# Patient Record
Sex: Male | Born: 1935
Health system: Southern US, Community
[De-identification: ages and names within clinical notes are randomized; demographics above are authoritative.]

## PROBLEM LIST (undated history)

## (undated) DIAGNOSIS — G459 Transient cerebral ischemic attack, unspecified: Secondary | ICD-10-CM

## (undated) DIAGNOSIS — E119 Type 2 diabetes mellitus without complications: Secondary | ICD-10-CM

## (undated) DIAGNOSIS — I639 Cerebral infarction, unspecified: Secondary | ICD-10-CM

## (undated) DIAGNOSIS — N289 Disorder of kidney and ureter, unspecified: Secondary | ICD-10-CM

## (undated) HISTORY — DX: Transient cerebral ischemic attack, unspecified: G45.9

## (undated) HISTORY — DX: Disorder of kidney and ureter, unspecified: N28.9

## (undated) HISTORY — DX: Cerebral infarction, unspecified: I63.9

---

## 2019-05-08 ENCOUNTER — Emergency Department (HOSPITAL_COMMUNITY): Payer: Medicare HMO

## 2019-05-08 ENCOUNTER — Other Ambulatory Visit: Payer: Self-pay

## 2019-05-08 ENCOUNTER — Inpatient Hospital Stay (HOSPITAL_COMMUNITY)
Admission: EM | Admit: 2019-05-08 | Discharge: 2019-05-10 | DRG: 683 | Disposition: A | Discharge status: Home / Self Care | Payer: Medicare HMO | Attending: Internal Medicine | Admitting: Internal Medicine

## 2019-05-08 ENCOUNTER — Observation Stay (HOSPITAL_COMMUNITY): Payer: Medicare HMO

## 2019-05-08 ENCOUNTER — Encounter (HOSPITAL_COMMUNITY): Payer: Self-pay

## 2019-05-08 DIAGNOSIS — N401 Enlarged prostate with lower urinary tract symptoms: Secondary | ICD-10-CM | POA: Diagnosis present

## 2019-05-08 DIAGNOSIS — N281 Cyst of kidney, acquired: Secondary | ICD-10-CM | POA: Diagnosis present

## 2019-05-08 DIAGNOSIS — E875 Hyperkalemia: Secondary | ICD-10-CM

## 2019-05-08 DIAGNOSIS — R531 Weakness: Secondary | ICD-10-CM | POA: Diagnosis not present

## 2019-05-08 DIAGNOSIS — Z7984 Long term (current) use of oral hypoglycemic drugs: Secondary | ICD-10-CM

## 2019-05-08 DIAGNOSIS — E1122 Type 2 diabetes mellitus with diabetic chronic kidney disease: Secondary | ICD-10-CM | POA: Diagnosis present

## 2019-05-08 DIAGNOSIS — Z9181 History of falling: Secondary | ICD-10-CM

## 2019-05-08 DIAGNOSIS — N179 Acute kidney failure, unspecified: Secondary | ICD-10-CM | POA: Diagnosis present

## 2019-05-08 DIAGNOSIS — Z79899 Other long term (current) drug therapy: Secondary | ICD-10-CM

## 2019-05-08 DIAGNOSIS — E871 Hypo-osmolality and hyponatremia: Secondary | ICD-10-CM | POA: Diagnosis present

## 2019-05-08 DIAGNOSIS — E872 Acidosis: Secondary | ICD-10-CM | POA: Diagnosis present

## 2019-05-08 DIAGNOSIS — Z841 Family history of disorders of kidney and ureter: Secondary | ICD-10-CM

## 2019-05-08 DIAGNOSIS — D631 Anemia in chronic kidney disease: Secondary | ICD-10-CM | POA: Diagnosis present

## 2019-05-08 DIAGNOSIS — Z20828 Contact with and (suspected) exposure to other viral communicable diseases: Secondary | ICD-10-CM | POA: Diagnosis present

## 2019-05-08 DIAGNOSIS — N184 Chronic kidney disease, stage 4 (severe): Secondary | ICD-10-CM | POA: Diagnosis present

## 2019-05-08 DIAGNOSIS — R3915 Urgency of urination: Secondary | ICD-10-CM | POA: Diagnosis present

## 2019-05-08 DIAGNOSIS — I129 Hypertensive chronic kidney disease with stage 1 through stage 4 chronic kidney disease, or unspecified chronic kidney disease: Secondary | ICD-10-CM | POA: Diagnosis present

## 2019-05-08 HISTORY — DX: Type 2 diabetes mellitus without complications: E11.9

## 2019-05-08 LAB — BASIC METABOLIC PANEL
Anion gap: 10 (ref 5–15)
Anion gap: 11 (ref 5–15)
BUN: 41 mg/dL — ABNORMAL HIGH (ref 8–23)
BUN: 39 mg/dL — ABNORMAL HIGH (ref 8–23)
CO2: 19 mmol/L — ABNORMAL LOW (ref 22–32)
CO2: 19 mmol/L — ABNORMAL LOW (ref 22–32)
Calcium: 9.4 mg/dL (ref 8.9–10.3)
Calcium: 9 mg/dL (ref 8.9–10.3)
Chloride: 101 mmol/L (ref 98–111)
Chloride: 100 mmol/L (ref 98–111)
Creatinine, Ser: 2.78 mg/dL — ABNORMAL HIGH (ref 0.61–1.24)
Creatinine, Ser: 2.75 mg/dL — ABNORMAL HIGH (ref 0.61–1.24)
GFR calc Af Amer: 23 mL/min — ABNORMAL LOW (ref >60)
GFR calc Af Amer: 24 mL/min — ABNORMAL LOW (ref >60)
GFR calc non Af Amer: 20 mL/min — ABNORMAL LOW (ref >60)
GFR calc non Af Amer: 20 mL/min — ABNORMAL LOW (ref >60)
Glucose, Bld: 92 mg/dL (ref 70–99)
Glucose, Bld: 127 mg/dL — ABNORMAL HIGH (ref 70–99)
Potassium: 6.9 mmol/L (ref 3.5–5.1)
Potassium: 6.1 mmol/L — ABNORMAL HIGH (ref 3.5–5.1)
Sodium: 130 mmol/L — ABNORMAL LOW (ref 135–145)
Sodium: 130 mmol/L — ABNORMAL LOW (ref 135–145)

## 2019-05-08 LAB — CBC WITH DIFFERENTIAL/PLATELET
Abs Immature Granulocytes: 0.04 10*3/uL (ref 0.00–0.07)
Basophils Absolute: 0 10*3/uL (ref 0.0–0.1)
Basophils Relative: 0 %
Eosinophils Absolute: 0 10*3/uL (ref 0.0–0.5)
Eosinophils Relative: 0 %
HCT: 33.6 % — ABNORMAL LOW (ref 39.0–52.0)
Hemoglobin: 10.7 g/dL — ABNORMAL LOW (ref 13.0–17.0)
Immature Granulocytes: 0 %
Lymphocytes Relative: 20 %
Lymphs Abs: 1.9 10*3/uL (ref 0.7–4.0)
MCH: 30.1 pg (ref 26.0–34.0)
MCHC: 31.8 g/dL (ref 30.0–36.0)
MCV: 94.4 fL (ref 80.0–100.0)
Monocytes Absolute: 0.8 10*3/uL (ref 0.1–1.0)
Monocytes Relative: 9 %
Neutro Abs: 6.5 10*3/uL (ref 1.7–7.7)
Neutrophils Relative %: 71 %
Platelets: 310 10*3/uL (ref 150–400)
RBC: 3.56 MIL/uL — ABNORMAL LOW (ref 4.22–5.81)
RDW: 13.4 % (ref 11.5–15.5)
WBC: 9.4 10*3/uL (ref 4.0–10.5)
nRBC: 0 % (ref 0.0–0.2)

## 2019-05-08 LAB — URINALYSIS, ROUTINE W REFLEX MICROSCOPIC
Bacteria, UA: NONE SEEN
Bilirubin Urine: NEGATIVE
Bilirubin Urine: NEGATIVE
Glucose, UA: NEGATIVE mg/dL
Glucose, UA: 150 mg/dL — AB
Hgb urine dipstick: NEGATIVE
Hgb urine dipstick: NEGATIVE
Ketones, ur: NEGATIVE mg/dL
Ketones, ur: NEGATIVE mg/dL
Leukocytes,Ua: NEGATIVE
Nitrite: NEGATIVE
Nitrite: NEGATIVE
Protein, ur: NEGATIVE mg/dL
Protein, ur: NEGATIVE mg/dL
Specific Gravity, Urine: 1.013 (ref 1.005–1.030)
Specific Gravity, Urine: 1.004 — ABNORMAL LOW (ref 1.005–1.030)
pH: 5 (ref 5.0–8.0)
pH: 7 (ref 5.0–8.0)

## 2019-05-08 LAB — COMPREHENSIVE METABOLIC PANEL
ALT: 17 U/L (ref 0–44)
AST: 18 U/L (ref 15–41)
Albumin: 4.4 g/dL (ref 3.5–5.0)
Alkaline Phosphatase: 50 U/L (ref 38–126)
Anion gap: 11 (ref 5–15)
BUN: 45 mg/dL — ABNORMAL HIGH (ref 8–23)
CO2: 20 mmol/L — ABNORMAL LOW (ref 22–32)
Calcium: 9.5 mg/dL (ref 8.9–10.3)
Chloride: 96 mmol/L — ABNORMAL LOW (ref 98–111)
Creatinine, Ser: 3.02 mg/dL — ABNORMAL HIGH (ref 0.61–1.24)
GFR calc Af Amer: 21 mL/min — ABNORMAL LOW (ref >60)
GFR calc non Af Amer: 18 mL/min — ABNORMAL LOW (ref >60)
Glucose, Bld: 109 mg/dL — ABNORMAL HIGH (ref 70–99)
Potassium: 7.3 mmol/L (ref 3.5–5.1)
Sodium: 127 mmol/L — ABNORMAL LOW (ref 135–145)
Total Bilirubin: 0.5 mg/dL (ref 0.3–1.2)
Total Protein: 7.3 g/dL (ref 6.5–8.1)

## 2019-05-08 LAB — HEMOGLOBIN A1C
Hgb A1c MFr Bld: 7.3 % — ABNORMAL HIGH (ref 4.8–5.6)
Mean Plasma Glucose: 162.81 mg/dL

## 2019-05-08 LAB — GLUCOSE, CAPILLARY
Glucose-Capillary: 66 mg/dL — ABNORMAL LOW (ref 70–99)
Glucose-Capillary: 85 mg/dL (ref 70–99)

## 2019-05-08 LAB — SODIUM, URINE, RANDOM: Sodium, Ur: 23 mmol/L

## 2019-05-08 LAB — CREATININE, URINE, RANDOM: Creatinine, Urine: 178.15 mg/dL

## 2019-05-08 LAB — OSMOLALITY, URINE: Osmolality, Ur: 190 mOsm/kg — ABNORMAL LOW (ref 300–900)

## 2019-05-08 LAB — SARS CORONAVIRUS 2 BY RT PCR (HOSPITAL ORDER, PERFORMED IN ~~LOC~~ HOSPITAL LAB): SARS Coronavirus 2: NEGATIVE

## 2019-05-08 LAB — CBG MONITORING, ED: Glucose-Capillary: 112 mg/dL — ABNORMAL HIGH (ref 70–99)

## 2019-05-08 IMAGING — CT CT HEAD WITHOUT CONTRAST
3 series · 15 of 47 positions shown, 18 images · non-contrast
Comparison: None.

CLINICAL DATA: Altered level of consciousness, dizzy, noticed by
daughter. No injury.

EXAM:
CT HEAD WITHOUT CONTRAST
TECHNIQUE: Contiguous axial images were obtained from the base of the skull
through the vertex without intravenous contrast.

[Series 2: head wo · axial · 0.47mm/px · z∈[+1534,+1664]mm · 9 of 32 slices shown, 12 images]
[im 3/32  brain]
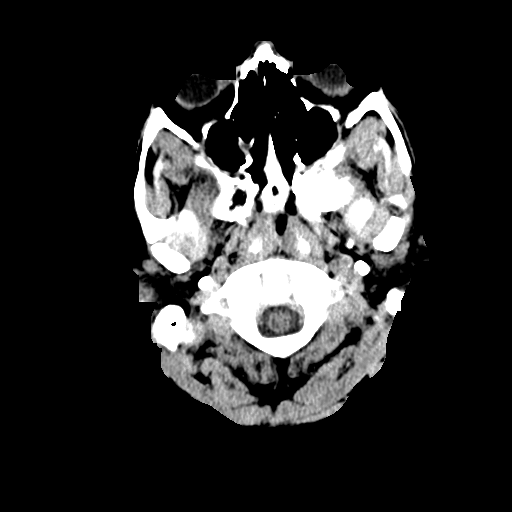
[im 3/32  bone]
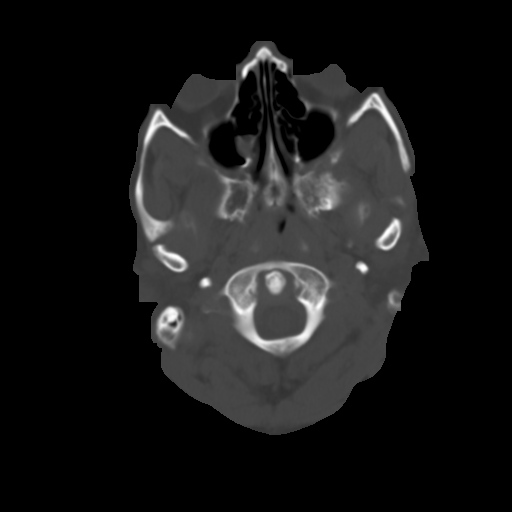
[im 6/32  brain]
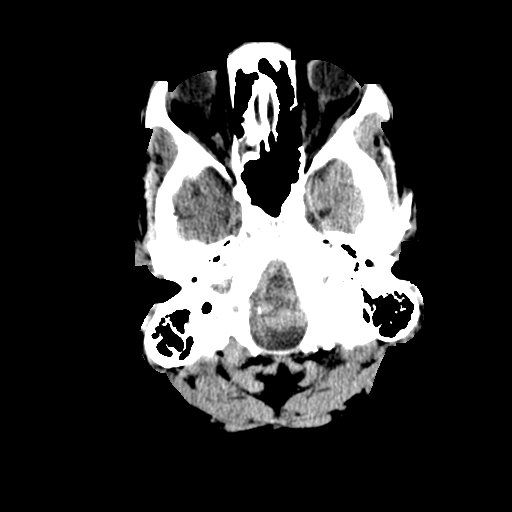
[im 9/32  brain]
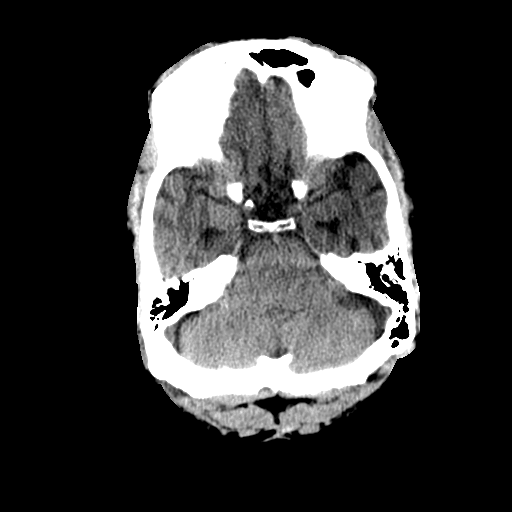
[im 12/32  brain]
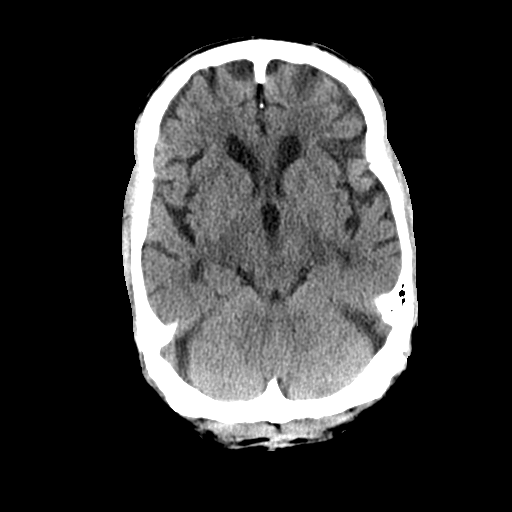
[im 17/32  brain]
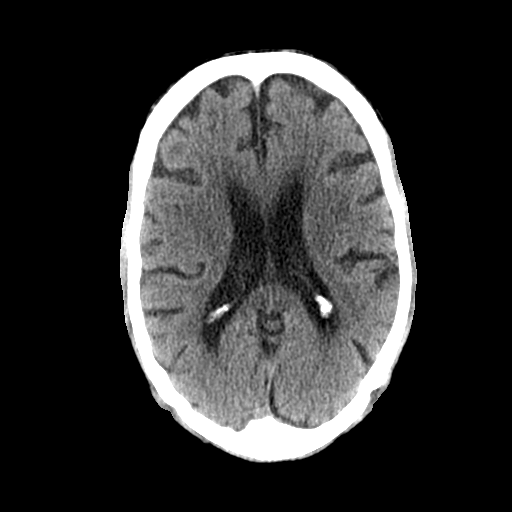
[im 17/32  bone]
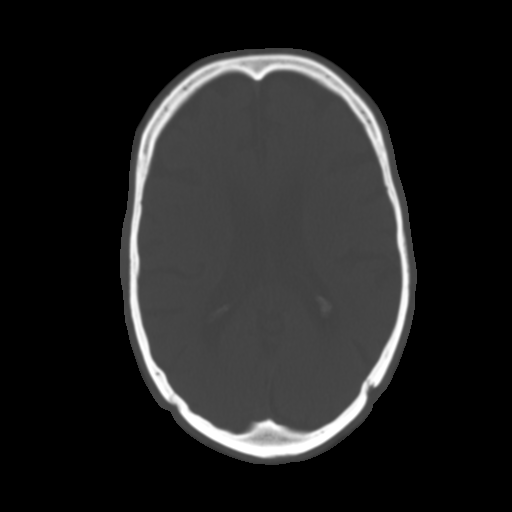
[im 20/32  brain]
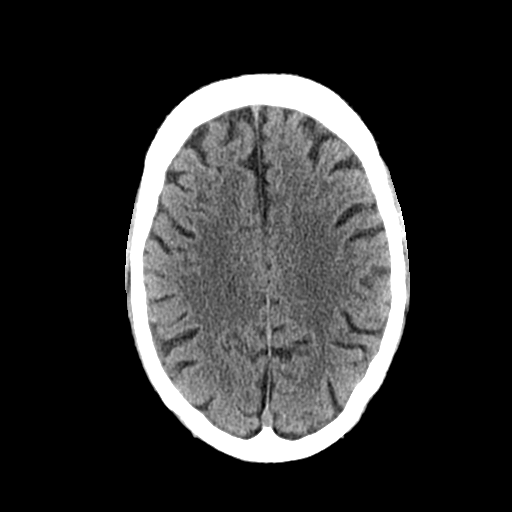
[im 23/32  brain]
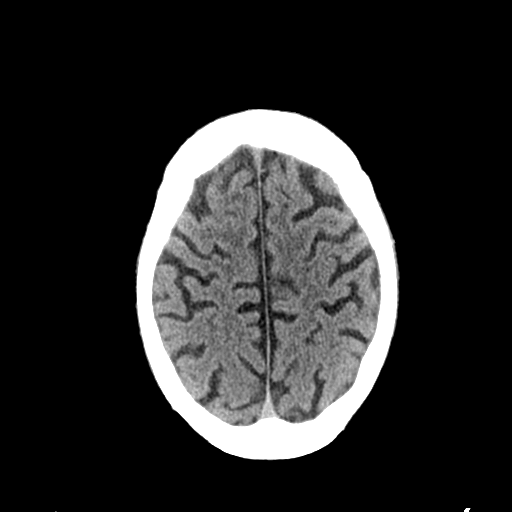
[im 26/32  brain]
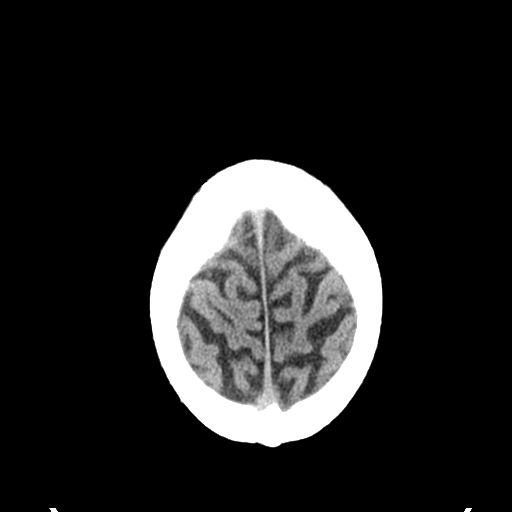
[im 29/32  brain]
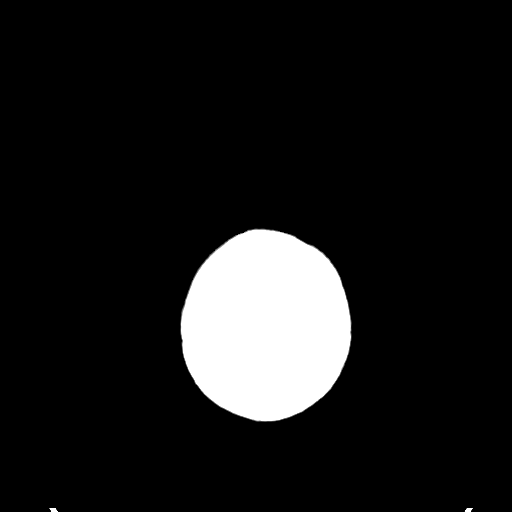
[im 29/32  bone]
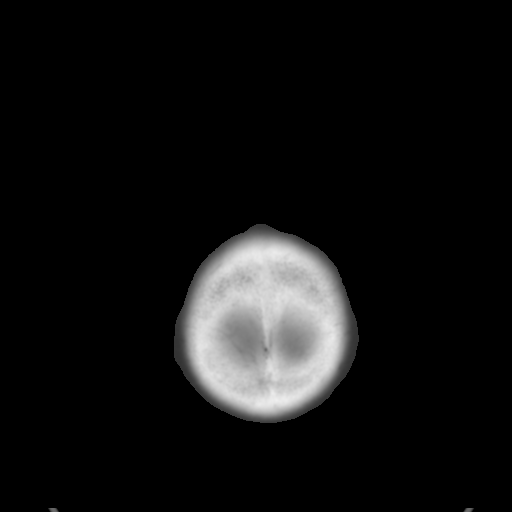

[Series 4: coronal soft tissue · coronal · 0.33mm/px · 3 of 81 slices shown]
[im 27/81  brain]
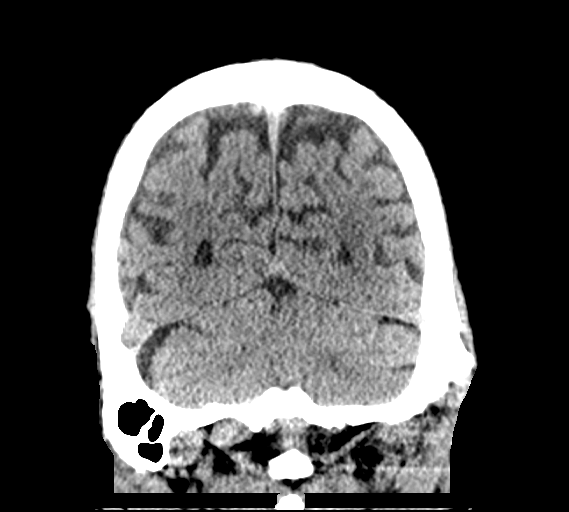
[im 36/81  brain]
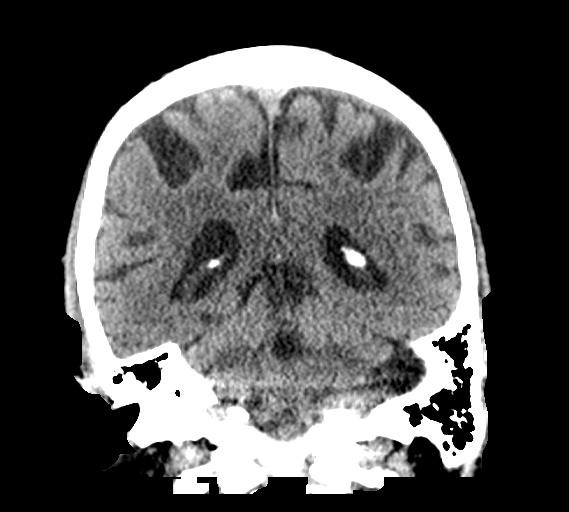
[im 45/81  brain]
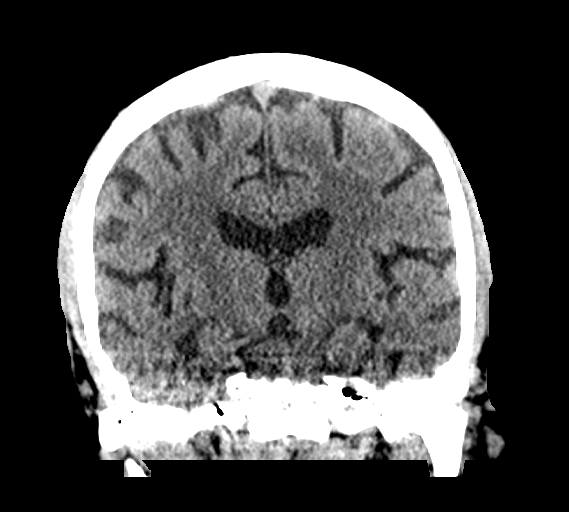

[Series 5: sagittal soft tissue · sagittal · 0.34mm/px · 3 of 58 slices shown]
[im 20/58  brain]
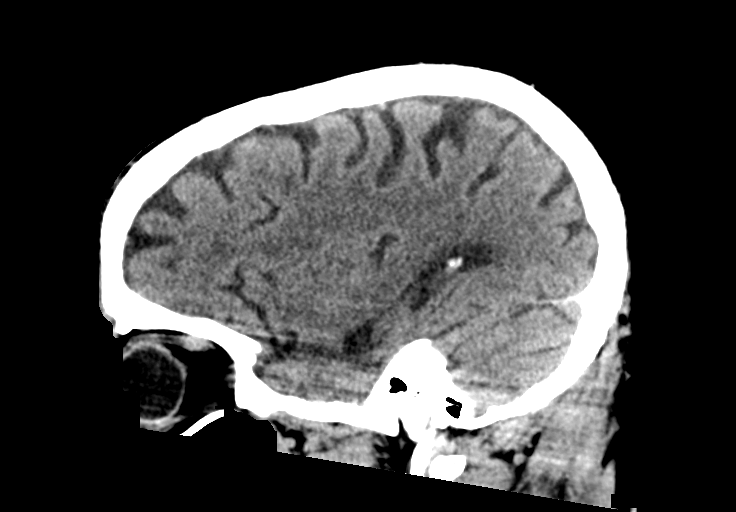
[im 29/58  brain]
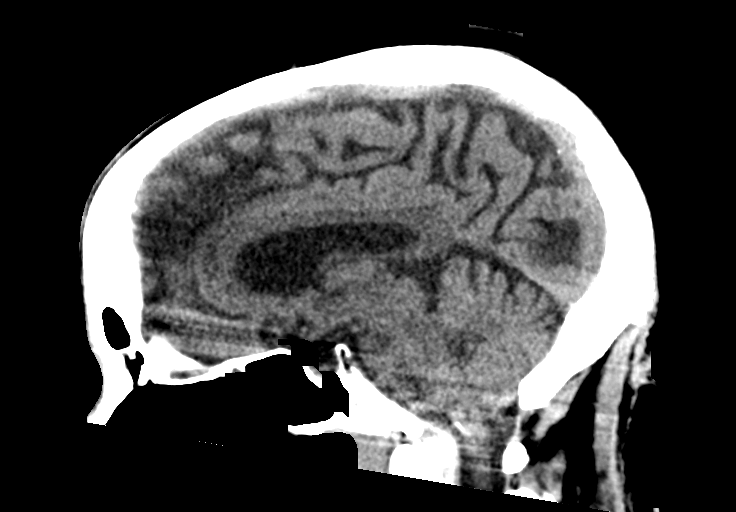
[im 39/58  brain]
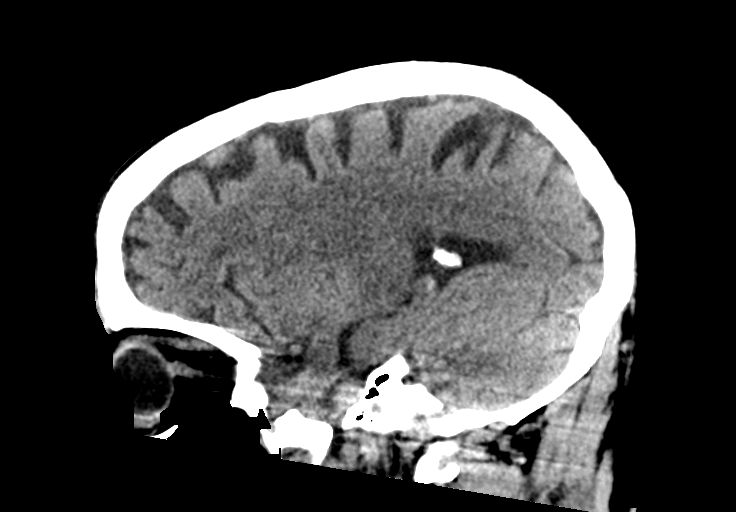

[15 of 47 positions shown; findings below may reference images not displayed]

FINDINGS: Brain: No evidence of acute infarction, hemorrhage, hydrocephalus,
extra-axial collection or mass lesion/mass effect. Patchy areas of
white matter hypoattenuation are most compatible with chronic
microvascular angiopathy. Symmetric prominence of the ventricles,
cisterns and sulci compatible with parenchymal volume loss.

Vascular: Atherosclerotic calcification of the carotid siphons and
intradural vertebral arteries.

Skull: No calvarial fracture or suspicious osseous lesion. No scalp
swelling or hematoma. Round soft tissue density in the posterior
midline scalp may reflect small dermal inclusion cyst.

Sinuses/Orbits: Minimal thickening of the right posterior ethmoids.
No air-fluid levels. Paranasal sinuses and mastoid air cells are
otherwise predominantly clear. Orbital structures are unremarkable.

Other: None
IMPRESSION: No acute intracranial abnormality.

Chronic white matter changes and volume loss.

Mild right ethmoidal sinus disease.

## 2019-05-08 IMAGING — US US RENAL
1 series · 14 of 25 positions shown · non-contrast
Comparison: None.

CLINICAL DATA: Acute renal failure

EXAM:
RENAL / URINARY TRACT ULTRASOUND COMPLETE

[Series 1: us renal · 14 of 53 slices shown]
[im 1/53]
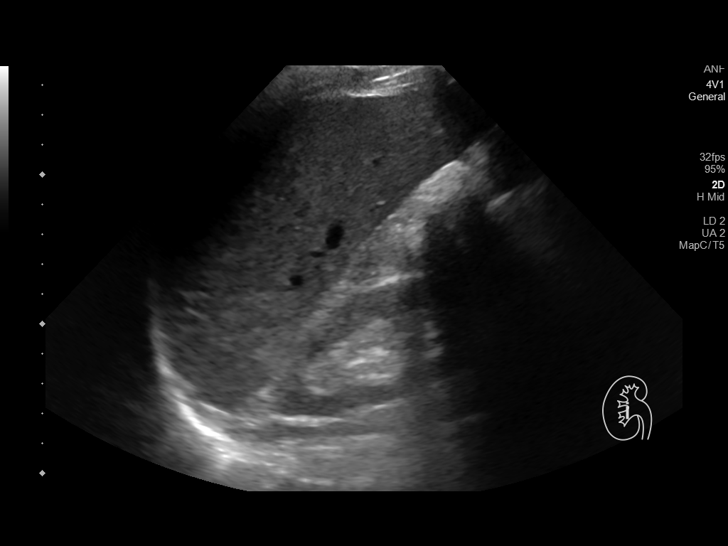
[im 5/53]
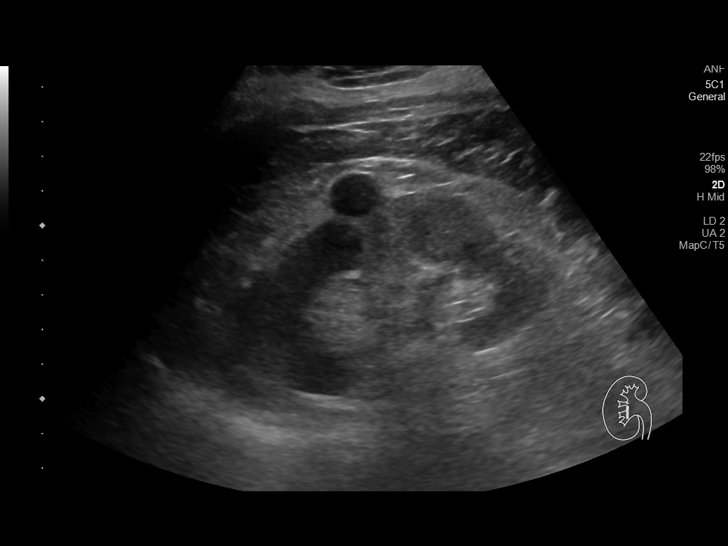
[im 9/53]
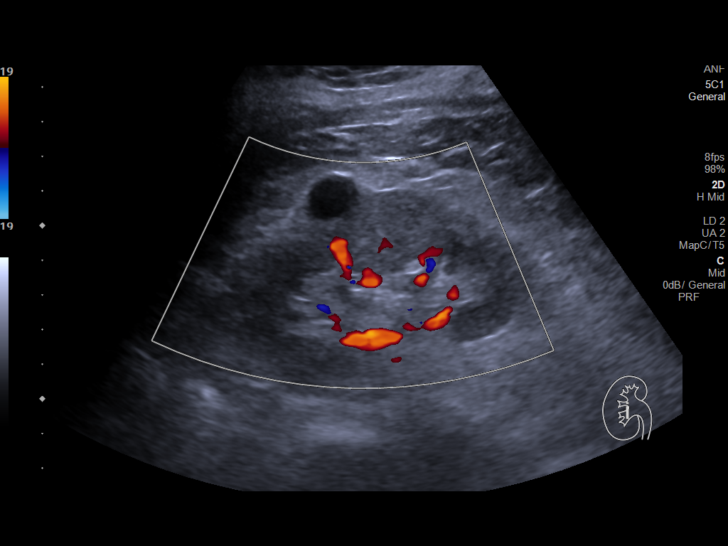
[im 14/53]
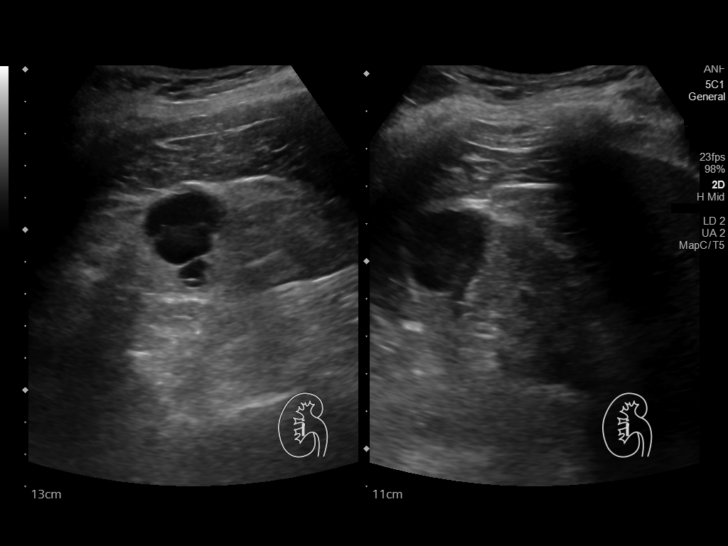
[im 18/53]
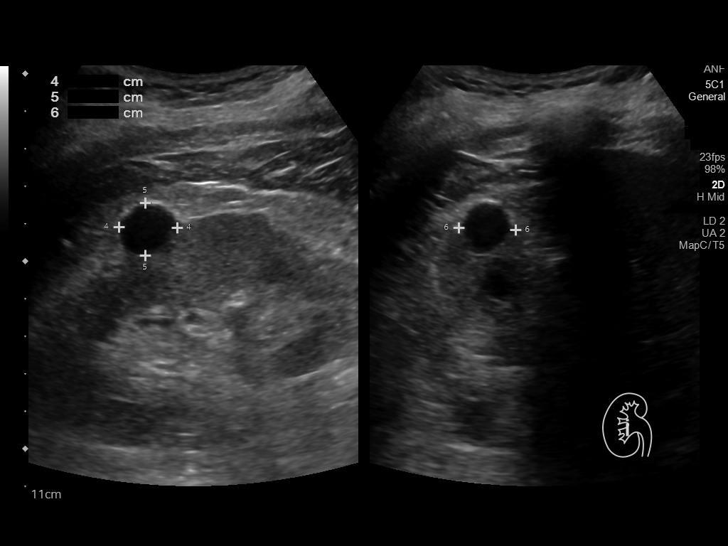
[im 20/53]
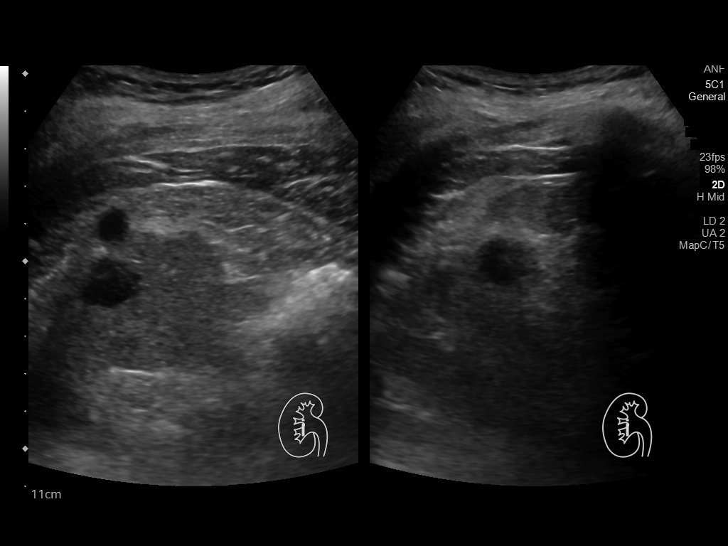
[im 24/53]
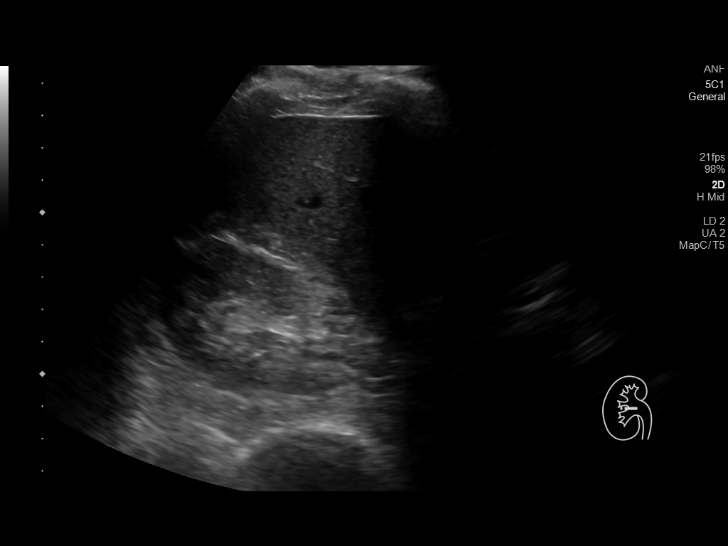
[im 29/53]
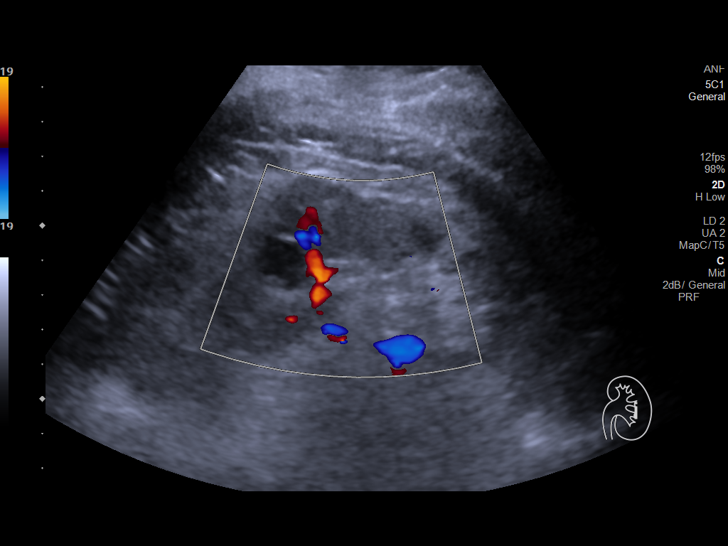
[im 33/53]
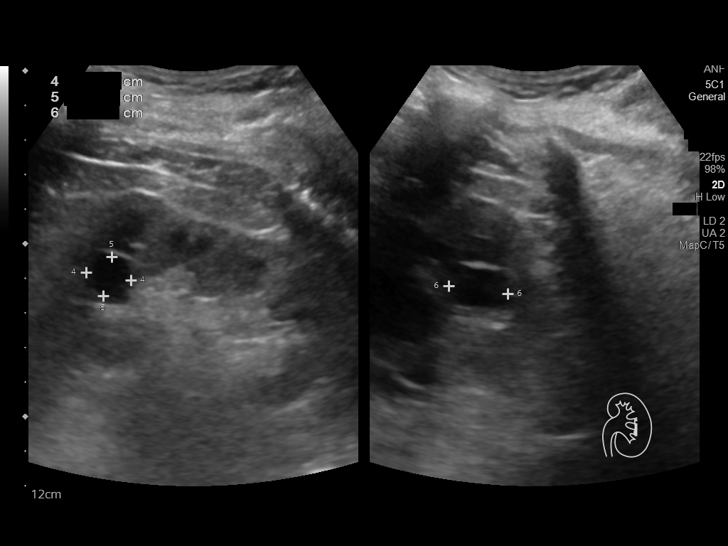
[im 35/53]
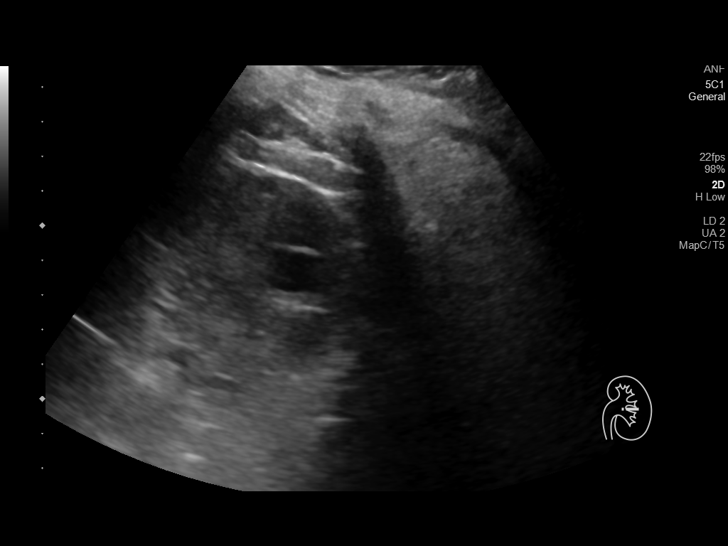
[im 40/53]
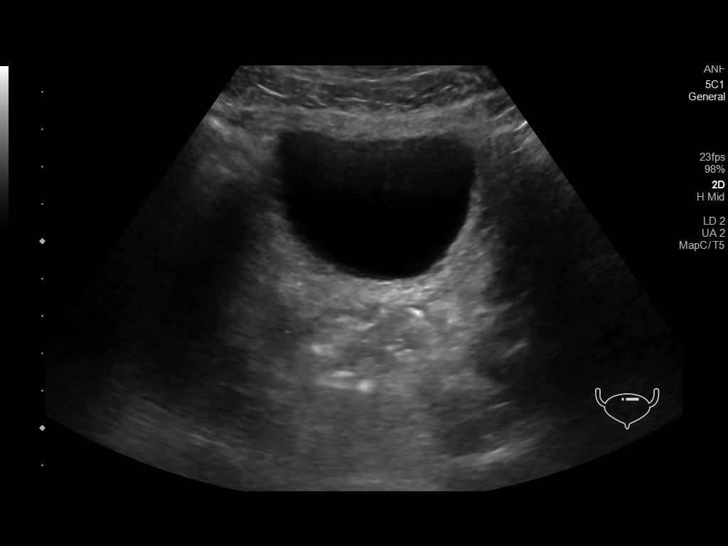
[im 44/53]
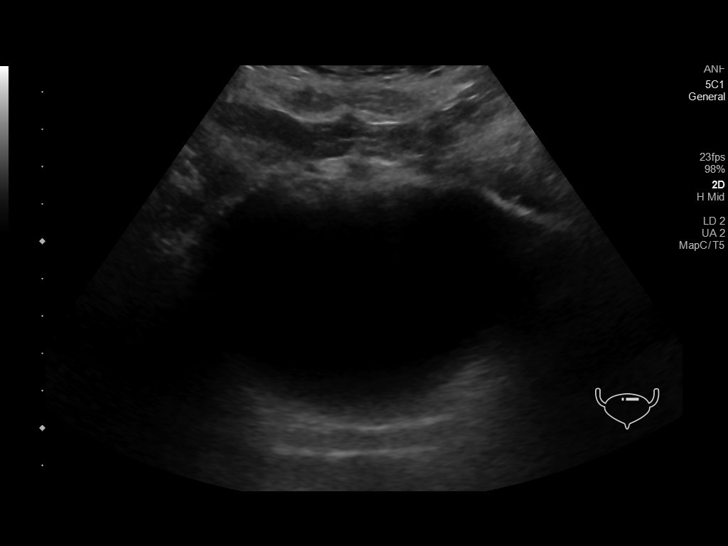
[im 48/53]
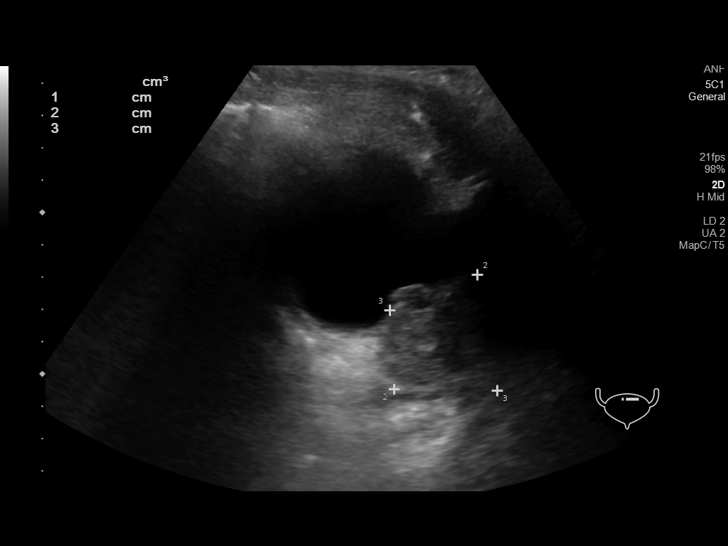
[im 53/53]
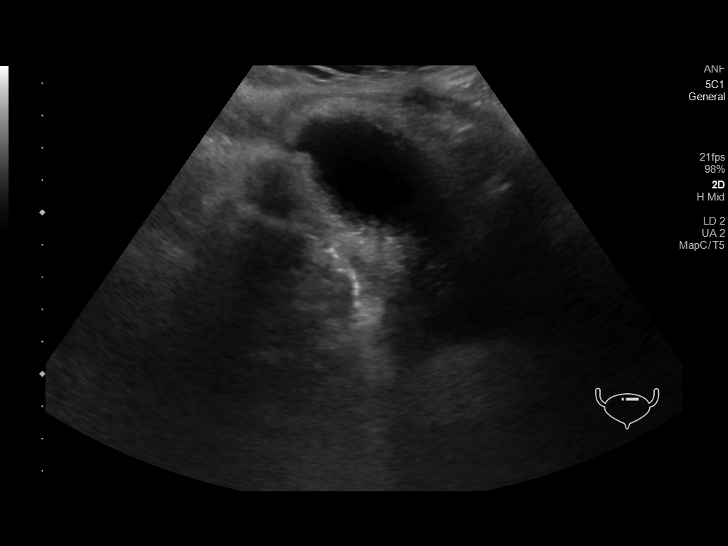

[14 of 25 positions shown; findings below may reference images not displayed]

FINDINGS: Right Kidney:

Renal measurements: 9.7 x 1.9 x 5.0 cm = volume: 125 mL. Increased
echotexture. At least 2 midpole cysts, the largest 2.6 cm. This cyst
appears septated. No hydronephrosis.

Left Kidney:

Renal measurements: 10.2 x 5.0 x 3.9 cm = volume: 103 mL. 1.7 cm
upper pole cyst. Increased echotexture. No hydronephrosis.

Bladder:

Appears normal for degree of bladder distention.
IMPRESSION: Increased echotexture compatible with chronic medical renal disease.

No hydronephrosis.

Bilateral renal cysts. The largest cyst on the right measures 2.6 cm
with internal septations. This likely reflects a benign complex
cyst. This could be followed with repeat ultrasound in 6 months to 1
year to ensure stability.

## 2019-05-08 MED ORDER — SODIUM BICARBONATE 8.4 % IV SOLN
50.0000 meq | Freq: Once | INTRAVENOUS | Status: AC
  Administered 2019-05-08: 50 meq via INTRAVENOUS
  Filled 2019-05-08: qty 50

## 2019-05-08 MED ORDER — INSULIN ASPART 100 UNIT/ML ~~LOC~~ SOLN
0.0000 [IU] | Freq: Three times a day (TID) | SUBCUTANEOUS | Status: DC
  Administered 2019-05-09 – 2019-05-10 (×2): 1 [IU] via SUBCUTANEOUS

## 2019-05-08 MED ORDER — AMLODIPINE BESYLATE 5 MG PO TABS
5.0000 mg | ORAL_TABLET | Freq: Every day | ORAL | Status: DC
  Administered 2019-05-09 – 2019-05-10 (×2): 5 mg via ORAL
  Filled 2019-05-08 (×2): qty 1

## 2019-05-08 MED ORDER — SODIUM CHLORIDE 0.9 % IV SOLN: 1.0000 g | Freq: Once | INTRAVENOUS | Status: DC

## 2019-05-08 MED ORDER — DEXTROSE 50 % IV SOLN
1.0000 | Freq: Once | INTRAVENOUS | Status: AC
  Administered 2019-05-08: 50 mL via INTRAVENOUS
  Filled 2019-05-08: qty 50

## 2019-05-08 MED ORDER — SODIUM BICARBONATE 650 MG PO TABS
650.0000 mg | ORAL_TABLET | Freq: Three times a day (TID) | ORAL | Status: DC
  Administered 2019-05-09 – 2019-05-10 (×5): 650 mg via ORAL
  Filled 2019-05-08 (×5): qty 1

## 2019-05-08 MED ORDER — CALCIUM GLUCONATE-NACL 1-0.675 GM/50ML-% IV SOLN
1.0000 g | Freq: Once | INTRAVENOUS | Status: AC
  Administered 2019-05-08: 1000 mg via INTRAVENOUS
  Filled 2019-05-08: qty 50

## 2019-05-08 MED ORDER — TAMSULOSIN HCL 0.4 MG PO CAPS
0.4000 mg | ORAL_CAPSULE | Freq: Every day | ORAL | Status: DC
  Administered 2019-05-08 – 2019-05-09 (×2): 0.4 mg via ORAL
  Filled 2019-05-08 (×2): qty 1

## 2019-05-08 MED ORDER — INSULIN ASPART 100 UNIT/ML ~~LOC~~ SOLN: 0.0000 [IU] | Freq: Every day | SUBCUTANEOUS | Status: DC

## 2019-05-08 MED ORDER — INSULIN ASPART 100 UNIT/ML IV SOLN
5.0000 [IU] | Freq: Once | INTRAVENOUS | Status: AC
  Administered 2019-05-08: 5 [IU] via INTRAVENOUS

## 2019-05-08 MED ORDER — ENOXAPARIN SODIUM 30 MG/0.3ML ~~LOC~~ SOLN: 30.0000 mg | SUBCUTANEOUS | Status: DC

## 2019-05-08 MED ORDER — SODIUM CHLORIDE 0.9 % IV SOLN
INTRAVENOUS | Status: DC
  Administered 2019-05-08 – 2019-05-10 (×6): via INTRAVENOUS

## 2019-05-08 MED ORDER — SODIUM POLYSTYRENE SULFONATE 15 GM/60ML PO SUSP
30.0000 g | Freq: Once | ORAL | Status: AC
  Administered 2019-05-08: 30 g via ORAL
  Filled 2019-05-08: qty 120

## 2019-05-08 MED ORDER — SODIUM ZIRCONIUM CYCLOSILICATE 10 G PO PACK
10.0000 g | PACK | Freq: Once | ORAL | Status: AC
  Administered 2019-05-08: 10 g via ORAL
  Filled 2019-05-08: qty 1

## 2019-05-08 NOTE — Consult Note (Signed)
[name] Renal Consultation Note  Requesting MD: Karleen Hampshire  Indication for Consultation:  Hyperkalemia and AKI vs. CKD   Chief complaint: fatigue  HPI: Erik Moss is a 85 y.o. male with a history of diabetes and BPH who presented to the ER with a week of fatigue.  He was noted to have hyperkalemia 7.3 and Cr 3.02.  Patient states he has felt tired and not himself.  Had been eating 1-2 oranges and 1 large glass of OJ daily up until a week ago when his new MD at [facility] directed him to stop.  Limited records available as he previously was living in [place] and came to [place] 2 months ago to live with his daughter.  He was seen about a week ago by Dr. Dayna Ramus with [facility] and was told that he had kidney failure - reported as stage IV with GFR under 20 and BUN 55 per consulting team's conversation with his daughter.  States that after first labs here a month ago he didn't hear anything about kidneys then last week's labs noted kidney failure.  I was not able to reach her on the phone x2.  Patient reports her number is [phone].  Previously the patient followed with his PCP regularly every three months and is not aware of any CKD at that time.  He last saw his PCP six months ago as he retired.  Dr. Era Skeen.  He states that he has a number for his nurse and he is going get this for [place]. He had switched to a Dr. Keitha Butte at [facility] but states had only seen in telemed and hadn't had labs in 6 months.  He reports that he is urinating frequently and has no issues with hesitation.  Nocturia which has worsened recently.  Has been on flomax and has enlarged prostate.  Food tastes normal, no n/v.  Has a sister who has started on HD within the past 6 months; thinks she has DM, too.  He denies recent NSAID use; reports remote infrequent use.  Outside labs from [place] are not available.   He received lokelma and bicarb, d50/insulin.  He was also initiated on IV  fluids - NS at 125 ml/hr.  Follow-up BMP with Cr improved from 3.02 to 2.75 and K now down to 6.1.  Bladder scan ordered and had 120 mL per nursing report.  Renal ultrasound demonstrates 9.7 cm right and 10.2 cm left kidneys.  No hydro with increased echotexture consistent with medical renal disease.  Cysts noted bilaterally, one with septations as below.   SARS coronavirus 2 negative.   Creatinine, Ser  Date/Time Value Ref Range Status  05/08/2019 08:26 PM 2.78 (H) 0.61 - 1.24 mg/dL Final  05/08/2019 03:28 PM 3.02 (H) 0.61 - 1.24 mg/dL Final   PMHx:   Past Medical History:  Diagnosis Date  . Diabetes mellitus without complication ([place])     Family Hx: ESRD - sister; who he thinks also has DM   Social History:  reports that he has never smoked. He has never used smokeless tobacco. He reports that he does not drink alcohol or use drugs.  Allergies: No Known Allergies  Medications: Prior to Admission medications   Medication Sig Start Date End Date Taking? Authorizing Provider  amLODipine (NORVASC) 5 MG tablet Take 5 mg by mouth daily.   Yes [provider]  ferrous gluconate (FERGON) 324 MG tablet Take 648 mg by mouth daily.    Yes [provider]  glimepiride (AMARYL) 1 MG tablet Take 1 mg by mouth daily with breakfast.   Yes [provider]  lisinopril (ZESTRIL) 5 MG tablet Take 5 mg by mouth daily.   Yes [provider]  Multiple Vitamin (MULTIVITAMIN WITH MINERALS) TABS tablet Take 1 tablet by mouth daily.   Yes [provider]  simvastatin (ZOCOR) 20 MG tablet Take 20 mg by mouth every evening.    Yes [provider]  sitaGLIPtin (JANUVIA) 25 MG tablet Take 25 mg by mouth daily.   Yes [provider]  tamsulosin (FLOMAX) 0.4 MG CAPS capsule Take 0.4 mg by mouth at bedtime.    Yes [provider]  triamcinolone ointment (KENALOG) 0.1 % Apply 1 application topically daily as needed (itching on lower legs.).     Yes [provider]    I have reviewed the patient's current medications.  Labs:  BMP Latest Ref Rng & Units 05/08/2019 05/08/2019  Glucose 70 - 99 mg/dL 92 109(H)  BUN 8 - 23 mg/dL 41(H) 45(H)  Creatinine 0.61 - 1.24 mg/dL 2.78(H) 3.02(H)  Sodium 135 - 145 mmol/L 130(L) 127(L)  Potassium 3.5 - 5.1 mmol/L 6.9(HH) 7.3(HH)  Chloride 98 - 111 mmol/L 101 96(L)  CO2 22 - 32 mmol/L 19(L) 20(L)  Calcium 8.9 - 10.3 mg/dL 9.4 9.5    Urinalysis    Component Value Date/Time   COLORURINE YELLOW 05/08/2019 1528   APPEARANCEUR CLEAR 05/08/2019 1528   LABSPEC 1.013 05/08/2019 1528   PHURINE 5.0 05/08/2019 1528   GLUCOSEU NEGATIVE 05/08/2019 1528   HGBUR NEGATIVE 05/08/2019 1528   [place] 05/08/2019 [place] 05/08/2019 1528   PROTEINUR NEGATIVE 05/08/2019 1528   NITRITE NEGATIVE 05/08/2019 1528   LEUKOCYTESUR TRACE (A) 05/08/2019 1528     ROS:  Pertinent items noted in HPI and remainder of comprehensive ROS otherwise negative.  Physical Exam: Vitals:   05/08/19 1756 05/08/19 2019  BP:  (!) 144/76  Pulse: (!) 104 84  Resp: 19 18  Temp:  98 F (36.7 C)  SpO2: 98% 100%     General: elderly male in bed NAD  HEENT: NCAT Eyes: EOMI sclera anicteric Neck: supple no JVD Heart: RRR ; no m/r/g Lungs:clear to auscultation; normal work of breathing at rest  Abdomen: soft/NT/ND normal bowel sounds Extremities: no edema appreciated; no cyanosis or clubbing Skin: no rash on extremities exposed  Neuro: alert and oriented x 3 provides history and follows commands GU - condom cath  Assessment/Plan:  # Hyperkalemia - Appreciate medical measures as above.  Has had a high potassium diet and has AKI and/or CKD and has been on lisinopril - contributing  - Repeat bicarbonate and ordered Kayexalate - Repeat BMP at 2230 - Discontinue lisinopril and would not resume on discharge  - modified to renal diet   # AKI  - Unknown chronicity as unknown baseline  Cr.  May have some degree of CKD.  GFR reported as under 20 one week ago. UA with negative protein.  No RBC assessment is listed.  Cannot rule out component of obstruction/retention given BPH but pt states voiding freely; no hydro on [place] and mildly elevated residual - Improving somewhat with hydration - will continue normal saline for now  - Strict ins/outs. - Repeat bladder scan and low threshold for foley   - Check up/cr ratio and repeat UA  - With concurrent anemia send SPEP, UPEP and free light chains  - requested labs from [place]   #  Metabolic acidosis - s/p sodium bicarbonate x 2  - Initiate standing bicarbonate supplement - will need to adjust   # Hyponatremia  - Improving with hydration   # Anemia  - May be secondary in part to AKI  - With concurrent AKI send SPEP, UPEP and free light chains   # BPH  - on flomax   # Bilateral renal cysts  - Complex renal cyst - the largest cyst with internal septations  - Likely benign per read and recommended for repeat renal ultrasound in 6-12 months  # DM  - Per primary team    Claudia Desanctis 05/08/2019, 10:21 PM

## 2019-05-08 NOTE — Progress Notes (Signed)
CRITICAL VALUE ALERT  Critical Value:  K: 6.9  Date & Time Notied:  05/08/2019 2111  Provider Notified: Dr. Royce Macadamia & Dr. Karleen Hampshire notified  Orders Received/Actions taken: new orders placed and followed.

## 2019-05-08 NOTE — ED Triage Notes (Signed)
Pt states that he has been more drowsy than normal. Pt also c/o some numbness to his arms.   Family stated that pt was having memory issues. Pt is completely A&Ox4. Pt is able to tell me FSBG and BP from this morning. Pt states that daughter is overly concerned.  Pt states he goes on the exercise machine daily, and takes all prescribed meds after.

## 2019-05-08 NOTE — ED Provider Notes (Signed)
[facility] DEPT Provider Note   CSN: [id number] Arrival date & time: 05/08/19  1454     History   Chief Complaint Chief Complaint  Patient presents with  . Fatigue    HPI Erik Moss is a 85 y.o. male.     This is a 85 year old male presents with fatigue times several days.  He denies any fever or chills.  No cough or congestion.  No vomiting or diarrhea.  No recent medication changes.  States that his weakness is throughout his entire body without focality.  Denies any headache or visual changes.  Has had some numbness to his arms but denies any neck pain.  Was able to exercise today without any problems.  States he did lose his balance yesterday when getting out of shower.  Does not use a cane or walker normally.  Has had some urinary frequency but denies any dysuria or hematuria.     Past Medical History:  Diagnosis Date  . Diabetes mellitus without complication ([place])     There are no active problems to display for this patient.   History reviewed. No pertinent surgical history.      Home Medications    Prior to Admission medications   Not on File    Family History No family history on file.  Social History Social History   Tobacco Use  . Smoking status: Never Smoker  . Smokeless tobacco: Never Used  Substance Use Topics  . Alcohol use: Never    Frequency: Never  . Drug use: Never     Allergies   Patient has no known allergies.   Review of Systems Review of Systems  All other systems reviewed and are negative.    Physical Exam Updated Vital Signs BP 119/78 (BP Location: Left Arm)   Pulse 70   Temp 98.7 F (37.1 C) (Oral)   Resp 17   Ht 1.676 m (5\' 6" )   Wt 68 kg   SpO2 100%   BMI 24.21 kg/m   Physical Exam Vitals signs and nursing note reviewed.  Constitutional:      General: He is not in acute distress.    Appearance: Normal appearance. He is well-developed. He is not toxic-appearing.   HENT:     Head: Normocephalic and atraumatic.  Eyes:     General: Lids are normal.     Conjunctiva/sclera: Conjunctivae normal.     Pupils: Pupils are equal, round, and reactive to light.  Neck:     Musculoskeletal: Normal range of motion and neck supple.     Thyroid: No thyroid mass.     Trachea: No tracheal deviation.  Cardiovascular:     Rate and Rhythm: Normal rate and regular rhythm.     Heart sounds: Normal heart sounds. No murmur. No gallop.   Pulmonary:     Effort: Pulmonary effort is normal. No respiratory distress.     Breath sounds: Normal breath sounds. No stridor. No decreased breath sounds, wheezing, rhonchi or rales.  Abdominal:     General: Bowel sounds are normal. There is no distension.     Palpations: Abdomen is soft.     Tenderness: There is no abdominal tenderness. There is no rebound.  Musculoskeletal: Normal range of motion.        General: No tenderness.  Skin:    General: Skin is warm and dry.     Findings: No abrasion or rash.  Neurological:     General: No focal deficit present.  Mental Status: He is alert and oriented to person, place, and time.     GCS: GCS eye subscore is 4. GCS verbal subscore is 5. GCS motor subscore is 6.     Cranial Nerves: No cranial nerve deficit.     Sensory: No sensory deficit.     Motor: No weakness or tremor.     Comments: Strength 5 out of 5 in all extremities  Psychiatric:        Speech: Speech normal.        Behavior: Behavior normal.      ED Treatments / Results  Labs (all labs ordered are listed, but only abnormal results are displayed) Labs Reviewed  CBG MONITORING, ED - Abnormal; Notable for the following components:      Result Value   Glucose-Capillary 112 (*)    All other components within normal limits  URINE CULTURE  URINALYSIS, ROUTINE W REFLEX MICROSCOPIC  CBC WITH DIFFERENTIAL/PLATELET  COMPREHENSIVE METABOLIC PANEL    EKG EKG Interpretation  Date/Time:  Wednesday May 08 2019 17:09:17  EDT Ventricular Rate:  92 PR Interval:    QRS Duration: 90 QT Interval:  334 QTC Calculation: 414 R Axis:   -133 Text Interpretation:  Sinus rhythm Prolonged PR interval Right axis deviation Low voltage, precordial leads Probable anteroseptal infarct, old Baseline wander in lead(s) II III aVF V1 No old tracing to compare Confirmed by Lacretia Leigh ([id number]) on 05/08/2019 5:25:28 PM   Radiology No results found.  Procedures Procedures (including critical care time)  Medications Ordered in ED Medications  0.9 %  sodium chloride infusion ( Intravenous New Bag/Given 05/08/19 1551)     Initial Impression / Assessment and Plan / ED Course  I have reviewed the triage vital signs and the nursing notes.  Pertinent labs & imaging results that were available during my care of the patient were reviewed by me and considered in my medical decision making (see chart for details).        Patient's home medicines reviewed and he is on lisinopril which could be part of the reason why he is in renal failure.  Patient's potassium as well as renal function noted.  He was given calcium as well as Lokelma.  Also given IV hydration here.  Will admit to the hospitalist service  CRITICAL CARE Performed by: Leota Jacobsen Total critical care time: 45 minutes Critical care time was exclusive of separately billable procedures and treating other patients. Critical care was necessary to treat or prevent imminent or life-threatening deterioration. Critical care was time spent personally by me on the following activities: development of treatment plan with patient and/or surrogate as well as nursing, discussions with consultants, evaluation of patient's response to treatment, examination of patient, obtaining history from patient or surrogate, ordering and performing treatments and interventions, ordering and review of laboratory studies, ordering and review of radiographic studies, pulse oximetry and  re-evaluation of patient's condition.   Final Clinical Impressions(s) / ED Diagnoses   Final diagnoses:  None    ED Discharge Orders    None       Lacretia Leigh, MD 05/08/19 1726

## 2019-05-08 NOTE — ED Notes (Signed)
ED TO INPATIENT HANDOFF REPORT  ED Nurse Name and Phone #: Donah Driver, RN [phone]  S Name/Age/Gender Erik Moss 85 y.o. male Room/Bed: WA10/WA10  Code Status   Code Status: Full Code  Home/SNF/Other Home Patient oriented to: self, place, time and situation Is this baseline? Yes   Triage Complete: Triage complete  Chief Complaint arm numbness; dizziness; recent fall; altered mental status  Triage Note Pt states that he has been more drowsy than normal. Pt also c/o some numbness to his arms.   Family stated that pt was having memory issues. Pt is completely A&Ox4. Pt is able to tell me FSBG and BP from this morning. Pt states that daughter is overly concerned.  Pt states he goes on the exercise machine daily, and takes all prescribed meds after.   Allergies No Known Allergies  Level of Care/Admitting Diagnosis ED Disposition    ED Disposition Condition Comment   Admit  Hospital Area: [facility] [[id number]]  Level of Care: Telemetry [5]  Admit to tele based on following criteria: Other see comments  Comments: acute hyperkalemia  Covid Evaluation: Asymptomatic Screening Protocol (No Symptoms)  Diagnosis: ARF (acute renal failure) ([place]) [633354]  Admitting Physician: Hosie Poisson [[id number]]  Attending Physician: Hosie Poisson [[id number]]  PT Class (Do Not Modify): Observation [104]  PT Acc Code (Do Not Modify): Observation [10022]       B Medical/Surgery History Past Medical History:  Diagnosis Date  . Diabetes mellitus without complication ([place])    History reviewed. No pertinent surgical history.   A IV Location/Drains/Wounds Patient Lines/Drains/Airways Status   Active Line/Drains/Airways    Name:   Placement date:   Placement time:   Site:   Days:   Peripheral IV 05/08/19 Right Forearm   05/08/19    1550    Forearm   less than 1          Intake/Output Last 24 hours No intake or output data in the 24 hours ending 05/08/19  1818  Labs/Imaging Results for orders placed or performed during the hospital encounter of 05/08/19 (from the past 48 hour(s))  CBG monitoring, ED     Status: Abnormal   Collection Time: 05/08/19  3:06 PM  Result Value Ref Range   Glucose-Capillary 112 (H) 70 - 99 mg/dL  Urinalysis, Routine w reflex microscopic     Status: Abnormal   Collection Time: 05/08/19  3:28 PM  Result Value Ref Range   Color, Urine YELLOW YELLOW   APPearance CLEAR CLEAR   Specific Gravity, Urine 1.013 1.005 - 1.030   pH 5.0 5.0 - 8.0   Glucose, UA NEGATIVE NEGATIVE mg/dL   Hgb urine dipstick NEGATIVE NEGATIVE   Bilirubin Urine NEGATIVE NEGATIVE   Ketones, ur NEGATIVE NEGATIVE mg/dL   Protein, ur NEGATIVE NEGATIVE mg/dL   Nitrite NEGATIVE NEGATIVE   Leukocytes,Ua TRACE (A) NEGATIVE   WBC, UA 6-10 0 - 5 WBC/hpf   Bacteria, UA NONE SEEN NONE SEEN   Hyaline Casts, UA PRESENT     Comment: Performed at [facility], [place] [address], [place], [place] [address]  CBC with Differential/Platelet     Status: Abnormal   Collection Time: 05/08/19  3:28 PM  Result Value Ref Range   WBC 9.4 4.0 - 10.5 K/uL   RBC 3.56 (L) 4.22 - 5.81 MIL/uL   Hemoglobin 10.7 (L) 13.0 - 17.0 g/dL   HCT 33.6 (L) 39.0 - 52.0 %   MCV 94.4 80.0 - 100.0 fL  MCH 30.1 26.0 - 34.0 pg   MCHC 31.8 30.0 - 36.0 g/dL   RDW 13.4 11.5 - 15.5 %   Platelets 310 150 - 400 K/uL   nRBC 0.0 0.0 - 0.2 %   Neutrophils Relative % 71 %   Neutro Abs 6.5 1.7 - 7.7 K/uL   Lymphocytes Relative 20 %   Lymphs Abs 1.9 0.7 - 4.0 K/uL   Monocytes Relative 9 %   Monocytes Absolute 0.8 0.1 - 1.0 K/uL   Eosinophils Relative 0 %   Eosinophils Absolute 0.0 0.0 - 0.5 K/uL   Basophils Relative 0 %   Basophils Absolute 0.0 0.0 - 0.1 K/uL   Immature Granulocytes 0 %   Abs Immature Granulocytes 0.04 0.00 - 0.07 K/uL    Comment: Performed at [facility], [place] [address], [place], [place] [address]  Comprehensive metabolic panel      Status: Abnormal   Collection Time: 05/08/19  3:28 PM  Result Value Ref Range   Sodium 127 (L) 135 - 145 mmol/L   Potassium 7.3 (HH) 3.5 - 5.1 mmol/L    Comment: NO VISIBLE HEMOLYSIS CRITICAL RESULT CALLED TO, READ BACK BY AND VERIFIED WITH: Ethelda Chick [id number] @ 1627 BY J SCOTTON    Chloride 96 (L) 98 - 111 mmol/L   CO2 20 (L) 22 - 32 mmol/L   Glucose, Bld 109 (H) 70 - 99 mg/dL   BUN 45 (H) 8 - 23 mg/dL   Creatinine, Ser 3.02 (H) 0.61 - 1.24 mg/dL   Calcium 9.5 8.9 - 10.3 mg/dL   Total Protein 7.3 6.5 - 8.1 g/dL   Albumin 4.4 3.5 - 5.0 g/dL   AST 18 15 - 41 U/L   ALT 17 0 - 44 U/L   Alkaline Phosphatase 50 38 - 126 U/L   Total Bilirubin 0.5 0.3 - 1.2 mg/dL   GFR calc non Af Amer 18 (L) >60 mL/min   GFR calc Af Amer 21 (L) >60 mL/min   Anion gap 11 5 - 15    Comment: Performed at [facility], [place] [address], [place], [place] [address]   Ct Head Wo Contrast  Result Date: 05/08/2019 CLINICAL DATA:  Altered level of consciousness, dizzy, noticed by daughter. No injury. EXAM: CT HEAD WITHOUT CONTRAST TECHNIQUE: Contiguous axial images were obtained from the base of the skull through the vertex without intravenous contrast. COMPARISON:  None. FINDINGS: Brain: No evidence of acute infarction, hemorrhage, hydrocephalus, extra-axial collection or mass lesion/mass effect. Patchy areas of white matter hypoattenuation are most compatible with chronic microvascular angiopathy. Symmetric prominence of the ventricles, cisterns and sulci compatible with parenchymal volume loss. Vascular: Atherosclerotic calcification of the carotid siphons and intradural vertebral arteries. Skull: No calvarial fracture or suspicious osseous lesion. No scalp swelling or hematoma. Round soft tissue density in the posterior midline scalp may reflect small dermal inclusion cyst. Sinuses/Orbits: Minimal thickening of the right posterior ethmoids. No air-fluid levels. Paranasal sinuses and mastoid air  cells are otherwise predominantly clear. Orbital structures are unremarkable. Other: None IMPRESSION: No acute intracranial abnormality. Chronic white matter changes and volume loss. Mild right ethmoidal sinus disease. Electronically Signed   By: Lovena Le M.D.   On: 05/08/2019 17:09    Pending Labs Unresulted Labs (From admission, onward)    Start     Ordered   05/15/19 0500  Creatinine, serum  (enoxaparin (LOVENOX)    CrCl < 30 ml/min)  Weekly,   R    Comments:  while on enoxaparin therapy.    05/08/19 1815   05/08/19 1818  Sodium, urine, random  Add-on,   AD     05/08/19 1817   05/08/19 1817  Creatinine, urine, random  Add-on,   AD     05/08/19 1817   05/08/19 1817  Hemoglobin A1c  Add-on,   AD    Comments: To assess prior glycemic control    05/08/19 1817   05/08/19 1727  SARS Coronavirus 2 (CEPHEID - Performed in [facility] hospital lab), Hosp Order  (Asymptomatic Patients Labs)  Once,   STAT    Question:  Rule Out  Answer:  Yes   05/08/19 1726   05/08/19 1528  Urine Culture  ONCE - STAT,   STAT     05/08/19 1527          Vitals/Pain Today's Vitals   05/08/19 1725 05/08/19 1730 05/08/19 1745 05/08/19 1756  BP:  (!) 171/71    Pulse: 89 92 83 (!) 104  Resp: (!) 22 17 20 19   Temp:      TempSrc:      SpO2: 100% 100% 99% 98%  Weight:      Height:      PainSc:        Isolation Precautions No active isolations  Medications Medications  0.9 %  sodium chloride infusion ( Intravenous Transfusing/Transfer 05/08/19 1815)  amLODipine (NORVASC) tablet 5 mg (has no administration in time range)  tamsulosin (FLOMAX) capsule 0.4 mg (has no administration in time range)  enoxaparin (LOVENOX) injection 30 mg (has no administration in time range)  insulin aspart (novoLOG) injection 0-9 Units (has no administration in time range)  insulin aspart (novoLOG) injection 0-5 Units (has no administration in time range)  sodium zirconium cyclosilicate (LOKELMA) packet 10 g (10 g Oral  Given 05/08/19 1724)  calcium gluconate 1 g/ 50 mL sodium chloride IVPB (0 g Intravenous Stopped 05/08/19 1800)    Mobility walks Low fall risk   Focused Assessments        R Recommendations: See Admitting Provider Note  Report given to:   Additional Notes:

## 2019-05-08 NOTE — H&P (Signed)
History and Physical    Erik Moss WRU:[id number] DOB: 1933/02/22 DOA: 05/08/2019  PCP: [name], FNP  Patient coming from: Home  I have personally briefly reviewed patient's old medical records in [place]  Chief Complaint: generalized weakness since few days.   HPI: Erik Moss is a 85 y.o. male with medical history significant of hypertension, type 2 DM, ? Stage 4 CKD, presents to ED for generalized weakness over the last 2 to 3 days. [place] is from [place], came to live with his daughter in [place] 2 months ago. He reports seeing Dr Dayna Ramus one week ago for general check up and was told he had stage 4 CKD and was referred to [facility].  Pt also reports falling in the bathroom yesterday without any presyncope or syncope. He reports having some loose stools from taking laxatives. No fever or chills, no nausea, vomiting or abdominal pain. No headaches or dizziness. No sob, chest pain or fevers or chills. He was referred to [facility] for AKI and hyperkalemia.    ED Course: on arrival to ED, pt was hypertensive, afebrile and not in distress. Labs revealed sodium of 127, potassium of 7.3, non hemolyzed sample , BUN of 45, creatinine of 3.02 bicarb of 20, wbc count of 9.4, and hemoglobin of 10.7. his UA SHOWED TRACE leukocytes, neg nitrite and no bacteria. His hemoglobin A1c is 7.3 . His COVID 19 screen was negative. His urine sodium is 23 and urine creatinine is 178.     Review of Systems: As per HPI otherwise 10 point review of systems negative.   Past Medical History:  Diagnosis Date  . Diabetes mellitus without complication ([place])     History reviewed. No pertinent surgical history.   reports that he has never smoked. He has never used smokeless tobacco. He reports that he does not drink alcohol or use drugs.  No Known Allergies  Family history of CKD and ESRD present.  Prior to Admission medications   Medication Sig Start Date End Date Taking?  Authorizing Provider  amLODipine (NORVASC) 5 MG tablet Take 5 mg by mouth daily.   Yes [provider]  ferrous gluconate (FERGON) 324 MG tablet Take 648 mg by mouth daily.    Yes [provider]  glimepiride (AMARYL) 1 MG tablet Take 1 mg by mouth daily with breakfast.   Yes [provider]  lisinopril (ZESTRIL) 5 MG tablet Take 5 mg by mouth daily.   Yes [provider]  Multiple Vitamin (MULTIVITAMIN WITH MINERALS) TABS tablet Take 1 tablet by mouth daily.   Yes [provider]  simvastatin (ZOCOR) 20 MG tablet Take 20 mg by mouth every evening.    Yes [provider]  sitaGLIPtin (JANUVIA) 25 MG tablet Take 25 mg by mouth daily.   Yes [provider]  tamsulosin (FLOMAX) 0.4 MG CAPS capsule Take 0.4 mg by mouth at bedtime.    Yes [provider]  triamcinolone ointment (KENALOG) 0.1 % Apply 1 application topically daily as needed (itching on lower legs.).    Yes [provider]    Physical Exam: Vitals:   05/08/19 1745 05/08/19 1756 05/08/19 2000 05/08/19 2019  BP:    (!) 144/76  Pulse: 83 (!) 104  84  Resp: 20 19  18   Temp:    98 F (36.7 C)  TempSrc:    Oral  SpO2: 99% 98%  100%  Weight:   67.9 kg   Height:  5\' 6"  (1.676 m)     Constitutional: NAD, calm, comfortable Vitals:   05/08/19 1745 05/08/19 1756 05/08/19 2000 05/08/19 2019  BP:    (!) 144/76  Pulse: 83 (!) 104  84  Resp: 20 19  18   Temp:    98 F (36.7 C)  TempSrc:    Oral  SpO2: 99% 98%  100%  Weight:   67.9 kg   Height:   5\' 6"  (1.676 m)    Eyes: PERRL, lids and conjunctivae normal ENMT: Mucous membranes are dry Neck: normal, supple, no masses, no thyromegaly Respiratory: clear to auscultation bilaterally, no wheezing,Normal respiratory effort.  Cardiovascular: Regular rate and rhythm, no murmurs / rubs / gallops. No extremity edema. 2+ pedal pulses. No carotid bruits.  Abdomen: no tenderness, no masses palpated. No  hepatosplenomegaly. Bowel sounds positive.  Musculoskeletal: no clubbing / cyanosis.  Skin: no rashes, lesions, ulcers. No induration Neurologic: CN 2-12 grossly intact. Sensation intact, DTR normal. Strength 5/5 in all 4.  Psychiatric: Normal judgment and insight. Alert and oriented x 3. Normal mood.     Labs on Admission: I have personally reviewed following labs and imaging studies  CBC: Recent Labs  Lab 05/08/19 1528  WBC 9.4  NEUTROABS 6.5  HGB 10.7*  HCT 33.6*  MCV 94.4  PLT 376   Basic Metabolic Panel: Recent Labs  Lab 05/08/19 1528 05/08/19 2026  NA 127* 130*  K 7.3* 6.9*  CL 96* 101  CO2 20* 19*  GLUCOSE 109* 92  BUN 45* 41*  CREATININE 3.02* 2.78*  CALCIUM 9.5 9.4   GFR: Estimated Creatinine Clearance: 18.2 mL/min (A) (by C-G formula based on SCr of 2.78 mg/dL (H)). Liver Function Tests: Recent Labs  Lab 05/08/19 1528  AST 18  ALT 17  ALKPHOS 50  BILITOT 0.5  PROT 7.3  ALBUMIN 4.4   No results for input(s): LIPASE, AMYLASE in the last 168 hours. No results for input(s): AMMONIA in the last 168 hours. Coagulation Profile: No results for input(s): INR, PROTIME in the last 168 hours. Cardiac Enzymes: No results for input(s): CKTOTAL, CKMB, CKMBINDEX, TROPONINI in the last 168 hours. BNP (last 3 results) No results for input(s): PROBNP in the last 8760 hours. HbA1C: Recent Labs    05/08/19 2026  HGBA1C 7.3*   CBG: Recent Labs  Lab 05/08/19 1506  GLUCAP 112*   Lipid Profile: No results for input(s): CHOL, HDL, LDLCALC, TRIG, CHOLHDL, LDLDIRECT in the last 72 hours. Thyroid Function Tests: No results for input(s): TSH, T4TOTAL, FREET4, T3FREE, THYROIDAB in the last 72 hours. Anemia Panel: No results for input(s): VITAMINB12, FOLATE, FERRITIN, TIBC, IRON, RETICCTPCT in the last 72 hours. Urine analysis:    Component Value Date/Time   COLORURINE YELLOW 05/08/2019 1528   APPEARANCEUR CLEAR 05/08/2019 1528   LABSPEC 1.013 05/08/2019 1528    PHURINE 5.0 05/08/2019 1528   GLUCOSEU NEGATIVE 05/08/2019 1528   HGBUR NEGATIVE 05/08/2019 [place] 05/08/2019 [place] 05/08/2019 1528   PROTEINUR NEGATIVE 05/08/2019 1528   NITRITE NEGATIVE 05/08/2019 1528   LEUKOCYTESUR TRACE (A) 05/08/2019 1528    Radiological Exams on Admission: Ct Head Wo Contrast  Result Date: 05/08/2019 CLINICAL DATA:  Altered level of consciousness, dizzy, noticed by daughter. No injury. EXAM: CT HEAD WITHOUT CONTRAST TECHNIQUE: Contiguous axial images were obtained from the base of the skull through the vertex without intravenous contrast. COMPARISON:  None. FINDINGS: Brain: No evidence of acute infarction, hemorrhage, hydrocephalus, extra-axial collection or  mass lesion/mass effect. Patchy areas of white matter hypoattenuation are most compatible with chronic microvascular angiopathy. Symmetric prominence of the ventricles, cisterns and sulci compatible with parenchymal volume loss. Vascular: Atherosclerotic calcification of the carotid siphons and intradural vertebral arteries. Skull: No calvarial fracture or suspicious osseous lesion. No scalp swelling or hematoma. Round soft tissue density in the posterior midline scalp may reflect small dermal inclusion cyst. Sinuses/Orbits: Minimal thickening of the right posterior ethmoids. No air-fluid levels. Paranasal sinuses and mastoid air cells are otherwise predominantly clear. Orbital structures are unremarkable. Other: None IMPRESSION: No acute intracranial abnormality. Chronic white matter changes and volume loss. Mild right ethmoidal sinus disease. Electronically Signed   By: Lovena Le M.D.   On: 05/08/2019 17:09   US Renal  Result Date: 05/08/2019 CLINICAL DATA:  Acute renal failure EXAM: RENAL / URINARY TRACT ULTRASOUND COMPLETE COMPARISON:  None. FINDINGS: Right Kidney: Renal measurements: 9.7 x 1.9 x 5.0 cm = volume: 125 mL. Increased echotexture. At least 2 midpole cysts, the  largest 2.6 cm. This cyst appears septated. No hydronephrosis. Left Kidney: Renal measurements: 10.2 x 5.0 x 3.9 cm = volume: 103 mL. 1.7 cm upper pole cyst. Increased echotexture. No hydronephrosis. Bladder: Appears normal for degree of bladder distention. IMPRESSION: Increased echotexture compatible with chronic medical renal disease. No hydronephrosis. Bilateral renal cysts. The largest cyst on the right measures 2.6 cm with internal septations. This likely reflects a benign complex cyst. This could be followed with repeat ultrasound in 6 months to 1 year to ensure stability. Electronically Signed   By: Rolm Baptise M.D.   On: 05/08/2019 21:32    EKG: Independently reviewed. Sinus with peaked T waves.  Assessment/Plan Active Problems:   ARF (acute renal failure) ([facility])   Acute on stage 4 CKD with hyperkalemia and non anion gap metabolic acidosis: Admit to telemetry.  One dose of calcium gluconate given , along with lokelma.  NS at 130ml/hr ordered.  Insulin and dextrose 10% vial along with one dose of sodium bicarbonate ordered.  Nephrology consulted and BMP is repeated and pending.  EKG shows peaked t waves, but pt is asymptomatic.   Type 2 DM: A1c is 7.3 Resume SSI.    Essential Hypertension;  - sub optimally controlled.  Restarted him on norvasc 5 mg daily.    BPH:  Resume flomax.    Severity of Illness: The appropriate patient status for this patient is OBSERVATION. Observation status is judged to be reasonable and necessary in order to provide the required intensity of service to ensure the patient's safety. The patient's presenting symptoms, physical exam findings, and initial radiographic and laboratory data in the context of their medical condition is felt to place them at decreased risk for further clinical deterioration. Furthermore, it is anticipated that the patient will be medically stable for discharge from the hospital within 2 midnights of admission. The following  factors support the patient status of observation.        DVT prophylaxis: scd's Code Status: full code.  Family Communication: discussed with daughter over the phone.  Disposition Plan: pending clinical improvement.  Consults called: nephrology Dr Royce Macadamia. Admission status: observation.    Hosie Poisson MD [facility] Pager [phone]  If 7PM-7AM, please contact night-coverage [email or web address] Password [facility]  05/08/2019, 9:39 PM

## 2019-05-08 NOTE — ED Notes (Signed)
Patient transported to CT 

## 2019-05-08 NOTE — Progress Notes (Signed)
Hypoglycemic Event  CBG: 66  Treatment: 4 oz juice/soda  Symptoms: None  Follow-up CBG: Time:2348 CBG Result:85  Possible Reasons for Event: Medication regimen: IV insulin d/t hyperkalemia  Comments/MD notified:Dr. Royce Macadamia @ bedside aware and following hypoglycemic protocol     Ananias Pilgrim

## 2019-05-08 NOTE — ED Notes (Signed)
Date and time results received: 05/08/19 16:24  Test: Potassium  Critical Value: 7.3  Name of Provider Notified: Dr. Madison Hickman   Orders Received? Or Actions Taken?: Orders have already placed.

## 2019-05-09 DIAGNOSIS — D631 Anemia in chronic kidney disease: Secondary | ICD-10-CM | POA: Diagnosis present

## 2019-05-09 DIAGNOSIS — Z9181 History of falling: Secondary | ICD-10-CM | POA: Diagnosis not present

## 2019-05-09 DIAGNOSIS — Z20828 Contact with and (suspected) exposure to other viral communicable diseases: Secondary | ICD-10-CM | POA: Diagnosis present

## 2019-05-09 DIAGNOSIS — E1122 Type 2 diabetes mellitus with diabetic chronic kidney disease: Secondary | ICD-10-CM | POA: Diagnosis present

## 2019-05-09 DIAGNOSIS — Z841 Family history of disorders of kidney and ureter: Secondary | ICD-10-CM | POA: Diagnosis not present

## 2019-05-09 DIAGNOSIS — N184 Chronic kidney disease, stage 4 (severe): Secondary | ICD-10-CM | POA: Diagnosis present

## 2019-05-09 DIAGNOSIS — N281 Cyst of kidney, acquired: Secondary | ICD-10-CM | POA: Diagnosis present

## 2019-05-09 DIAGNOSIS — N401 Enlarged prostate with lower urinary tract symptoms: Secondary | ICD-10-CM | POA: Diagnosis present

## 2019-05-09 DIAGNOSIS — E875 Hyperkalemia: Secondary | ICD-10-CM | POA: Diagnosis present

## 2019-05-09 DIAGNOSIS — R3915 Urgency of urination: Secondary | ICD-10-CM | POA: Diagnosis present

## 2019-05-09 DIAGNOSIS — Z7984 Long term (current) use of oral hypoglycemic drugs: Secondary | ICD-10-CM | POA: Diagnosis not present

## 2019-05-09 DIAGNOSIS — E871 Hypo-osmolality and hyponatremia: Secondary | ICD-10-CM | POA: Diagnosis present

## 2019-05-09 DIAGNOSIS — Z79899 Other long term (current) drug therapy: Secondary | ICD-10-CM | POA: Diagnosis not present

## 2019-05-09 DIAGNOSIS — R531 Weakness: Secondary | ICD-10-CM | POA: Diagnosis present

## 2019-05-09 DIAGNOSIS — I129 Hypertensive chronic kidney disease with stage 1 through stage 4 chronic kidney disease, or unspecified chronic kidney disease: Secondary | ICD-10-CM | POA: Diagnosis present

## 2019-05-09 DIAGNOSIS — E872 Acidosis: Secondary | ICD-10-CM | POA: Diagnosis present

## 2019-05-09 DIAGNOSIS — N179 Acute kidney failure, unspecified: Secondary | ICD-10-CM | POA: Diagnosis present

## 2019-05-09 LAB — RENAL FUNCTION PANEL
Albumin: 3.5 g/dL (ref 3.5–5.0)
Anion gap: 9 (ref 5–15)
BUN: 36 mg/dL — ABNORMAL HIGH (ref 8–23)
CO2: 22 mmol/L (ref 22–32)
Calcium: 8.8 mg/dL — ABNORMAL LOW (ref 8.9–10.3)
Chloride: 102 mmol/L (ref 98–111)
Creatinine, Ser: 2.39 mg/dL — ABNORMAL HIGH (ref 0.61–1.24)
GFR calc Af Amer: 28 mL/min — ABNORMAL LOW (ref >60)
GFR calc non Af Amer: 24 mL/min — ABNORMAL LOW (ref >60)
Glucose, Bld: 107 mg/dL — ABNORMAL HIGH (ref 70–99)
Phosphorus: 5.5 mg/dL — ABNORMAL HIGH (ref 2.5–4.6)
Potassium: 5.7 mmol/L — ABNORMAL HIGH (ref 3.5–5.1)
Sodium: 133 mmol/L — ABNORMAL LOW (ref 135–145)

## 2019-05-09 LAB — URINE CULTURE: Culture: NO GROWTH

## 2019-05-09 LAB — PROTEIN / CREATININE RATIO, URINE
Creatinine, Urine: 27.82 mg/dL
Total Protein, Urine: <6 mg/dL

## 2019-05-09 LAB — GLUCOSE, CAPILLARY
Glucose-Capillary: 89 mg/dL (ref 70–99)
Glucose-Capillary: 127 mg/dL — ABNORMAL HIGH (ref 70–99)
Glucose-Capillary: 112 mg/dL — ABNORMAL HIGH (ref 70–99)
Glucose-Capillary: 143 mg/dL — ABNORMAL HIGH (ref 70–99)

## 2019-05-09 LAB — POTASSIUM
Potassium: 4.6 mmol/L (ref 3.5–5.1)
Potassium: 4.8 mmol/L (ref 3.5–5.1)
Potassium: 5 mmol/L (ref 3.5–5.1)
Potassium: 5.7 mmol/L — ABNORMAL HIGH (ref 3.5–5.1)
Potassium: 5.9 mmol/L — ABNORMAL HIGH (ref 3.5–5.1)
Potassium: 6.3 mmol/L (ref 3.5–5.1)

## 2019-05-09 MED ORDER — CALCIUM GLUCONATE-NACL 1-0.675 GM/50ML-% IV SOLN
1.0000 g | Freq: Once | INTRAVENOUS | Status: AC
  Administered 2019-05-09: 1000 mg via INTRAVENOUS
  Filled 2019-05-09: qty 50

## 2019-05-09 MED ORDER — SODIUM POLYSTYRENE SULFONATE 15 GM/60ML PO SUSP
30.0000 g | Freq: Once | ORAL | Status: AC
  Administered 2019-05-09: 30 g via ORAL
  Filled 2019-05-09: qty 120

## 2019-05-09 MED ORDER — SODIUM ZIRCONIUM CYCLOSILICATE 10 G PO PACK
10.0000 g | PACK | Freq: Two times a day (BID) | ORAL | Status: DC
  Administered 2019-05-09 (×2): 10 g via ORAL
  Filled 2019-05-09 (×3): qty 1

## 2019-05-09 NOTE — Progress Notes (Signed)
PROGRESS NOTE    Erik Moss  JKK:[id number] DOB: [birth year] DOA: 05/08/2019 PCP: [name], FNP   Brief Narrative:    Erik Moss is a 85 y.o. male with medical history significant of hypertension, type 2 DM, ? Stage 4 CKD, presents to ED for generalized weakness over the last 2 to 3 days  Assessment & Plan:   Active Problems:   ARF (acute renal failure) (HCC)  Acute renal failure on stage IV CKD with hyperkalemia and non-anion gap metabolic acidosis Admitted for IV fluids. Probably worsening of medical renal disease.  No signs of obstruction on renal ultrasound. Appreciate nephrology recommendations.  Continue with sodium bicarbonate and Lokelma.  Recent potassium is around 5.  Patient has good urine output without any issues.  Further work-up by nephrology is pending.    Type 2 diabetes mellitus CBG (last 3)  Recent Labs    05/08/19 2348 05/09/19 0826 05/09/19 1212  GLUCAP 85 89 127*   Resume SSI.    Hypertension:  Well controlled.     DVT prophylaxis: heparin.  Code Status:full code.  Family Communication:discussed with daughter yesterday.  Disposition Plan: pending further work up.   Consultants:   Nephrology  Procedures:  renal ultrasound Antimicrobials: None  Subjective: Patient denies any chest pain or shortness of breath, nausea or vomiting  Objective: Vitals:   05/08/19 1756 05/08/19 2000 05/08/19 2019 05/09/19 0414  BP:   (!) 144/76 134/67  Pulse: (!) 104  84 74  Resp: 19  18 16   Temp:   98 F (36.7 C) 97.8 F (36.6 C)  TempSrc:   Oral Oral  SpO2: 98%  100% 98%  Weight:  67.9 kg    Height:  5\' 6"  (1.676 m)      Intake/Output Summary (Last 24 hours) at 05/09/2019 1429 Last data filed at 05/09/2019 1304 Gross per 24 hour  Intake 1890.48 ml  Output 2049 ml  Net -158.52 ml   Filed Weights   05/08/19 1505 05/08/19 2000  Weight: 68 kg 67.9 kg    Examination:  General exam: Appears calm and comfortable  Respiratory  system: Clear to auscultation. Respiratory effort normal. Cardiovascular system: S1 & S2 heard, RRR. No JVD, murmurs, rubs, gallops or clicks. No pedal edema. Gastrointestinal system: Abdomen is nondistended, soft and nontender. No organomegaly or masses felt. Normal bowel sounds heard. Central nervous system: Alert and oriented. No focal neurological deficits. Extremities: Symmetric 5 x 5 power. Skin: No rashes, lesions or ulcers Psychiatry: Judgement and insight appear normal. Mood & affect appropriate.     Data Reviewed: I have personally reviewed following labs and imaging studies  CBC: Recent Labs  Lab 05/08/19 1528  WBC 9.4  NEUTROABS 6.5  HGB 10.7*  HCT 33.6*  MCV 94.4  PLT 716   Basic Metabolic Panel: Recent Labs  Lab 05/08/19 1528 05/08/19 2026 05/08/19 2211 05/09/19 0021 05/09/19 0407 05/09/19 0828 05/09/19 1236  NA 127* 130* 130*  --  133*  --   --   K 7.3* 6.9* 6.1* 5.9* 5.7*  5.7* 6.3* 5.0  CL 96* 101 100  --  102  --   --   CO2 20* 19* 19*  --  22  --   --   GLUCOSE 109* 92 127*  --  107*  --   --   BUN 45* 41* 39*  --  36*  --   --   CREATININE 3.02* 2.78* 2.75*  --  2.39*  --   --  CALCIUM 9.5 9.4 9.0  --  8.8*  --   --   PHOS  --   --   --   --  5.5*  --   --    GFR: Estimated Creatinine Clearance: 21.1 mL/min (A) (by C-G formula based on SCr of 2.39 mg/dL (H)). Liver Function Tests: Recent Labs  Lab 05/08/19 1528 05/09/19 0407  AST 18  --   ALT 17  --   ALKPHOS 50  --   BILITOT 0.5  --   PROT 7.3  --   ALBUMIN 4.4 3.5   No results for input(s): LIPASE, AMYLASE in the last 168 hours. No results for input(s): AMMONIA in the last 168 hours. Coagulation Profile: No results for input(s): INR, PROTIME in the last 168 hours. Cardiac Enzymes: No results for input(s): CKTOTAL, CKMB, CKMBINDEX, TROPONINI in the last 168 hours. BNP (last 3 results) No results for input(s): PROBNP in the last 8760 hours. HbA1C: Recent Labs    05/08/19 2026   HGBA1C 7.3*   CBG: Recent Labs  Lab 05/08/19 1506 05/08/19 2252 05/08/19 2348 05/09/19 0826 05/09/19 1212  GLUCAP 112* 66* 85 89 127*   Lipid Profile: No results for input(s): CHOL, HDL, LDLCALC, TRIG, CHOLHDL, LDLDIRECT in the last 72 hours. Thyroid Function Tests: No results for input(s): TSH, T4TOTAL, FREET4, T3FREE, THYROIDAB in the last 72 hours. Anemia Panel: No results for input(s): VITAMINB12, FOLATE, FERRITIN, TIBC, IRON, RETICCTPCT in the last 72 hours. Sepsis Labs: No results for input(s): PROCALCITON, LATICACIDVEN in the last 168 hours.  Recent Results (from the past 240 hour(s))  SARS Coronavirus 2 (CEPHEID - Performed in [place] hospital lab), Hosp Order     Status: None   Collection Time: 05/08/19  5:52 PM   Specimen: Nasopharyngeal Swab  Result Value Ref Range Status   SARS Coronavirus 2 NEGATIVE NEGATIVE Final    Comment: (NOTE) If result is NEGATIVE SARS-CoV-2 target nucleic acids are NOT DETECTED. The SARS-CoV-2 RNA is generally detectable in upper and lower  respiratory specimens during the acute phase of infection. The lowest  concentration of SARS-CoV-2 viral copies this assay can detect is 250  copies / mL. A negative result does not preclude SARS-CoV-2 infection  and should not be used as the sole basis for treatment or other  patient management decisions.  A negative result may occur with  improper specimen collection / handling, submission of specimen other  than nasopharyngeal swab, presence of viral mutation(s) within the  areas targeted by this assay, and inadequate number of viral copies  (<250 copies / mL). A negative result must be combined with clinical  observations, patient history, and epidemiological information. If result is POSITIVE SARS-CoV-2 target nucleic acids are DETECTED. The SARS-CoV-2 RNA is generally detectable in upper and lower  respiratory specimens dur ing the acute phase of infection.  Positive  results are  indicative of active infection with SARS-CoV-2.  Clinical  correlation with patient history and other diagnostic information is  necessary to determine patient infection status.  Positive results do  not rule out bacterial infection or co-infection with other viruses. If result is PRESUMPTIVE POSTIVE SARS-CoV-2 nucleic acids MAY BE PRESENT.   A presumptive positive result was obtained on the submitted specimen  and confirmed on repeat testing.  While 2019 novel coronavirus  (SARS-CoV-2) nucleic acids may be present in the submitted sample  additional confirmatory testing may be necessary for epidemiological  and / or clinical management purposes  to differentiate between  SARS-CoV-2 and other Sarbecovirus currently known to infect humans.  If clinically indicated additional testing with an alternate test  methodology [id number]) is advised. The SARS-CoV-2 RNA is generally  detectable in upper and lower respiratory sp ecimens during the acute  phase of infection. The expected result is Negative. Fact Sheet for Patients:  StrictlyIdeas.no Fact Sheet for Healthcare Providers: BankingDealers.co.za This test is not yet approved or cleared by the [place] FDA and has been authorized for detection and/or diagnosis of SARS-CoV-2 by FDA under an Emergency Use Authorization (EUA).  This EUA will remain in effect (meaning this test can be used) for the duration of the COVID-19 declaration under Section 564(b)(1) of the Act, 21 U.S.C. section 360bbb-3(b)(1), unless the authorization is terminated or revoked sooner. Performed at [facility], [place] [name] [place], [place] [address]          Radiology Studies: Ct Head Wo Contrast  Result Date: 05/08/2019 CLINICAL DATA:  Altered level of consciousness, dizzy, noticed by daughter. No injury. EXAM: CT HEAD WITHOUT CONTRAST TECHNIQUE: Contiguous axial images were obtained  from the base of the skull through the vertex without intravenous contrast. COMPARISON:  None. FINDINGS: Brain: No evidence of acute infarction, hemorrhage, hydrocephalus, extra-axial collection or mass lesion/mass effect. Patchy areas of white matter hypoattenuation are most compatible with chronic microvascular angiopathy. Symmetric prominence of the ventricles, cisterns and sulci compatible with parenchymal volume loss. Vascular: Atherosclerotic calcification of the carotid siphons and intradural vertebral arteries. Skull: No calvarial fracture or suspicious osseous lesion. No scalp swelling or hematoma. Round soft tissue density in the posterior midline scalp may reflect small dermal inclusion cyst. Sinuses/Orbits: Minimal thickening of the right posterior ethmoids. No air-fluid levels. Paranasal sinuses and mastoid air cells are otherwise predominantly clear. Orbital structures are unremarkable. Other: None IMPRESSION: No acute intracranial abnormality. Chronic white matter changes and volume loss. Mild right ethmoidal sinus disease. Electronically Signed   By: Lovena Le M.D.   On: 05/08/2019 17:09   US Renal  Result Date: 05/08/2019 CLINICAL DATA:  Acute renal failure EXAM: RENAL / URINARY TRACT ULTRASOUND COMPLETE COMPARISON:  None. FINDINGS: Right Kidney: Renal measurements: 9.7 x 1.9 x 5.0 cm = volume: 125 mL. Increased echotexture. At least 2 midpole cysts, the largest 2.6 cm. This cyst appears septated. No hydronephrosis. Left Kidney: Renal measurements: 10.2 x 5.0 x 3.9 cm = volume: 103 mL. 1.7 cm upper pole cyst. Increased echotexture. No hydronephrosis. Bladder: Appears normal for degree of bladder distention. IMPRESSION: Increased echotexture compatible with chronic medical renal disease. No hydronephrosis. Bilateral renal cysts. The largest cyst on the right measures 2.6 cm with internal septations. This likely reflects a benign complex cyst. This could be followed with repeat ultrasound in 6  months to 1 year to ensure stability. Electronically Signed   By: Rolm Baptise M.D.   On: 05/08/2019 21:32        Scheduled Meds: . amLODipine  5 mg Oral Daily  . insulin aspart  0-5 Units Subcutaneous QHS  . insulin aspart  0-9 Units Subcutaneous TID WC  . sodium bicarbonate  650 mg Oral TID  . sodium zirconium cyclosilicate  10 g Oral BID  . tamsulosin  0.4 mg Oral QHS   Continuous Infusions: . sodium chloride 125 mL/hr at 05/09/19 0407     LOS: 0 days    Time spent: 45 min    Hosie Poisson, MD [facility] Pager [phone]  If 7PM-7AM, please contact night-coverage [email or web address] Password [facility] 05/09/2019,  2:29 PM

## 2019-05-09 NOTE — Progress Notes (Signed)
Subjective:  Hemodynamically stable overnight-  1 liter of UOP- crt trending down.  K down to 5.7 but then up to 6.3 Objective Vital signs in last 24 hours: Vitals:   05/08/19 1756 05/08/19 2000 05/08/19 2019 05/09/19 0414  BP:   (!) 144/76 134/67  Pulse: (!) 104  84 74  Resp: 19  18 16   Temp:   98 F (36.7 C) 97.8 F (36.6 C)  TempSrc:   Oral Oral  SpO2: 98%  100% 98%  Weight:  67.9 kg    Height:  5\' 6"  (1.676 m)     Weight change:   Intake/Output Summary (Last 24 hours) at 05/09/2019 1111 Last data filed at 05/09/2019 1020 Gross per 24 hour  Intake 1770.48 ml  Output 1099 ml  Net 671.48 ml    Assessment/ Plan: Pt is a 85 y.o. yo male who was admitted on 05/08/2019 with hyperkalemia in the setting of ACE-I and CKD and high K diet  Assessment/Plan: 1. Hyperkalemia-  Seems clear that he does have baseline CKD- that contributed as well as his high K diet and ACE-I.  Did get it to trend down with medical management, now trending up again per labs.  Will give scheduled lokelma and agree with oral bicarb.  Should not need any more kaexylate or other acute treatments. Low K diet 2. CKD-  Unclear what is his exact baseline but does seem to have CKD.  Do not give ACE- I.  No obstruction on u/s but does show evidence of medical renal disease.  OK to give flomax.. Non oliguric.  crt seems to be trending down  3. Anemia- not extreme- likely related to CKD-  Will check iron stores and treat as needed  4. HTN/volume- not overloaded in the least - amlodipine 5 and flomax   Louis Meckel    Labs: Basic Metabolic Panel: Recent Labs  Lab 05/08/19 2026 05/08/19 2211 05/09/19 0021 05/09/19 0407 05/09/19 0828  NA 130* 130*  --  133*  --   K 6.9* 6.1* 5.9* 5.7*  5.7* 6.3*  CL 101 100  --  102  --   CO2 19* 19*  --  22  --   GLUCOSE 92 127*  --  107*  --   BUN 41* 39*  --  36*  --   CREATININE 2.78* 2.75*  --  2.39*  --   CALCIUM 9.4 9.0  --  8.8*  --   PHOS  --   --   --  5.5*   --    Liver Function Tests: Recent Labs  Lab 05/08/19 1528 05/09/19 0407  AST 18  --   ALT 17  --   ALKPHOS 50  --   BILITOT 0.5  --   PROT 7.3  --   ALBUMIN 4.4 3.5   No results for input(s): LIPASE, AMYLASE in the last 168 hours. No results for input(s): AMMONIA in the last 168 hours. CBC: Recent Labs  Lab 05/08/19 1528  WBC 9.4  NEUTROABS 6.5  HGB 10.7*  HCT 33.6*  MCV 94.4  PLT 310   Cardiac Enzymes: No results for input(s): CKTOTAL, CKMB, CKMBINDEX, TROPONINI in the last 168 hours. CBG: Recent Labs  Lab 05/08/19 1506 05/08/19 2252 05/08/19 2348 05/09/19 0826  GLUCAP 112* 66* 85 89    Iron Studies: No results for input(s): IRON, TIBC, TRANSFERRIN, FERRITIN in the last 72 hours. Studies/Results: Ct Head Wo Contrast  Result Date: 05/08/2019 CLINICAL DATA:  Altered  level of consciousness, dizzy, noticed by daughter. No injury. EXAM: CT HEAD WITHOUT CONTRAST TECHNIQUE: Contiguous axial images were obtained from the base of the skull through the vertex without intravenous contrast. COMPARISON:  None. FINDINGS: Brain: No evidence of acute infarction, hemorrhage, hydrocephalus, extra-axial collection or mass lesion/mass effect. Patchy areas of white matter hypoattenuation are most compatible with chronic microvascular angiopathy. Symmetric prominence of the ventricles, cisterns and sulci compatible with parenchymal volume loss. Vascular: Atherosclerotic calcification of the carotid siphons and intradural vertebral arteries. Skull: No calvarial fracture or suspicious osseous lesion. No scalp swelling or hematoma. Round soft tissue density in the posterior midline scalp may reflect small dermal inclusion cyst. Sinuses/Orbits: Minimal thickening of the right posterior ethmoids. No air-fluid levels. Paranasal sinuses and mastoid air cells are otherwise predominantly clear. Orbital structures are unremarkable. Other: None IMPRESSION: No acute intracranial abnormality. Chronic  white matter changes and volume loss. Mild right ethmoidal sinus disease. Electronically Signed   By: Lovena Le M.D.   On: 05/08/2019 17:09   US Renal  Result Date: 05/08/2019 CLINICAL DATA:  Acute renal failure EXAM: RENAL / URINARY TRACT ULTRASOUND COMPLETE COMPARISON:  None. FINDINGS: Right Kidney: Renal measurements: 9.7 x 1.9 x 5.0 cm = volume: 125 mL. Increased echotexture. At least 2 midpole cysts, the largest 2.6 cm. This cyst appears septated. No hydronephrosis. Left Kidney: Renal measurements: 10.2 x 5.0 x 3.9 cm = volume: 103 mL. 1.7 cm upper pole cyst. Increased echotexture. No hydronephrosis. Bladder: Appears normal for degree of bladder distention. IMPRESSION: Increased echotexture compatible with chronic medical renal disease. No hydronephrosis. Bilateral renal cysts. The largest cyst on the right measures 2.6 cm with internal septations. This likely reflects a benign complex cyst. This could be followed with repeat ultrasound in 6 months to 1 year to ensure stability. Electronically Signed   By: Rolm Baptise M.D.   On: 05/08/2019 21:32   Medications: Infusions: . sodium chloride 125 mL/hr at 05/09/19 0407  . calcium gluconate 1,000 mg (05/09/19 1044)    Scheduled Medications: . amLODipine  5 mg Oral Daily  . insulin aspart  0-5 Units Subcutaneous QHS  . insulin aspart  0-9 Units Subcutaneous TID WC  . sodium bicarbonate  650 mg Oral TID  . tamsulosin  0.4 mg Oral QHS    have reviewed scheduled and prn medications.  Physical Exam: General: alert, nad Heart: RRR Lungs: clear Abdomen: soft, non tender Extremities: no edema    05/09/2019,11:11 AM  LOS: 0 days

## 2019-05-09 NOTE — Plan of Care (Signed)
  Problem: Education: Goal: Knowledge of General Education information will improve Description: Including pain rating scale, medication(s)/side effects and non-pharmacologic comfort measures Outcome: Completed/Met   Problem: Activity: Goal: Risk for activity intolerance will decrease Outcome: Completed/Met   Problem: Pain Managment: Goal: General experience of comfort will improve Outcome: Completed/Met

## 2019-05-09 NOTE — Progress Notes (Signed)
Called pt's daughter, [name], to give her an update per the pt request. Nam Vossler, Laurel Dimmer

## 2019-05-09 NOTE — Progress Notes (Signed)
CRITICAL VALUE ALERT  Critical Value: K+=6.3  Date & Time Notied:  05/08/2019  0930  Provider Notified: Dr. Karleen Hampshire  Orders Received/Actions taken: orders received

## 2019-05-10 LAB — PROTEIN ELECTROPHORESIS, SERUM
A/G Ratio: 1.6 (ref 0.7–1.7)
Albumin ELP: 3.6 g/dL (ref 2.9–4.4)
Alpha-1-Globulin: 0.2 g/dL (ref 0.0–0.4)
Alpha-2-Globulin: 0.7 g/dL (ref 0.4–1.0)
Beta Globulin: 0.7 g/dL (ref 0.7–1.3)
Gamma Globulin: 0.6 g/dL (ref 0.4–1.8)
Globulin, Total: 2.2 g/dL (ref 2.2–3.9)
Total Protein ELP: 5.8 g/dL — ABNORMAL LOW (ref 6.0–8.5)

## 2019-05-10 LAB — KAPPA/LAMBDA LIGHT CHAINS
Kappa free light chain: 31.1 mg/L — ABNORMAL HIGH (ref 3.3–19.4)
Kappa, lambda light chain ratio: 1.2 (ref 0.26–1.65)
Lambda free light chains: 25.9 mg/L (ref 5.7–26.3)

## 2019-05-10 LAB — POTASSIUM
Potassium: 4.6 mmol/L (ref 3.5–5.1)
Potassium: 4.8 mmol/L (ref 3.5–5.1)

## 2019-05-10 LAB — RENAL FUNCTION PANEL
Albumin: 3.1 g/dL — ABNORMAL LOW (ref 3.5–5.0)
Anion gap: 7 (ref 5–15)
BUN: 25 mg/dL — ABNORMAL HIGH (ref 8–23)
CO2: 22 mmol/L (ref 22–32)
Calcium: 8.3 mg/dL — ABNORMAL LOW (ref 8.9–10.3)
Chloride: 108 mmol/L (ref 98–111)
Creatinine, Ser: 1.95 mg/dL — ABNORMAL HIGH (ref 0.61–1.24)
GFR calc Af Amer: 36 mL/min — ABNORMAL LOW (ref >60)
GFR calc non Af Amer: 31 mL/min — ABNORMAL LOW (ref >60)
Glucose, Bld: 91 mg/dL (ref 70–99)
Phosphorus: 4.6 mg/dL (ref 2.5–4.6)
Potassium: 4.7 mmol/L (ref 3.5–5.1)
Sodium: 137 mmol/L (ref 135–145)

## 2019-05-10 LAB — FERRITIN: Ferritin: 179 ng/mL (ref 24–336)

## 2019-05-10 LAB — IRON AND TIBC
Iron: 50 ug/dL (ref 45–182)
Saturation Ratios: 24 % (ref 17.9–39.5)
TIBC: 205 ug/dL — ABNORMAL LOW (ref 250–450)
UIBC: 155 ug/dL

## 2019-05-10 LAB — GLUCOSE, CAPILLARY
Glucose-Capillary: 93 mg/dL (ref 70–99)
Glucose-Capillary: 145 mg/dL — ABNORMAL HIGH (ref 70–99)

## 2019-05-10 MED ORDER — SODIUM CHLORIDE 0.9 % IV SOLN
510.0000 mg | Freq: Once | INTRAVENOUS | Status: AC
  Administered 2019-05-10: 510 mg via INTRAVENOUS
  Filled 2019-05-10: qty 17

## 2019-05-10 NOTE — Progress Notes (Signed)
Pt's daughter, [name], called at the request of pt with updates regarding the plan of care. Stacey Drain

## 2019-05-10 NOTE — Progress Notes (Signed)
Subjective:  Hemodynamically stable overnight, BP maybe a little high-  3 liter of UOP- crt trending down again.  K down as well  Objective Vital signs in last 24 hours: Vitals:   05/09/19 0414 05/09/19 1344 05/09/19 2121 05/10/19 0658  BP: 134/67 130/77 (!) 151/71 (!) 152/66  Pulse: 74 76 71 (!) 57  Resp: 16 18 18 16   Temp: 97.8 F (36.6 C) 98.7 F (37.1 C) 98.2 F (36.8 C) 97.9 F (36.6 C)  TempSrc: Oral Oral Oral   SpO2: 98% 100% 98% 98%  Weight:      Height:       Weight change:   Intake/Output Summary (Last 24 hours) at 05/10/2019 1209 Last data filed at 05/10/2019 0830 Gross per 24 hour  Intake 2347.57 ml  Output 3850 ml  Net -1502.43 ml    Assessment/ Plan: Pt is a 85 y.o. yo male who was admitted on 05/08/2019 with hyperkalemia in the setting of ACE-I and CKD and high K diet  Assessment/Plan: 1. Hyperkalemia-  Seems clear that he does have baseline CKD- that contributed as well as his high K diet and ACE-I.  Did get it to trend down with medical management only.  Was giving scheduled lokelma and oral bicarb. Will stop lokelma.  Should not need any more other acute treatments. Low K diet.  Myeloma work up is pending but no significant proteinuria 2. CKD-  Unclear what is his exact baseline but does seem to have CKD.  Have been holding ACE- I.  No obstruction on u/s but does show evidence of medical renal disease.  On flomax. Non oliguric.  crt seems to be trending down  3. Anemia- not extreme- likely related to CKD-   iron stores low, will give feraheme dose prior to discharge   4. HTN/volume- not overloaded in the least - amlodipine 5 and flomax. While holding ACE will increase amloidpine to 10 mg daily    I think would be OK to discharge to home on amlodipine 10 and sodium bicarb- not on ACE-I.  I will arrange follow up with Dr. Royce Macadamia at [facility] for CKD that seems stable to improved at this time.    Louis Meckel    Labs: Basic Metabolic Panel: Recent Labs  Lab  05/08/19 2211  05/09/19 0407  05/10/19 0033 05/10/19 0427 05/10/19 0831  NA 130*  --  133*  --   --  137  --   K 6.1*   < > 5.7*  5.7*   < > 4.8 4.7 4.6  CL 100  --  102  --   --  108  --   CO2 19*  --  22  --   --  22  --   GLUCOSE 127*  --  107*  --   --  91  --   BUN 39*  --  36*  --   --  25*  --   CREATININE 2.75*  --  2.39*  --   --  1.95*  --   CALCIUM 9.0  --  8.8*  --   --  8.3*  --   PHOS  --   --  5.5*  --   --  4.6  --    < > = values in this interval not displayed.   Liver Function Tests: Recent Labs  Lab 05/08/19 1528 05/09/19 0407 05/10/19 0427  AST 18  --   --   ALT 17  --   --  ALKPHOS 50  --   --   BILITOT 0.5  --   --   PROT 7.3  --   --   ALBUMIN 4.4 3.5 3.1*   No results for input(s): LIPASE, AMYLASE in the last 168 hours. No results for input(s): AMMONIA in the last 168 hours. CBC: Recent Labs  Lab 05/08/19 1528  WBC 9.4  NEUTROABS 6.5  HGB 10.7*  HCT 33.6*  MCV 94.4  PLT 310   Cardiac Enzymes: No results for input(s): CKTOTAL, CKMB, CKMBINDEX, TROPONINI in the last 168 hours. CBG: Recent Labs  Lab 05/09/19 0826 05/09/19 1212 05/09/19 1602 05/09/19 2118 05/10/19 0837  GLUCAP 89 127* 112* 143* 93    Iron Studies:  Recent Labs    05/10/19 0427  IRON 50  TIBC 205*  FERRITIN 179   Studies/Results: Ct Head Wo Contrast  Result Date: 05/08/2019 CLINICAL DATA:  Altered level of consciousness, dizzy, noticed by daughter. No injury. EXAM: CT HEAD WITHOUT CONTRAST TECHNIQUE: Contiguous axial images were obtained from the base of the skull through the vertex without intravenous contrast. COMPARISON:  None. FINDINGS: Brain: No evidence of acute infarction, hemorrhage, hydrocephalus, extra-axial collection or mass lesion/mass effect. Patchy areas of white matter hypoattenuation are most compatible with chronic microvascular angiopathy. Symmetric prominence of the ventricles, cisterns and sulci compatible with parenchymal volume loss.  Vascular: Atherosclerotic calcification of the carotid siphons and intradural vertebral arteries. Skull: No calvarial fracture or suspicious osseous lesion. No scalp swelling or hematoma. Round soft tissue density in the posterior midline scalp may reflect small dermal inclusion cyst. Sinuses/Orbits: Minimal thickening of the right posterior ethmoids. No air-fluid levels. Paranasal sinuses and mastoid air cells are otherwise predominantly clear. Orbital structures are unremarkable. Other: None IMPRESSION: No acute intracranial abnormality. Chronic white matter changes and volume loss. Mild right ethmoidal sinus disease. Electronically Signed   By: Lovena Le M.D.   On: 05/08/2019 17:09   US Renal  Result Date: 05/08/2019 CLINICAL DATA:  Acute renal failure EXAM: RENAL / URINARY TRACT ULTRASOUND COMPLETE COMPARISON:  None. FINDINGS: Right Kidney: Renal measurements: 9.7 x 1.9 x 5.0 cm = volume: 125 mL. Increased echotexture. At least 2 midpole cysts, the largest 2.6 cm. This cyst appears septated. No hydronephrosis. Left Kidney: Renal measurements: 10.2 x 5.0 x 3.9 cm = volume: 103 mL. 1.7 cm upper pole cyst. Increased echotexture. No hydronephrosis. Bladder: Appears normal for degree of bladder distention. IMPRESSION: Increased echotexture compatible with chronic medical renal disease. No hydronephrosis. Bilateral renal cysts. The largest cyst on the right measures 2.6 cm with internal septations. This likely reflects a benign complex cyst. This could be followed with repeat ultrasound in 6 months to 1 year to ensure stability. Electronically Signed   By: Rolm Baptise M.D.   On: 05/08/2019 21:32   Medications: Infusions: . sodium chloride 125 mL/hr at 05/10/19 1058    Scheduled Medications: . amLODipine  5 mg Oral Daily  . insulin aspart  0-5 Units Subcutaneous QHS  . insulin aspart  0-9 Units Subcutaneous TID WC  . sodium bicarbonate  650 mg Oral TID  . tamsulosin  0.4 mg Oral QHS    have  reviewed scheduled and prn medications.  Physical Exam: General: alert, nad Heart: RRR Lungs: clear Abdomen: soft, non tender Extremities: no edema    05/10/2019,12:09 PM  LOS: 1 day

## 2019-05-10 NOTE — Plan of Care (Signed)
  Problem: Health Behavior/Discharge Planning: Goal: Ability to manage health-related needs will improve Outcome: Adequate for Discharge   Problem: Clinical Measurements: Goal: Ability to maintain clinical measurements within normal limits will improve Outcome: Adequate for Discharge   Problem: Clinical Measurements: Goal: Diagnostic test results will improve Outcome: Completed/Met Goal: Respiratory complications will improve Outcome: Completed/Met Goal: Cardiovascular complication will be avoided Outcome: Completed/Met   Problem: Nutrition: Goal: Adequate nutrition will be maintained Outcome: Completed/Met   Problem: Coping: Goal: Level of anxiety will decrease Outcome: Completed/Met   Problem: Elimination: Goal: Will not experience complications related to bowel motility Outcome: Completed/Met Goal: Will not experience complications related to urinary retention Outcome: Completed/Met   Problem: Safety: Goal: Ability to remain free from injury will improve Outcome: Completed/Met   Problem: Skin Integrity: Goal: Risk for impaired skin integrity will decrease Outcome: Completed/Met

## 2019-05-10 NOTE — Discharge Summary (Signed)
Physician Discharge Summary  [name] MVH:[id number] DOB: 1933/03/29 DOA: 05/08/2019  PCP: [name], FNP  Admit date: 05/08/2019 Discharge date: 05/10/2019  Admitted From: Home.  Disposition:  Home.   Recommendations for Outpatient Follow-up:  1. Follow up with PCP in 1-2 weeks 2. Please obtain BMP/CBC in one week Please follow up with nephrology as recommended.     Discharge Condition:stable.  CODE STATUS full code.  Diet recommendation: Heart Healthy   Brief/Interim Summary: Erik Moss a 85 y.o.malewith medical history significant ofhypertension, type 2 DM, ? Stage 4 CKD, presents to ED for generalized weakness over the last 2 to 3 days, he was found to be in AKI and hyperkalemic.   Discharge Diagnoses:  Active Problems:   ARF (acute renal failure) (HCC)  Acute renal failure on stage IV CKD with hyperkalemia and non-anion gap metabolic acidosis Admitted for IV fluids. Probably worsening of medical renal disease.  No signs of obstruction on renal ultrasound. Appreciate nephrology recommendations.   Patient has good urine output without any issues.   Creatinine improved and potassium wnl. Bicarb level wnl. Hold lisinopril on discharge.    Type 2 diabetes mellitus Resume home meds.     Hypertension:  Well controlled   Discharge Instructions  Discharge Instructions    Diet - low sodium heart healthy   Complete by: As directed    Discharge instructions   Complete by: As directed    Please follow up with nephrology as recommended on discharge.     Allergies as of 05/10/2019   No Known Allergies     Medication List    STOP taking these medications   lisinopril 5 MG tablet Commonly known as: ZESTRIL     TAKE these medications   amLODipine 5 MG tablet Commonly known as: NORVASC Take 5 mg by mouth daily.   ferrous gluconate 324 MG tablet Commonly known as: FERGON Take 648 mg by mouth daily.   glimepiride 1 MG  tablet Commonly known as: AMARYL Take 1 mg by mouth daily with breakfast.   multivitamin with minerals Tabs tablet Take 1 tablet by mouth daily.   simvastatin 20 MG tablet Commonly known as: ZOCOR Take 20 mg by mouth every evening.   sitaGLIPtin 25 MG tablet Commonly known as: JANUVIA Take 25 mg by mouth daily.   tamsulosin 0.4 MG Caps capsule Commonly known as: FLOMAX Take 0.4 mg by mouth at bedtime.   triamcinolone ointment 0.1 % Commonly known as: KENALOG Apply 1 application topically daily as needed (itching on lower legs.).      Follow-up Information    [name], FNP. Schedule an appointment as soon as possible for a visit in 1 week(s).   Specialty: Family Medicine Contact information: [place] [id number] [phone]          No Known Allergies  Consultations:  Nephrology.    Procedures/Studies: Ct Head Wo Contrast  Result Date: 05/08/2019 CLINICAL DATA:  Altered level of consciousness, dizzy, noticed by daughter. No injury. EXAM: CT HEAD WITHOUT CONTRAST TECHNIQUE: Contiguous axial images were obtained from the base of the skull through the vertex without intravenous contrast. COMPARISON:  None. FINDINGS: Brain: No evidence of acute infarction, hemorrhage, hydrocephalus, extra-axial collection or mass lesion/mass effect. Patchy areas of white matter hypoattenuation are most compatible with chronic microvascular angiopathy. Symmetric prominence of the ventricles, cisterns and sulci compatible with parenchymal volume loss. Vascular: Atherosclerotic calcification of the carotid siphons and intradural vertebral arteries. Skull: No calvarial  fracture or suspicious osseous lesion. No scalp swelling or hematoma. Round soft tissue density in the posterior midline scalp may reflect small dermal inclusion cyst. Sinuses/Orbits: Minimal thickening of the right posterior ethmoids. No air-fluid levels. Paranasal sinuses and mastoid air cells are  otherwise predominantly clear. Orbital structures are unremarkable. Other: None IMPRESSION: No acute intracranial abnormality. Chronic white matter changes and volume loss. Mild right ethmoidal sinus disease. Electronically Signed   By: Lovena Le M.D.   On: 05/08/2019 17:09   US Renal  Result Date: 05/08/2019 CLINICAL DATA:  Acute renal failure EXAM: RENAL / URINARY TRACT ULTRASOUND COMPLETE COMPARISON:  None. FINDINGS: Right Kidney: Renal measurements: 9.7 x 1.9 x 5.0 cm = volume: 125 mL. Increased echotexture. At least 2 midpole cysts, the largest 2.6 cm. This cyst appears septated. No hydronephrosis. Left Kidney: Renal measurements: 10.2 x 5.0 x 3.9 cm = volume: 103 mL. 1.7 cm upper pole cyst. Increased echotexture. No hydronephrosis. Bladder: Appears normal for degree of bladder distention. IMPRESSION: Increased echotexture compatible with chronic medical renal disease. No hydronephrosis. Bilateral renal cysts. The largest cyst on the right measures 2.6 cm with internal septations. This likely reflects a benign complex cyst. This could be followed with repeat ultrasound in 6 months to 1 year to ensure stability. Electronically Signed   By: Rolm Baptise M.D.   On: 05/08/2019 21:32       Subjective:  No new complaints.  Discharge Exam: Vitals:   05/10/19 0658 05/10/19 1259  BP: (!) 152/66 (!) 150/60  Pulse: (!) 57 60  Resp: 16 16  Temp: 97.9 F (36.6 C) 98.1 F (36.7 C)  SpO2: 98% 99%   Vitals:   05/09/19 1344 05/09/19 2121 05/10/19 0658 05/10/19 1259  BP: 130/77 (!) 151/71 (!) 152/66 (!) 150/60  Pulse: 76 71 (!) 57 60  Resp: 18 18 16 16   Temp: 98.7 F (37.1 C) 98.2 F (36.8 C) 97.9 F (36.6 C) 98.1 F (36.7 C)  TempSrc: Oral Oral  Oral  SpO2: 100% 98% 98% 99%  Weight:      Height:        General: Pt is alert, awake, not in acute distress Cardiovascular: RRR, S1/S2 +, no rubs, no gallops Respiratory: CTA bilaterally, no wheezing, no rhonchi Abdominal: Soft, NT, ND,  bowel sounds + Extremities: no edema, no cyanosis    The results of significant diagnostics from this hospitalization (including imaging, microbiology, ancillary and laboratory) are listed below for reference.     Microbiology: Recent Results (from the past 240 hour(s))  Urine Culture     Status: None   Collection Time: 05/08/19  3:28 PM   Specimen: Urine, Random  Result Value Ref Range Status   Specimen Description   Final    URINE, RANDOM Performed at [place] [address], [place], [place] [address]    Special Requests   Final    NONE Performed at [facility], [place] [address], [place], [place] [address]    Culture   Final    NO GROWTH Performed at [facility], [place] [address] [place], [place] [address]    Report Status 05/09/2019 FINAL  Final  SARS Coronavirus 2 (CEPHEID - Performed in [place] hospital lab), Hosp Order     Status: None   Collection Time: 05/08/19  5:52 PM   Specimen: Nasopharyngeal Swab  Result Value Ref Range Status   SARS Coronavirus 2 NEGATIVE NEGATIVE Final    Comment: (NOTE) If  result is NEGATIVE SARS-CoV-2 target nucleic acids are NOT DETECTED. The SARS-CoV-2 RNA is generally detectable in upper and lower  respiratory specimens during the acute phase of infection. The lowest  concentration of SARS-CoV-2 viral copies this assay can detect is 250  copies / mL. A negative result does not preclude SARS-CoV-2 infection  and should not be used as the sole basis for treatment or other  patient management decisions.  A negative result may occur with  improper specimen collection / handling, submission of specimen other  than nasopharyngeal swab, presence of viral mutation(s) within the  areas targeted by this assay, and inadequate number of viral copies  (<250 copies / mL). A negative result must be combined with clinical  observations, patient history, and epidemiological information. If result is  POSITIVE SARS-CoV-2 target nucleic acids are DETECTED. The SARS-CoV-2 RNA is generally detectable in upper and lower  respiratory specimens dur ing the acute phase of infection.  Positive  results are indicative of active infection with SARS-CoV-2.  Clinical  correlation with patient history and other diagnostic information is  necessary to determine patient infection status.  Positive results do  not rule out bacterial infection or co-infection with other viruses. If result is PRESUMPTIVE POSTIVE SARS-CoV-2 nucleic acids MAY BE PRESENT.   A presumptive positive result was obtained on the submitted specimen  and confirmed on repeat testing.  While 2019 novel coronavirus  (SARS-CoV-2) nucleic acids may be present in the submitted sample  additional confirmatory testing may be necessary for epidemiological  and / or clinical management purposes  to differentiate between  SARS-CoV-2 and other Sarbecovirus currently known to infect humans.  If clinically indicated additional testing with an alternate test  methodology [phone]) is advised. The SARS-CoV-2 RNA is generally  detectable in upper and lower respiratory sp ecimens during the acute  phase of infection. The expected result is Negative. Fact Sheet for Patients:  StrictlyIdeas.no Fact Sheet for Healthcare Providers: BankingDealers.co.za This test is not yet approved or cleared by the [place] FDA and has been authorized for detection and/or diagnosis of SARS-CoV-2 by FDA under an Emergency Use Authorization (EUA).  This EUA will remain in effect (meaning this test can be used) for the duration of the COVID-19 declaration under Section 564(b)(1) of the Act, 21 U.S.C. section 360bbb-3(b)(1), unless the authorization is terminated or revoked sooner. Performed at [facility], [place] [address], [place], [place] [address]      Labs: BNP (last 3 results) No  results for input(s): BNP in the last 8760 hours. Basic Metabolic Panel: Recent Labs  Lab 05/08/19 1528 05/08/19 2026 05/08/19 2211  05/09/19 0407  05/09/19 1612 05/09/19 1958 05/10/19 0033 05/10/19 0427 05/10/19 0831  NA 127* 130* 130*  --  133*  --   --   --   --  137  --   K 7.3* 6.9* 6.1*   < > 5.7*  5.7*   < > 4.8 4.6 4.8 4.7 4.6  CL 96* 101 100  --  102  --   --   --   --  108  --   CO2 20* 19* 19*  --  22  --   --   --   --  22  --   GLUCOSE 109* 92 127*  --  107*  --   --   --   --  91  --   BUN 45* 41* 39*  --  36*  --   --   --   --  25*  --   CREATININE 3.02* 2.78* 2.75*  --  2.39*  --   --   --   --  1.95*  --   CALCIUM 9.5 9.4 9.0  --  8.8*  --   --   --   --  8.3*  --   PHOS  --   --   --   --  5.5*  --   --   --   --  4.6  --    < > = values in this interval not displayed.   Liver Function Tests: Recent Labs  Lab 05/08/19 1528 05/09/19 0407 05/10/19 0427  AST 18  --   --   ALT 17  --   --   ALKPHOS 50  --   --   BILITOT 0.5  --   --   PROT 7.3  --   --   ALBUMIN 4.4 3.5 3.1*   No results for input(s): LIPASE, AMYLASE in the last 168 hours. No results for input(s): AMMONIA in the last 168 hours. CBC: Recent Labs  Lab 05/08/19 1528  WBC 9.4  NEUTROABS 6.5  HGB 10.7*  HCT 33.6*  MCV 94.4  PLT 310   Cardiac Enzymes: No results for input(s): CKTOTAL, CKMB, CKMBINDEX, TROPONINI in the last 168 hours. BNP: Invalid input(s): POCBNP CBG: Recent Labs  Lab 05/09/19 1212 05/09/19 1602 05/09/19 2118 05/10/19 0837 05/10/19 1233  GLUCAP 127* 112* 143* 93 145*   D-Dimer No results for input(s): DDIMER in the last 72 hours. Hgb A1c Recent Labs    05/08/19 2026  HGBA1C 7.3*   Lipid Profile No results for input(s): CHOL, HDL, LDLCALC, TRIG, CHOLHDL, LDLDIRECT in the last 72 hours. Thyroid function studies No results for input(s): TSH, T4TOTAL, T3FREE, THYROIDAB in the last 72 hours.  Invalid input(s): FREET3 Anemia work up Recent Labs     05/10/19 0427  FERRITIN 179  TIBC 205*  IRON 50   Urinalysis    Component Value Date/Time   COLORURINE COLORLESS (A) 05/08/2019 2259   APPEARANCEUR CLEAR 05/08/2019 2259   LABSPEC 1.004 (L) 05/08/2019 2259   PHURINE 7.0 05/08/2019 2259   GLUCOSEU 150 (A) 05/08/2019 2259   HGBUR NEGATIVE 05/08/2019 2259   Bruno 05/08/2019 2259   KETONESUR NEGATIVE 05/08/2019 2259   PROTEINUR NEGATIVE 05/08/2019 2259   NITRITE NEGATIVE 05/08/2019 2259   LEUKOCYTESUR NEGATIVE 05/08/2019 2259   Sepsis Labs Invalid input(s): PROCALCITONIN,  WBC,  LACTICIDVEN Microbiology Recent Results (from the past 240 hour(s))  Urine Culture     Status: None   Collection Time: 05/08/19  3:28 PM   Specimen: Urine, Random  Result Value Ref Range Status   Specimen Description   Final    URINE, RANDOM Performed at [facility] [place] [address], [place], [place] [address]    Special Requests   Final    NONE Performed at [facility], [place] [address] [place], [place] [address]    Culture   Final    NO GROWTH Performed at [facility], [place] [address] [place], [place] [address]    Report Status 05/09/2019 FINAL  Final  SARS Coronavirus 2 (CEPHEID - Performed in [place] hospital lab), Hosp Order     Status: None   Collection Time: 05/08/19  5:52 PM   Specimen: Nasopharyngeal Swab  Result Value Ref Range Status   SARS Coronavirus 2 NEGATIVE NEGATIVE Final    Comment: (NOTE) If result is  NEGATIVE SARS-CoV-2 target nucleic acids are NOT DETECTED. The SARS-CoV-2 RNA is generally detectable in upper and lower  respiratory specimens during the acute phase of infection. The lowest  concentration of SARS-CoV-2 viral copies this assay can detect is 250  copies / mL. A negative result does not preclude SARS-CoV-2 infection  and should not be used as the sole basis for treatment or other  patient management decisions.  A negative result may occur with   improper specimen collection / handling, submission of specimen other  than nasopharyngeal swab, presence of viral mutation(s) within the  areas targeted by this assay, and inadequate number of viral copies  (<250 copies / mL). A negative result must be combined with clinical  observations, patient history, and epidemiological information. If result is POSITIVE SARS-CoV-2 target nucleic acids are DETECTED. The SARS-CoV-2 RNA is generally detectable in upper and lower  respiratory specimens dur ing the acute phase of infection.  Positive  results are indicative of active infection with SARS-CoV-2.  Clinical  correlation with patient history and other diagnostic information is  necessary to determine patient infection status.  Positive results do  not rule out bacterial infection or co-infection with other viruses. If result is PRESUMPTIVE POSTIVE SARS-CoV-2 nucleic acids MAY BE PRESENT.   A presumptive positive result was obtained on the submitted specimen  and confirmed on repeat testing.  While 2019 novel coronavirus  (SARS-CoV-2) nucleic acids may be present in the submitted sample  additional confirmatory testing may be necessary for epidemiological  and / or clinical management purposes  to differentiate between  SARS-CoV-2 and other Sarbecovirus currently known to infect humans.  If clinically indicated additional testing with an alternate test  methodology [phone]) is advised. The SARS-CoV-2 RNA is generally  detectable in upper and lower respiratory sp ecimens during the acute  phase of infection. The expected result is Negative. Fact Sheet for Patients:  StrictlyIdeas.no Fact Sheet for Healthcare Providers: BankingDealers.co.za This test is not yet approved or cleared by the [place] FDA and has been authorized for detection and/or diagnosis of SARS-CoV-2 by FDA under an Emergency Use Authorization (EUA).  This EUA will  remain in effect (meaning this test can be used) for the duration of the COVID-19 declaration under Section 564(b)(1) of the Act, 21 U.S.C. section 360bbb-3(b)(1), unless the authorization is terminated or revoked sooner. Performed at [facility], [place] [address] [place], [place] [address]      Time coordinating discharge: 36 minutes  SIGNED:   Hosie Poisson, MD  [facility] 05/10/2019, 4:46 PM Pager   If 7PM-7AM, please contact night-coverage [email or web address] Password [email or web address]

## 2019-05-13 LAB — PROTEIN ELECTRO, RANDOM URINE
Albumin ELP, Urine: 100 %
Alpha-1-Globulin, U: 0 %
Alpha-2-Globulin, U: 0 %
Beta Globulin, U: 0 %
Gamma Globulin, U: 0 %
Total Protein, Urine: <4 mg/dL

## 2019-08-23 DIAGNOSIS — R69 Illness, unspecified: Secondary | ICD-10-CM | POA: Diagnosis not present

## 2019-09-03 DIAGNOSIS — N183 Chronic kidney disease, stage 3 unspecified: Secondary | ICD-10-CM | POA: Diagnosis not present

## 2019-10-08 ENCOUNTER — Inpatient Hospital Stay (HOSPITAL_COMMUNITY)
Admission: EM | Admit: 2019-10-08 | Discharge: 2019-10-10 | DRG: 065 | Disposition: A | Discharge status: Home / Self Care | Payer: Medicare HMO | Attending: Internal Medicine | Admitting: Internal Medicine

## 2019-10-08 ENCOUNTER — Emergency Department (HOSPITAL_COMMUNITY): Payer: Medicare HMO

## 2019-10-08 ENCOUNTER — Encounter (HOSPITAL_COMMUNITY): Payer: Self-pay

## 2019-10-08 ENCOUNTER — Other Ambulatory Visit: Payer: Self-pay

## 2019-10-08 DIAGNOSIS — E785 Hyperlipidemia, unspecified: Secondary | ICD-10-CM | POA: Diagnosis present

## 2019-10-08 DIAGNOSIS — Z7984 Long term (current) use of oral hypoglycemic drugs: Secondary | ICD-10-CM | POA: Diagnosis not present

## 2019-10-08 DIAGNOSIS — R262 Difficulty in walking, not elsewhere classified: Secondary | ICD-10-CM | POA: Diagnosis not present

## 2019-10-08 DIAGNOSIS — I340 Nonrheumatic mitral (valve) insufficiency: Secondary | ICD-10-CM | POA: Diagnosis not present

## 2019-10-08 DIAGNOSIS — I129 Hypertensive chronic kidney disease with stage 1 through stage 4 chronic kidney disease, or unspecified chronic kidney disease: Secondary | ICD-10-CM | POA: Diagnosis not present

## 2019-10-08 DIAGNOSIS — N281 Cyst of kidney, acquired: Secondary | ICD-10-CM | POA: Diagnosis present

## 2019-10-08 DIAGNOSIS — Z23 Encounter for immunization: Secondary | ICD-10-CM | POA: Diagnosis not present

## 2019-10-08 DIAGNOSIS — I639 Cerebral infarction, unspecified: Secondary | ICD-10-CM | POA: Diagnosis not present

## 2019-10-08 DIAGNOSIS — I440 Atrioventricular block, first degree: Secondary | ICD-10-CM | POA: Diagnosis not present

## 2019-10-08 DIAGNOSIS — N184 Chronic kidney disease, stage 4 (severe): Secondary | ICD-10-CM | POA: Diagnosis not present

## 2019-10-08 DIAGNOSIS — E1122 Type 2 diabetes mellitus with diabetic chronic kidney disease: Secondary | ICD-10-CM | POA: Diagnosis present

## 2019-10-08 DIAGNOSIS — I700 Atherosclerosis of aorta: Secondary | ICD-10-CM | POA: Diagnosis not present

## 2019-10-08 DIAGNOSIS — G3184 Mild cognitive impairment, so stated: Secondary | ICD-10-CM | POA: Diagnosis not present

## 2019-10-08 DIAGNOSIS — Z20828 Contact with and (suspected) exposure to other viral communicable diseases: Secondary | ICD-10-CM | POA: Diagnosis not present

## 2019-10-08 DIAGNOSIS — I6389 Other cerebral infarction: Principal | ICD-10-CM | POA: Diagnosis present

## 2019-10-08 DIAGNOSIS — N400 Enlarged prostate without lower urinary tract symptoms: Secondary | ICD-10-CM | POA: Diagnosis present

## 2019-10-08 DIAGNOSIS — G8314 Monoplegia of lower limb affecting left nondominant side: Secondary | ICD-10-CM | POA: Diagnosis present

## 2019-10-08 DIAGNOSIS — I10 Essential (primary) hypertension: Secondary | ICD-10-CM | POA: Diagnosis present

## 2019-10-08 DIAGNOSIS — E119 Type 2 diabetes mellitus without complications: Secondary | ICD-10-CM

## 2019-10-08 DIAGNOSIS — R29701 NIHSS score 1: Secondary | ICD-10-CM | POA: Diagnosis present

## 2019-10-08 DIAGNOSIS — Z79899 Other long term (current) drug therapy: Secondary | ICD-10-CM

## 2019-10-08 HISTORY — DX: Benign prostatic hyperplasia without lower urinary tract symptoms: N40.0

## 2019-10-08 HISTORY — DX: Hyperlipidemia, unspecified: E78.5

## 2019-10-08 HISTORY — DX: Type 2 diabetes mellitus without complications: E11.9

## 2019-10-08 HISTORY — DX: Essential (primary) hypertension: I10

## 2019-10-08 LAB — RAPID URINE DRUG SCREEN, HOSP PERFORMED
Amphetamines: NOT DETECTED
Barbiturates: NOT DETECTED
Benzodiazepines: NOT DETECTED
Cocaine: NOT DETECTED
Opiates: NOT DETECTED
Tetrahydrocannabinol: NOT DETECTED

## 2019-10-08 LAB — DIFFERENTIAL
Abs Immature Granulocytes: 0.02 10*3/uL (ref 0.00–0.07)
Basophils Absolute: 0 10*3/uL (ref 0.0–0.1)
Basophils Relative: 0 %
Eosinophils Absolute: 0 10*3/uL (ref 0.0–0.5)
Eosinophils Relative: 1 %
Immature Granulocytes: 0 %
Lymphocytes Relative: 28 %
Lymphs Abs: 1.8 10*3/uL (ref 0.7–4.0)
Monocytes Absolute: 0.4 10*3/uL (ref 0.1–1.0)
Monocytes Relative: 7 %
Neutro Abs: 4 10*3/uL (ref 1.7–7.7)
Neutrophils Relative %: 64 %

## 2019-10-08 LAB — URINALYSIS, ROUTINE W REFLEX MICROSCOPIC
Bilirubin Urine: NEGATIVE
Glucose, UA: NEGATIVE mg/dL
Ketones, ur: NEGATIVE mg/dL
Leukocytes,Ua: NEGATIVE
Nitrite: NEGATIVE
Protein, ur: NEGATIVE mg/dL
Specific Gravity, Urine: 1.013 (ref 1.005–1.030)
pH: 5 (ref 5.0–8.0)

## 2019-10-08 LAB — CBC
HCT: 40.5 % (ref 39.0–52.0)
Hemoglobin: 13 g/dL (ref 13.0–17.0)
MCH: 31.4 pg (ref 26.0–34.0)
MCHC: 32.1 g/dL (ref 30.0–36.0)
MCV: 97.8 fL (ref 80.0–100.0)
Platelets: 270 10*3/uL (ref 150–400)
RBC: 4.14 MIL/uL — ABNORMAL LOW (ref 4.22–5.81)
RDW: 13.1 % (ref 11.5–15.5)
WBC: 6.3 10*3/uL (ref 4.0–10.5)
nRBC: 0 % (ref 0.0–0.2)

## 2019-10-08 LAB — COMPREHENSIVE METABOLIC PANEL
ALT: 16 U/L (ref 0–44)
AST: 18 U/L (ref 15–41)
Albumin: 4.3 g/dL (ref 3.5–5.0)
Alkaline Phosphatase: 60 U/L (ref 38–126)
Anion gap: 10 (ref 5–15)
BUN: 31 mg/dL — ABNORMAL HIGH (ref 8–23)
CO2: 23 mmol/L (ref 22–32)
Calcium: 9.5 mg/dL (ref 8.9–10.3)
Chloride: 106 mmol/L (ref 98–111)
Creatinine, Ser: 1.92 mg/dL — ABNORMAL HIGH (ref 0.61–1.24)
GFR calc Af Amer: 36 mL/min — ABNORMAL LOW (ref >60)
GFR calc non Af Amer: 31 mL/min — ABNORMAL LOW (ref >60)
Glucose, Bld: 109 mg/dL — ABNORMAL HIGH (ref 70–99)
Potassium: 4.1 mmol/L (ref 3.5–5.1)
Sodium: 139 mmol/L (ref 135–145)
Total Bilirubin: 0.7 mg/dL (ref 0.3–1.2)
Total Protein: 7.5 g/dL (ref 6.5–8.1)

## 2019-10-08 LAB — APTT: aPTT: 30 seconds (ref 24–36)

## 2019-10-08 LAB — ETHANOL: Alcohol, Ethyl (B): <10 mg/dL (ref <10)

## 2019-10-08 LAB — GLUCOSE, CAPILLARY: Glucose-Capillary: 137 mg/dL — ABNORMAL HIGH (ref 70–99)

## 2019-10-08 LAB — PROTIME-INR
INR: 0.9 (ref 0.8–1.2)
Prothrombin Time: 12.3 seconds (ref 11.4–15.2)

## 2019-10-08 LAB — HEMOGLOBIN A1C
Hgb A1c MFr Bld: 7.9 % — ABNORMAL HIGH (ref 4.8–5.6)
Mean Plasma Glucose: 180.03 mg/dL

## 2019-10-08 LAB — SARS CORONAVIRUS 2 (TAT 6-24 HRS): SARS Coronavirus 2: NEGATIVE

## 2019-10-08 IMAGING — CT CT HEAD W/O CM
3 series · 15 of 47 positions shown, 18 images · non-contrast
Comparison: [DATE].

CLINICAL DATA: Difficulty walking.

EXAM:
CT HEAD WITHOUT CONTRAST
TECHNIQUE: Contiguous axial images were obtained from the base of the skull
through the vertex without intravenous contrast.

[Series 2: head wo · axial · 0.46mm/px · z∈[+795,+925]mm · 9 of 32 slices shown, 12 images]
[im 3/32  brain]
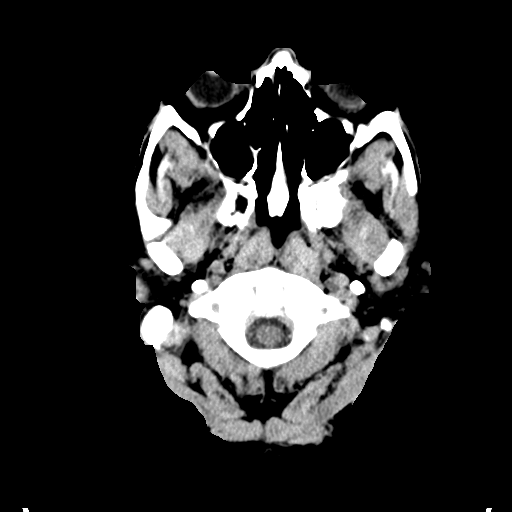
[im 3/32  bone]
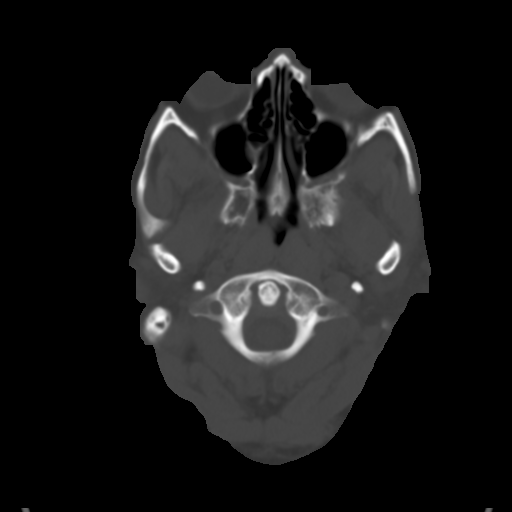
[im 6/32  brain]
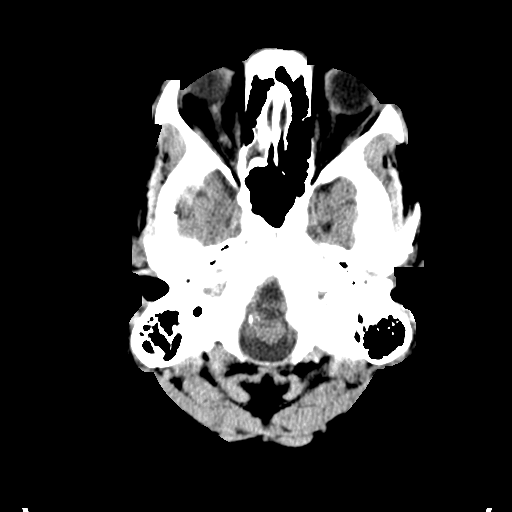
[im 9/32  brain]
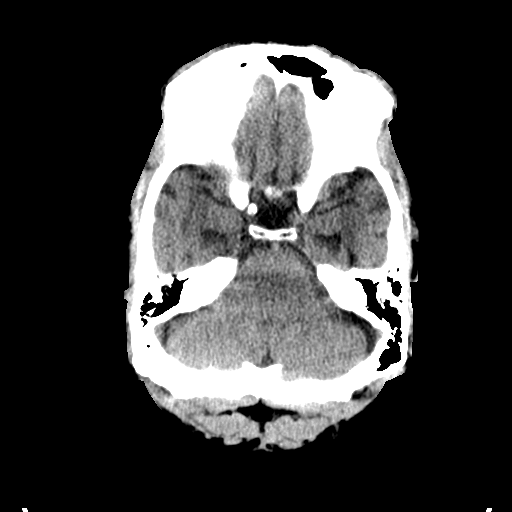
[im 12/32  brain]
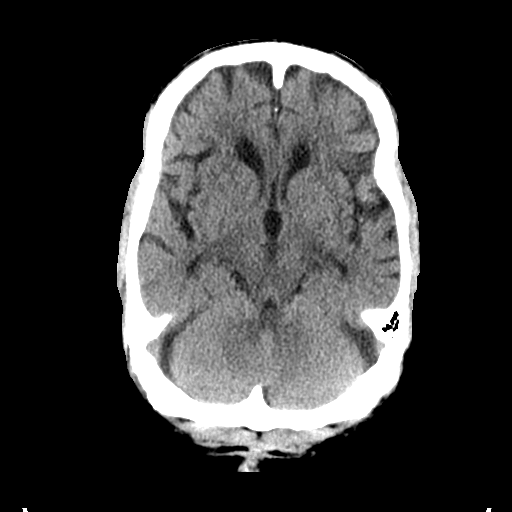
[im 17/32  brain]
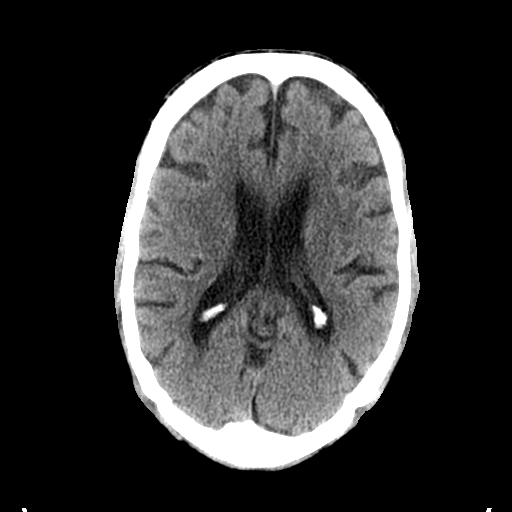
[im 17/32  bone]
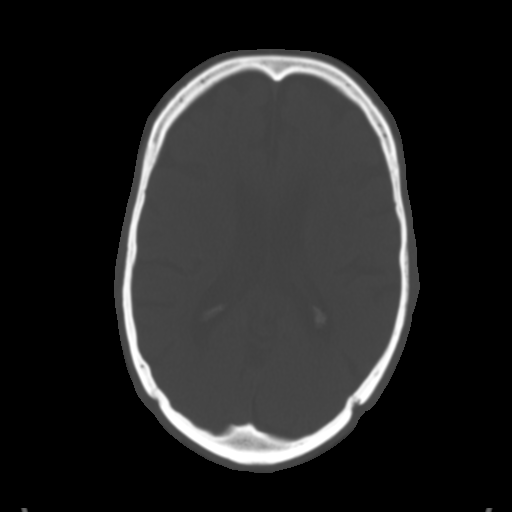
[im 20/32  brain]
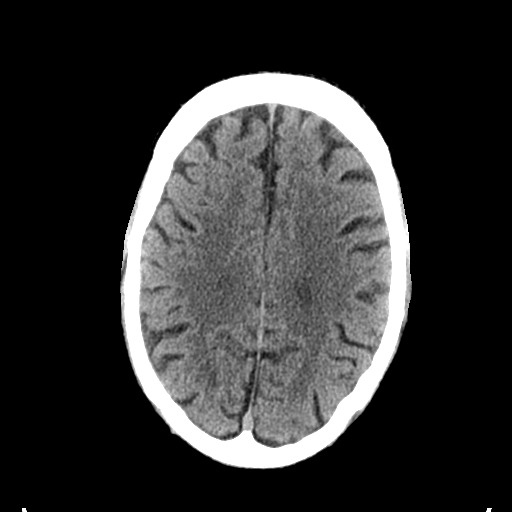
[im 23/32  brain]
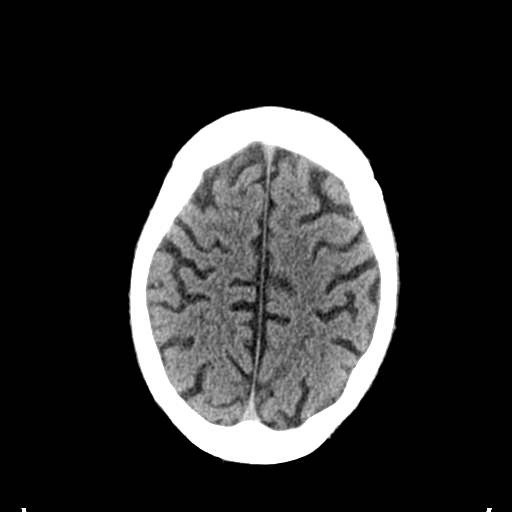
[im 26/32  brain]
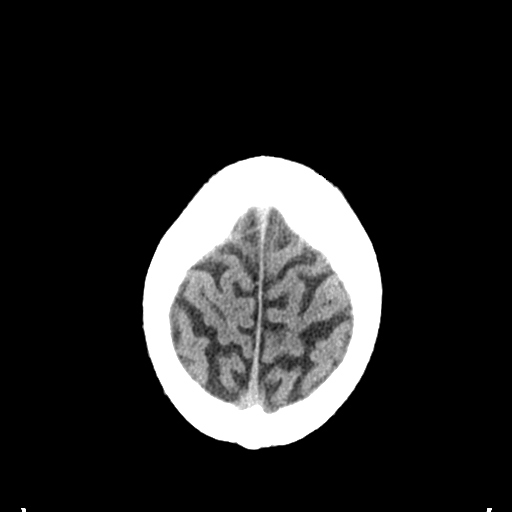
[im 29/32  brain]
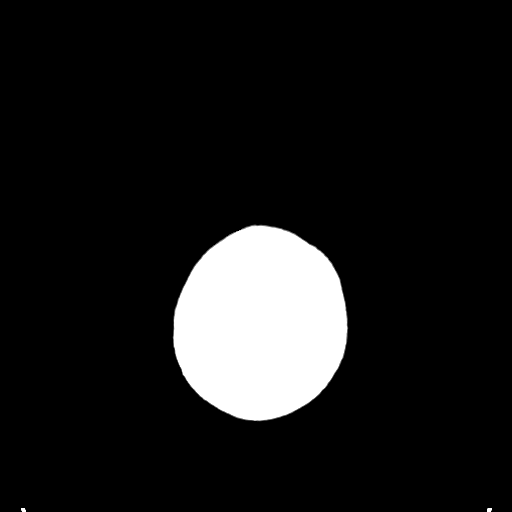
[im 29/32  bone]
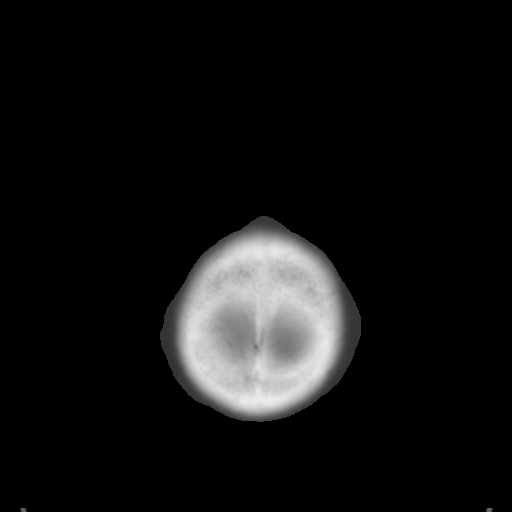

[Series 4: coronal soft tissue · coronal · 0.34mm/px · 3 of 80 slices shown]
[im 27/80  brain]
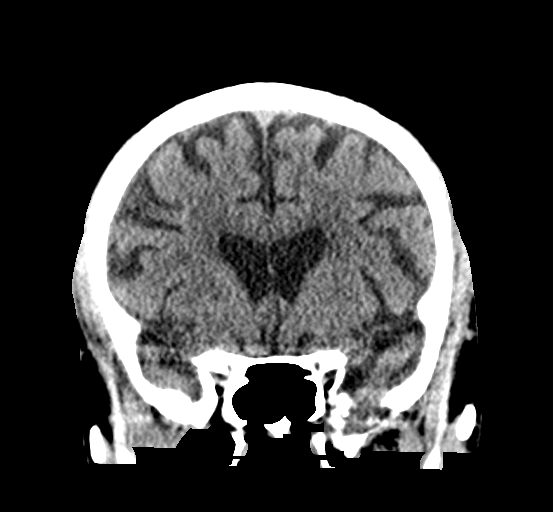
[im 36/80  brain]
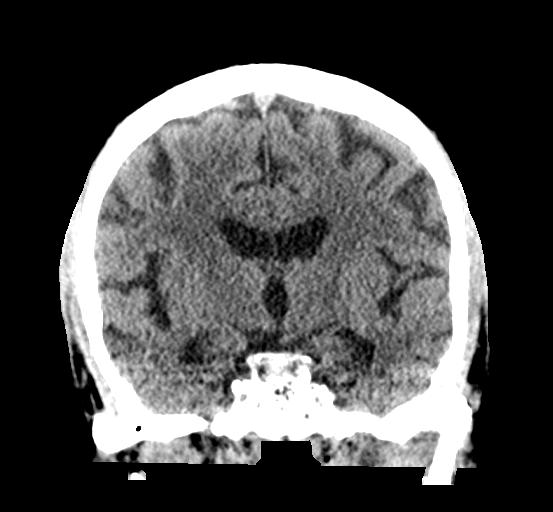
[im 44/80  brain]
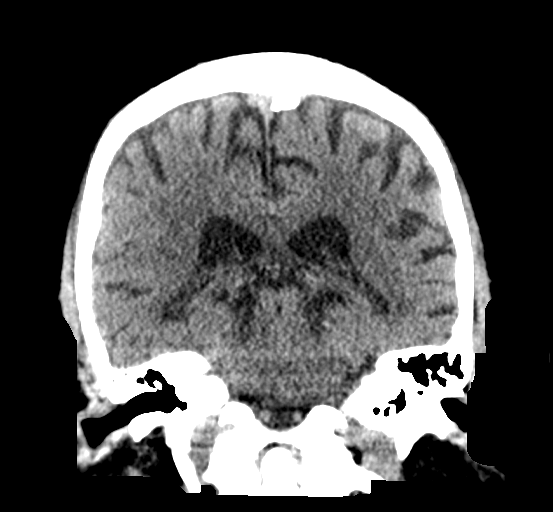

[Series 5: sagittal soft tissue · sagittal · 0.34mm/px · 3 of 60 slices shown]
[im 20/60  brain]
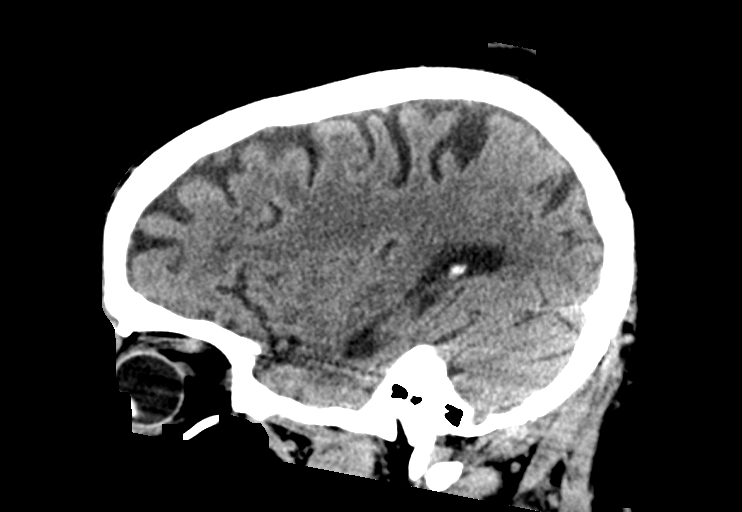
[im 30/60  brain]
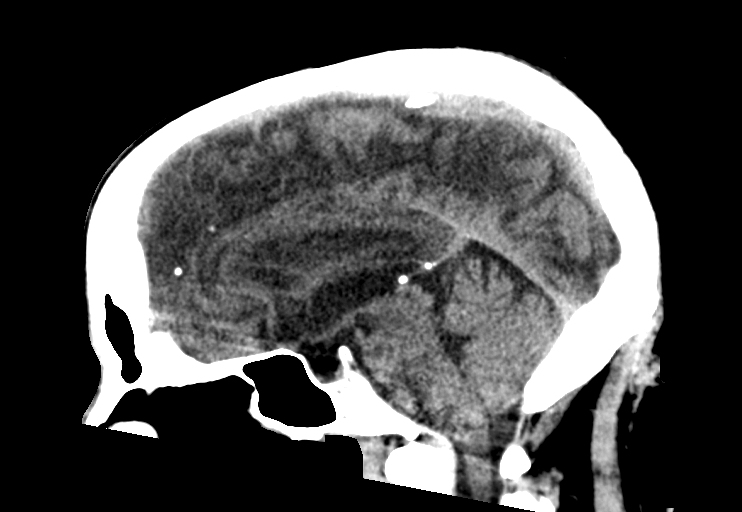
[im 40/60  brain]
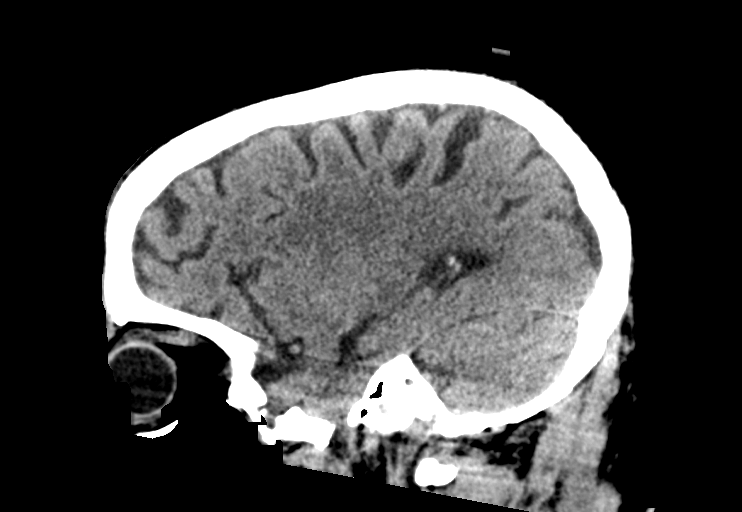

[15 of 47 positions shown; findings below may reference images not displayed]

FINDINGS: Brain: Mild chronic ischemic white matter disease is noted. No mass
effect or midline shift is noted. Ventricular size is within normal
limits. There is no evidence of mass lesion, hemorrhage or acute
infarction.

Vascular: No hyperdense vessel or unexpected calcification.

Skull: Normal. Negative for fracture or focal lesion.

Sinuses/Orbits: No acute finding.

Other: None.
IMPRESSION: Mild chronic ischemic white matter disease. No acute intracranial
abnormality seen.

## 2019-10-08 IMAGING — MR MR HEAD W/O CM
9 of 10 series · 38 of 48 positions shown · non-contrast
Comparison: CT same day.  CT [DATE].

CLINICAL DATA: Awoke today with weakness in the left leg.

EXAM:
MRI HEAD WITHOUT CONTRAST
TECHNIQUE: Multiplanar, multiecho pulse sequences of the brain and surrounding
structures were obtained without intravenous contrast.

[Series 3: DWI · axial · 3.0mm · 1.09mm/px · z∈[-15,+132]mm · 9 of 100 slices shown (1 of 4)]
[im 1/100]
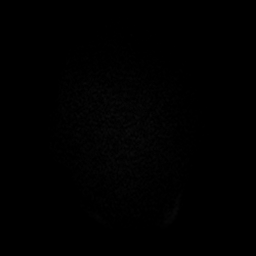
[im 13/100]
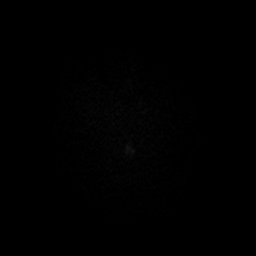
[im 25/100]
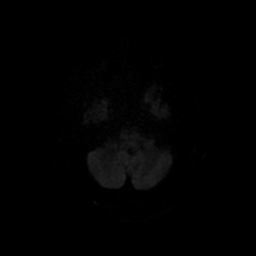
[im 38/100]
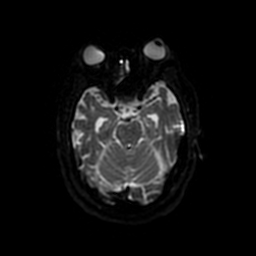
[im 50/100]
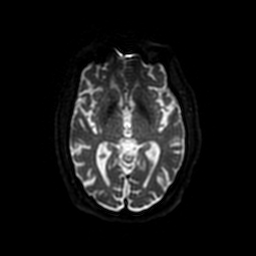
[im 62/100]
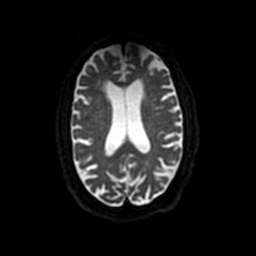
[im 75/100]
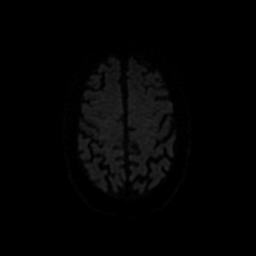
[im 87/100]
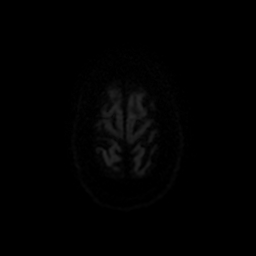
[im 100/100]
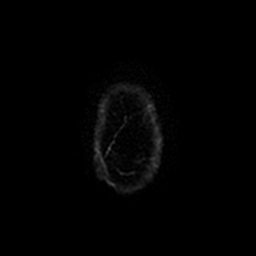

[Series 4: T1 · sagittal · 5.0mm · 0.47mm/px · 1 of 22 slices shown (1 of 2)]
[im 1/22]
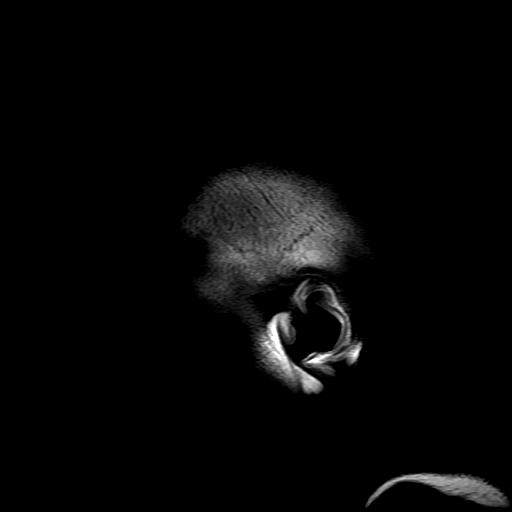

[Series 5: T2 · axial · 5.0mm · 0.43mm/px · z∈[-14,+135]mm · 3 of 26 slices shown (1 of 2)]
[im 1/26]
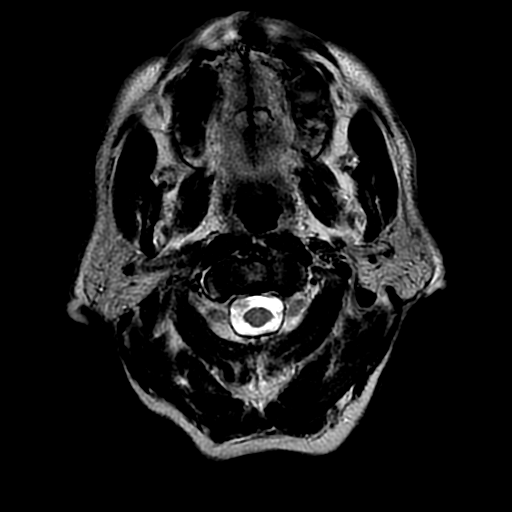
[im 13/26]
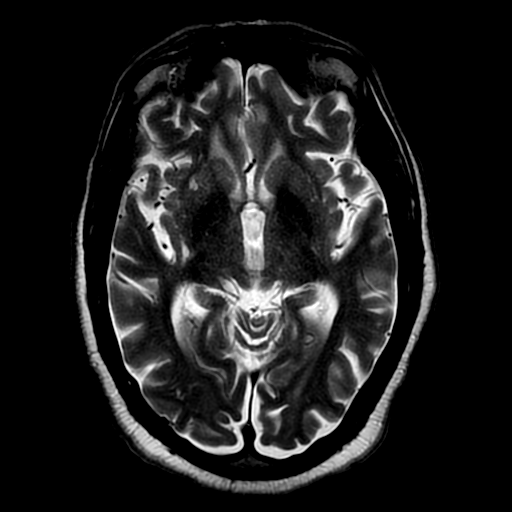
[im 26/26]
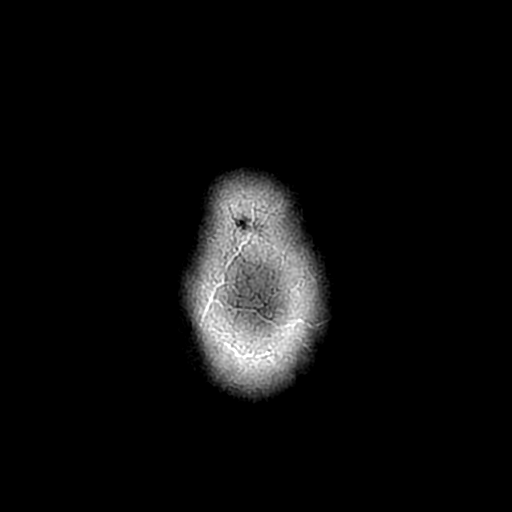

[Series 6: FLAIR · axial · 3.0mm · 0.43mm/px · z∈[-14,+135]mm · 3 of 26 slices shown]
[im 1/26]
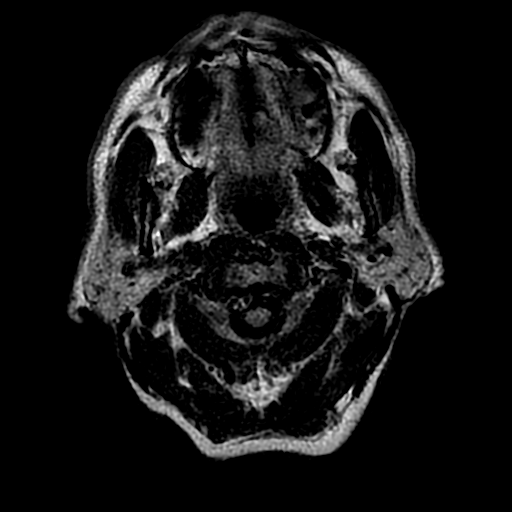
[im 13/26]
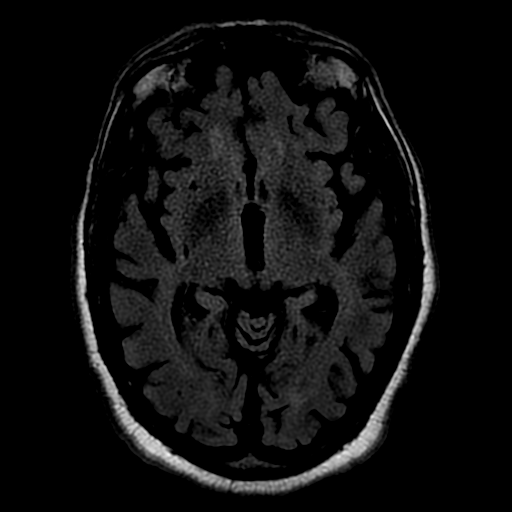
[im 26/26]
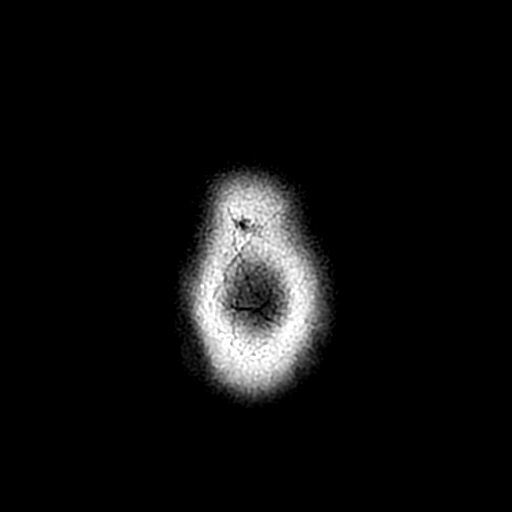

[Series 8: T1 · axial · 2.0mm · 0.47mm/px · 1 of 78 slices shown (2 of 2)]
[im 1/78]
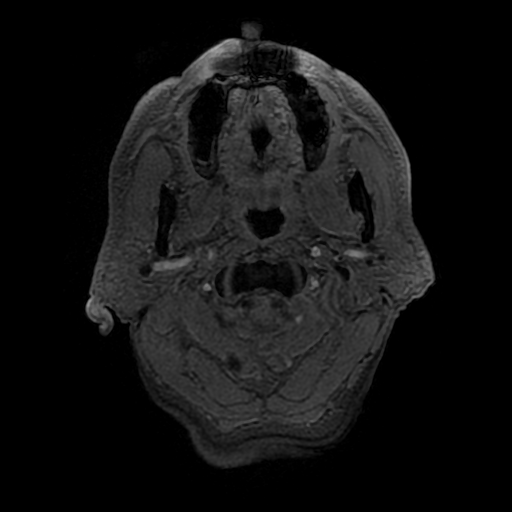

[Series 9: DWI · coronal · 4.0mm · 1.09mm/px · 9 of 92 slices shown (2 of 4)]
[im 1/92]
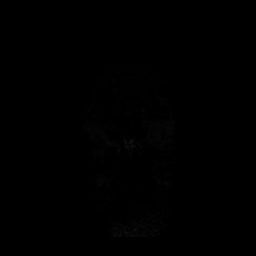
[im 12/92]
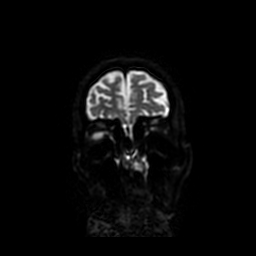
[im 23/92]
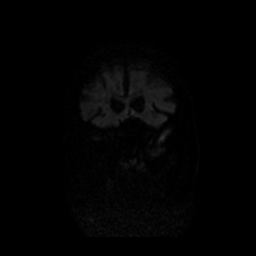
[im 35/92]
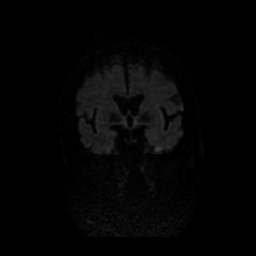
[im 46/92]
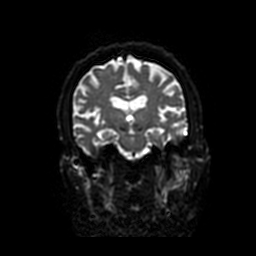
[im 57/92]
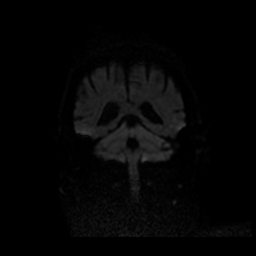
[im 69/92]
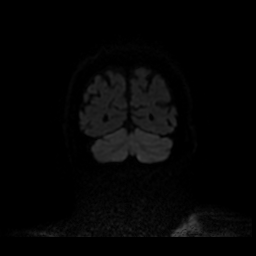
[im 80/92]
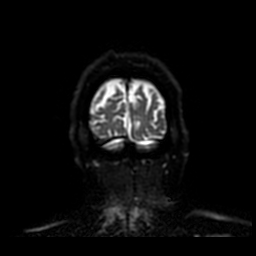
[im 92/92]
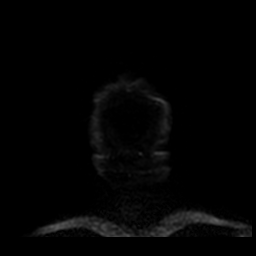

[Series 10: T2 · coronal · 5.0mm · 0.45mm/px · 3 of 29 slices shown (2 of 2)]
[im 1/29]
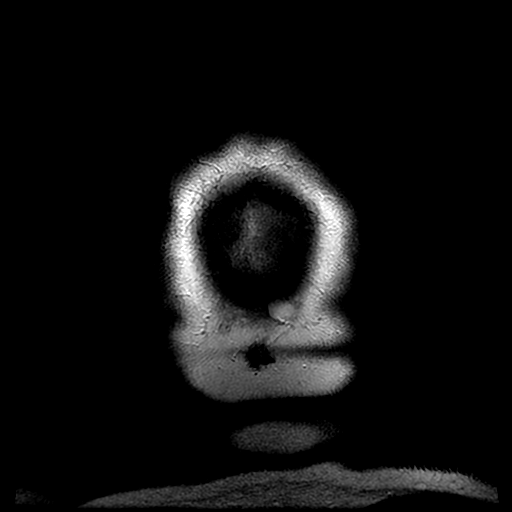
[im 15/29]
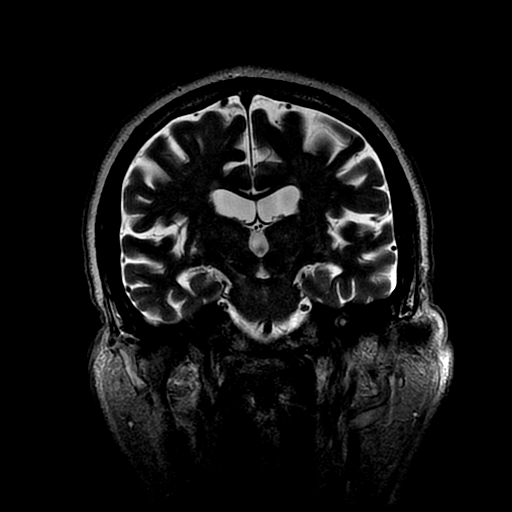
[im 29/29]
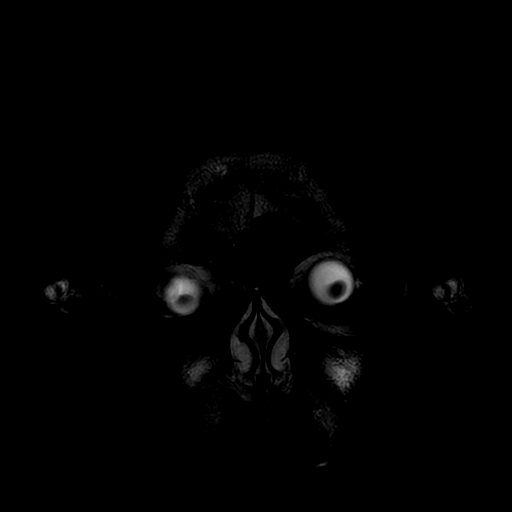

[Series 300: DWI · axial · 3.0mm · 1.09mm/px · z∈[-15,+132]mm · 5 of 50 slices shown (3 of 4)]
[im 1/50]
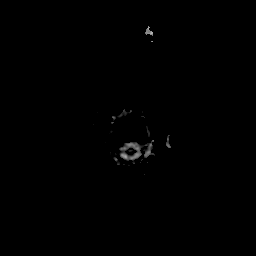
[im 13/50]
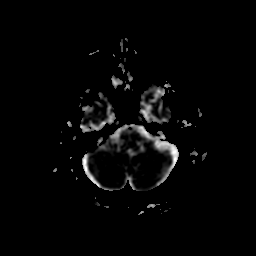
[im 25/50]
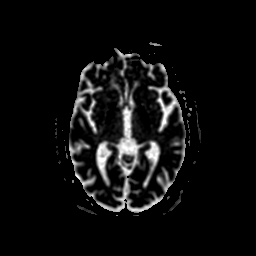
[im 37/50]
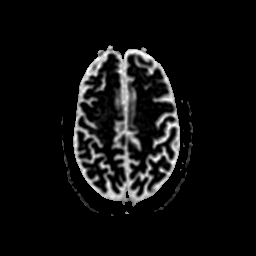
[im 50/50]
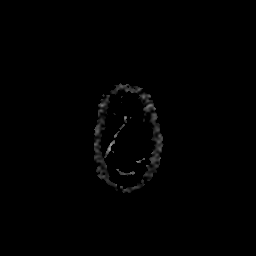

[Series 900: DWI · coronal · 4.0mm · 1.09mm/px · 4 of 46 slices shown (4 of 4)]
[im 1/46]
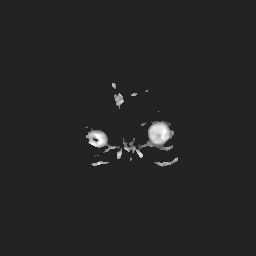
[im 16/46]
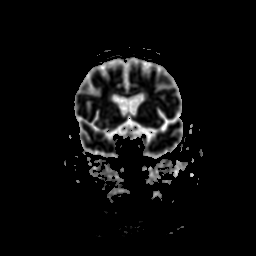
[im 31/46]
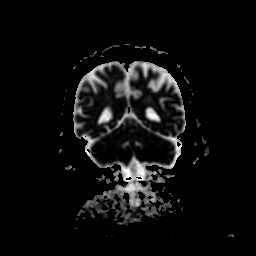
[im 46/46]
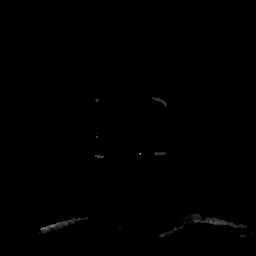

[38 of 48 positions shown; findings below may reference images not displayed]

FINDINGS: Brain: Diffusion imaging shows a small region of restricted
diffusion along the right lateral thalamus/posterior limb internal
capsule. This probably explains the clinical presentation. No other
acute or subacute infarction. Elsewhere, brainstem shows chronic
small-vessel ischemic changes of the pons. No focal cerebellar
insult. Cerebral hemispheres show mild chronic small-vessel change
of the white matter for a person of this age. No large vessel
territory infarction. No mass lesion, hemorrhage, hydrocephalus or
extra-axial collection.

Vascular: Major vessels at the base of the brain show flow.

Skull and upper cervical spine: Negative

Sinuses/Orbits: Sinuses are clear except for opacification of a
posterior ethmoid air cell on the right. Orbits are negative.

Other: None
IMPRESSION: Small region of acute infarction on the right affecting the lateral
thalamus/posterior limb internal capsule.

Age related chronic small-vessel ischemic changes elsewhere
throughout the brain as above.

## 2019-10-08 MED ORDER — LINAGLIPTIN 5 MG PO TABS
5.0000 mg | ORAL_TABLET | Freq: Every day | ORAL | Status: DC
  Administered 2019-10-09: 5 mg via ORAL
  Filled 2019-10-08 (×2): qty 1

## 2019-10-08 MED ORDER — ASPIRIN EC 81 MG PO TBEC
81.0000 mg | DELAYED_RELEASE_TABLET | Freq: Every day | ORAL | Status: DC
  Administered 2019-10-09: 81 mg via ORAL
  Filled 2019-10-08 (×2): qty 1

## 2019-10-08 MED ORDER — SIMVASTATIN 20 MG PO TABS
20.0000 mg | ORAL_TABLET | Freq: Every day | ORAL | Status: DC
  Administered 2019-10-08: 20 mg via ORAL
  Filled 2019-10-08: qty 1

## 2019-10-08 MED ORDER — CLOPIDOGREL BISULFATE 75 MG PO TABS
75.0000 mg | ORAL_TABLET | Freq: Once | ORAL | Status: AC
  Administered 2019-10-08: 75 mg via ORAL
  Filled 2019-10-08: qty 1

## 2019-10-08 MED ORDER — SODIUM BICARBONATE 650 MG PO TABS
650.0000 mg | ORAL_TABLET | Freq: Two times a day (BID) | ORAL | Status: DC
  Administered 2019-10-08 – 2019-10-09 (×3): 650 mg via ORAL
  Filled 2019-10-08 (×4): qty 1

## 2019-10-08 MED ORDER — STROKE: EARLY STAGES OF RECOVERY BOOK
Freq: Once | Status: DC
  Filled 2019-10-08 (×2): qty 1

## 2019-10-08 MED ORDER — PNEUMOCOCCAL VAC POLYVALENT 25 MCG/0.5ML IJ INJ
0.5000 mL | INJECTION | INTRAMUSCULAR | Status: AC
  Administered 2019-10-09: 0.5 mL via INTRAMUSCULAR
  Filled 2019-10-08: qty 0.5

## 2019-10-08 MED ORDER — CLOPIDOGREL BISULFATE 75 MG PO TABS
75.0000 mg | ORAL_TABLET | Freq: Every day | ORAL | Status: DC
  Administered 2019-10-09: 75 mg via ORAL
  Filled 2019-10-08 (×2): qty 1

## 2019-10-08 MED ORDER — GLIMEPIRIDE 1 MG PO TABS
1.0000 mg | ORAL_TABLET | Freq: Every day | ORAL | Status: DC
  Administered 2019-10-09: 1 mg via ORAL
  Filled 2019-10-08 (×2): qty 1

## 2019-10-08 MED ORDER — STROKE: EARLY STAGES OF RECOVERY BOOK
Status: AC
  Filled 2019-10-08: qty 1

## 2019-10-08 MED ORDER — ACETAMINOPHEN 650 MG RE SUPP: 650.0000 mg | Freq: Four times a day (QID) | RECTAL | Status: DC | PRN

## 2019-10-08 MED ORDER — FERROUS GLUCONATE 324 (38 FE) MG PO TABS
648.0000 mg | ORAL_TABLET | Freq: Every day | ORAL | Status: DC
  Administered 2019-10-09: 648 mg via ORAL
  Filled 2019-10-08 (×2): qty 2

## 2019-10-08 MED ORDER — ACETAMINOPHEN 325 MG PO TABS: 650.0000 mg | ORAL_TABLET | Freq: Four times a day (QID) | ORAL | Status: DC | PRN

## 2019-10-08 MED ORDER — TAMSULOSIN HCL 0.4 MG PO CAPS
0.4000 mg | ORAL_CAPSULE | Freq: Every day | ORAL | Status: DC
  Administered 2019-10-08 – 2019-10-09 (×2): 0.4 mg via ORAL
  Filled 2019-10-08 (×2): qty 1

## 2019-10-08 MED ORDER — ENOXAPARIN SODIUM 40 MG/0.4ML ~~LOC~~ SOLN: 40.0000 mg | SUBCUTANEOUS | Status: DC

## 2019-10-08 MED ORDER — ASPIRIN EC 81 MG PO TBEC
81.0000 mg | DELAYED_RELEASE_TABLET | Freq: Once | ORAL | Status: AC
  Administered 2019-10-08: 81 mg via ORAL
  Filled 2019-10-08: qty 1

## 2019-10-08 NOTE — ED Provider Notes (Addendum)
[facility] DEPT Provider Note   CSN: [id number] Arrival date & time: 10/08/19  0756     History Chief Complaint  Patient presents with  . Difficulty Walking    Erik Moss is a 85 y.o. male.  Patient is an 85 year old male with a history of diabetes, chronic kidney disease who lives with his daughter and presenting today with weakness in his left leg.  Patient states that yesterday he felt fine and he went to bed at 8 PM last night and seemed his normal self.  He woke up around 10 or 11 PM to go to the bathroom and had no difficulty.  However when he woke up this morning at 6 AM he noticed that his left leg was not working and it was difficult for him to walk.  He was able to get out of bed and go to the bathroom and clean up but then was unable to walk down the steps so had to sit down and scoot down them.  Patient's daughter noted that his left leg was dragging and he needed help to walk.  He does complain of some mild discomfort in the left buttocks but denies any sensation change.  He denies any slurred speech, facial droop or upper extremity weakness.  Daughter verifies this.  No recent medication changes.  No chest pain, abdominal pain, fever, cough, shortness of breath or visual changes.  No headaches or recent trauma or fall.  Daughter states on Sunday they did go to the zoo and walked for about 1-1/2 to 2 hours but did take frequent breaks.  The history is provided by the patient.       Past Medical History:  Diagnosis Date  . Diabetes mellitus without complication [facility])     Patient Active Problem List   Diagnosis Date Noted  . ARF (acute renal failure) ([place]) 05/08/2019    History reviewed. No pertinent surgical history.     History reviewed. No pertinent family history.  Social History   Tobacco Use  . Smoking status: Never Smoker  . Smokeless tobacco: Never Used  Substance Use Topics  . Alcohol use: Never  . Drug use: Never     Home Medications Prior to Admission medications   Medication Sig Start Date End Date Taking? Authorizing Provider  amLODipine (NORVASC) 5 MG tablet Take 5 mg by mouth daily.    [provider]  ferrous gluconate (FERGON) 324 MG tablet Take 648 mg by mouth daily.     [provider]  glimepiride (AMARYL) 1 MG tablet Take 1 mg by mouth daily with breakfast.    [provider]  Multiple Vitamin (MULTIVITAMIN WITH MINERALS) TABS tablet Take 1 tablet by mouth daily.    [provider]  simvastatin (ZOCOR) 20 MG tablet Take 20 mg by mouth every evening.     [provider]  sitaGLIPtin (JANUVIA) 25 MG tablet Take 25 mg by mouth daily.    [provider]  tamsulosin (FLOMAX) 0.4 MG CAPS capsule Take 0.4 mg by mouth at bedtime.     [provider]  triamcinolone ointment (KENALOG) 0.1 % Apply 1 application topically daily as needed (itching on lower legs.).     [provider]    Allergies    Patient has no known allergies.  Review of Systems   Review of Systems  All other systems reviewed and are negative.   Physical Exam Updated Vital Signs BP (!) 167/82 (BP Location: Left Arm)  Pulse 86   Temp 97.8 F (36.6 C) (Oral)   Resp 18   SpO2 100%   Physical Exam Vitals and nursing note reviewed.  Constitutional:      General: He is not in acute distress.    Appearance: Normal appearance. He is well-developed and normal weight.  HENT:     Head: Normocephalic and atraumatic.  Eyes:     General: No visual field deficit.    Conjunctiva/sclera: Conjunctivae normal.     Pupils: Pupils are equal, round, and reactive to light.  Cardiovascular:     Rate and Rhythm: Normal rate and regular rhythm.     Pulses: Normal pulses.     Heart sounds: No murmur.  Pulmonary:     Effort: Pulmonary effort is normal. No respiratory distress.     Breath sounds: Normal breath sounds. No wheezing or rales.  Abdominal:      General: There is no distension.     Palpations: Abdomen is soft.     Tenderness: There is no abdominal tenderness. There is no guarding or rebound.  Musculoskeletal:        General: No tenderness. Normal range of motion.     Cervical back: Normal range of motion and neck supple.  Skin:    General: Skin is warm and dry.     Findings: No erythema or rash.  Neurological:     Mental Status: He is alert and oriented to person, place, and time.     Cranial Nerves: No cranial nerve deficit or facial asymmetry.     Sensory: No sensory deficit.     Coordination: Coordination is intact.     Comments: When patient ambulates he favors the right side and will occasionally have to steady himself when stepping with the left foot.  Upper extremity strength is 5 out of 5 with no pronator drift.  Lower extremities the right leg is 5 out of 5 with no pronator drift, left leg is 4-1/2 out of 5 with minimal pronator drift.  Sensation in the legs is intact.  Speech is normal  Psychiatric:        Mood and Affect: Mood normal.        Behavior: Behavior normal.        Thought Content: Thought content normal.     ED Results / Procedures / Treatments   Labs (all labs ordered are listed, but only abnormal results are displayed) Labs Reviewed  CBC - Abnormal; Notable for the following components:      Result Value   RBC 4.14 (*)    All other components within normal limits  COMPREHENSIVE METABOLIC PANEL - Abnormal; Notable for the following components:   Glucose, Bld 109 (*)    BUN 31 (*)    Creatinine, Ser 1.92 (*)    GFR calc non Af Amer 31 (*)    GFR calc Af Amer 36 (*)    All other components within normal limits  URINALYSIS, ROUTINE W REFLEX MICROSCOPIC - Abnormal; Notable for the following components:   Hgb urine dipstick SMALL (*)    Bacteria, UA RARE (*)    All other components within normal limits  ETHANOL  PROTIME-INR  APTT  DIFFERENTIAL  RAPID URINE DRUG SCREEN, HOSP PERFORMED     EKG None  ED ECG REPORT   Date: 10/08/2019  Rate: 76  Rhythm: normal sinus rhythm  QRS Axis: normal  Intervals: normal  ST/T Wave abnormalities: normal  Conduction Disutrbances:first-degree A-V block   Narrative  Interpretation:   Old EKG Reviewed: unchanged  I have personally reviewed the EKG tracing and agree with the computerized printout as noted.   Radiology CT HEAD WO CONTRAST  Result Date: 10/08/2019 CLINICAL DATA:  Difficulty walking. EXAM: CT HEAD WITHOUT CONTRAST TECHNIQUE: Contiguous axial images were obtained from the base of the skull through the vertex without intravenous contrast. COMPARISON:  May 08, 2019. FINDINGS: Brain: Mild chronic ischemic white matter disease is noted. No mass effect or midline shift is noted. Ventricular size is within normal limits. There is no evidence of mass lesion, hemorrhage or acute infarction. Vascular: No hyperdense vessel or unexpected calcification. Skull: Normal. Negative for fracture or focal lesion. Sinuses/Orbits: No acute finding. Other: None. IMPRESSION: Mild chronic ischemic white matter disease. No acute intracranial abnormality seen. Electronically Signed   By: Marijo Conception M.D.   On: 10/08/2019 10:48   MR BRAIN WO CONTRAST  Result Date: 10/08/2019 CLINICAL DATA:  Awoke today with weakness in the left leg. EXAM: MRI HEAD WITHOUT CONTRAST TECHNIQUE: Multiplanar, multiecho pulse sequences of the brain and surrounding structures were obtained without intravenous contrast. COMPARISON:  CT same day.  CT 05/08/2019. FINDINGS: Brain: Diffusion imaging shows a small region of restricted diffusion along the right lateral thalamus/posterior limb internal capsule. This probably explains the clinical presentation. No other acute or subacute infarction. Elsewhere, brainstem shows chronic small-vessel ischemic changes of the pons. No focal cerebellar insult. Cerebral hemispheres show mild chronic small-vessel change of the white matter  for a person of this age. No large vessel territory infarction. No mass lesion, hemorrhage, hydrocephalus or extra-axial collection. Vascular: Major vessels at the base of the brain show flow. Skull and upper cervical spine: Negative Sinuses/Orbits: Sinuses are clear except for opacification of a posterior ethmoid air cell on the right. Orbits are negative. Other: None IMPRESSION: Small region of acute infarction on the right affecting the lateral thalamus/posterior limb internal capsule. Age related chronic small-vessel ischemic changes elsewhere throughout the brain as above. Electronically Signed   By: Nelson Chimes M.D.   On: 10/08/2019 14:46    Procedures Procedures (including critical care time)  Medications Ordered in ED Medications - No data to display  ED Course  I have reviewed the triage vital signs and the nursing notes.  Pertinent labs & imaging results that were available during my care of the patient were reviewed by me and considered in my medical decision making (see chart for details).    MDM Rules/Calculators/A&P                      Elderly male with multiple medical problems presenting today with weakness in the left lower extremity.  This seems to be localized to the left lower extremity and does have some minimal discomfort in the left buttocks.  Pulse in the left foot is normal and sensation is intact.  Patient states his strength is much improved now than what it was at home.  He is able to walk but still his leg will occasionally give on the left.  He has no other findings of weakness, facial droop or other neuro deficits.  Concern for stroke versus lumbar radiculopathy.  Daughter verifies the story.  At this time NIH scale of 1 and a code stroke was not called.  We will do stroke work-up as well as head CT.  2:54 PM Labs are reassuring with unchanged chronic kidney disease.  EKG with first-degree AV block but otherwise within normal limits.  Head CT with chronic  ischemic changes but nothing acute.  On reevaluation patient symptoms have improved even more.  Patient was able to stand and ambulate with possible slight deficits still noted.  Discussed with patient getting an MRI to completely rule out infarct and he and his daughter are okay with this.  MRI did show a small region of acute infarction on the right affecting the lateral thalamus posterior limb of the internal capsule.  Will discuss with neurology.  3:31 PM Spoke with Dr. Lorraine Lax who recommended stroke admit.  81 mg aspirin and Plavix if patient had no contraindication. No hx of intracranial or GI bleeding in the past. They would like patient to be moved to [facility] if a bed becomes available but if not they will plan on seeing him at [facility] tomorrow if he is unable to be moved.  Final Clinical Impression(s) / ED Diagnoses Final diagnoses:  Cerebrovascular accident (CVA), unspecified mechanism [facility])    Rx / DC Orders ED Discharge Orders    None       Blanchie Dessert, MD 10/08/19 1531    Blanchie Dessert, MD 10/08/19 [phone]

## 2019-10-08 NOTE — ED Notes (Signed)
Spoke to MRI. MRI will be here in 10 minutes. Patient requested to dress out into gown.

## 2019-10-08 NOTE — ED Notes (Signed)
Transported to MRI via wheelchair by [employer]

## 2019-10-08 NOTE — ED Notes (Signed)
[facility] called to speak with provider. Erik Moss they went the zoo on Sunday and pt walked longer than usual, about 1.5 hours. "Since he moved in with [place] in May or June, he's been more stooped over. He woke up this morning and said he couldn't walk. He seemed to be dragging his left leg when he walked to the car. His kidney doctor said one of his numbers were not right. I switched him to a vegetarian diet. I'm trying my best with his diet."

## 2019-10-08 NOTE — ED Notes (Signed)
Patient transported to CT 

## 2019-10-08 NOTE — ED Notes (Signed)
[facility] has been notified for transport to [facility], and report given to [place], RN on 3W.

## 2019-10-08 NOTE — ED Triage Notes (Signed)
Pt presents with c/o difficulty walking this morning that has now resolved itself. Pt reports that when he woke up this morning, he was having a hard time walking and most especially felt like his left leg was dragging. Pt denies any injury or pain.

## 2019-10-08 NOTE — ED Notes (Addendum)
Pt said he woke up this morning with weakness in his left leg. His daughter and granddaughter assisted him to the car and brought him to ED. He is unsteady on his feet. Provider assessing pt now. His daughter is [name] Buchholz and her cell phone is [phone].

## 2019-10-08 NOTE — H&P (Signed)
History and Physical    Erik Moss [id number] DOB: [birth year] DOA: 10/08/2019  PCP: [name], FNP   Patient coming from: Home.  I have personally briefly reviewed patient's old medical records in [place]  Chief Complaint: Difficulty walking.  HPI: Erik Moss is a 85 y.o. male with medical history significant of BPH, type 2 diabetes, hyperlipidemia, hypertension, who is coming to the emergency department due to having difficulty walking as he felt that his left lower extremity was dragging.  However, by the time that the patient came to the emergency department his symptoms have mostly resolved.  He denies headache, blurred vision, nausea or vomiting.  He denies fever, chills, sore throat, wheezing, hemoptysis, chest pain, palpitations, dizziness, diaphoresis, PND, orthopnea or pitting edema of the lower extremities.  Denies abdominal pain, diarrhea, constipation, melena or hematochezia.  No dysuria, frequency or hematuria.  The patient has nocturia.  He denies polyuria, polydipsia, polyphagia or blurred vision.  ED Course: His urinalysis shows small hemoglobinuria and rare bacteria on micro exam.  UDS was negative.  CBC was normal.  Alcohol less than 10 mg/dL.  CMP showed a glucose of 109, BUN of 31, creatinine 1.92 mg/dL.  All other values are within normal limits.  Imaging: CT head showed mild chronic ischemic white matter disease but no intracranial normality was seen.  However MRI of head without contrast showed a small region of acute infarction on the right affecting the lateral thalamus/posterior limb internal capsule.  Please see images and full regular report for further detail.  Review of Systems: As per HPI otherwise 10 point review of systems negative.   Past Medical History:  Diagnosis Date  . BPH (benign prostatic hyperplasia) 10/08/2019  . Diabetes mellitus without complication ([place])   . Hyperlipidemia 10/08/2019  . Hypertension 10/08/2019  .  Type 2 diabetes mellitus ([place]) 10/08/2019    History reviewed. No pertinent surgical history.   reports that he has never smoked. He has never used smokeless tobacco. He reports that he does not drink alcohol or use drugs.  No Known Allergies  Family medical history Sister type 2 diabetes Maternal grandmother history of CVA.  Prior to Admission medications   Medication Sig Start Date End Date Taking? Authorizing Provider  amLODipine (NORVASC) 5 MG tablet Take 5 mg by mouth daily.   Yes [provider]  ferrous gluconate (FERGON) 324 MG tablet Take 648 mg by mouth daily.    Yes [provider]  glimepiride (AMARYL) 1 MG tablet Take 1 mg by mouth daily with breakfast.   Yes [provider]  Multiple Vitamin (MULTIVITAMIN WITH MINERALS) TABS tablet Take 1 tablet by mouth daily.   Yes [provider]  simvastatin (ZOCOR) 20 MG tablet Take 20 mg by mouth every evening.    Yes [provider]  sitaGLIPtin (JANUVIA) 25 MG tablet Take 25 mg by mouth daily.   Yes [provider]  sodium bicarbonate 650 MG tablet Take 650 mg by mouth 2 (two) times daily.   Yes [provider]  tamsulosin (FLOMAX) 0.4 MG CAPS capsule Take 0.4 mg by mouth at bedtime.    Yes [provider]  triamcinolone ointment (KENALOG) 0.1 % Apply 1 application topically daily as needed (itching on lower legs.).    Yes [provider]    Physical Exam: Vitals:   10/08/19 0759 10/08/19 1113 10/08/19 1348  BP: (!) 167/82 (!) 155/75 (!) 152/76  Pulse: 86 60 86  Resp: 18 18  18  Temp: 97.8 F (36.6 C) 98.2 F (36.8 C) 98.2 F (36.8 C)  TempSrc: Oral Oral Oral  SpO2: 100% 100% 98%    Constitutional: NAD, calm, comfortable Eyes: PERRL, lids and conjunctivae normal ENMT: Mucous membranes are moist. Posterior pharynx clear of any exudate or lesions. Neck: normal, supple, no masses, no thyromegaly Respiratory: clear to auscultation bilaterally,  no wheezing, no crackles. Normal respiratory effort. No accessory muscle use.  Cardiovascular: Regular rate and rhythm, no murmurs / rubs / gallops. No extremity edema. 2+ pedal pulses. No carotid bruits.  Abdomen: Soft, no tenderness, no masses palpated. No hepatosplenomegaly. Bowel sounds positive.  Musculoskeletal: no clubbing / cyanosis. Good ROM, no contractures. Normal muscle tone.  Skin: no rashes, lesions, ulcers. No induration Neurologic: CN 2-12 grossly intact. Sensation intact, DTR normal.  4/5 weakness some LLE.Marland Kitchen  Psychiatric: Normal judgment and insight. Alert and oriented x 3. Normal mood.   Labs on Admission: I have personally reviewed following labs and imaging studies  CBC: Recent Labs  Lab 10/08/19 0829  WBC 6.3  NEUTROABS 4.0  HGB 13.0  HCT 40.5  MCV 97.8  PLT [id number]   Basic Metabolic Panel: Recent Labs  Lab 10/08/19 0829  NA 139  K 4.1  CL 106  CO2 23  GLUCOSE 109*  BUN 31*  CREATININE 1.92*  CALCIUM 9.5   GFR: CrCl cannot be calculated (Unknown ideal weight.). Liver Function Tests: Recent Labs  Lab 10/08/19 0829  AST 18  ALT 16  ALKPHOS 60  BILITOT 0.7  PROT 7.5  ALBUMIN 4.3   No results for input(s): LIPASE, AMYLASE in the last 168 hours. No results for input(s): AMMONIA in the last 168 hours. Coagulation Profile: Recent Labs  Lab 10/08/19 0829  INR 0.9   Cardiac Enzymes: No results for input(s): CKTOTAL, CKMB, CKMBINDEX, TROPONINI in the last 168 hours. BNP (last 3 results) No results for input(s): PROBNP in the last 8760 hours. HbA1C: No results for input(s): HGBA1C in the last 72 hours. CBG: No results for input(s): GLUCAP in the last 168 hours. Lipid Profile: No results for input(s): CHOL, HDL, LDLCALC, TRIG, CHOLHDL, LDLDIRECT in the last 72 hours. Thyroid Function Tests: No results for input(s): TSH, T4TOTAL, FREET4, T3FREE, THYROIDAB in the last 72 hours. Anemia Panel: No results for input(s): VITAMINB12, FOLATE, FERRITIN,  TIBC, IRON, RETICCTPCT in the last 72 hours. Urine analysis:    Component Value Date/Time   COLORURINE YELLOW 10/08/2019 0830   APPEARANCEUR CLEAR 10/08/2019 0830   LABSPEC 1.013 10/08/2019 0830   PHURINE 5.0 10/08/2019 0830   GLUCOSEU NEGATIVE 10/08/2019 0830   HGBUR SMALL (A) 10/08/2019 0830   BILIRUBINUR NEGATIVE 10/08/2019 0830   KETONESUR NEGATIVE 10/08/2019 0830   PROTEINUR NEGATIVE 10/08/2019 0830   NITRITE NEGATIVE 10/08/2019 0830   LEUKOCYTESUR NEGATIVE 10/08/2019 0830    Radiological Exams on Admission: CT HEAD WO CONTRAST  Result Date: 10/08/2019 CLINICAL DATA:  Difficulty walking. EXAM: CT HEAD WITHOUT CONTRAST TECHNIQUE: Contiguous axial images were obtained from the base of the skull through the vertex without intravenous contrast. COMPARISON:  May 08, 2019. FINDINGS: Brain: Mild chronic ischemic white matter disease is noted. No mass effect or midline shift is noted. Ventricular size is within normal limits. There is no evidence of mass lesion, hemorrhage or acute infarction. Vascular: No hyperdense vessel or unexpected calcification. Skull: Normal. Negative for fracture or focal lesion. Sinuses/Orbits: No acute finding. Other: None. IMPRESSION: Mild chronic ischemic white matter disease. No acute intracranial abnormality seen. Electronically  Signed   By: Marijo Conception M.D.   On: 10/08/2019 10:48   MR BRAIN WO CONTRAST  Result Date: 10/08/2019 CLINICAL DATA:  Awoke today with weakness in the left leg. EXAM: MRI HEAD WITHOUT CONTRAST TECHNIQUE: Multiplanar, multiecho pulse sequences of the brain and surrounding structures were obtained without intravenous contrast. COMPARISON:  CT same day.  CT 05/08/2019. FINDINGS: Brain: Diffusion imaging shows a small region of restricted diffusion along the right lateral thalamus/posterior limb internal capsule. This probably explains the clinical presentation. No other acute or subacute infarction. Elsewhere, brainstem shows chronic  small-vessel ischemic changes of the pons. No focal cerebellar insult. Cerebral hemispheres show mild chronic small-vessel change of the white matter for a person of this age. No large vessel territory infarction. No mass lesion, hemorrhage, hydrocephalus or extra-axial collection. Vascular: Major vessels at the base of the brain show flow. Skull and upper cervical spine: Negative Sinuses/Orbits: Sinuses are clear except for opacification of a posterior ethmoid air cell on the right. Orbits are negative. Other: None IMPRESSION: Small region of acute infarction on the right affecting the lateral thalamus/posterior limb internal capsule. Age related chronic small-vessel ischemic changes elsewhere throughout the brain as above. Electronically Signed   By: Nelson Chimes M.D.   On: 10/08/2019 14:46    EKG: Independently reviewed.  Assessment/Plan Principal Problem:   Acute CVA (cerebrovascular accident) ([place]) Admit to telemetry/inpatient. Frequent neuro checks. PT/OT/SLP. Check fasting lipids. Check hemoglobin A1c. Check carotid Dopplers and echocardiogram. Neurology is following.  Active Problems:   Type 2 diabetes mellitus (HCC) Check hemoglobin A1c. Carbohydrate modified diet. CBG monitoring before meals and bedtime.    Hyperlipidemia Check fasting lipids. Continue statin.    BPH (benign prostatic hyperplasia) Continue Flomax.    Hypertension Allow permissive hypertension. Hold antihypertensives for now.   DVT prophylaxis: SCDs. Code Status: Full code. Family Communication: Disposition Plan: Admit for CVA close monitoring and work-up. Consults called: Neuro hospitalist team is aware and made recommendations. Admission status: Inpatient/telemetry.   Reubin Milan MD [facility]  If 7PM-7AM, please contact night-coverage [email or web address]  10/08/2019, 5:40 PM   This document was created using Dragon voice recognition software and may contain some unintended  transcription errors.

## 2019-10-08 NOTE — ED Notes (Signed)
Gave pt ham sandwich and diet ginger ale.

## 2019-10-08 NOTE — Consult Note (Signed)
Neurology Consultation  Reason for Consult: Stroke Referring Physician: Dr. Olevia Bowens, hospitalist  CC: Gait problem  History is obtained from: Patient, chart  HPI: Erik Moss is a 85 y.o. male past medical history of diabetes, hyperlipidemia, hypertension, last known well 8 PM last night on 10/07/2019 when he went to bed, woke up this morning with difficulty walking, especially moving the left leg.  Came into the hospital [facility] for emergent evaluation, was deemed to be outside the window for intervention and MRI brain pursued that revealed acute ischemic stroke. Neurology consulted for stroke. Patient reports that he moved here a few months ago from [place].  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. Daughter lives in [place] and during the start of the pandemic he moved to [place] to be closer to family. Has been diagnosed with chronic kidney disease, for which she follows with nephrology. No stroke symptoms in the past.  No history of strokes in the family. Symptoms started to resolve when he arrived to the emergency room.  LKW: 8 PM 10/07/2019 tpa given?: no, outside the window Premorbid modified Rankin scale (mRS): 0  ROS: Review of systems obtained and negative except as listed in the HPI  Past Medical History:  Diagnosis Date  . BPH (benign prostatic hyperplasia) 10/08/2019  . Diabetes mellitus without complication ([place])   . Hyperlipidemia 10/08/2019  . Hypertension 10/08/2019  . Type 2 diabetes mellitus ([place]) 10/08/2019    History reviewed. No pertinent family history. No family to strokes  Social History:   reports that he has never smoked. He has never used smokeless tobacco. He reports that he does not drink alcohol or use drugs.  Medications  Current Facility-Administered Medications:  .   stroke: mapping our early  stages of recovery book, , Does not apply, Once, Reubin Milan, MD .  acetaminophen (TYLENOL) tablet 650 mg, 650 mg, Oral, Q6H PRN **OR** acetaminophen (TYLENOL) suppository 650 mg, 650 mg, Rectal, Q6H PRN, Reubin Milan, MD .  Derrill Memo ON 10/09/2019] aspirin EC tablet 81 mg, 81 mg, Oral, Daily, Reubin Milan, MD .  Derrill Memo ON 10/09/2019] clopidogrel (PLAVIX) tablet 75 mg, 75 mg, Oral, Daily, Reubin Milan, MD .  Derrill Memo ON 10/09/2019] ferrous gluconate [facility]) tablet 648 mg, 648 mg, Oral, Daily, Reubin Milan, MD .  Derrill Memo ON 10/09/2019] glimepiride (AMARYL) tablet 1 mg, 1 mg, Oral, Q breakfast, Reubin Milan, MD .  Derrill Memo ON 10/09/2019] linagliptin (TRADJENTA) tablet 5 mg, 5 mg, Oral, Daily, Reubin Milan, MD .  simvastatin (ZOCOR) tablet 20 mg, 20 mg, Oral, QHS, Reubin Milan, MD, 20 mg at 10/08/19 2156 .  sodium bicarbonate tablet 650 mg, 650 mg, Oral, BID, Reubin Milan, MD, 650 mg at 10/08/19 2154 .  tamsulosin (FLOMAX) capsule 0.4 mg, 0.4 mg, Oral, QHS, Reubin Milan, MD, 0.4 mg at 10/08/19 2154   Exam: Current vital signs: BP (!) 150/63 (BP Location: Left Arm)   Pulse 60   Temp 98.3 F (36.8 C) (Oral)   Resp 16   Ht 5\' 6"  (1.676 m)   Wt 65.6 kg   SpO2 98%   BMI 23.34 kg/m  Vital signs in last 24 hours: Temp:  [97.8 F (36.6 C)-98.3 F (36.8 C)] 98.3 F (36.8 C) (12/15 2131) Pulse Rate:  [60-86] 60 (12/15 2131) Resp:  [13-18] 16 (12/15 2131) BP: (109-167)/(63-82)  150/63 (12/15 2131) SpO2:  [98 %-100 %] 98 % (12/15 2131) Weight:  [65.6 kg] 65.6 kg (12/15 2130) GENERAL: Awake, alert in NAD HEENT: - Normocephalic and atraumatic, dry mm, no LN++, no Thyromegally LUNGS - Clear to auscultation bilaterally with no wheezes CV - S1S2 RRR, no m/r/g, equal pulses bilaterally. ABDOMEN - Soft, nontender, nondistended with normoactive BS Ext: warm, well perfused, intact peripheral pulses, no edema  NEURO:  Mental Status:  AA&Ox3 Language: speech is clear.  Naming, repetition, fluency, and comprehension intact. Cranial Nerves: PERRL. EOMI, visual fields full, no facial asymmetry, facial sensation intact, hearing intact, tongue/uvula/soft palate midline, normal sternocleidomastoid and trapezius muscle strength. No evidence of tongue atrophy or fibrillations Motor: Symmetric 5/5 in both upper and lower extremities Tone: is normal and bulk is normal Sensation- Intact to light touch bilaterally Coordination: FTN intact bilaterally Gait- deferred  NIHSS-0   Labs I have reviewed labs in epic and the results pertinent to this consultation are:  CBC    Component Value Date/Time   WBC 6.3 10/08/2019 0829   RBC 4.14 (L) 10/08/2019 0829   HGB 13.0 10/08/2019 0829   HCT 40.5 10/08/2019 0829   PLT 270 10/08/2019 0829   MCV 97.8 10/08/2019 0829   MCH 31.4 10/08/2019 0829   MCHC 32.1 10/08/2019 0829   RDW 13.1 10/08/2019 0829   LYMPHSABS 1.8 10/08/2019 0829   MONOABS 0.4 10/08/2019 0829   EOSABS 0.0 10/08/2019 0829   BASOSABS 0.0 10/08/2019 0829    CMP     Component Value Date/Time   NA 139 10/08/2019 0829   K 4.1 10/08/2019 0829   CL 106 10/08/2019 0829   CO2 23 10/08/2019 0829   GLUCOSE 109 (H) 10/08/2019 0829   BUN 31 (H) 10/08/2019 0829   CREATININE 1.92 (H) 10/08/2019 0829   CALCIUM 9.5 10/08/2019 0829   PROT 7.5 10/08/2019 0829   ALBUMIN 4.3 10/08/2019 0829   AST 18 10/08/2019 0829   ALT 16 10/08/2019 0829   ALKPHOS 60 10/08/2019 0829   BILITOT 0.7 10/08/2019 0829   GFRNONAA 31 (L) 10/08/2019 0829   GFRAA 36 (L) 10/08/2019 0829   Imaging I have reviewed the images obtained:  CT-scan of the brain-no acute abnormality.  No bleed.  MRI examination of the brain-small area of restricted diffusion in the right lateral thalamus/posterior limb of internal capsule.  Age-related chronic small vessel disease.   Assessment: Erik Moss is a very pleasant 85 year old man with above past medical  history who presented to the emergency room for evaluation of difficulty walking due to left leg weakness.  Symptoms are resolved by the time he arrived at the emergency room but an MRI was done due to risk factors that revealed a small area of restricted diffusion in the right lateral thalamus/posterior limb of internal capsule-likely representing a small vessel etiology acute ischemic infarct.  Impression: Acute ischemic stroke-likely small vessel etiology  Recommendations: -Telemetry monitoring -Allow for permissive hypertension for the first 24-48h - only treat PRN if SBP >220 mmHg. Blood pressures can be gradually normalized to SBP<140 upon discharge. -MRA head without contrast, MRA neck without contrast -Echocardiogram -HgbA1c, fasting lipid panel -Frequent neuro checks -Prophylactic therapy-Antiplatelet med: Aspirin - dose 325mg  PO or 300mg  PR -Atorvastatin 80 mg PO daily -Risk factor modification -PT consult, OT consult, Speech consult  Please page stroke NP/PA/MD (listed on AMION)  from 8am-4 pm as this patient will be followed by the stroke team at this point.  -- Amie Portland, MD [facility] Pager: [phone] If 7pm to 7am, please call on call as listed on AMION.

## 2019-10-09 ENCOUNTER — Other Ambulatory Visit (HOSPITAL_COMMUNITY): Payer: Medicare HMO

## 2019-10-09 ENCOUNTER — Inpatient Hospital Stay (HOSPITAL_COMMUNITY): Payer: Medicare HMO

## 2019-10-09 DIAGNOSIS — I6389 Other cerebral infarction: Secondary | ICD-10-CM

## 2019-10-09 DIAGNOSIS — I10 Essential (primary) hypertension: Secondary | ICD-10-CM

## 2019-10-09 LAB — ECHOCARDIOGRAM COMPLETE
Height: 66 in
Weight: 2313.95 oz

## 2019-10-09 LAB — GLUCOSE, CAPILLARY
Glucose-Capillary: 82 mg/dL (ref 70–99)
Glucose-Capillary: 152 mg/dL — ABNORMAL HIGH (ref 70–99)
Glucose-Capillary: 154 mg/dL — ABNORMAL HIGH (ref 70–99)
Glucose-Capillary: 123 mg/dL — ABNORMAL HIGH (ref 70–99)

## 2019-10-09 IMAGING — MR MR MRA NECK W/O CM
1 of 2 series · 40 of 48 positions shown · non-contrast
Comparison: None.

CLINICAL DATA: Stroke

EXAM:
MRA HEAD WITHOUT CONTRAST
MRA NECK WITHOUT CONTRAST
TECHNIQUE: Angiographic images of the Circle of Willis were obtained using MRA
technique without intravenous contrast. Angiographic images of the
neck were obtained using MRA technique without intravenous contrast.
Carotid stenosis measurements (when applicable) are obtained
utilizing NASCET criteria, using the distal internal carotid
diameter as the denominator.

[Series 6: tof_fl3d_tra_iso · axial · 0.6mm · 0.52mm/px · z∈[-263,-140]mm · 40 of 226 slices shown]
[im 1/226]
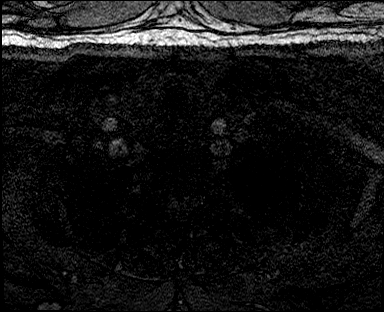
[im 6/226]
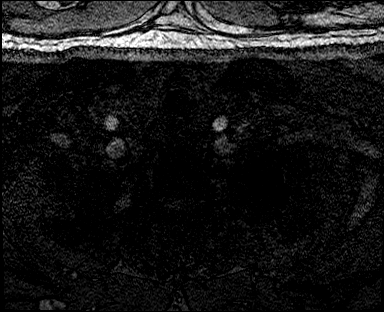
[im 11/226]
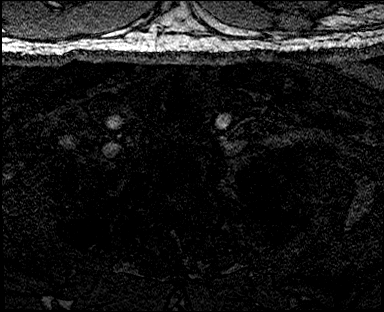
[im 17/226]
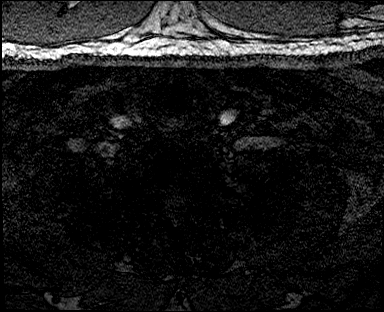
[im 22/226]
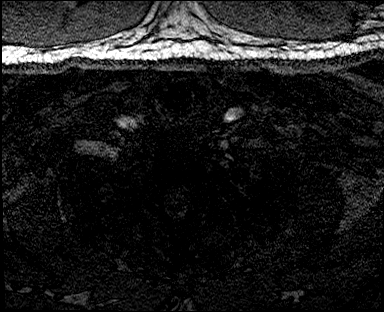
[im 27/226]
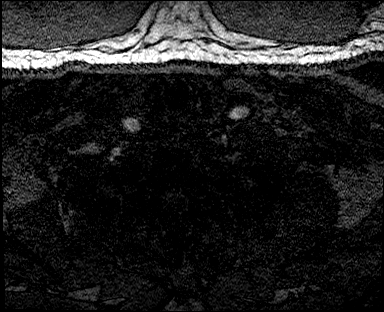
[im 33/226]
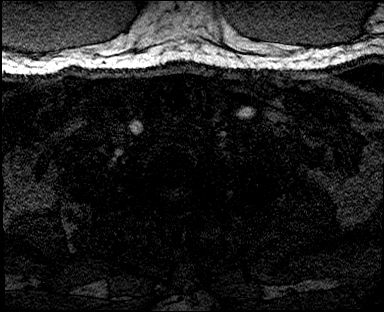
[im 38/226]
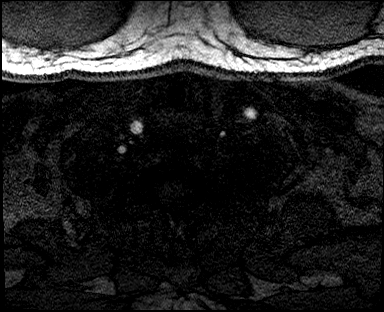
[im 43/226]
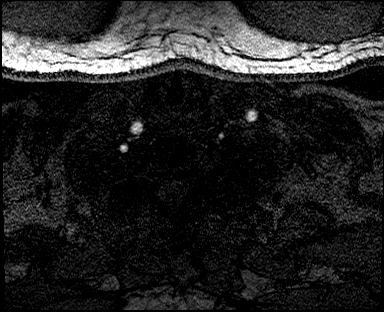
[im 49/226]
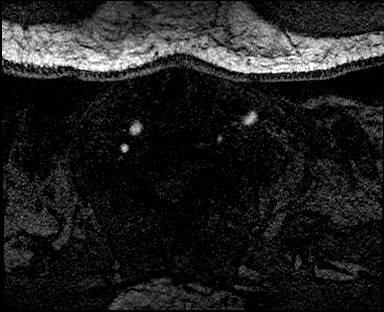
[im 54/226]
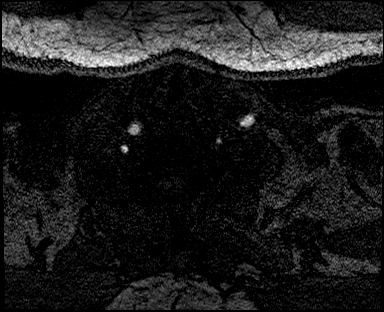
[im 59/226]
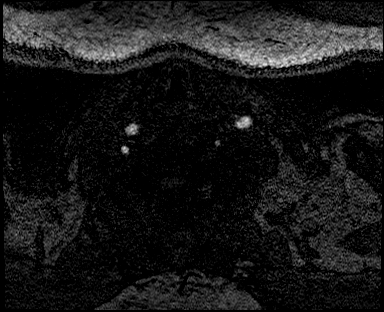
[im 65/226]
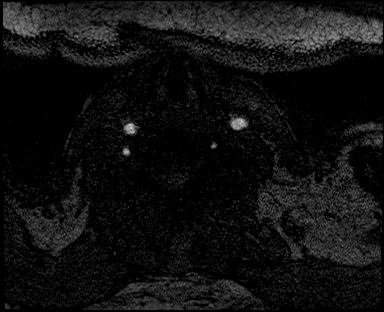
[im 70/226]
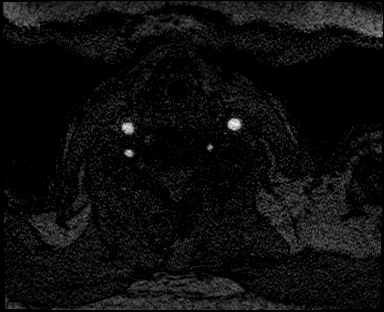
[im 76/226]
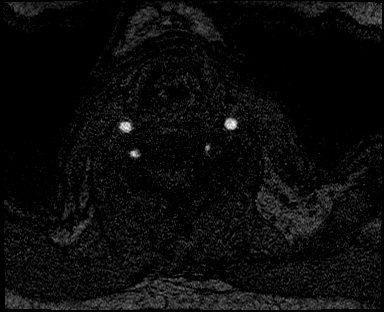
[im 81/226]
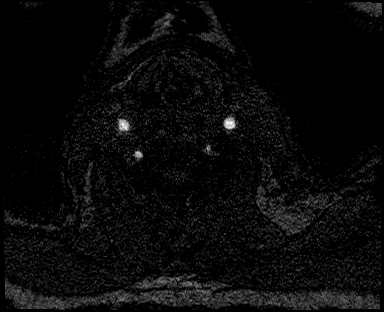
[im 86/226]
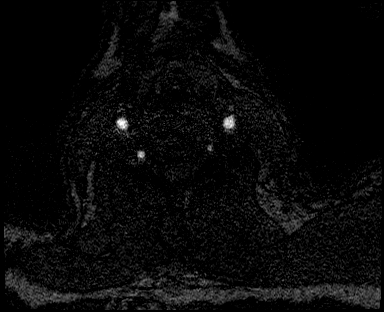
[im 92/226]
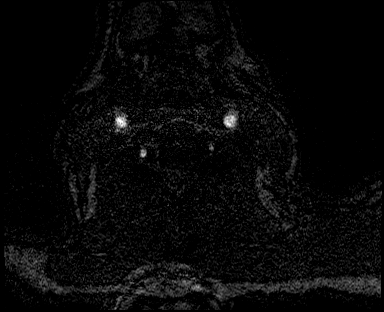
[im 97/226]
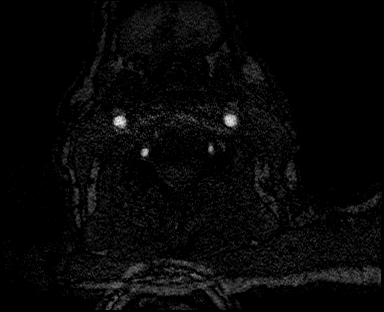
[im 102/226]
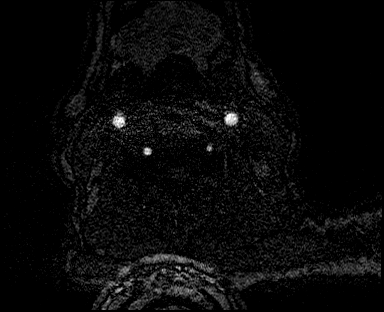
[im 108/226]
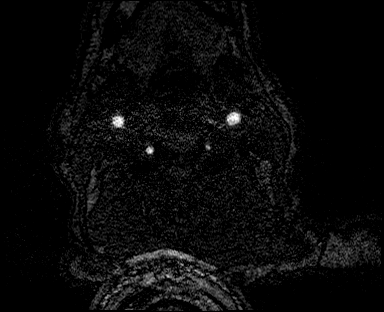
[im 113/226]
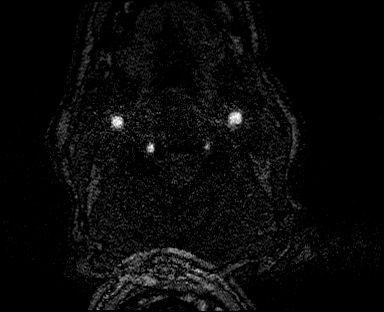
[im 118/226]
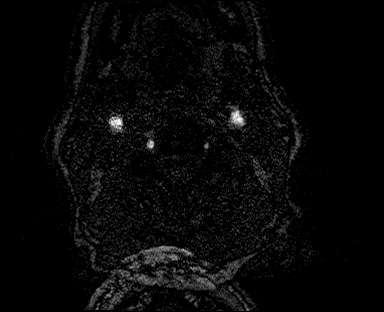
[im 124/226]
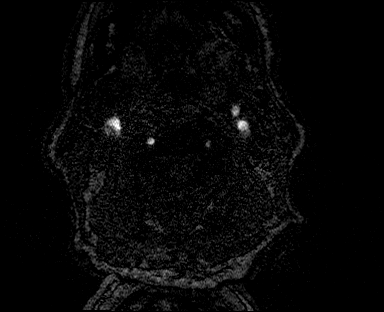
[im 129/226]
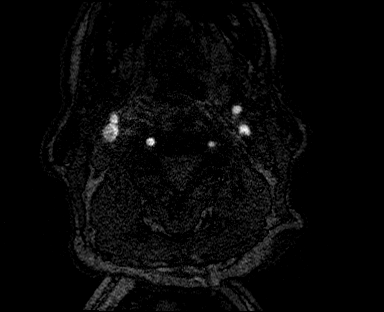
[im 134/226]
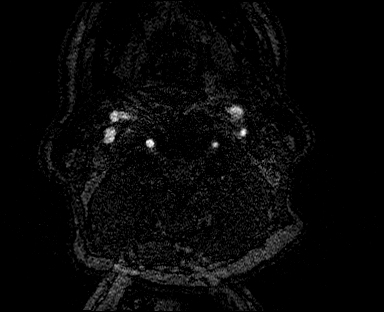
[im 140/226]
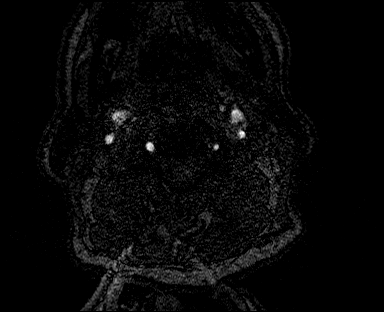
[im 145/226]
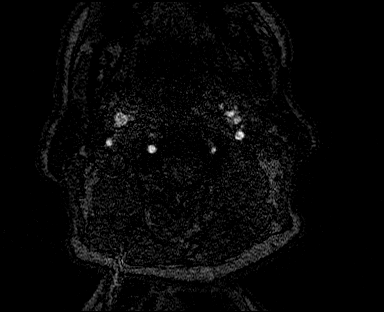
[im 151/226]
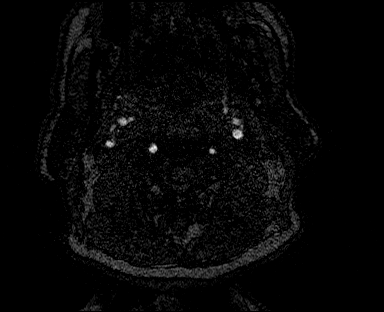
[im 156/226]
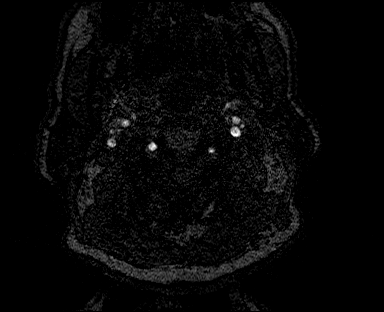
[im 161/226]
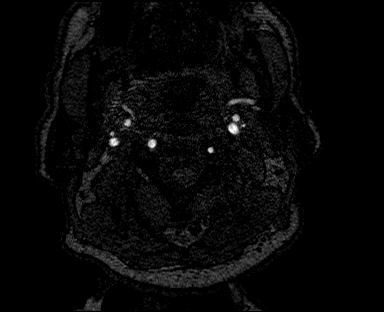
[im 167/226]
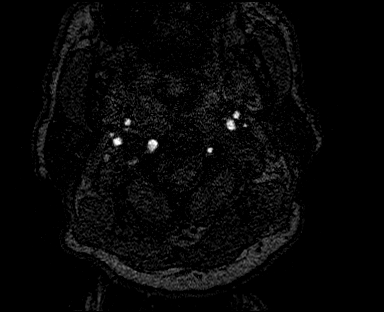
[im 172/226]
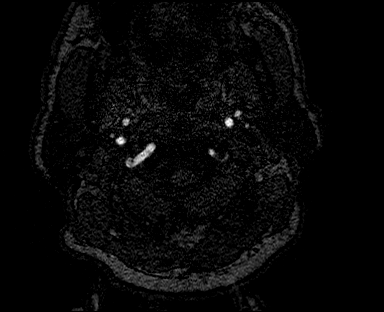
[im 177/226]
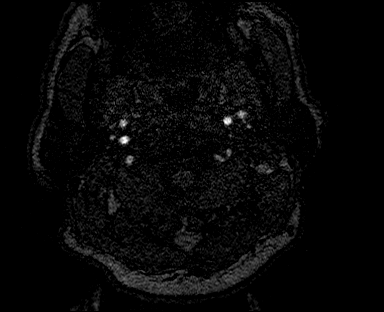
[im 183/226]
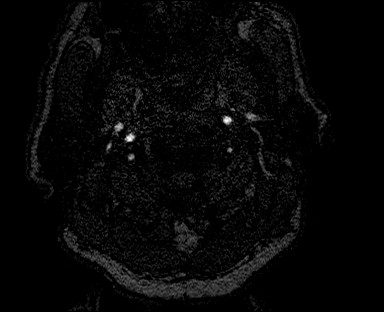
[im 188/226]
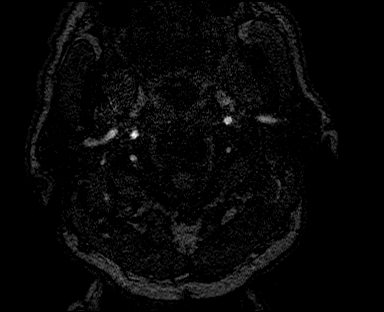
[im 193/226]
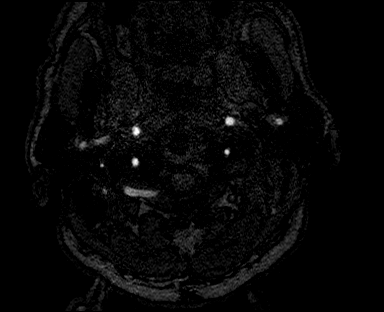
[im 199/226]
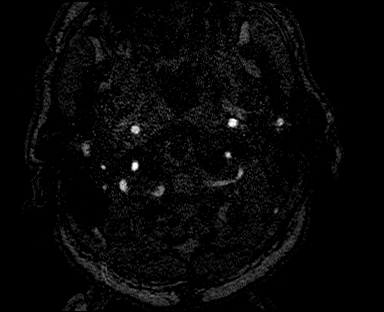
[im 204/226]
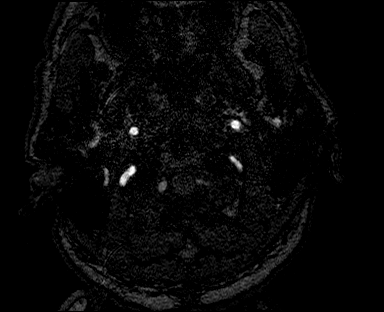
[im 215/226]
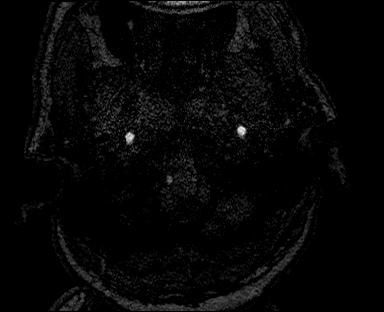

[40 of 48 positions shown; findings below may reference images not displayed]

FINDINGS: MRA HEAD FINDINGS

Intracranial internal carotid arteries are patent with
atherosclerotic irregularity and calcified plaque causing mild
stenosis. Middle and anterior cerebral arteries are patent.
Intracranial vertebral arteries, basilar artery, posterior cerebral
arteries are patent. There is no significant stenosis or aneurysm.

MRA NECK FINDINGS

The included common carotid arteries, internal carotid arteries, and
external carotid arteries are patent. There is mild atherosclerotic
irregularity at the ICA origins without hemodynamically significant
stenosis.

The included extracranial vertebral arteries are patent. The right
vertebral artery is dominant.
IMPRESSION: No large vessel occlusion or significant stenosis.

## 2019-10-09 IMAGING — MR MR MRA HEAD W/O CM
1 series · 19 of 48 positions shown · non-contrast
Comparison: None.

CLINICAL DATA: Stroke

EXAM:
MRA HEAD WITHOUT CONTRAST
MRA NECK WITHOUT CONTRAST
TECHNIQUE: Angiographic images of the Circle of Willis were obtained using MRA
technique without intravenous contrast. Angiographic images of the
neck were obtained using MRA technique without intravenous contrast.
Carotid stenosis measurements (when applicable) are obtained
utilizing NASCET criteria, using the distal internal carotid
diameter as the denominator.

[Series 5: 3d cow · axial · 0.5mm · 0.41mm/px · z∈[-170,-73]mm · 19 of 208 slices shown]
[im 1/208]
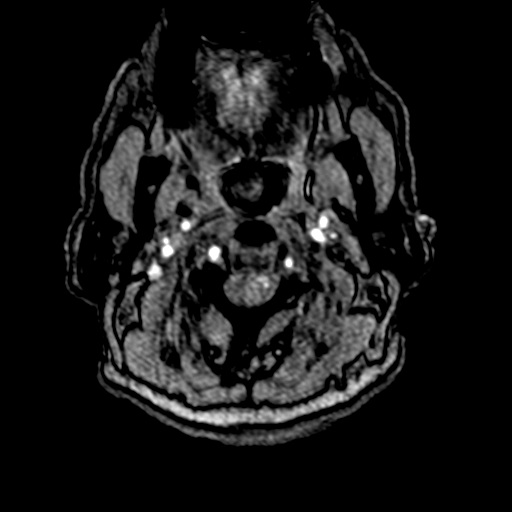
[im 5/208]
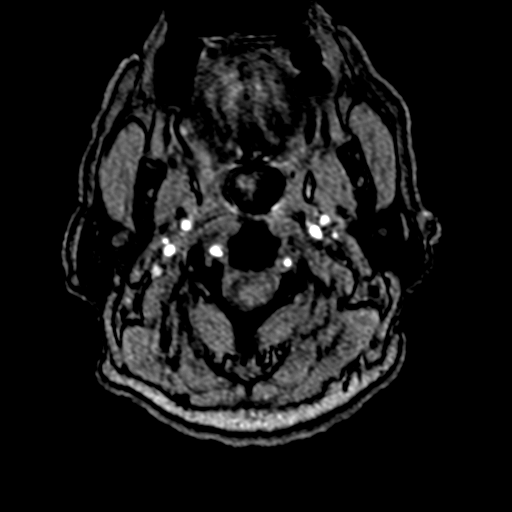
[im 9/208]
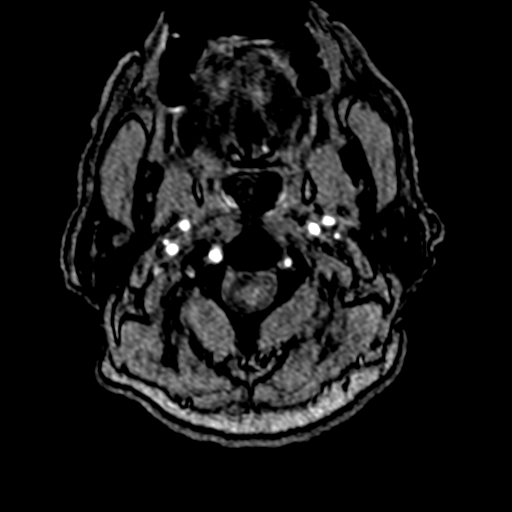
[im 14/208]
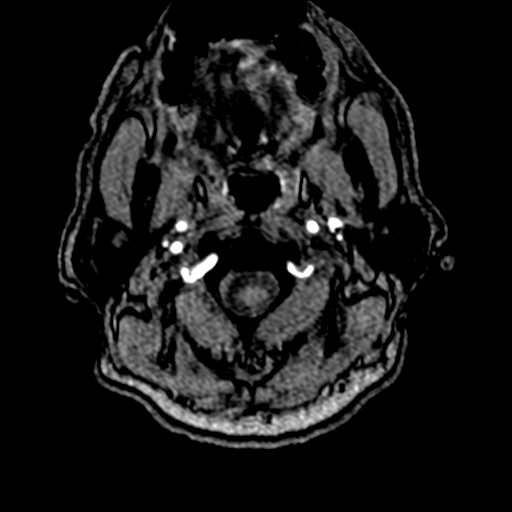
[im 18/208]
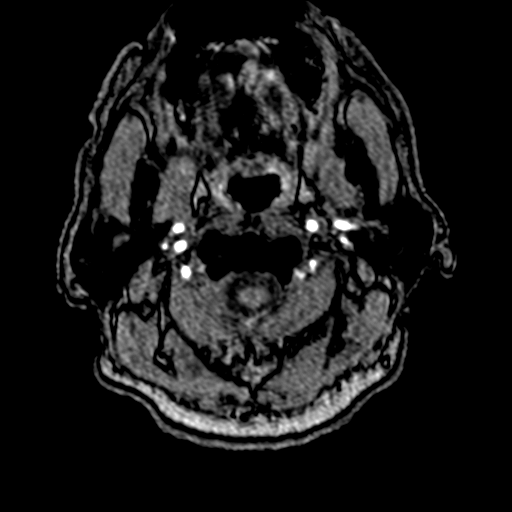
[im 23/208]
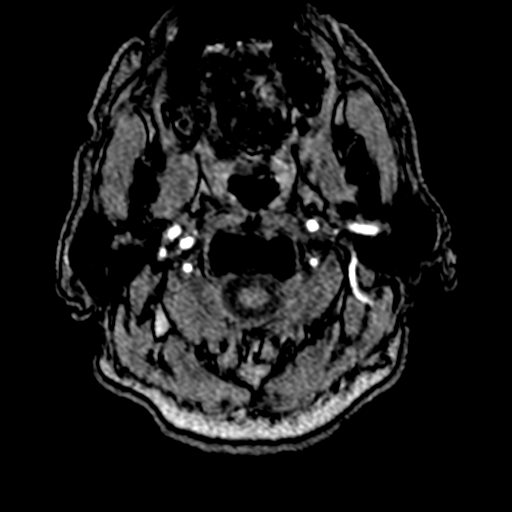
[im 27/208]
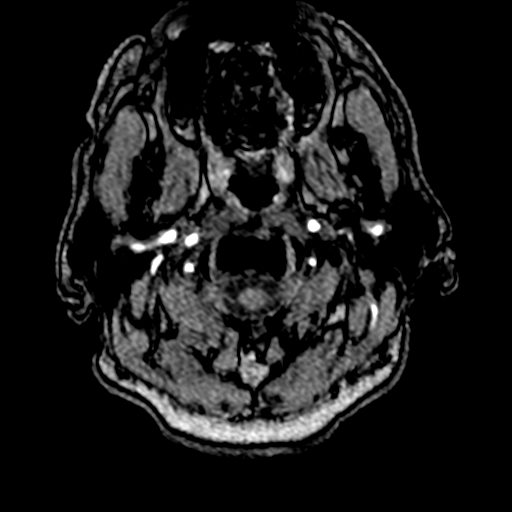
[im 31/208]
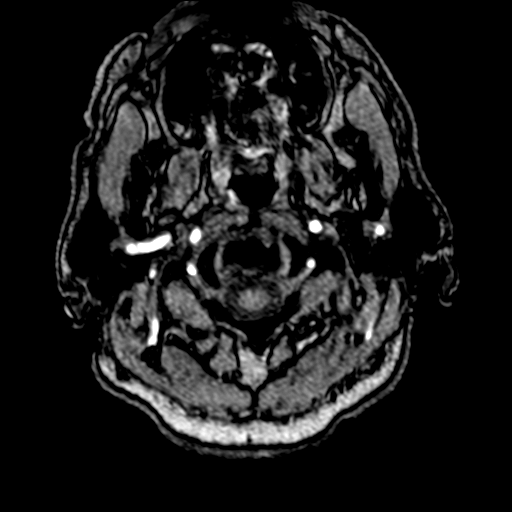
[im 36/208]
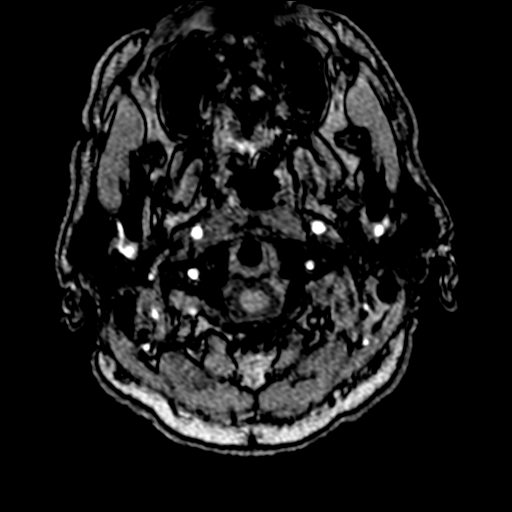
[im 40/208]
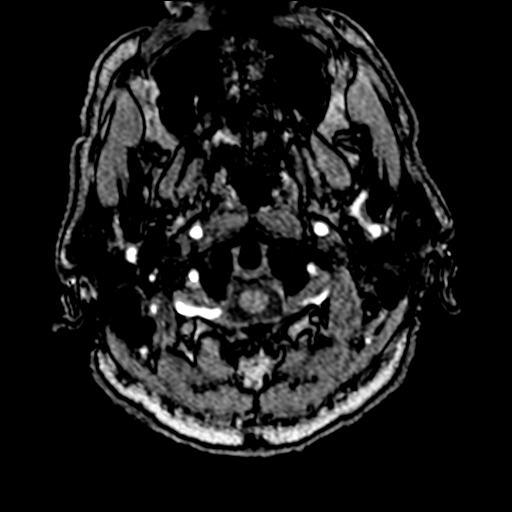
[im 45/208]
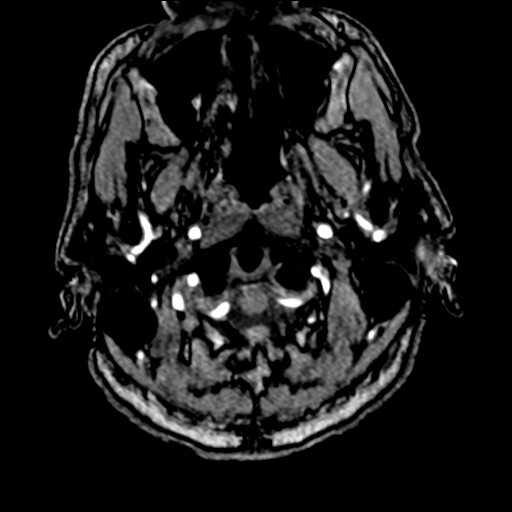
[im 67/208]
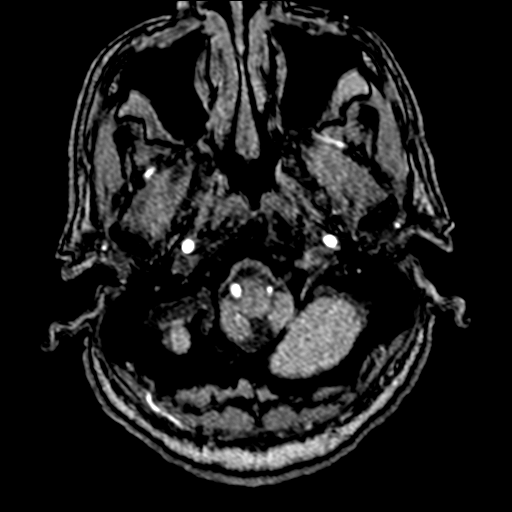
[im 93/208]
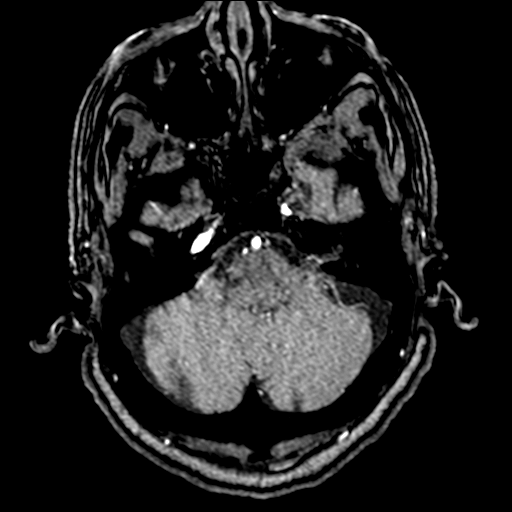
[im 106/208]
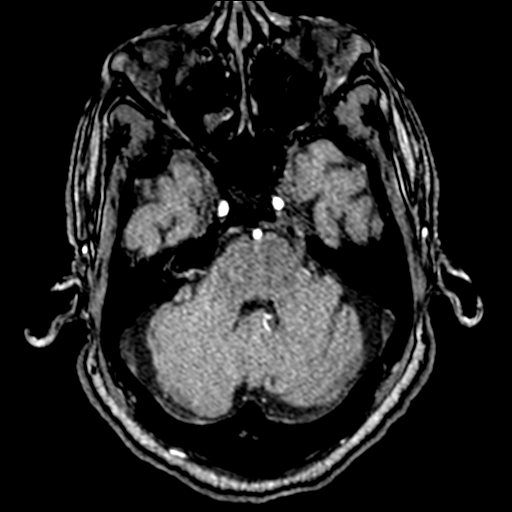
[im 119/208]
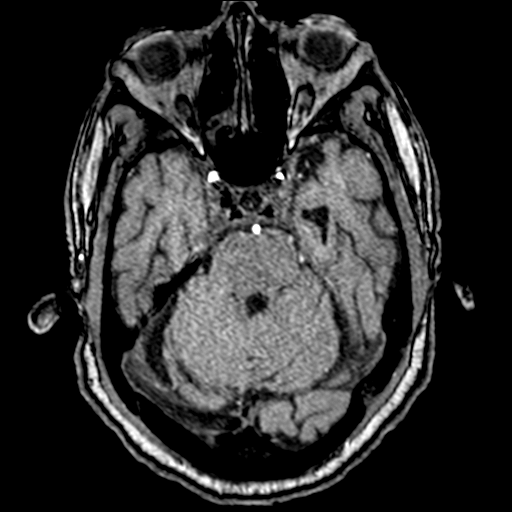
[im 146/208]
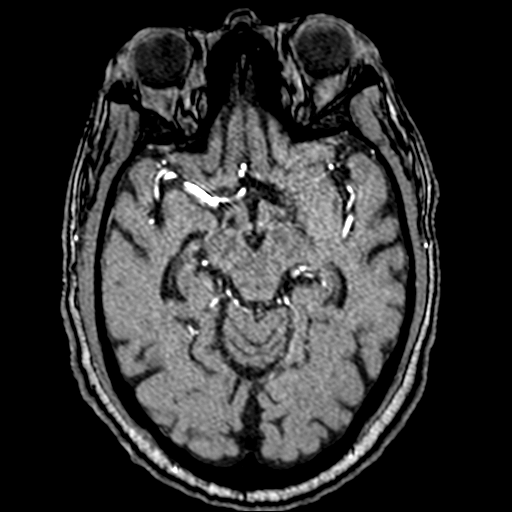
[im 172/208]
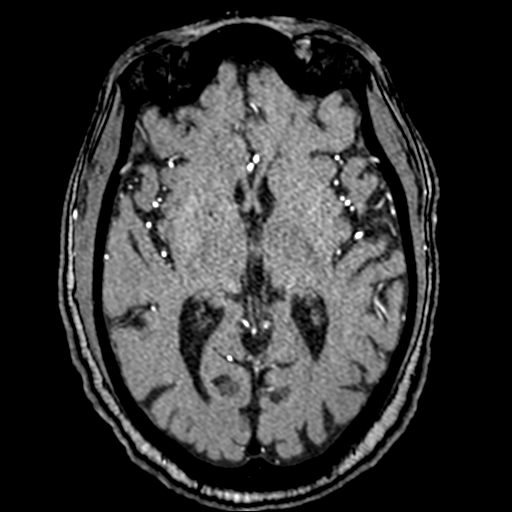
[im 177/208]
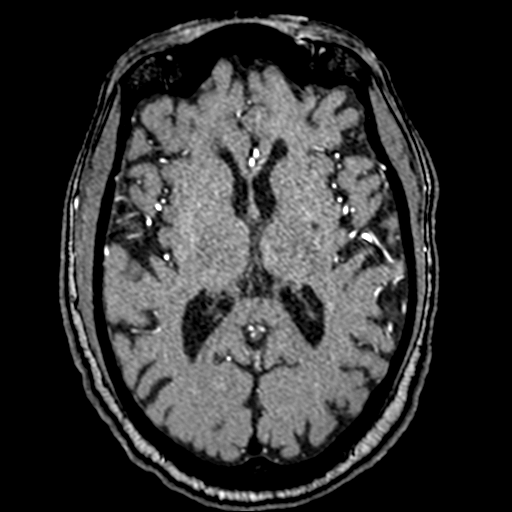
[im 199/208]
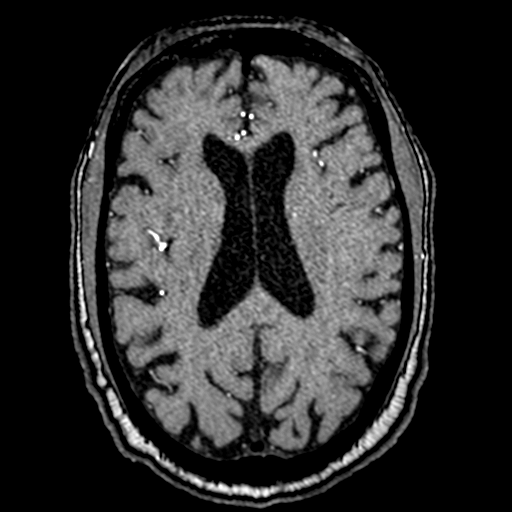

[19 of 48 positions shown; findings below may reference images not displayed]

FINDINGS: MRA HEAD FINDINGS

Intracranial internal carotid arteries are patent with
atherosclerotic irregularity and calcified plaque causing mild
stenosis. Middle and anterior cerebral arteries are patent.
Intracranial vertebral arteries, basilar artery, posterior cerebral
arteries are patent. There is no significant stenosis or aneurysm.

MRA NECK FINDINGS

The included common carotid arteries, internal carotid arteries, and
external carotid arteries are patent. There is mild atherosclerotic
irregularity at the ICA origins without hemodynamically significant
stenosis.

The included extracranial vertebral arteries are patent. The right
vertebral artery is dominant.
IMPRESSION: No large vessel occlusion or significant stenosis.

## 2019-10-09 MED ORDER — ATORVASTATIN CALCIUM 80 MG PO TABS
80.0000 mg | ORAL_TABLET | Freq: Every day | ORAL | Status: DC
  Administered 2019-10-09: 80 mg via ORAL
  Filled 2019-10-09: qty 1

## 2019-10-09 NOTE — Evaluation (Signed)
Occupational Therapy Evaluation Patient Details Name: Erik Moss MRN: [id number] DOB: 07-17-1933 Today's Date: 10/09/2019    History of Present Illness  Erik Moss is a 85 y.o. male past medical history of diabetes, CKD, hyperlipidemia, hypertension, who presents with difficulty walking, especially moving the left leg. MRI examination of the brain-small area of restricted diffusion in the right lateral thalamus/posterior limb of internal capsule.   Clinical Impression   Pt admitted with the above diagnoses and presents with below problem list. Pt will benefit from continued acute OT to address the below listed deficits and maximize independence with basic ADLs prior to d/c home. PTA pt was independent with ADLs. Lives with his daughter who works from home and his 41 yo granddaughter. Pt presents with impairments in balance and cognition. Increased risk of falls. Currently pt is supervision with UB ADLs and min guard to min A with LB ADLs and functional transfers, min guard with mobility utilizing a rw this session though pt did need some cueing on positioning of rw and sequencing movements. Pt with noticeable STM deficits and limited carryover of fall education making safety a concern. Decreased insight into implications of his balance and cognitive deficits. Optimistic that pt can progress to a point he will be able to d/c directly home with daughter. May need to consider CIR consult depending on pt's progress while inpatient.      Follow Up Recommendations  Outpatient OT;Supervision/Assistance - 24 hour;Other (comment)(neuro )    Equipment Recommendations  3 in 1 bedside commode;Other (comment)(to use as shower seat)    Recommendations for Other Services       Precautions / Restrictions Precautions Precautions: Fall Restrictions Weight Bearing Restrictions: No      Mobility Bed Mobility Overal bed mobility: Modified Independent                Transfers Overall  transfer level: Needs assistance Equipment used: Rolling walker (2 wheeled) Transfers: Sit to/from Stand Sit to Stand: Min guard;Min assist         General transfer comment: cues for sequencing movements with rw, mostly min guard but min A was needed at end of session to steady as pt sitting a bit prematurely into recliner, before positioned in front of recliner.     Balance Overall balance assessment: Needs assistance Sitting-balance support: Feet supported Sitting balance-Leahy Scale: Good     Standing balance support: No upper extremity supported;During functional activity Standing balance-Leahy Scale: Fair Standing balance comment: single extremity support and min guard to stand and brush teeth at the sink                           ADL either performed or assessed with clinical judgement   ADL Overall ADL's : Needs assistance/impaired Eating/Feeding: Set up;Sitting   Grooming: Min guard;Oral care;Standing   Upper Body Bathing: Set up;Sitting;Supervision/ safety;Min guard   Lower Body Bathing: Min guard;Minimal assistance;Sit to/from stand   Upper Body Dressing : Set up;Supervision/safety;Min guard;Sitting   Lower Body Dressing: Min guard;Minimal assistance;Sit to/from stand   Toilet Transfer: Min guard;Minimal assistance;Ambulation;RW;Comfort height toilet;Grab bars   Toileting- Clothing Manipulation and Hygiene: Min guard;Minimal assistance;Sit to/from stand   Tub/ Shower Transfer: Min guard;Minimal assistance;Ambulation;3 in 1;Rolling walker   Functional mobility during ADLs: Min guard;Minimal assistance;Rolling walker General ADL Comments: Pt with cognitive and balance deficits noted. Cues for sequecing transfers with rw. Inititiating sitting into recliner prematurely, before positioned in front of chair.  Vision         Perception     Praxis      Pertinent Vitals/Pain Pain Assessment: No/denies pain     Hand Dominance      Extremity/Trunk Assessment Upper Extremity Assessment Upper Extremity Assessment: Generalized weakness   Lower Extremity Assessment Lower Extremity Assessment: Defer to PT evaluation       Communication Communication Communication: No difficulties   Cognition Arousal/Alertness: Awake/alert Behavior During Therapy: Impulsive Overall Cognitive Status: Impaired/Different from baseline Area of Impairment: Attention;Memory;Following commands;Safety/judgement;Awareness;Problem solving                   Current Attention Level: Selective;Sustained Memory: Decreased recall of precautions;Decreased short-term memory Following Commands: Follows multi-step commands inconsistently Safety/Judgement: Decreased awareness of safety;Decreased awareness of deficits Awareness: Emergent Problem Solving: Slow processing General Comments: Verbal perseveration noted. STM deficits noted. Pt stating that he felt he could get up and walk by himself "just to go to the sink." Several reminders needed to not get up by himself and to use the call button to ask for help if he needs to get up. Limited carryover. Chair alarm set, spoke with nurse as well. Decreased insight into deficits + impaired balance.  Pt informed that he is at a higher risk for fall.    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; enjoys walking         OT Problem List: Decreased strength;Decreased activity tolerance;Impaired balance (sitting and/or standing);Decreased cognition;Decreased safety awareness;Decreased knowledge of use of DME or AE;Decreased knowledge of precautions      OT Treatment/Interventions: Self-care/ADL training;Therapeutic exercise;Energy conservation;DME and/or AE instruction;Therapeutic activities;Cognitive remediation/compensation;Patient/family education;Balance training    OT Goals(Current goals can be found in the care plan section) Acute Rehab OT Goals Patient Stated Goal: "walk more." "finish classes" OT Goal Formulation: With patient Time For Goal Achievement: 10/23/19 Potential to Achieve Goals: Good ADL Goals Pt Will Perform Grooming: with supervision;with set-up;sitting;standing Pt Will Perform Upper Body Bathing: with set-up;sitting Pt Will Perform Lower Body Bathing: with supervision;sit to/from stand Pt Will Transfer to Toilet: with supervision;ambulating Pt Will Perform Toileting - Clothing Manipulation and hygiene: with supervision;sit to/from stand Pt Will Perform Tub/Shower Transfer: with supervision;ambulating;3 in 1;rolling walker  OT Frequency: Min 2X/week   Barriers to D/C:            Co-evaluation              AM-PAC OT "6 Clicks" Daily Activity     Outcome Measure Help from another person eating meals?: None Help from another person taking care of personal grooming?: A Little Help from another person toileting, which includes using toliet, bedpan, or urinal?: A Little Help from another person bathing (including washing, rinsing, drying)?: A Little Help from another person to put on and taking off regular upper body clothing?: None Help from another person to put on and  taking off regular lower body clothing?: A Little 6 Click Score: 20   End of Session Equipment Utilized During Treatment: Gait belt;Rolling walker Nurse Communication: Mobility status;Precautions;Other  (comment)(Pt with limited carryover of ed on not getting up by himself)  Activity Tolerance: Patient tolerated treatment well Patient left: in chair;with call bell/phone within reach;with chair alarm set(door left open for safety)  OT Visit Diagnosis: Unsteadiness on feet (R26.81);Muscle weakness (generalized) (M62.81);Other abnormalities of gait and mobility (R26.89);Other symptoms and signs involving cognitive function                Time: 1131-1158 OT Time Calculation (min): 27 min Charges:  OT General Charges $OT Visit: 1 Visit OT Evaluation $OT Eval Moderate Complexity: 1 Mod OT Treatments $Self Care/Home Management : 8-22 mins  Tyrone Schimke, OT Acute Rehabilitation Services Pager: [phone] Office: [phone]   Hortencia Pilar 10/09/2019, 12:43 PM

## 2019-10-09 NOTE — H&P (View-Only) (Signed)
PROGRESS NOTE    Erik Moss  [id number] DOB: Jan 05, 1933 DOA: 10/08/2019 PCP: [name], FNP    Brief Narrative:   Erik Moss is a 85 y.o. male with medical history significant of BPH, type 2 diabetes, hyperlipidemia, hypertension, who is coming to the emergency department due to having difficulty walking as he felt that his left lower extremity was dragging.  However, by the time that the patient came to the emergency department his symptoms have mostly resolved.  He denies headache, blurred vision, nausea or vomiting.  He denies fever, chills, sore throat, wheezing, hemoptysis, chest pain, palpitations, dizziness, diaphoresis, PND, orthopnea or pitting edema of the lower extremities.  Denies abdominal pain, diarrhea, constipation, melena or hematochezia.  No dysuria, frequency or hematuria.  The patient has nocturia.  He denies polyuria, polydipsia, polyphagia or blurred vision.  ED Course: His urinalysis shows small hemoglobinuria and rare bacteria on micro exam.  UDS was negative.  CBC was normal.  Alcohol less than 10 mg/dL.  CMP showed a glucose of 109, BUN of 31, creatinine 1.92 mg/dL.  All other values are within normal limits.  Imaging: CT head showed mild chronic ischemic white matter disease but no intracranial normality was seen.  However MRI of head without contrast showed a small region of acute infarction on the right affecting the lateral thalamus/posterior limb internal capsule.  Please see images and full regular report for further detail.   Assessment & Plan:   Principal Problem:   Acute CVA (cerebrovascular accident) [facility]) Active Problems:   Type 2 diabetes mellitus ([place])   Hyperlipidemia   BPH (benign prostatic hyperplasia)   Hypertension   Acute ischemic stroke Patient presenting the ED with left lower extremity weakness.  CT head unrevealing other than mild chronic ischemic white matter disease.  MRI head without contrast small acute  infarction right lateral thalamus/posterior limb internal capsule. 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  Right lower extremity weakness markedly improved since initial presentation.  PT recommends outpatient PT. --Neurology following, appreciate assistance --MRA head/neck without contrast: Pending --TEE pending for further evaluation of aortic valve --Pending OT/speech therapy evaluation --Aspirin 81 mg p.o. daily --Plavix 75 mg p.o. daily --Atorvastatin 80 mg p.o. 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 --Blood cultures x2, ordered --Consulted cardiology for TEE; likely plan for tomorrow  Type 2 diabetes mellitus Home regimen includes glimepiride 1 mg p.o. daily, Januvia 25 mg p.o. daily. --Check hemoglobin A1c --Continue glimepiride 1 mg p.o. daily, linagliptin 5 mg p.o. daily --Monitor CBGs before every meal at bedtime  Hyperlipidemia Was previously on simvastatin 20 mg p.o. daily. --Check lipid profile --changed to high intensity atorvastatin 80 mg p.o. daily  Essential hypertension BP 133/82, fairly well controlled.  On amlodipine 5mg  PO daily at home. --We will continue to hold home amlodipine to allow for permissive hypertension  BPH --Continue tamsulosin 0.4 mg p.o. nightly --Strict I's and O's --Monitor renal function daily  CKD stage IV Patient with previous hospitalization July 2020 with fatigue and was found to have renal failure with creatinine up to 3.02 and hyperkalemia with potassium of 6.1.  Recently moved from [place] to live with daughter.  Received Lokelma, bicarb, D50/insulin with improvement of his hyperkalemia during that admission.  Renal ultrasound with no  hydronephrosis with  increased echotexture consistent with medical renal disease; and bilateral renal cysts with one with septations.  Creatinine improved to 1.95 at time of discharge.  Creatinine on presentation this admission 1.92, likely his new baseline. --Continue to monitor renal function daily --Sodium bicarbonate 650 mg p.o. twice daily --Avoid nephrotoxins, renally dose all medications --Outpatient follow-up with nephrology   DVT prophylaxis: Heparin Code Status: Full code Family Communication: None present at bedside Disposition Plan: Continue inpatient, will need TEE, scheduled likely for tomorrow, further depending on clinical course and specialist evaluation.  Likely discharge home when medically ready with outpatient PT follow-up.   Consultants:   Neurology  Cardiology for TEE  Procedures:   Transthoracic echocardiogram: IMPRESSIONS    1. There is a small mass (0.6 cm x 0.6 cm) that appears to be attached to the [place] of the AoV. In certain views it appears to be focal calcification, but there does appear to be a mobile component to it in the basal SAX views. This could represent a  papillary fibroelastoma vs vegetation. Would recommend a TEE for better characterization, especially in this patient with concerns for stroke.  2. Left ventricular ejection fraction, by visual estimation, is 65 to 70%. The left ventricle has normal function. There is no left ventricular hypertrophy.  3. Global right ventricle has normal systolic function.The right ventricular size is normal. No increase in right ventricular wall thickness.  4. Presence of pericardial fat pad.  5. Small pericardial effusion.  6. The pericardial effusion is circumferential.  7. Mild mitral annular calcification.  8. The mitral valve is grossly normal. Trivial mitral valve regurgitation.  9. The tricuspid valve is grossly normal. Tricuspid valve regurgitation is trivial. 10. The aortic valve is tricuspid.  Aortic valve regurgitation is not visualized. No evidence of aortic valve sclerosis or stenosis. 11. Normal pulmonary artery systolic pressure. 12. The inferior vena cava is normal in size with greater than 50% respiratory variability, suggesting right atrial pressure of 3 mmHg. 13. The pulmonic valve was grossly normal. Pulmonic valve regurgitation is trivial. 14. The left ventricle has no regional wall motion abnormalities. 15. Indeterminate diastolic filling due to E-A fusion. 16. Left atrial size was normal. 17. Right atrial size was normal. 18. The tricuspid regurgitant velocity is 2.17 m/s, and with an assumed right atrial pressure of 3 mmHg, the estimated right ventricular systolic pressure is normal at 21.8 mmHg.   Antimicrobials:   none   Subjective: Patient seen and examined bedside, resting comfortably.  Reports that his left lower extremity strength has improved greatly since initial presentation, but continues with some mild weakness; especially with ambulation.  Reports that he is on his 10th masters degree, but feels that he did poorly with his recent final exams.  No other complaints or concerns at this time.  Received notification from cardiology that there is a possible aortic valve mass concerning for vegetation versus fibroblastoma and recommend TEE.  Patient without any other complaints or concerns at this time.  Denies headache, no chest pain, palpitations, no fever/chills/night sweats, no nausea/vomiting/diarrhea, no shortness of breath, no abdominal pain, no fatigue, no paresthesias.  No acute events overnight per nursing staff.  Objective: Vitals:   10/09/19 0437 10/09/19 0700 10/09/19 0756 10/09/19 1135  BP: (!) 142/80 124/68 (!) 145/99 119/63  Pulse: 77  89 66  Resp: 20 18  19   Temp: 98.6 F (37 C) 98.7 F (37.1 C) 97.6 F (36.4 C)   TempSrc: Oral Oral Oral   SpO2: 100% 98%  98% 98%  Weight:      Height:        Intake/Output Summary (Last 24 hours) at  10/09/2019 1145 Last data filed at 10/09/2019 0744 Gross per 24 hour  Intake --  Output 200 ml  Net -200 ml   Filed Weights   10/08/19 2130  Weight: 65.6 kg    Examination:  General exam: Appears calm and comfortable  Respiratory system: Clear to auscultation. Respiratory effort normal. Cardiovascular system: S1 & S2 heard, RRR. No JVD, murmurs, rubs, gallops or clicks. No pedal edema. Gastrointestinal system: Abdomen is nondistended, soft and nontender. No organomegaly or masses felt. Normal bowel sounds heard. Central nervous system: Alert and oriented. No focal neurological deficits. Extremities: Moves all extremities independently, very slight asymmetric weakness left lower extremity. Skin: No rashes, lesions or ulcers Psychiatry: Judgement and insight appear normal. Mood & affect appropriate.     Data Reviewed: I have personally reviewed following labs and imaging studies  CBC: Recent Labs  Lab 10/08/19 0829  WBC 6.3  NEUTROABS 4.0  HGB 13.0  HCT 40.5  MCV 97.8  PLT [id number]   Basic Metabolic Panel: Recent Labs  Lab 10/08/19 0829  NA 139  K 4.1  CL 106  CO2 23  GLUCOSE 109*  BUN 31*  CREATININE 1.92*  CALCIUM 9.5   GFR: Estimated Creatinine Clearance: 25.8 mL/min (A) (by C-G formula based on SCr of 1.92 mg/dL (H)). Liver Function Tests: Recent Labs  Lab 10/08/19 0829  AST 18  ALT 16  ALKPHOS 60  BILITOT 0.7  PROT 7.5  ALBUMIN 4.3   No results for input(s): LIPASE, AMYLASE in the last 168 hours. No results for input(s): AMMONIA in the last 168 hours. Coagulation Profile: Recent Labs  Lab 10/08/19 0829  INR 0.9   Cardiac Enzymes: No results for input(s): CKTOTAL, CKMB, CKMBINDEX, TROPONINI in the last 168 hours. BNP (last 3 results) No results for input(s): PROBNP in the last 8760 hours. HbA1C: Recent Labs    10/08/19 0829  HGBA1C 7.9*   CBG: Recent Labs  Lab 10/08/19 2202 10/09/19 0554 10/09/19 1135  GLUCAP 137* 82 152*   Lipid  Profile: No results for input(s): CHOL, HDL, LDLCALC, TRIG, CHOLHDL, LDLDIRECT in the last 72 hours. Thyroid Function Tests: No results for input(s): TSH, T4TOTAL, FREET4, T3FREE, THYROIDAB in the last 72 hours. Anemia Panel: No results for input(s): VITAMINB12, FOLATE, FERRITIN, TIBC, IRON, RETICCTPCT in the last 72 hours. Sepsis Labs: No results for input(s): PROCALCITON, LATICACIDVEN in the last 168 hours.  Recent Results (from the past 240 hour(s))  SARS CORONAVIRUS 2 (TAT 6-24 HRS) Nasopharyngeal Nasopharyngeal Swab     Status: None   Collection Time: 10/08/19  5:19 PM   Specimen: Nasopharyngeal Swab  Result Value Ref Range Status   SARS Coronavirus 2 NEGATIVE NEGATIVE Final    Comment: (NOTE) SARS-CoV-2 target nucleic acids are NOT DETECTED. The SARS-CoV-2 RNA is generally detectable in upper and lower respiratory specimens during the acute phase of infection. Negative results do not preclude SARS-CoV-2 infection, do not rule out co-infections with other pathogens, and should not be used as the sole basis for treatment or other patient management decisions. Negative results must be combined with clinical observations, patient history, and epidemiological information. The expected result is Negative. Fact Sheet for Patients: SugarRoll.be Fact Sheet for Healthcare Providers: https://www.woods-mathews.com/ This test is not yet approved or cleared by the [place] FDA and  has been authorized for detection and/or diagnosis of SARS-CoV-2 by  FDA under an Emergency Use Authorization (EUA). This EUA will remain  in effect (meaning this test can be used) for the duration of the COVID-19 declaration under Section 56 4(b)(1) of the Act, 21 U.S.C. section 360bbb-3(b)(1), unless the authorization is terminated or revoked sooner. Performed at [facility], [place] [address], [place], [place] [address]          Radiology Studies: CT  HEAD WO CONTRAST  Result Date: 10/08/2019 CLINICAL DATA:  Difficulty walking. EXAM: CT HEAD WITHOUT CONTRAST TECHNIQUE: Contiguous axial images were obtained from the base of the skull through the vertex without intravenous contrast. COMPARISON:  May 08, 2019. FINDINGS: Brain: Mild chronic ischemic white matter disease is noted. No mass effect or midline shift is noted. Ventricular size is within normal limits. There is no evidence of mass lesion, hemorrhage or acute infarction. Vascular: No hyperdense vessel or unexpected calcification. Skull: Normal. Negative for fracture or focal lesion. Sinuses/Orbits: No acute finding. Other: None. IMPRESSION: Mild chronic ischemic white matter disease. No acute intracranial abnormality seen. Electronically Signed   By: Marijo Conception M.D.   On: 10/08/2019 10:48   MR BRAIN WO CONTRAST  Result Date: 10/08/2019 CLINICAL DATA:  Awoke today with weakness in the left leg. EXAM: MRI HEAD WITHOUT CONTRAST TECHNIQUE: Multiplanar, multiecho pulse sequences of the brain and surrounding structures were obtained without intravenous contrast. COMPARISON:  CT same day.  CT 05/08/2019. FINDINGS: Brain: Diffusion imaging shows a small region of restricted diffusion along the right lateral thalamus/posterior limb internal capsule. This probably explains the clinical presentation. No other acute or subacute infarction. Elsewhere, brainstem shows chronic small-vessel ischemic changes of the pons. No focal cerebellar insult. Cerebral hemispheres show mild chronic small-vessel change of the white matter for a person of this age. No large vessel territory infarction. No mass lesion, hemorrhage, hydrocephalus or extra-axial collection. Vascular: Major vessels at the base of the brain show flow. Skull and upper cervical spine: Negative Sinuses/Orbits: Sinuses are clear except for opacification of a posterior ethmoid air cell on the right. Orbits are negative. Other: None IMPRESSION: Small  region of acute infarction on the right affecting the lateral thalamus/posterior limb internal capsule. Age related chronic small-vessel ischemic changes elsewhere throughout the brain as above. Electronically Signed   By: Nelson Chimes M.D.   On: 10/08/2019 14:46   ECHOCARDIOGRAM COMPLETE  Result Date: 10/09/2019   ECHOCARDIOGRAM REPORT   Patient Name:   Erik Moss Date of Exam: 10/09/2019 Medical Rec #:  [id number]        Height:       66.0 in Accession #:    [id number]       Weight:       144.6 lb Date of Birth:  1933-09-18        BSA:          1.74 m Patient Age:    46 years         BP:           145/99 mmHg Patient Gender: M                HR:           89 bpm. Exam Location:  Inpatient Procedure: 2D Echo Indications:    Stroke 434.91 / I163.9  History:        Patient has no prior history of Echocardiogram examinations.                 Stroke; Risk  Factors:Diabetes, Dyslipidemia, Hypertension and                 Non-Smoker. Acute Renal Failure.  Sonographer:    Leavy Cella Referring Phys: [id number] DAVID MANUEL [place]  1. There is a small mass (0.6 cm x 0.6 cm) that appears to be attached to the [place] of the AoV. In certain views it appears to be focal calcification, but there does appear to be a mobile component to it in the basal SAX views. This could represent a papillary fibroelastoma vs vegetation. Would recommend a TEE for better characterization, especially in this patient with concerns for stroke.  2. Left ventricular ejection fraction, by visual estimation, is 65 to 70%. The left ventricle has normal function. There is no left ventricular hypertrophy.  3. Global right ventricle has normal systolic function.The right ventricular size is normal. No increase in right ventricular wall thickness.  4. Presence of pericardial fat pad.  5. Small pericardial effusion.  6. The pericardial effusion is circumferential.  7. Mild mitral annular calcification.  8. The mitral valve is grossly  normal. Trivial mitral valve regurgitation.  9. The tricuspid valve is grossly normal. Tricuspid valve regurgitation is trivial. 10. The aortic valve is tricuspid. Aortic valve regurgitation is not visualized. No evidence of aortic valve sclerosis or stenosis. 11. Normal pulmonary artery systolic pressure. 12. The inferior vena cava is normal in size with greater than 50% respiratory variability, suggesting right atrial pressure of 3 mmHg. 13. The pulmonic valve was grossly normal. Pulmonic valve regurgitation is trivial. 14. The left ventricle has no regional wall motion abnormalities. 15. Indeterminate diastolic filling due to E-A fusion. 16. Left atrial size was normal. 17. Right atrial size was normal. 18. The tricuspid regurgitant velocity is 2.17 m/s, and with an assumed right atrial pressure of 3 mmHg, the estimated right ventricular systolic pressure is normal at 21.8 mmHg. FINDINGS  Left Ventricle: Left ventricular ejection fraction, by visual estimation, is 65 to 70%. The left ventricle has normal function. The left ventricle has no regional wall motion abnormalities. There is no left ventricular hypertrophy. Indeterminate diastolic filling due to E-A fusion. Right Ventricle: The right ventricular size is normal. No increase in right ventricular wall thickness. Global RV systolic function is has normal systolic function. The tricuspid regurgitant velocity is 2.17 m/s, and with an assumed right atrial pressure  of 3 mmHg, the estimated right ventricular systolic pressure is normal at 21.8 mmHg. Left Atrium: Left atrial size was normal in size. Right Atrium: Right atrial size was normal in size Pericardium: A small pericardial effusion is present. The pericardial effusion is circumferential. Presence of pericardial fat pad. Mitral Valve: The mitral valve is grossly normal. Mild mitral annular calcification. Trivial mitral valve regurgitation. Tricuspid Valve: The tricuspid valve is grossly normal. Tricuspid  valve regurgitation is trivial. Aortic Valve: The aortic valve is tricuspid. Aortic valve regurgitation is not visualized. The aortic valve is structurally normal, with no evidence of sclerosis or stenosis. There is a small mass (0.6 cm x 0.6 cm) that appears to be attached to the [place] of the AoV. In certain views it appears to be focal calcification, but there does appear to be a mobile component to it in the basal SAX views. This could represent a papillary fibroelastoma vs vegetation. Would recommend a TEE for better characterization, especially in this patient with concerns for stroke. Pulmonic Valve: The pulmonic valve was grossly normal. Pulmonic valve regurgitation is trivial. Pulmonic regurgitation is trivial. Aorta: The  aortic root is normal in size and structure. Venous: The inferior vena cava is normal in size with greater than 50% respiratory variability, suggesting right atrial pressure of 3 mmHg. IAS/Shunts: No atrial level shunt detected by color flow Doppler.  LEFT VENTRICLE PLAX 2D LVIDd:         2.70 cm  Diastology LVIDs:         2.00 cm  LV e' lateral:   7.72 cm/s LV PW:         1.30 cm  LV E/e' lateral: 8.8 LV IVS:        1.10 cm  LV e' medial:    5.77 cm/s LVOT diam:     1.80 cm  LV E/e' medial:  11.8 LV SV:         14 ml LV SV Index:   8.15 LVOT Area:     2.54 cm  RIGHT VENTRICLE RV S prime:     6.34 cm/s TAPSE (M-mode): 2.4 cm LEFT ATRIUM             Index       RIGHT ATRIUM           Index LA diam:        3.30 cm 1.89 cm/m  RA Area:     12.70 cm LA Vol (A2C):   36.6 ml 21.01 ml/m RA Volume:   26.90 ml  15.44 ml/m LA Vol (A4C):   33.1 ml 19.00 ml/m LA Biplane Vol: 36.0 ml 20.66 ml/m   AORTA Ao Root diam: 2.90 cm MITRAL VALVE                         TRICUSPID VALVE MV Area (PHT): 5.27 cm              TR Peak grad:   18.8 mmHg MV PHT:        41.76 msec            TR Vmax:        217.00 cm/s MV Decel Time: 144 msec MV E velocity: 68.30 cm/s  103 cm/s  SHUNTS MV A velocity: 101.00 cm/s 70.3  cm/s Systemic Diam: 1.80 cm MV E/A ratio:  0.68        1.5  Eleonore Chiquito MD Electronically signed by Eleonore Chiquito MD Signature Date/Time: 10/09/2019/10:49:27 AM    Final         Scheduled Meds: .  stroke: mapping our early stages of recovery book   Does not apply Once  . aspirin EC  81 mg Oral Daily  . atorvastatin  80 mg Oral q1800  . clopidogrel  75 mg Oral Daily  . ferrous gluconate  648 mg Oral Daily  . glimepiride  1 mg Oral Q breakfast  . linagliptin  5 mg Oral Daily  . sodium bicarbonate  650 mg Oral BID  . tamsulosin  0.4 mg Oral QHS   Continuous Infusions:   LOS: 1 day    Time spent: 36 minutes spent on chart review, discussion with nursing staff, consultants, updating family and interview/physical exam; more than 50% of that time was spent in counseling and/or coordination of care.    Zi Sek J [place] DO [facility] 10/09/2019, 11:45 AM

## 2019-10-09 NOTE — Progress Notes (Signed)
*  PRELIMINARY RESULTS* Echocardiogram 2D Echocardiogram has been performed.  Erik Moss 10/09/2019, 9:35 AM

## 2019-10-09 NOTE — Progress Notes (Signed)
STROKE TEAM PROGRESS NOTE   INTERVAL HISTORY Pt sitting in chair. He said his left leg much improved, not dragging anymore. Still pending MRA head and neck. He is hoping to go home today.   He told me that he has been working on his classes for his degree. He had his final exam one week ago but he failed. He is asking whether his health condition can be the cause of his failure. I told him that the stroke occurred after his test so the stroke likely not the cause for him to fail, but his ongoing chronic condition like DM, HTN, HLD may cause cognitive impairment which might be the cause of his failure. He is in agreement and asking whether a letter can be written for him to give to his professor. I recommend him to contact his PCP.   Vitals:   10/09/19 0229 10/09/19 0437 10/09/19 0700 10/09/19 0756  BP: (!) 111/57 (!) 142/80 124/68 (!) 145/99  Pulse: 65 77  89  Resp: 18 20 18    Temp: 98.4 F (36.9 C) 98.6 F (37 C) 98.7 F (37.1 C) 97.6 F (36.4 C)  TempSrc: Oral Oral Oral Oral  SpO2: 98% 100% 98% 98%  Weight:      Height:        CBC:  Recent Labs  Lab 10/08/19 0829  WBC 6.3  NEUTROABS 4.0  HGB 13.0  HCT 40.5  MCV 97.8  PLT [id number]    Basic Metabolic Panel:  Recent Labs  Lab 10/08/19 0829  NA 139  K 4.1  CL 106  CO2 23  GLUCOSE 109*  BUN 31*  CREATININE 1.92*  CALCIUM 9.5   Lipid Panel: No results found for: CHOL, TRIG, HDL, CHOLHDL, VLDL, LDLCALC HgbA1c:  Lab Results  Component Value Date   HGBA1C 7.9 (H) 10/08/2019   Urine Drug Screen:     Component Value Date/Time   LABOPIA NONE DETECTED 10/08/2019 0830   COCAINSCRNUR NONE DETECTED 10/08/2019 0830   LABBENZ NONE DETECTED 10/08/2019 0830   AMPHETMU NONE DETECTED 10/08/2019 0830   THCU NONE DETECTED 10/08/2019 0830   LABBARB NONE DETECTED 10/08/2019 0830    Alcohol Level     Component Value Date/Time   ETH <10 10/08/2019 0829    IMAGING CT HEAD WO CONTRAST  Result Date: 10/08/2019 CLINICAL DATA:   Difficulty walking. EXAM: CT HEAD WITHOUT CONTRAST TECHNIQUE: Contiguous axial images were obtained from the base of the skull through the vertex without intravenous contrast. COMPARISON:  May 08, 2019. FINDINGS: Brain: Mild chronic ischemic white matter disease is noted. No mass effect or midline shift is noted. Ventricular size is within normal limits. There is no evidence of mass lesion, hemorrhage or acute infarction. Vascular: No hyperdense vessel or unexpected calcification. Skull: Normal. Negative for fracture or focal lesion. Sinuses/Orbits: No acute finding. Other: None. IMPRESSION: Mild chronic ischemic white matter disease. No acute intracranial abnormality seen. Electronically Signed   By: Marijo Conception M.D.   On: 10/08/2019 10:48   MR BRAIN WO CONTRAST  Result Date: 10/08/2019 CLINICAL DATA:  Awoke today with weakness in the left leg. EXAM: MRI HEAD WITHOUT CONTRAST TECHNIQUE: Multiplanar, multiecho pulse sequences of the brain and surrounding structures were obtained without intravenous contrast. COMPARISON:  CT same day.  CT 05/08/2019. FINDINGS: Brain: Diffusion imaging shows a small region of restricted diffusion along the right lateral thalamus/posterior limb internal capsule. This probably explains the clinical presentation. No other acute or subacute infarction. Elsewhere, brainstem shows chronic  small-vessel ischemic changes of the pons. No focal cerebellar insult. Cerebral hemispheres show mild chronic small-vessel change of the white matter for a person of this age. No large vessel territory infarction. No mass lesion, hemorrhage, hydrocephalus or extra-axial collection. Vascular: Major vessels at the base of the brain show flow. Skull and upper cervical spine: Negative Sinuses/Orbits: Sinuses are clear except for opacification of a posterior ethmoid air cell on the right. Orbits are negative. Other: None IMPRESSION: Small region of acute infarction on the right affecting the lateral  thalamus/posterior limb internal capsule. Age related chronic small-vessel ischemic changes elsewhere throughout the brain as above. Electronically Signed   By: Nelson Chimes M.D.   On: 10/08/2019 14:46   ECHOCARDIOGRAM COMPLETE  Result Date: 10/09/2019   ECHOCARDIOGRAM REPORT   Patient Name:   Erik Moss Date of Exam: 10/09/2019 Medical Rec #:  [id number]        Height:       66.0 in Accession #:    [id number]       Weight:       144.6 lb Date of Birth:  1932-12-10        BSA:          1.74 m Patient Age:    85 years         BP:           145/99 mmHg Patient Gender: M                HR:           89 bpm. Exam Location:  Inpatient Procedure: 2D Echo Indications:    Stroke 434.91 / I163.9  History:        Patient has no prior history of Echocardiogram examinations.                 Stroke; Risk Factors:Diabetes, Dyslipidemia, Hypertension and                 Non-Smoker. Acute Renal Failure.  Sonographer:    Leavy Cella Referring Phys: [id number] DAVID MANUEL [place]  1. There is a small mass (0.6 cm x 0.6 cm) that appears to be attached to the [place] of the AoV. In certain views it appears to be focal calcification, but there does appear to be a mobile component to it in the basal SAX views. This could represent a papillary fibroelastoma vs vegetation. Would recommend a TEE for better characterization, especially in this patient with concerns for stroke.  2. Left ventricular ejection fraction, by visual estimation, is 65 to 70%. The left ventricle has normal function. There is no left ventricular hypertrophy.  3. Global right ventricle has normal systolic function.The right ventricular size is normal. No increase in right ventricular wall thickness.  4. Presence of pericardial fat pad.  5. Small pericardial effusion.  6. The pericardial effusion is circumferential.  7. Mild mitral annular calcification.  8. The mitral valve is grossly normal. Trivial mitral valve regurgitation.  9. The tricuspid valve  is grossly normal. Tricuspid valve regurgitation is trivial. 10. The aortic valve is tricuspid. Aortic valve regurgitation is not visualized. No evidence of aortic valve sclerosis or stenosis. 11. Normal pulmonary artery systolic pressure. 12. The inferior vena cava is normal in size with greater than 50% respiratory variability, suggesting right atrial pressure of 3 mmHg. 13. The pulmonic valve was grossly normal. Pulmonic valve regurgitation is trivial. 14. The left ventricle has no regional wall motion abnormalities. 15. Indeterminate diastolic filling  due to E-A fusion. 16. Left atrial size was normal. 17. Right atrial size was normal. 18. The tricuspid regurgitant velocity is 2.17 m/s, and with an assumed right atrial pressure of 3 mmHg, the estimated right ventricular systolic pressure is normal at 21.8 mmHg. FINDINGS  Left Ventricle: Left ventricular ejection fraction, by visual estimation, is 65 to 70%. The left ventricle has normal function. The left ventricle has no regional wall motion abnormalities. There is no left ventricular hypertrophy. Indeterminate diastolic filling due to E-A fusion. Right Ventricle: The right ventricular size is normal. No increase in right ventricular wall thickness. Global RV systolic function is has normal systolic function. The tricuspid regurgitant velocity is 2.17 m/s, and with an assumed right atrial pressure  of 3 mmHg, the estimated right ventricular systolic pressure is normal at 21.8 mmHg. Left Atrium: Left atrial size was normal in size. Right Atrium: Right atrial size was normal in size Pericardium: A small pericardial effusion is present. The pericardial effusion is circumferential. Presence of pericardial fat pad. Mitral Valve: The mitral valve is grossly normal. Mild mitral annular calcification. Trivial mitral valve regurgitation. Tricuspid Valve: The tricuspid valve is grossly normal. Tricuspid valve regurgitation is trivial. Aortic Valve: The aortic valve is  tricuspid. Aortic valve regurgitation is not visualized. The aortic valve is structurally normal, with no evidence of sclerosis or stenosis. There is a small mass (0.6 cm x 0.6 cm) that appears to be attached to the [place] of the AoV. In certain views it appears to be focal calcification, but there does appear to be a mobile component to it in the basal SAX views. This could represent a papillary fibroelastoma vs vegetation. Would recommend a TEE for better characterization, especially in this patient with concerns for stroke. Pulmonic Valve: The pulmonic valve was grossly normal. Pulmonic valve regurgitation is trivial. Pulmonic regurgitation is trivial. Aorta: The aortic root is normal in size and structure. Venous: The inferior vena cava is normal in size with greater than 50% respiratory variability, suggesting right atrial pressure of 3 mmHg. IAS/Shunts: No atrial level shunt detected by color flow Doppler.  LEFT VENTRICLE PLAX 2D LVIDd:         2.70 cm  Diastology LVIDs:         2.00 cm  LV e' lateral:   7.72 cm/s LV PW:         1.30 cm  LV E/e' lateral: 8.8 LV IVS:        1.10 cm  LV e' medial:    5.77 cm/s LVOT diam:     1.80 cm  LV E/e' medial:  11.8 LV SV:         14 ml LV SV Index:   8.15 LVOT Area:     2.54 cm  RIGHT VENTRICLE RV S prime:     6.34 cm/s TAPSE (M-mode): 2.4 cm LEFT ATRIUM             Index       RIGHT ATRIUM           Index LA diam:        3.30 cm 1.89 cm/m  RA Area:     12.70 cm LA Vol (A2C):   36.6 ml 21.01 ml/m RA Volume:   26.90 ml  15.44 ml/m LA Vol (A4C):   33.1 ml 19.00 ml/m LA Biplane Vol: 36.0 ml 20.66 ml/m   AORTA Ao Root diam: 2.90 cm MITRAL VALVE  TRICUSPID VALVE MV Area (PHT): 5.27 cm              TR Peak grad:   18.8 mmHg MV PHT:        41.76 msec            TR Vmax:        217.00 cm/s MV Decel Time: 144 msec MV E velocity: 68.30 cm/s  103 cm/s  SHUNTS MV A velocity: 101.00 cm/s 70.3 cm/s Systemic Diam: 1.80 cm MV E/A ratio:  0.68        1.5   Eleonore Chiquito MD Electronically signed by Eleonore Chiquito MD Signature Date/Time: 10/09/2019/10:49:27 AM    Final     PHYSICAL EXAM  Temp:  [97.6 F (36.4 C)-98.7 F (37.1 C)] 98.1 F (36.7 C) (12/16 1534) Pulse Rate:  [57-89] 79 (12/16 1534) Resp:  [13-20] 18 (12/16 1534) BP: (109-161)/(57-99) 132/81 (12/16 1534) SpO2:  [98 %-100 %] 98 % (12/16 1534) Weight:  [65.6 kg] 65.6 kg (12/15 2130)  General - Well nourished, well developed, in no apparent distress.  Ophthalmologic - fundi not visualized due to noncooperation.  Cardiovascular - Regular rhythm and rate.  Mental Status -  Level of arousal and orientation to time, place, and person were intact. Language including expression, naming, repetition, comprehension was assessed and found intact. Fund of Knowledge was assessed and was intact.  Cranial Nerves II - XII - II - Visual field intact OU. III, IV, VI - Extraocular movements intact. V - Facial sensation intact bilaterally. VII - Facial movement intact bilaterally. VIII - Hearing & vestibular intact bilaterally. X - Palate elevates symmetrically. XI - Chin turning & shoulder shrug intact bilaterally. XII - Tongue protrusion intact.  Motor Strength - The patient's strength was normal in all extremities and pronator drift was absent.  Bulk was normal and fasciculations were absent.   Motor Tone - Muscle tone was assessed at the neck and appendages and was normal.  Reflexes - The patient's reflexes were symmetrical in all extremities and he had no pathological reflexes.  Sensory - Light touch, temperature/pinprick were assessed and were symmetrical.    Coordination - The patient had normal movements in the hands and feet with no ataxia or dysmetria.  Tremor was absent.  Gait and Station - deferred.   ASSESSMENT/PLAN Mr. Hartsell Kohlbeck is a 85 y.o. male with history of HTN, HLD, DB who woke with difficulty walking with L leg weakness, resolving prior to arrival to ED.    Stroke:  right lateral thalamic/PLLIC infarct likely secondary to small vessel disease source. However, cardioembolic source needs to rule out given small intracardiac mass  CT head No acute abnormality. Small vessel disease.    MRI  Small R lateral thalamus/PLIC infarct . Small vessel disease.   MRA head  pending  MRA neck pending   2D Echo small mass on NCC appears to be mobile (calcificaiton, papillary fibroelastoma vs veg). EF 65-70%.  TEE pending   LDL pending    HgbA1c 7.9  SCDs for VTE prophylaxis  No antithrombotic prior to admission, now on aspirin 81 mg daily and clopidogrel 75 mg daily.   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 EF 65-70%.  TEE pending   Blood cultures pending   Hypertension  Stable . Permissive hypertension (OK if < 220/120) but gradually normalize in 5-7  days . Long-term BP goal normotensive  Hyperlipidemia  Home meds:  zocor 20  Now on lipitor 80  LDL pending, goal < 70  Continue statin at discharge  Diabetes type II Uncontrolled  HgbA1c 7.9, goal < 7.0  CBGs  SSI  Close PCP follow up for better DM control  Other Stroke Risk Factors  Advanced age  Other Active Problems  BPH on flomax  CKD stage IIIb, Cre 1.92  Mild cognitive impairment - He told me that he has been working on his classes for his degree. He had his final exam one week ago but he failed. He is asking whether his health condition can be the cause of his failure. I told him that the stroke occurred after his exam so the stroke likely not the cause for him to fail, but his ongoing chronic condition like DM, HTN, HLD may cause cognitive impairment which might be the cause of his failure. He is in agreement and asking whether a letter can be written for him to give to his professor. I recommend him to contact his  PCP.   Hospital day # 1  Rosalin Hawking, MD PhD Stroke Neurology 10/09/2019 6:00 PM  To contact Stroke Continuity provider, please refer to [email or web address] After hours, contact General Neurology

## 2019-10-09 NOTE — Progress Notes (Addendum)
PROGRESS NOTE    [place]  [id number] DOB: 1933-02-04 DOA: 10/08/2019 PCP: [name], FNP    Brief Narrative:   Jaren Kostelecky is a 85 y.o. male with medical history significant of BPH, type 2 diabetes, hyperlipidemia, hypertension, who is coming to the emergency department due to having difficulty walking as he felt that his left lower extremity was dragging.  However, by the time that the patient came to the emergency department his symptoms have mostly resolved.  He denies headache, blurred vision, nausea or vomiting.  He denies fever, chills, sore throat, wheezing, hemoptysis, chest pain, palpitations, dizziness, diaphoresis, PND, orthopnea or pitting edema of the lower extremities.  Denies abdominal pain, diarrhea, constipation, melena or hematochezia.  No dysuria, frequency or hematuria.  The patient has nocturia.  He denies polyuria, polydipsia, polyphagia or blurred vision.  ED Course: His urinalysis shows small hemoglobinuria and rare bacteria on micro exam.  UDS was negative.  CBC was normal.  Alcohol less than 10 mg/dL.  CMP showed a glucose of 109, BUN of 31, creatinine 1.92 mg/dL.  All other values are within normal limits.  Imaging: CT head showed mild chronic ischemic white matter disease but no intracranial normality was seen.  However MRI of head without contrast showed a small region of acute infarction on the right affecting the lateral thalamus/posterior limb internal capsule.  Please see images and full regular report for further detail.   Assessment & Plan:   Principal Problem:   Acute CVA (cerebrovascular accident) [facility]) Active Problems:   Type 2 diabetes mellitus ([place])   Hyperlipidemia   BPH (benign prostatic hyperplasia)   Hypertension   Acute ischemic stroke Patient presenting the ED with left lower extremity weakness.  CT head unrevealing other than mild chronic ischemic white matter disease.  MRI head without contrast small acute  infarction right lateral thalamus/posterior limb internal capsule. 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  Right lower extremity weakness markedly improved since initial presentation.  PT recommends outpatient PT. --Neurology following, appreciate assistance --MRA head/neck without contrast: Pending --TEE pending for further evaluation of aortic valve --Pending OT/speech therapy evaluation --Aspirin 81 mg p.o. daily --Plavix 75 mg p.o. daily --Atorvastatin 80 mg p.o. 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 --Blood cultures x2, ordered --Consulted cardiology for TEE; likely plan for tomorrow  Type 2 diabetes mellitus Home regimen includes glimepiride 1 mg p.o. daily, Januvia 25 mg p.o. daily. --Check hemoglobin A1c --Continue glimepiride 1 mg p.o. daily, linagliptin 5 mg p.o. daily --Monitor CBGs before every meal at bedtime  Hyperlipidemia Was previously on simvastatin 20 mg p.o. daily. --Check lipid profile --changed to high intensity atorvastatin 80 mg p.o. daily  Essential hypertension BP 133/82, fairly well controlled.  On amlodipine 5mg  PO daily at home. --We will continue to hold home amlodipine to allow for permissive hypertension  BPH --Continue tamsulosin 0.4 mg p.o. nightly --Strict I's and O's --Monitor renal function daily  CKD stage IV Patient with previous hospitalization July 2020 with fatigue and was found to have renal failure with creatinine up to 3.02 and hyperkalemia with potassium of 6.1.  Recently moved from [place] to live with daughter.  Received Lokelma, bicarb, D50/insulin with improvement of his hyperkalemia during that admission.  Renal ultrasound with no  hydronephrosis with  increased echotexture consistent with medical renal disease; and bilateral renal cysts with one with septations.  Creatinine improved to 1.95 at time of discharge.  Creatinine on presentation this admission 1.92, likely his new baseline. --Continue to monitor renal function daily --Sodium bicarbonate 650 mg p.o. twice daily --Avoid nephrotoxins, renally dose all medications --Outpatient follow-up with nephrology   DVT prophylaxis: Heparin Code Status: Full code Family Communication: None present at bedside Disposition Plan: Continue inpatient, will need TEE, scheduled likely for tomorrow, further depending on clinical course and specialist evaluation.  Likely discharge home when medically ready with outpatient PT follow-up.   Consultants:   Neurology  Cardiology for TEE  Procedures:   Transthoracic echocardiogram: IMPRESSIONS    1. There is a small mass (0.6 cm x 0.6 cm) that appears to be attached to the [place] of the AoV. In certain views it appears to be focal calcification, but there does appear to be a mobile component to it in the basal SAX views. This could represent a  papillary fibroelastoma vs vegetation. Would recommend a TEE for better characterization, especially in this patient with concerns for stroke.  2. Left ventricular ejection fraction, by visual estimation, is 65 to 70%. The left ventricle has normal function. There is no left ventricular hypertrophy.  3. Global right ventricle has normal systolic function.The right ventricular size is normal. No increase in right ventricular wall thickness.  4. Presence of pericardial fat pad.  5. Small pericardial effusion.  6. The pericardial effusion is circumferential.  7. Mild mitral annular calcification.  8. The mitral valve is grossly normal. Trivial mitral valve regurgitation.  9. The tricuspid valve is grossly normal. Tricuspid valve regurgitation is trivial. 10. The aortic valve is tricuspid.  Aortic valve regurgitation is not visualized. No evidence of aortic valve sclerosis or stenosis. 11. Normal pulmonary artery systolic pressure. 12. The inferior vena cava is normal in size with greater than 50% respiratory variability, suggesting right atrial pressure of 3 mmHg. 13. The pulmonic valve was grossly normal. Pulmonic valve regurgitation is trivial. 14. The left ventricle has no regional wall motion abnormalities. 15. Indeterminate diastolic filling due to E-A fusion. 16. Left atrial size was normal. 17. Right atrial size was normal. 18. The tricuspid regurgitant velocity is 2.17 m/s, and with an assumed right atrial pressure of 3 mmHg, the estimated right ventricular systolic pressure is normal at 21.8 mmHg.   Antimicrobials:   none   Subjective: Patient seen and examined bedside, resting comfortably.  Reports that his left lower extremity strength has improved greatly since initial presentation, but continues with some mild weakness; especially with ambulation.  Reports that he is on his 10th masters degree, but feels that he did poorly with his recent final exams.  No other complaints or concerns at this time.  Received notification from cardiology that there is a possible aortic valve mass concerning for vegetation versus fibroblastoma and recommend TEE.  Patient without any other complaints or concerns at this time.  Denies headache, no chest pain, palpitations, no fever/chills/night sweats, no nausea/vomiting/diarrhea, no shortness of breath, no abdominal pain, no fatigue, no paresthesias.  No acute events overnight per nursing staff.  Objective: Vitals:   10/09/19 0437 10/09/19 0700 10/09/19 0756 10/09/19 1135  BP: (!) 142/80 124/68 (!) 145/99 119/63  Pulse: 77  89 66  Resp: 20 18  19   Temp: 98.6 F (37 C) 98.7 F (37.1 C) 97.6 F (36.4 C)   TempSrc: Oral Oral Oral   SpO2: 100% 98%  98% 98%  Weight:      Height:        Intake/Output Summary (Last 24 hours) at  10/09/2019 1145 Last data filed at 10/09/2019 0744 Gross per 24 hour  Intake --  Output 200 ml  Net -200 ml   Filed Weights   10/08/19 2130  Weight: 65.6 kg    Examination:  General exam: Appears calm and comfortable  Respiratory system: Clear to auscultation. Respiratory effort normal. Cardiovascular system: S1 & S2 heard, RRR. No JVD, murmurs, rubs, gallops or clicks. No pedal edema. Gastrointestinal system: Abdomen is nondistended, soft and nontender. No organomegaly or masses felt. Normal bowel sounds heard. Central nervous system: Alert and oriented. No focal neurological deficits. Extremities: Moves all extremities independently, very slight asymmetric weakness left lower extremity. Skin: No rashes, lesions or ulcers Psychiatry: Judgement and insight appear normal. Mood & affect appropriate.     Data Reviewed: I have personally reviewed following labs and imaging studies  CBC: Recent Labs  Lab 10/08/19 0829  WBC 6.3  NEUTROABS 4.0  HGB 13.0  HCT 40.5  MCV 97.8  PLT [id number]   Basic Metabolic Panel: Recent Labs  Lab 10/08/19 0829  NA 139  K 4.1  CL 106  CO2 23  GLUCOSE 109*  BUN 31*  CREATININE 1.92*  CALCIUM 9.5   GFR: Estimated Creatinine Clearance: 25.8 mL/min (A) (by C-G formula based on SCr of 1.92 mg/dL (H)). Liver Function Tests: Recent Labs  Lab 10/08/19 0829  AST 18  ALT 16  ALKPHOS 60  BILITOT 0.7  PROT 7.5  ALBUMIN 4.3   No results for input(s): LIPASE, AMYLASE in the last 168 hours. No results for input(s): AMMONIA in the last 168 hours. Coagulation Profile: Recent Labs  Lab 10/08/19 0829  INR 0.9   Cardiac Enzymes: No results for input(s): CKTOTAL, CKMB, CKMBINDEX, TROPONINI in the last 168 hours. BNP (last 3 results) No results for input(s): PROBNP in the last 8760 hours. HbA1C: Recent Labs    10/08/19 0829  HGBA1C 7.9*   CBG: Recent Labs  Lab 10/08/19 2202 10/09/19 0554 10/09/19 1135  GLUCAP 137* 82 152*   Lipid  Profile: No results for input(s): CHOL, HDL, LDLCALC, TRIG, CHOLHDL, LDLDIRECT in the last 72 hours. Thyroid Function Tests: No results for input(s): TSH, T4TOTAL, FREET4, T3FREE, THYROIDAB in the last 72 hours. Anemia Panel: No results for input(s): VITAMINB12, FOLATE, FERRITIN, TIBC, IRON, RETICCTPCT in the last 72 hours. Sepsis Labs: No results for input(s): PROCALCITON, LATICACIDVEN in the last 168 hours.  Recent Results (from the past 240 hour(s))  SARS CORONAVIRUS 2 (TAT 6-24 HRS) Nasopharyngeal Nasopharyngeal Swab     Status: None   Collection Time: 10/08/19  5:19 PM   Specimen: Nasopharyngeal Swab  Result Value Ref Range Status   SARS Coronavirus 2 NEGATIVE NEGATIVE Final    Comment: (NOTE) SARS-CoV-2 target nucleic acids are NOT DETECTED. The SARS-CoV-2 RNA is generally detectable in upper and lower respiratory specimens during the acute phase of infection. Negative results do not preclude SARS-CoV-2 infection, do not rule out co-infections with other pathogens, and should not be used as the sole basis for treatment or other patient management decisions. Negative results must be combined with clinical observations, patient history, and epidemiological information. The expected result is Negative. Fact Sheet for Patients: SugarRoll.be Fact Sheet for Healthcare Providers: https://www.woods-mathews.com/ This test is not yet approved or cleared by the [place] FDA and  has been authorized for detection and/or diagnosis of SARS-CoV-2 by  FDA under an Emergency Use Authorization (EUA). This EUA will remain  in effect (meaning this test can be used) for the duration of the COVID-19 declaration under Section 56 4(b)(1) of the Act, 21 U.S.C. section 360bbb-3(b)(1), unless the authorization is terminated or revoked sooner. Performed at [facility], [place] [address] [place], [place] [address]          Radiology Studies: CT  HEAD WO CONTRAST  Result Date: 10/08/2019 CLINICAL DATA:  Difficulty walking. EXAM: CT HEAD WITHOUT CONTRAST TECHNIQUE: Contiguous axial images were obtained from the base of the skull through the vertex without intravenous contrast. COMPARISON:  May 08, 2019. FINDINGS: Brain: Mild chronic ischemic white matter disease is noted. No mass effect or midline shift is noted. Ventricular size is within normal limits. There is no evidence of mass lesion, hemorrhage or acute infarction. Vascular: No hyperdense vessel or unexpected calcification. Skull: Normal. Negative for fracture or focal lesion. Sinuses/Orbits: No acute finding. Other: None. IMPRESSION: Mild chronic ischemic white matter disease. No acute intracranial abnormality seen. Electronically Signed   By: Marijo Conception M.D.   On: 10/08/2019 10:48   MR BRAIN WO CONTRAST  Result Date: 10/08/2019 CLINICAL DATA:  Awoke today with weakness in the left leg. EXAM: MRI HEAD WITHOUT CONTRAST TECHNIQUE: Multiplanar, multiecho pulse sequences of the brain and surrounding structures were obtained without intravenous contrast. COMPARISON:  CT same day.  CT 05/08/2019. FINDINGS: Brain: Diffusion imaging shows a small region of restricted diffusion along the right lateral thalamus/posterior limb internal capsule. This probably explains the clinical presentation. No other acute or subacute infarction. Elsewhere, brainstem shows chronic small-vessel ischemic changes of the pons. No focal cerebellar insult. Cerebral hemispheres show mild chronic small-vessel change of the white matter for a person of this age. No large vessel territory infarction. No mass lesion, hemorrhage, hydrocephalus or extra-axial collection. Vascular: Major vessels at the base of the brain show flow. Skull and upper cervical spine: Negative Sinuses/Orbits: Sinuses are clear except for opacification of a posterior ethmoid air cell on the right. Orbits are negative. Other: None IMPRESSION: Small  region of acute infarction on the right affecting the lateral thalamus/posterior limb internal capsule. Age related chronic small-vessel ischemic changes elsewhere throughout the brain as above. Electronically Signed   By: Nelson Chimes M.D.   On: 10/08/2019 14:46   ECHOCARDIOGRAM COMPLETE  Result Date: 10/09/2019   ECHOCARDIOGRAM REPORT   Patient Name:   TERRIUS KIEBLER Date of Exam: 10/09/2019 Medical Rec #:  [id number]        Height:       66.0 in Accession #:    [id number]       Weight:       144.6 lb Date of Birth:  April 03, 1933        BSA:          1.74 m Patient Age:    78 years         BP:           145/99 mmHg Patient Gender: M                HR:           89 bpm. Exam Location:  Inpatient Procedure: 2D Echo Indications:    Stroke 434.91 / I163.9  History:        Patient has no prior history of Echocardiogram examinations.                 Stroke; Risk  Factors:Diabetes, Dyslipidemia, Hypertension and                 Non-Smoker. Acute Renal Failure.  Sonographer:    Leavy Cella Referring Phys: [id number] DAVID MANUEL [place]  1. There is a small mass (0.6 cm x 0.6 cm) that appears to be attached to the [place] of the AoV. In certain views it appears to be focal calcification, but there does appear to be a mobile component to it in the basal SAX views. This could represent a papillary fibroelastoma vs vegetation. Would recommend a TEE for better characterization, especially in this patient with concerns for stroke.  2. Left ventricular ejection fraction, by visual estimation, is 65 to 70%. The left ventricle has normal function. There is no left ventricular hypertrophy.  3. Global right ventricle has normal systolic function.The right ventricular size is normal. No increase in right ventricular wall thickness.  4. Presence of pericardial fat pad.  5. Small pericardial effusion.  6. The pericardial effusion is circumferential.  7. Mild mitral annular calcification.  8. The mitral valve is grossly  normal. Trivial mitral valve regurgitation.  9. The tricuspid valve is grossly normal. Tricuspid valve regurgitation is trivial. 10. The aortic valve is tricuspid. Aortic valve regurgitation is not visualized. No evidence of aortic valve sclerosis or stenosis. 11. Normal pulmonary artery systolic pressure. 12. The inferior vena cava is normal in size with greater than 50% respiratory variability, suggesting right atrial pressure of 3 mmHg. 13. The pulmonic valve was grossly normal. Pulmonic valve regurgitation is trivial. 14. The left ventricle has no regional wall motion abnormalities. 15. Indeterminate diastolic filling due to E-A fusion. 16. Left atrial size was normal. 17. Right atrial size was normal. 18. The tricuspid regurgitant velocity is 2.17 m/s, and with an assumed right atrial pressure of 3 mmHg, the estimated right ventricular systolic pressure is normal at 21.8 mmHg. FINDINGS  Left Ventricle: Left ventricular ejection fraction, by visual estimation, is 65 to 70%. The left ventricle has normal function. The left ventricle has no regional wall motion abnormalities. There is no left ventricular hypertrophy. Indeterminate diastolic filling due to E-A fusion. Right Ventricle: The right ventricular size is normal. No increase in right ventricular wall thickness. Global RV systolic function is has normal systolic function. The tricuspid regurgitant velocity is 2.17 m/s, and with an assumed right atrial pressure  of 3 mmHg, the estimated right ventricular systolic pressure is normal at 21.8 mmHg. Left Atrium: Left atrial size was normal in size. Right Atrium: Right atrial size was normal in size Pericardium: A small pericardial effusion is present. The pericardial effusion is circumferential. Presence of pericardial fat pad. Mitral Valve: The mitral valve is grossly normal. Mild mitral annular calcification. Trivial mitral valve regurgitation. Tricuspid Valve: The tricuspid valve is grossly normal. Tricuspid  valve regurgitation is trivial. Aortic Valve: The aortic valve is tricuspid. Aortic valve regurgitation is not visualized. The aortic valve is structurally normal, with no evidence of sclerosis or stenosis. There is a small mass (0.6 cm x 0.6 cm) that appears to be attached to the [place] of the AoV. In certain views it appears to be focal calcification, but there does appear to be a mobile component to it in the basal SAX views. This could represent a papillary fibroelastoma vs vegetation. Would recommend a TEE for better characterization, especially in this patient with concerns for stroke. Pulmonic Valve: The pulmonic valve was grossly normal. Pulmonic valve regurgitation is trivial. Pulmonic regurgitation is trivial. Aorta: The  aortic root is normal in size and structure. Venous: The inferior vena cava is normal in size with greater than 50% respiratory variability, suggesting right atrial pressure of 3 mmHg. IAS/Shunts: No atrial level shunt detected by color flow Doppler.  LEFT VENTRICLE PLAX 2D LVIDd:         2.70 cm  Diastology LVIDs:         2.00 cm  LV e' lateral:   7.72 cm/s LV PW:         1.30 cm  LV E/e' lateral: 8.8 LV IVS:        1.10 cm  LV e' medial:    5.77 cm/s LVOT diam:     1.80 cm  LV E/e' medial:  11.8 LV SV:         14 ml LV SV Index:   8.15 LVOT Area:     2.54 cm  RIGHT VENTRICLE RV S prime:     6.34 cm/s TAPSE (M-mode): 2.4 cm LEFT ATRIUM             Index       RIGHT ATRIUM           Index LA diam:        3.30 cm 1.89 cm/m  RA Area:     12.70 cm LA Vol (A2C):   36.6 ml 21.01 ml/m RA Volume:   26.90 ml  15.44 ml/m LA Vol (A4C):   33.1 ml 19.00 ml/m LA Biplane Vol: 36.0 ml 20.66 ml/m   AORTA Ao Root diam: 2.90 cm MITRAL VALVE                         TRICUSPID VALVE MV Area (PHT): 5.27 cm              TR Peak grad:   18.8 mmHg MV PHT:        41.76 msec            TR Vmax:        217.00 cm/s MV Decel Time: 144 msec MV E velocity: 68.30 cm/s  103 cm/s  SHUNTS MV A velocity: 101.00 cm/s 70.3  cm/s Systemic Diam: 1.80 cm MV E/A ratio:  0.68        1.5  Eleonore Chiquito MD Electronically signed by Eleonore Chiquito MD Signature Date/Time: 10/09/2019/10:49:27 AM    Final         Scheduled Meds:   stroke: mapping our early stages of recovery book   Does not apply Once   aspirin EC  81 mg Oral Daily   atorvastatin  80 mg Oral q1800   clopidogrel  75 mg Oral Daily   ferrous gluconate  648 mg Oral Daily   glimepiride  1 mg Oral Q breakfast   linagliptin  5 mg Oral Daily   sodium bicarbonate  650 mg Oral BID   tamsulosin  0.4 mg Oral QHS   Continuous Infusions:   LOS: 1 day    Time spent: 36 minutes spent on chart review, discussion with nursing staff, consultants, updating family and interview/physical exam; more than 50% of that time was spent in counseling and/or coordination of care.    Hitomi Slape J [place] DO [facility] 10/09/2019, 11:45 AM

## 2019-10-09 NOTE — Progress Notes (Signed)
    [facility] has been requested to perform a transesophageal echocardiogram on 10/10/19 for Stroke.  After careful review of history and examination, the risks and benefits of transesophageal echocardiogram have been explained including risks of esophageal damage, perforation (1:10,000 risk), bleeding, pharyngeal hematoma as well as other potential complications associated with conscious sedation including aspiration, arrhythmia, respiratory failure and death. Alternatives to treatment were discussed, questions were answered. Patient is willing to proceed. Labs and vital signs are stable.   Reino Bellis, NP-C 10/09/2019 3:11 PM

## 2019-10-09 NOTE — Evaluation (Signed)
Physical Therapy Evaluation Patient Details Name: Erik Moss MRN: [id number] DOB: 29-Apr-1933 Today's Date: 10/09/2019   History of Present Illness   Erik Moss is a 85 y.o. male past medical history of diabetes, CKD, hyperlipidemia, hypertension, who presents with difficulty walking, especially moving the left leg. MRI examination of the brain-small area of restricted diffusion in the right lateral thalamus/posterior limb of internal capsule.  Clinical Impression  Pt admitted with above. Pt presents with decreased functional mobility secondary to balance impairments, gait abnormalities, and decreased cognition. Pt ambulating 200 feet with no assistive device at a min assist level; noted left knee hyperextension during mid stance and circumduction. Negotiated 5 steps with bilateral railings. Pt displays higher level cognitive deficits including decreased awareness of safety/deficits, decreased attention, and ability to follow multi step commands. Recommended use of walker for all mobility and would benefit from OPPT to address deficits and maximize functional independence.     Follow Up Recommendations Outpatient PT;Supervision/Assistance - 24 hour (neuro)    Equipment Recommendations  Rolling walker with 5" wheels    Recommendations for Other Services       Precautions / Restrictions Precautions Precautions: Fall Restrictions Weight Bearing Restrictions: No      Mobility  Bed Mobility Overal bed mobility: Modified Independent                Transfers Overall transfer level: Needs assistance Equipment used: None Transfers: Sit to/from Stand Sit to Stand: Min guard            Ambulation/Gait Ambulation/Gait assistance: Min assist Gait Distance (Feet): 200 Feet Assistive device: None Gait Pattern/deviations: Step-through pattern;Decreased stance time - left;Decreased dorsiflexion - left Gait velocity: decreased   General Gait Details: Pt with noted  left knee hyperextension and occasional LLE circumduction. Requiring minA for stability with one episode of lateral LOB initially  Stairs Stairs: Yes Stairs assistance: Min assist Stair Management: Two rails Number of Stairs: 5 General stair comments: Cues for step by step pattern; pt utilizing more of a sideways technique with bilateral hand support  Wheelchair Mobility    Modified Rankin (Stroke Patients Only) Modified Rankin (Stroke Patients Only) Pre-Morbid Rankin Score: No symptoms Modified Rankin: Moderately severe disability     Balance Overall balance assessment: Needs assistance Sitting-balance support: Feet supported Sitting balance-Leahy Scale: Good     Standing balance support: No upper extremity supported;During functional activity Standing balance-Leahy Scale: Fair                               Pertinent Vitals/Pain Pain Assessment: Faces Faces Pain Scale: No hurt    Home Living Family/patient expects to be discharged to:: Private residence Living Arrangements: Children;Other relatives(daughter, granddaughter) Available Help at Discharge: Family Type of Home: House Home Access: Stairs to enter   Technical brewer of Steps: 4 Home Layout: Two level Home Equipment: None      Prior Function Level of Independence: Independent         Comments: Working on 10th Master's degree in Social Work; enjoys walking     Journalist, newspaper        Extremity/Trunk Assessment   Upper Extremity Assessment Upper Extremity Assessment: Defer to OT evaluation    Lower Extremity Assessment Lower Extremity Assessment: RLE deficits/detail;LLE deficits/detail RLE Deficits / Details: Strength 5/5 LLE Deficits / Details: Strength 5/5       Communication   Communication: No difficulties  Cognition Arousal/Alertness: Awake/alert Behavior During Therapy: Impulsive  Overall Cognitive Status: Impaired/Different from baseline Area of Impairment:  Attention;Memory;Following commands;Safety/judgement                   Current Attention Level: Selective;Sustained Memory: Decreased short-term memory Following Commands: Follows multi-step commands inconsistently Safety/Judgement: Decreased awareness of safety;Decreased awareness of deficits     General Comments: Pt mildly impulsive, needs cues for safety for controlling momentum and gait speed during ambulation. Pt with several errors counting backwards from 3's, 0/3 short term memroy recall (seems to be more of a component of attention to directions rather than memory capability), and decreased awareness of safety/deficits. Pt denying residual deficits and when asked how the walk went, pt stating, "good;" although significant dynamic balance deficits were noted by PT. Benefits from repetition for multi step commands.      General Comments      Exercises     Assessment/Plan    PT Assessment Patient needs continued PT services  PT Problem List Decreased strength;Decreased balance;Decreased coordination;Decreased mobility;Decreased cognition;Decreased safety awareness       PT Treatment Interventions DME instruction;Gait training;Stair training;Functional mobility training;Therapeutic activities;Therapeutic exercise;Balance training;Patient/family education    PT Goals (Current goals can be found in the Care Plan section)  Acute Rehab PT Goals Patient Stated Goal: "walk more." PT Goal Formulation: With patient Time For Goal Achievement: 10/23/19 Potential to Achieve Goals: Good    Frequency Min 4X/week   Barriers to discharge        Co-evaluation               AM-PAC PT "6 Clicks" Mobility  Outcome Measure Help needed turning from your back to your side while in a flat bed without using bedrails?: None Help needed moving from lying on your back to sitting on the side of a flat bed without using bedrails?: None Help needed moving to and from a bed to a chair  (including a wheelchair)?: A Little Help needed standing up from a chair using your arms (e.g., wheelchair or bedside chair)?: A Little Help needed to walk in hospital room?: A Little Help needed climbing 3-5 steps with a railing? : A Little 6 Click Score: 20    End of Session Equipment Utilized During Treatment: Gait belt Activity Tolerance: Patient tolerated treatment well Patient left: in bed;with call bell/phone within reach;with bed alarm set Nurse Communication: Mobility status PT Visit Diagnosis: Unsteadiness on feet (R26.81);Other abnormalities of gait and mobility (R26.89);Difficulty in walking, not elsewhere classified (R26.2);Other symptoms and signs involving the nervous system [id number])    Time: [id number] PT Time Calculation (min) (ACUTE ONLY): 21 min   Charges:   PT Evaluation $PT Eval Moderate Complexity: 1 Mod          Ellamae Sia, [place], DPT Acute Rehabilitation Services Pager [phone] Office [phone]   Willy Eddy 10/09/2019, 8:52 AM

## 2019-10-10 ENCOUNTER — Inpatient Hospital Stay (HOSPITAL_COMMUNITY): Payer: Medicare HMO

## 2019-10-10 ENCOUNTER — Encounter (HOSPITAL_COMMUNITY)
Admission: EM | Disposition: A | Discharge status: Home / Self Care | Payer: Self-pay | Source: Home / Self Care | Attending: Internal Medicine

## 2019-10-10 ENCOUNTER — Encounter (HOSPITAL_COMMUNITY): Payer: Self-pay | Admitting: Internal Medicine

## 2019-10-10 DIAGNOSIS — I340 Nonrheumatic mitral (valve) insufficiency: Secondary | ICD-10-CM

## 2019-10-10 HISTORY — PX: TEE WITHOUT CARDIOVERSION: SHX5443

## 2019-10-10 HISTORY — PX: BUBBLE STUDY: SHX6837

## 2019-10-10 LAB — BASIC METABOLIC PANEL
Anion gap: 8 (ref 5–15)
BUN: 29 mg/dL — ABNORMAL HIGH (ref 8–23)
CO2: 23 mmol/L (ref 22–32)
Calcium: 8.9 mg/dL (ref 8.9–10.3)
Chloride: 105 mmol/L (ref 98–111)
Creatinine, Ser: 1.78 mg/dL — ABNORMAL HIGH (ref 0.61–1.24)
GFR calc Af Amer: 40 mL/min — ABNORMAL LOW (ref >60)
GFR calc non Af Amer: 34 mL/min — ABNORMAL LOW (ref >60)
Glucose, Bld: 109 mg/dL — ABNORMAL HIGH (ref 70–99)
Potassium: 4.5 mmol/L (ref 3.5–5.1)
Sodium: 136 mmol/L (ref 135–145)

## 2019-10-10 LAB — LIPID PANEL
Cholesterol: 175 mg/dL (ref 0–200)
HDL: 48 mg/dL (ref >40)
LDL Cholesterol: 97 mg/dL (ref 0–99)
Total CHOL/HDL Ratio: 3.6 RATIO
Triglycerides: 151 mg/dL — ABNORMAL HIGH (ref <150)
VLDL: 30 mg/dL (ref 0–40)

## 2019-10-10 LAB — GLUCOSE, CAPILLARY
Glucose-Capillary: 98 mg/dL (ref 70–99)
Glucose-Capillary: 90 mg/dL (ref 70–99)
Glucose-Capillary: 100 mg/dL — ABNORMAL HIGH (ref 70–99)

## 2019-10-10 LAB — MAGNESIUM: Magnesium: 1.8 mg/dL (ref 1.7–2.4)

## 2019-10-10 SURGERY — ECHOCARDIOGRAM, TRANSESOPHAGEAL
Anesthesia: Moderate Sedation

## 2019-10-10 MED ORDER — DIPHENHYDRAMINE HCL 50 MG/ML IJ SOLN
INTRAMUSCULAR | Status: AC
  Filled 2019-10-10: qty 1

## 2019-10-10 MED ORDER — ASPIRIN 81 MG PO TBEC: 81.0000 mg | DELAYED_RELEASE_TABLET | Freq: Every day | ORAL | 0 refills | Status: AC

## 2019-10-10 MED ORDER — AMLODIPINE BESYLATE 5 MG PO TABS
5.0000 mg | ORAL_TABLET | Freq: Every day | ORAL | Status: DC
  Administered 2019-10-10: 5 mg via ORAL
  Filled 2019-10-10: qty 1

## 2019-10-10 MED ORDER — BUTAMBEN-TETRACAINE-BENZOCAINE 2-2-14 % EX AERO
INHALATION_SPRAY | CUTANEOUS | Status: DC | PRN
  Administered 2019-10-10: 2 via TOPICAL

## 2019-10-10 MED ORDER — FENTANYL CITRATE (PF) 100 MCG/2ML IJ SOLN
INTRAMUSCULAR | Status: DC | PRN
  Administered 2019-10-10 (×3): 25 ug via INTRAVENOUS

## 2019-10-10 MED ORDER — MIDAZOLAM HCL (PF) 10 MG/2ML IJ SOLN
INTRAMUSCULAR | Status: DC | PRN
  Administered 2019-10-10: 2 mg via INTRAVENOUS
  Administered 2019-10-10: 1 mg via INTRAVENOUS
  Administered 2019-10-10 (×2): 2 mg via INTRAVENOUS

## 2019-10-10 MED ORDER — SODIUM CHLORIDE 0.9 % IV SOLN: INTRAVENOUS | Status: DC

## 2019-10-10 MED ORDER — FENTANYL CITRATE (PF) 100 MCG/2ML IJ SOLN
INTRAMUSCULAR | Status: AC
  Filled 2019-10-10: qty 2

## 2019-10-10 MED ORDER — CLOPIDOGREL BISULFATE 75 MG PO TABS: 75.0000 mg | ORAL_TABLET | Freq: Every day | ORAL | 0 refills | Status: AC

## 2019-10-10 MED ORDER — ATORVASTATIN CALCIUM 40 MG PO TABS: 40.0000 mg | ORAL_TABLET | Freq: Every day | ORAL | 0 refills | Status: AC

## 2019-10-10 MED ORDER — MIDAZOLAM HCL (PF) 5 MG/ML IJ SOLN
INTRAMUSCULAR | Status: AC
  Filled 2019-10-10: qty 2

## 2019-10-10 NOTE — Discharge Summary (Signed)
Physician Discharge Summary  Erik Moss [id number] DOB: 1933/01/25 DOA: 10/08/2019  PCP: [name], FNP  Admit date: 10/08/2019 Discharge date: 10/10/2019  Admitted From: Home Disposition:  Home  Recommendations for Outpatient Follow-up:  1. Follow up with PCP in 1-2 weeks 2. Follow-up with [facility] in 4 weeks following discharge 3. Started on aspirin 81 mg p.o. daily 4. Started on Plavix 75 mg p.o. daily x21 days 5. Change simvastatin to atorvastatin 40 mg p.o. daily 6. Will need further titration of his diabetic regimen for goal A1c less than 7.0 7. Will need close follow-up with PCP/nephrology given his CKD stage IV 8. Please obtain BMP in one week  Home Health: Outpatient PT/OT Equipment/Devices: Rolling walker, 3 in 1 bedside commode  Discharge Condition: Stable CODE STATUS: Full code Diet recommendation: Heart healthy/consistent carbohydrate  History of present illness:  Erik Seniorsis a 85 y.o.malewith medical history significant ofBPH, type 2 diabetes, hyperlipidemia, hypertension, who is coming to the emergency department due to having difficulty walking as he felt that his left lower extremity was dragging. However, by the time that the patient came to the emergency department his symptoms have mostly resolved. He denies headache, blurred vision, nausea or vomiting. He denies fever, chills, sore throat, wheezing, hemoptysis, chest pain, palpitations, dizziness, diaphoresis, PND, orthopnea or pitting edema of the lower extremities. Denies abdominal pain, diarrhea, constipation, melena or hematochezia. No dysuria, frequency or hematuria. The patient has nocturia. He denies polyuria, polydipsia, polyphagia or blurred vision.  ED Course:His urinalysis shows small hemoglobinuria and rare bacteria on micro exam. UDS was negative. CBC was normal. Alcohol less than 10 mg/dL. CMP showed a glucose of 109, BUN of 31, creatinine  1.92 mg/dL. All other values are within normal limits.  Imaging: CT head showed mild chronic ischemic white matter disease but no intracranial normality was seen. However MRI of head without contrast showed a small region of acute infarction on the right affecting the lateral thalamus/posterior limb internal capsule. Please see images and full regular report for further detail.  Hospital course:  Acute ischemic stroke Patient presenting the ED with left lower extremity weakness.  CT head unrevealing other than mild chronic ischemic white matter disease.  MRI head without contrast small acute infarction right lateral thalamus/posterior limb internal capsule.  Neurology was consulted and followed during hospital course.  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  Right lower extremity weakness markedly improved since initial presentation.  PT recommends outpatient PT. MRA head and neck with no large vessel occlusion or other significant occlusive disease noted.  Underwent TEE for further evaluation of his aortic valve for concerns of vegetation versus fibroelastoma with findings of aortic root/valve calcification and this likely related to findings seen on TTE; no fibroelastoma and no vegetations noted.  Patient was started on aspirin 81 mg p.o. daily.  Plavix 75 mg p.o. daily, will continue for 21 days.  Atorvastatin 40 mg p.o. daily.  Will discharge home with outpatient PT/OT.  Follow-up with neurology in stroke clinic in 4 weeks following discharge.  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  Underwent TEE on 10/10/2019 with findings of aortic root/valve calcification no fibroelastoma and no vegetations noted, calcification likely what was seen on area of concern on TTE.  Type 2  diabetes mellitus Home regimen includes glimepiride 1 mg p.o. daily, Januvia 25 mg  p.o. daily.  Hemoglobin A1c 7.9. Continue glimepiride 1 mg p.o. daily, linagliptin 5 mg p.o. daily.  Will need close outpatient follow-up with PCP, goal hemoglobin A1c less than 7.0.  Hyperlipidemia Was previously on simvastatin 20 mg p.o. daily.  Lipid profile with triglycerides 124, total cholesterol 123, HDL 35, LDL 63.  Simvastatin changed to high intensity atorvastatin 40 mg p.o. daily  Essential hypertension Continue amlodipine 5 mg p.o. daily  BPH Continue tamsulosin 0.4 mg p.o. nightly  CKD stage IV Patient with previous hospitalization July 2020 with fatigue and was found to have renal failure with creatinine up to 3.02 and hyperkalemia with potassium of 6.1.  Recently moved from [place] to live with daughter.  Received Lokelma, bicarb, D50/insulin with improvement of his hyperkalemia during that admission.  Renal ultrasound with no hydronephrosis with increased echotexture consistent with medical renal disease; and bilateral renal cysts with one with septations.  Creatinine improved to 1.95 at time of discharge.  Creatinine on presentation this admission 1.92, likely his new baseline.  Creatinine improved from 1.92-1.79 at time of discharge.  Continue sodium bicarbonate 650 mg p.o. twice daily.  Recommend repeat BMP in 1 week.  Will need outpatient referral if not completed already for close outpatient follow-up in regards to his chronic kidney disease.  Discharge Diagnoses:  Principal Problem:   Acute CVA (cerebrovascular accident) ([place]) Active Problems:   Type 2 diabetes mellitus ([place])   Hyperlipidemia   BPH (benign prostatic hyperplasia)   Hypertension    Discharge Instructions  Discharge Instructions    Ambulatory referral to Occupational Therapy   Complete by: As directed    Ambulatory referral to Physical Therapy   Complete by: As directed    Call MD for:  difficulty breathing,  headache or visual disturbances   Complete by: As directed    Call MD for:  extreme fatigue   Complete by: As directed    Call MD for:  hives   Complete by: As directed    Call MD for:  persistant dizziness or light-headedness   Complete by: As directed    Call MD for:  persistant nausea and vomiting   Complete by: As directed    Call MD for:  severe uncontrolled pain   Complete by: As directed    Call MD for:  temperature >100.4   Complete by: As directed    Diet - low sodium heart healthy   Complete by: As directed    Increase activity slowly   Complete by: As directed      Allergies as of 10/10/2019   No Known Allergies     Medication List    STOP taking these medications   simvastatin 20 MG tablet Commonly known as: ZOCOR     TAKE these medications   amLODipine 5 MG tablet Commonly known as: NORVASC Take 5 mg by mouth daily.   aspirin 81 MG EC tablet Take 1 tablet (81 mg total) by mouth daily. Start taking on: October 11, 2019   atorvastatin 40 MG tablet Commonly known as: Lipitor Take 1 tablet (40 mg total) by mouth daily.   clopidogrel 75 MG tablet Commonly known as: PLAVIX Take 1 tablet (75 mg total) by mouth daily for 21 days. Start taking on: October 11, 2019   ferrous gluconate 324 MG tablet Commonly known as: FERGON Take 648 mg by mouth daily.   glimepiride 1 MG tablet Commonly known as: AMARYL Take 1 mg by mouth daily with breakfast.   multivitamin  with minerals Tabs tablet Take 1 tablet by mouth daily.   sitaGLIPtin 25 MG tablet Commonly known as: JANUVIA Take 25 mg by mouth daily.   sodium bicarbonate 650 MG tablet Take 650 mg by mouth 2 (two) times daily.   tamsulosin 0.4 MG Caps capsule Commonly known as: FLOMAX Take 0.4 mg by mouth at bedtime.   triamcinolone ointment 0.1 % Commonly known as: KENALOG Apply 1 application topically daily as needed (itching on lower legs.).            Durable Medical Equipment  (From  admission, onward)         Start     Ordered   10/09/19 1319  For home use only DME 3 n 1  Once     10/09/19 1318   10/09/19 1219  For home use only DME Walker rolling  Once    Question:  Patient needs a walker to treat with the following condition  Answer:  Gait disorder   10/09/19 1219   10/09/19 1109  For home use only DME Walker rolling  Once    Question:  Patient needs a walker to treat with the following condition  Answer:  Stroke [facility])   10/09/19 1108         Follow-up Information    [place] [facility].   Specialty: Rehabilitation Why: The outpatient rehab will contact you for the first appointment Contact information: [place], [place], [place]. Schedule an appointment as soon as possible for a visit in 1 week(s).   Specialty: Family Medicine Contact information: [place] [address] [facility]. Schedule an appointment as soon as possible for a visit in 4 week(s).   Specialty: Neurology Contact information: [address] [employer] [phone]         No Known Allergies  Consultations:  Neurology   Procedures/Studies: CT HEAD WO CONTRAST  Result Date: 10/08/2019 CLINICAL DATA:  Difficulty walking. EXAM: CT HEAD WITHOUT CONTRAST TECHNIQUE: Contiguous axial images were obtained from the base of the skull through the vertex without intravenous contrast. COMPARISON:  May 08, 2019. FINDINGS: Brain: Mild chronic ischemic white matter disease is noted. No mass effect or midline shift is noted. Ventricular size is within normal limits. There is no evidence of mass lesion, hemorrhage or acute infarction. Vascular: No hyperdense vessel or unexpected calcification. Skull: Normal. Negative for fracture or focal lesion. Sinuses/Orbits: No  acute finding. Other: None. IMPRESSION: Mild chronic ischemic white matter disease. No acute intracranial abnormality seen. Electronically Signed   By: Marijo Conception M.D.   On: 10/08/2019 10:48   MR ANGIO HEAD WO CONTRAST  Result Date: 10/09/2019 CLINICAL DATA:  Stroke EXAM: MRA HEAD WITHOUT CONTRAST MRA NECK WITHOUT CONTRAST TECHNIQUE: Angiographic images of the Circle of Willis were obtained using MRA technique without intravenous contrast. Angiographic images of the neck were obtained using MRA technique without intravenous contrast. Carotid stenosis measurements (when applicable) are obtained utilizing NASCET criteria, using the distal internal carotid diameter as the denominator. COMPARISON:  None. FINDINGS: MRA HEAD FINDINGS Intracranial internal carotid arteries are patent with atherosclerotic irregularity and calcified plaque causing mild stenosis. Middle and anterior cerebral arteries are patent. Intracranial vertebral arteries, basilar artery, posterior cerebral arteries are patent. There is no significant stenosis or aneurysm. MRA NECK FINDINGS The  included common carotid arteries, internal carotid arteries, and external carotid arteries are patent. There is mild atherosclerotic irregularity at the ICA origins without hemodynamically significant stenosis. The included extracranial vertebral arteries are patent. The right vertebral artery is dominant. IMPRESSION: No large vessel occlusion or significant stenosis. Electronically Signed   By: Macy Mis M.D.   On: 10/09/2019 14:54   MR ANGIO NECK WO CONTRAST  Result Date: 10/09/2019 CLINICAL DATA:  Stroke EXAM: MRA HEAD WITHOUT CONTRAST MRA NECK WITHOUT CONTRAST TECHNIQUE: Angiographic images of the Circle of Willis were obtained using MRA technique without intravenous contrast. Angiographic images of the neck were obtained using MRA technique without intravenous contrast. Carotid stenosis measurements (when applicable) are obtained utilizing  NASCET criteria, using the distal internal carotid diameter as the denominator. COMPARISON:  None. FINDINGS: MRA HEAD FINDINGS Intracranial internal carotid arteries are patent with atherosclerotic irregularity and calcified plaque causing mild stenosis. Middle and anterior cerebral arteries are patent. Intracranial vertebral arteries, basilar artery, posterior cerebral arteries are patent. There is no significant stenosis or aneurysm. MRA NECK FINDINGS The included common carotid arteries, internal carotid arteries, and external carotid arteries are patent. There is mild atherosclerotic irregularity at the ICA origins without hemodynamically significant stenosis. The included extracranial vertebral arteries are patent. The right vertebral artery is dominant. IMPRESSION: No large vessel occlusion or significant stenosis. Electronically Signed   By: Macy Mis M.D.   On: 10/09/2019 14:54   MR BRAIN WO CONTRAST  Result Date: 10/08/2019 CLINICAL DATA:  Awoke today with weakness in the left leg. EXAM: MRI HEAD WITHOUT CONTRAST TECHNIQUE: Multiplanar, multiecho pulse sequences of the brain and surrounding structures were obtained without intravenous contrast. COMPARISON:  CT same day.  CT 05/08/2019. FINDINGS: Brain: Diffusion imaging shows a small region of restricted diffusion along the right lateral thalamus/posterior limb internal capsule. This probably explains the clinical presentation. No other acute or subacute infarction. Elsewhere, brainstem shows chronic small-vessel ischemic changes of the pons. No focal cerebellar insult. Cerebral hemispheres show mild chronic small-vessel change of the white matter for a person of this age. No large vessel territory infarction. No mass lesion, hemorrhage, hydrocephalus or extra-axial collection. Vascular: Major vessels at the base of the brain show flow. Skull and upper cervical spine: Negative Sinuses/Orbits: Sinuses are clear except for opacification of a  posterior ethmoid air cell on the right. Orbits are negative. Other: None IMPRESSION: Small region of acute infarction on the right affecting the lateral thalamus/posterior limb internal capsule. Age related chronic small-vessel ischemic changes elsewhere throughout the brain as above. Electronically Signed   By: Nelson Chimes M.D.   On: 10/08/2019 14:46   ECHOCARDIOGRAM COMPLETE  Result Date: 10/09/2019   ECHOCARDIOGRAM REPORT   Patient Name:   Erik Moss Date of Exam: 10/09/2019 Medical Rec #:  [id number]        Height:       66.0 in Accession #:    [id number]       Weight:       144.6 lb Date of Birth:  Feb 06, 1933        BSA:          1.74 m Patient Age:    27 years         BP:           145/99 mmHg Patient Gender: M                HR:           89 bpm.  Exam Location:  Inpatient Procedure: 2D Echo Indications:    Stroke 434.91 / I163.9  History:        Patient has no prior history of Echocardiogram examinations.                 Stroke; Risk Factors:Diabetes, Dyslipidemia, Hypertension and                 Non-Smoker. Acute Renal Failure.  Sonographer:    Leavy Cella Referring Phys: [id number] DAVID MANUEL Macy  1. There is a small mass (0.6 cm x 0.6 cm) that appears to be attached to the [place] of the AoV. In certain views it appears to be focal calcification, but there does appear to be a mobile component to it in the basal SAX views. This could represent a papillary fibroelastoma vs vegetation. Would recommend a TEE for better characterization, especially in this patient with concerns for stroke.  2. Left ventricular ejection fraction, by visual estimation, is 65 to 70%. The left ventricle has normal function. There is no left ventricular hypertrophy.  3. Global right ventricle has normal systolic function.The right ventricular size is normal. No increase in right ventricular wall thickness.  4. Presence of pericardial fat pad.  5. Small pericardial effusion.  6. The pericardial effusion is  circumferential.  7. Mild mitral annular calcification.  8. The mitral valve is grossly normal. Trivial mitral valve regurgitation.  9. The tricuspid valve is grossly normal. Tricuspid valve regurgitation is trivial. 10. The aortic valve is tricuspid. Aortic valve regurgitation is not visualized. No evidence of aortic valve sclerosis or stenosis. 11. Normal pulmonary artery systolic pressure. 12. The inferior vena cava is normal in size with greater than 50% respiratory variability, suggesting right atrial pressure of 3 mmHg. 13. The pulmonic valve was grossly normal. Pulmonic valve regurgitation is trivial. 14. The left ventricle has no regional wall motion abnormalities. 15. Indeterminate diastolic filling due to E-A fusion. 16. Left atrial size was normal. 17. Right atrial size was normal. 18. The tricuspid regurgitant velocity is 2.17 m/s, and with an assumed right atrial pressure of 3 mmHg, the estimated right ventricular systolic pressure is normal at 21.8 mmHg. FINDINGS  Left Ventricle: Left ventricular ejection fraction, by visual estimation, is 65 to 70%. The left ventricle has normal function. The left ventricle has no regional wall motion abnormalities. There is no left ventricular hypertrophy. Indeterminate diastolic filling due to E-A fusion. Right Ventricle: The right ventricular size is normal. No increase in right ventricular wall thickness. Global RV systolic function is has normal systolic function. The tricuspid regurgitant velocity is 2.17 m/s, and with an assumed right atrial pressure  of 3 mmHg, the estimated right ventricular systolic pressure is normal at 21.8 mmHg. Left Atrium: Left atrial size was normal in size. Right Atrium: Right atrial size was normal in size Pericardium: A small pericardial effusion is present. The pericardial effusion is circumferential. Presence of pericardial fat pad. Mitral Valve: The mitral valve is grossly normal. Mild mitral annular calcification. Trivial mitral  valve regurgitation. Tricuspid Valve: The tricuspid valve is grossly normal. Tricuspid valve regurgitation is trivial. Aortic Valve: The aortic valve is tricuspid. Aortic valve regurgitation is not visualized. The aortic valve is structurally normal, with no evidence of sclerosis or stenosis. There is a small mass (0.6 cm x 0.6 cm) that appears to be attached to the [place] of the AoV. In certain views it appears to be focal calcification, but there does appear to be a mobile  component to it in the basal SAX views. This could represent a papillary fibroelastoma vs vegetation. Would recommend a TEE for better characterization, especially in this patient with concerns for stroke. Pulmonic Valve: The pulmonic valve was grossly normal. Pulmonic valve regurgitation is trivial. Pulmonic regurgitation is trivial. Aorta: The aortic root is normal in size and structure. Venous: The inferior vena cava is normal in size with greater than 50% respiratory variability, suggesting right atrial pressure of 3 mmHg. IAS/Shunts: No atrial level shunt detected by color flow Doppler.  LEFT VENTRICLE PLAX 2D LVIDd:         2.70 cm  Diastology LVIDs:         2.00 cm  LV e' lateral:   7.72 cm/s LV PW:         1.30 cm  LV E/e' lateral: 8.8 LV IVS:        1.10 cm  LV e' medial:    5.77 cm/s LVOT diam:     1.80 cm  LV E/e' medial:  11.8 LV SV:         14 ml LV SV Index:   8.15 LVOT Area:     2.54 cm  RIGHT VENTRICLE RV S prime:     6.34 cm/s TAPSE (M-mode): 2.4 cm LEFT ATRIUM             Index       RIGHT ATRIUM           Index LA diam:        3.30 cm 1.89 cm/m  RA Area:     12.70 cm LA Vol (A2C):   36.6 ml 21.01 ml/m RA Volume:   26.90 ml  15.44 ml/m LA Vol (A4C):   33.1 ml 19.00 ml/m LA Biplane Vol: 36.0 ml 20.66 ml/m   AORTA Ao Root diam: 2.90 cm MITRAL VALVE                         TRICUSPID VALVE MV Area (PHT): 5.27 cm              TR Peak grad:   18.8 mmHg MV PHT:        41.76 msec            TR Vmax:        217.00 cm/s MV Decel  Time: 144 msec MV E velocity: 68.30 cm/s  103 cm/s  SHUNTS MV A velocity: 101.00 cm/s 70.3 cm/s Systemic Diam: 1.80 cm MV E/A ratio:  0.68        1.5  Eleonore Chiquito MD Electronically signed by Eleonore Chiquito MD Signature Date/Time: 10/09/2019/10:49:27 AM    Final    ECHO TEE  Result Date: 10/10/2019   TRANSESOPHOGEAL ECHO REPORT   Patient Name:   Erik Moss Date of Exam: 10/10/2019 Medical Rec #:  [id number]        Height:       66.0 in Accession #:    [id number]       Weight:       144.6 lb Date of Birth:  Jul 17, 1933        BSA:          1.74 m Patient Age:    67 years         BP:           97/57 mmHg Patient Gender: M  HR:           61 bpm. Exam Location:  Inpatient  Procedure: Transesophageal Echo, Cardiac Doppler and Color Doppler Indications:     CVA  History:         Patient has prior history of Echocardiogram examinations, most                  recent 10/09/2019.  Sonographer:     Dustin Flock Referring Phys:  [place] Diagnosing Phys: Eleonore Chiquito MD  PROCEDURE: Normal Transesophogeal exam. After discussion of the risks and benefits of a TEE, an informed consent was obtained from the patient. The transesophogeal probe was passed through the esophogus of the patient. Image quality was excellent. Supplementary images were obtained from transthoracic windows as indicated to answer the clinical question. The patient developed no complications during the procedure. IMPRESSIONS  1. There is protruding calcified atheroma in the aortic root (best seen image 42). It is mass-like and measures 0.7 cm x 0.5 cm. This was likely seen in and out of plane on transthoracic imaging around the aortic valve. There is no mass or vegetation attached to the aortic valve. This represents severe (Grade 4) atheroma in the aortic root. There is moderate (Grade 3) atheroma in the descending aorta.  2. Left ventricular ejection fraction, by visual estimation, is 60 to 65%. The left ventricle  has normal function. There is no left ventricular hypertrophy.  3. Global right ventricle has normal systolic function.The right ventricular size is normal. No increase in right ventricular wall thickness.  4. Left atrial size was normal.  5. No LAA thrombus. LAA emptying velocity 91 cm/s.  6. The mitral valve is grossly normal. Mild mitral valve regurgitation.  7. The tricuspid valve is grossly normal. Tricuspid valve regurgitation is mild.  8. The aortic valve is tricuspid. Aortic valve regurgitation is not visualized. No evidence of aortic valve sclerosis or stenosis.  9. The pulmonic valve was grossly normal. Pulmonic valve regurgitation is trivial. 10. Severe plaque invoving the aortic root. 11. IAS septum is hypermobile but doesn't meet criteria for aneurysm. 12. Right atrial size was normal. 13. The left ventricle has no regional wall motion abnormalities. 14. Trivial pericardial effusion is present. FINDINGS  Left Ventricle: Left ventricular ejection fraction, by visual estimation, is 60 to 65%. The left ventricle has normal function. The left ventricle has no regional wall motion abnormalities. There is no left ventricular hypertrophy. Right Ventricle: The right ventricular size is normal. No increase in right ventricular wall thickness. Global RV systolic function is has normal systolic function. Left Atrium: Left atrial size was normal in size. No LAA thrombus. LAA emptying velocity 91 cm/s. Right Atrium: Right atrial size was normal in size Pericardium: Trivial pericardial effusion is present. Mitral Valve: The mitral valve is grossly normal. Mild mitral valve regurgitation. There is no evidence of mitral valve vegetation. Tricuspid Valve: The tricuspid valve is grossly normal. Tricuspid valve regurgitation is mild. Aortic Valve: The aortic valve is tricuspid. Aortic valve regurgitation is not visualized. The aortic valve is structurally normal, with no evidence of sclerosis or stenosis. Pulmonic Valve:  The pulmonic valve was grossly normal. Pulmonic valve regurgitation is trivial. Aorta: The aortic root and ascending aorta are structurally normal, with no evidence of dilitation. There is severe, protruding plaque involving the aortic root. There is protruding calcified atheroma in the aortic root (best seen image 42). It is mass-like and measures 0.7 cm x 0.5 cm. This was likely  seen in and out of plane on transthoracic imaging around the aortic valve. There is no mass or vegetation attached to the aortic valve. This represents severe (Grade 4) atheroma in the aortic root. There is moderate (Grade 3) atheroma in the descending aorta. Venous: The left upper pulmonary vein is and the right upper pulmonary vein is normal. Shunts: Saline contrast bubble study was negative, with no evidence of any interatrial shunt. IAS septum is hypermobile but doesn't meet criteria for aneurysm. No atrial level shunt detected by color flow Doppler.   AORTA                 Normals Ao Root diam: 3.40 cm 31 mm Ao STJ diam:  2.6 cm Ao Asc diam:  3.00 cm 31 mm  Eleonore Chiquito MD Electronically signed by Eleonore Chiquito MD Signature Date/Time: 10/10/2019/12:30:01 PM    Final       Subjective:   Discharge Exam: Vitals:   10/10/19 1231 10/10/19 1250  BP: 127/66 (!) 148/70  Pulse:  (!) 56  Resp:  18  Temp:  (!) 97.5 F (36.4 C)  SpO2:  99%   Vitals:   10/10/19 1206 10/10/19 1217 10/10/19 1231 10/10/19 1250  BP: (!) 106/46 (!) 125/59 127/66 (!) 148/70  Pulse: (!) 56 (!) 55  (!) 56  Resp: 12 10  18   Temp:    (!) 97.5 F (36.4 C)  TempSrc:    Oral  SpO2: 96% 98%  99%  Weight:      Height:        General: Pt is alert, awake, not in acute distress Cardiovascular: RRR, S1/S2 +, no rubs, no gallops Respiratory: CTA bilaterally, no wheezing, no rhonchi Abdominal: Soft, NT, ND, bowel sounds + Extremities: no edema, no cyanosis    The results of significant diagnostics from this hospitalization (including imaging,  microbiology, ancillary and laboratory) are listed below for reference.     Microbiology: Recent Results (from the past 240 hour(s))  SARS CORONAVIRUS 2 (TAT 6-24 HRS) Nasopharyngeal Nasopharyngeal Swab     Status: None   Collection Time: 10/08/19  5:19 PM   Specimen: Nasopharyngeal Swab  Result Value Ref Range Status   SARS Coronavirus 2 NEGATIVE NEGATIVE Final    Comment: (NOTE) SARS-CoV-2 target nucleic acids are NOT DETECTED. The SARS-CoV-2 RNA is generally detectable in upper and lower respiratory specimens during the acute phase of infection. Negative results do not preclude SARS-CoV-2 infection, do not rule out co-infections with other pathogens, and should not be used as the sole basis for treatment or other patient management decisions. Negative results must be combined with clinical observations, patient history, and epidemiological information. The expected result is Negative. Fact Sheet for Patients: SugarRoll.be Fact Sheet for Healthcare Providers: https://www.woods-mathews.com/ This test is not yet approved or cleared by the [place] FDA and  has been authorized for detection and/or diagnosis of SARS-CoV-2 by FDA under an Emergency Use Authorization (EUA). This EUA will remain  in effect (meaning this test can be used) for the duration of the COVID-19 declaration under Section 56 4(b)(1) of the Act, 21 U.S.C. section 360bbb-3(b)(1), unless the authorization is terminated or revoked sooner. Performed at [facility], [place] [address] [place], [place] [address]   Culture, blood (routine x 2)     Status: None (Preliminary result)   Collection Time: 10/09/19 11:54 AM   Specimen: BLOOD RIGHT HAND  Result Value Ref Range Status   Specimen Description BLOOD RIGHT HAND  Final  Special Requests   Final    BOTTLES DRAWN AEROBIC ONLY Blood Culture adequate volume   Culture   Final    NO GROWTH < 24 HOURS Performed at  [facility], [place] [address] [place], [place] [address]    Report Status PENDING  Incomplete  Culture, blood (routine x 2)     Status: None (Preliminary result)   Collection Time: 10/09/19 11:54 AM   Specimen: BLOOD LEFT HAND  Result Value Ref Range Status   Specimen Description BLOOD LEFT HAND  Final   Special Requests   Final    BOTTLES DRAWN AEROBIC ONLY Blood Culture adequate volume   Culture   Final    NO GROWTH < 24 HOURS Performed at [facility], [place] [address], [place], [place] [address]    Report Status PENDING  Incomplete     Labs: BNP (last 3 results) No results for input(s): BNP in the last 8760 hours. Basic Metabolic Panel: Recent Labs  Lab 10/08/19 0829 10/10/19 0215  NA 139 136  K 4.1 4.5  CL 106 105  CO2 23 23  GLUCOSE 109* 109*  BUN 31* 29*  CREATININE 1.92* 1.78*  CALCIUM 9.5 8.9  MG  --  1.8   Liver Function Tests: Recent Labs  Lab 10/08/19 0829  AST 18  ALT 16  ALKPHOS 60  BILITOT 0.7  PROT 7.5  ALBUMIN 4.3   No results for input(s): LIPASE, AMYLASE in the last 168 hours. No results for input(s): AMMONIA in the last 168 hours. CBC: Recent Labs  Lab 10/08/19 0829  WBC 6.3  NEUTROABS 4.0  HGB 13.0  HCT 40.5  MCV 97.8  PLT 270   Cardiac Enzymes: No results for input(s): CKTOTAL, CKMB, CKMBINDEX, TROPONINI in the last 168 hours. BNP: Invalid input(s): POCBNP CBG: Recent Labs  Lab 10/09/19 1135 10/09/19 1607 10/09/19 2133 10/10/19 0607 10/10/19 1307  GLUCAP 152* 154* 123* 98 100*   D-Dimer No results for input(s): DDIMER in the last 72 hours. Hgb A1c Recent Labs    10/08/19 0829  HGBA1C 7.9*   Lipid Profile Recent Labs    10/10/19 0215  CHOL 175  HDL 48  LDLCALC 97  TRIG 151*  CHOLHDL 3.6   Thyroid function studies No results for input(s): TSH, T4TOTAL, T3FREE, THYROIDAB in the last 72 hours.  Invalid input(s): FREET3 Anemia work up No results for input(s): VITAMINB12, FOLATE, FERRITIN, TIBC,  IRON, RETICCTPCT in the last 72 hours. Urinalysis    Component Value Date/Time   COLORURINE YELLOW 10/08/2019 0830   APPEARANCEUR CLEAR 10/08/2019 0830   LABSPEC 1.013 10/08/2019 0830   PHURINE 5.0 10/08/2019 0830   GLUCOSEU NEGATIVE 10/08/2019 0830   HGBUR SMALL (A) 10/08/2019 0830   BILIRUBINUR NEGATIVE 10/08/2019 0830   KETONESUR NEGATIVE 10/08/2019 0830   PROTEINUR NEGATIVE 10/08/2019 0830   NITRITE NEGATIVE 10/08/2019 0830   LEUKOCYTESUR NEGATIVE 10/08/2019 0830   Sepsis Labs Invalid input(s): PROCALCITONIN,  WBC,  LACTICIDVEN Microbiology Recent Results (from the past 240 hour(s))  SARS CORONAVIRUS 2 (TAT 6-24 HRS) Nasopharyngeal Nasopharyngeal Swab     Status: None   Collection Time: 10/08/19  5:19 PM   Specimen: Nasopharyngeal Swab  Result Value Ref Range Status   SARS Coronavirus 2 NEGATIVE NEGATIVE Final    Comment: (NOTE) SARS-CoV-2 target nucleic acids are NOT DETECTED. The SARS-CoV-2 RNA is generally detectable in upper and lower respiratory specimens during the acute phase of infection. Negative results do not preclude SARS-CoV-2 infection,  do not rule out co-infections with other pathogens, and should not be used as the sole basis for treatment or other patient management decisions. Negative results must be combined with clinical observations, patient history, and epidemiological information. The expected result is Negative. Fact Sheet for Patients: SugarRoll.be Fact Sheet for Healthcare Providers: https://www.woods-mathews.com/ This test is not yet approved or cleared by the [place] FDA and  has been authorized for detection and/or diagnosis of SARS-CoV-2 by FDA under an Emergency Use Authorization (EUA). This EUA will remain  in effect (meaning this test can be used) for the duration of the COVID-19 declaration under Section 56 4(b)(1) of the Act, 21 U.S.C. section 360bbb-3(b)(1), unless the authorization is  terminated or revoked sooner. Performed at [facility], [place] [address] [place], [place] [address]   Culture, blood (routine x 2)     Status: None (Preliminary result)   Collection Time: 10/09/19 11:54 AM   Specimen: BLOOD RIGHT HAND  Result Value Ref Range Status   Specimen Description BLOOD RIGHT HAND  Final   Special Requests   Final    BOTTLES DRAWN AEROBIC ONLY Blood Culture adequate volume   Culture   Final    NO GROWTH < 24 HOURS Performed at [facility], [place] [address], [place], [place] [address]    Report Status PENDING  Incomplete  Culture, blood (routine x 2)     Status: None (Preliminary result)   Collection Time: 10/09/19 11:54 AM   Specimen: BLOOD LEFT HAND  Result Value Ref Range Status   Specimen Description BLOOD LEFT HAND  Final   Special Requests   Final    BOTTLES DRAWN AEROBIC ONLY Blood Culture adequate volume   Culture   Final    NO GROWTH < 24 HOURS Performed at [facility], [place] [address] [place], [place] [address]    Report Status PENDING  Incomplete     Time coordinating discharge: Over 30 minutes  SIGNED:   Donnamarie Poag [place] DO  [facility] 10/10/2019, 1:31 PM

## 2019-10-10 NOTE — Progress Notes (Signed)
SLP Cancellation Note  Patient Details Name: Erik Moss MRN: [id number] DOB: [birth year]   Cancelled treatment:        Patient off floor for TEE. Will see at next scheduled time available.   Wynelle Bourgeois , MA, CCC-SLP 10/10/2019, 10:03 AM

## 2019-10-10 NOTE — CV Procedure (Signed)
    TRANSESOPHAGEAL ECHOCARDIOGRAM   NAME:  Erik Moss    MRN: [id number] DOB:  [birth year]    ADMIT DATE: 10/08/2019  INDICATIONS: AoV mass   PROCEDURE:   Informed consent was obtained prior to the procedure. The risks, benefits and alternatives for the procedure were discussed and the patient comprehended these risks.  Risks include, but are not limited to, cough, sore throat, vomiting, nausea, somnolence, esophageal and stomach trauma or perforation, bleeding, low blood pressure, aspiration, pneumonia, infection, trauma to the teeth and death.    Procedural time out performed. The oropharynx was anesthetized with topical 1% benzocaine.    During this procedure the patient is administered a total of Versed 7 mg and Fentanyl 75 mcg to achieve and maintain moderate conscious sedation.  The patient's heart rate, blood pressure, and oxygen saturation are monitored continuously during the procedure. The period of conscious sedation is 22 minutes, of which I was present face-to-face 100% of this time.   The transesophageal probe was inserted in the esophagus and stomach without difficulty and multiple views were obtained.   COMPLICATIONS:    There were no immediate complications.  KEY FINDINGS: 1. Aortic root/valve calcification. Suspect this was what was seen on TTE. No PFE and no vegetation seen. 2. Grade III aortic atheroma. 3. Normal LVEF 60%.   [name] T. Audie Box, [place]  [address], [place], [place] [address] [phone]  11:25 AM

## 2019-10-10 NOTE — Progress Notes (Signed)
Occupational Therapy Treatment Patient Details Name: Francico Gaylord MRN: [id number] DOB: 01/08/1933 Today's Date: 10/10/2019    History of present illness  Kyzen Priest is a 85 y.o. male past medical history of diabetes, CKD, hyperlipidemia, hypertension, who presents with difficulty walking, especially moving the left leg. MRI examination of the brain-small area of restricted diffusion in the right lateral thalamus/posterior limb of internal capsule.   OT comments  Pt making steady progress towards OT goals this session. Overall, pt requires min guard- min a for functional mobility/ transfers. Pt requires cues for safety as pt noted to decline use of RW and observed to impulsively stand up from EOB.  Pt noted to "furniture walk" throughout room but preferring to use a "walking stick at home". Practiced simulated tub transfer with pt needing MINA for balance. Pt noted to hold on to sink during transfer. Encouraged use of grab bars at home.  Visually demo'ed use of 3n1 as shower seat with pt verbalizing understanding. Feel pt can DC home if  24 hour assist is available as pt continues to be impulsive and presents with poor safety awareness. Will continue to follow acutely per POC and update DC recs as needed.    Follow Up Recommendations  Outpatient OT;Supervision/Assistance - 24 hour;Other (comment)(neuro)    Equipment Recommendations  3 in 1 bedside commode;Other (comment)(as shower seat)    Recommendations for Other Services      Precautions / Restrictions Precautions Precautions: Fall Restrictions Weight Bearing Restrictions: No       Mobility Bed Mobility Overal bed mobility: Modified Independent                Transfers Overall transfer level: Needs assistance Equipment used: Rolling walker (2 wheeled) Transfers: Sit to/from Stand Sit to Stand: Modified independent (Device/Increase time)         General transfer comment: Pt stood impulsively from bed with no  AD    Balance Overall balance assessment: Needs assistance Sitting-balance support: Feet supported Sitting balance-Leahy Scale: Good Sitting balance - Comments: able to reach to feet to pull up socks   Standing balance support: No upper extremity supported;During functional activity Standing balance-Leahy Scale: Fair Standing balance comment: close min guard when washing face at sink                           ADL either performed or assessed with clinical judgement   ADL Overall ADL's : Needs assistance/impaired     Grooming: Wash/dry face;Standing;Min guard               Lower Body Dressing: Min guard;Sitting/lateral leans Lower Body Dressing Details (indicate cue type and reason): to pull up socks from EOB Toilet Transfer: Min guard;Ambulation;Regular Glass blower/designer Details (indicate cue type and reason): pt declined use of RW needing close min guard for safety to<>from bathroom Toileting- Clothing Manipulation and Hygiene: Supervision/safety;Sit to/from stand   Tub/ Shower Transfer: Minimal assistance;Ambulation;Rolling walker;3 in 1 Tub/Shower Transfer Details (indicate cue type and reason): simulated tub transfer in room via stepping over trashcan. Pt noted to hold on to sink while stepping over. visully demo'ed use of 3n1 as shower seat in tub shower. pt verbalized understandng but insisten on not needing to sit to shower Functional mobility during ADLs: Min guard;Minimal assistance;Rolling walker General ADL Comments: Pt with cognitive and balance deficits noted. Pt very impulsive throughout session and not wanting to use RW, would rather use "walking stick." educated pt on increased  safety with using RW vs. walking stick. Session focus on standing grooming, tub transfer and toilet transfer/ hygiene. Overall, pt requires min A - min guard for functional mobility and transfers needing cues for safety throughout     Vision Baseline Vision/History: Wears  glasses Wears Glasses: At all times     Perception     Praxis      Cognition Arousal/Alertness: Awake/alert Behavior During Therapy: Impulsive Overall Cognitive Status: Impaired/Different from baseline Area of Impairment: Attention;Memory;Following commands;Safety/judgement;Awareness;Problem solving                   Current Attention Level: Selective;Sustained Memory: Decreased recall of precautions;Decreased short-term memory Following Commands: Follows multi-step commands inconsistently Safety/Judgement: Decreased awareness of safety;Decreased awareness of deficits Awareness: Emergent Problem Solving: Slow processing;Requires verbal cues General Comments: noted to perseverate on what to do with equipment if he doesn't need it. asked therapist same questions 3x. Pt with poor insight into deficits noted by pt not wanting to use RW even though pt aware that he furniture walks throughout room.        Exercises     Shoulder Instructions       General Comments      Pertinent Vitals/ Pain       Pain Assessment: No/denies pain  Home Living                                          Prior Functioning/Environment              Frequency  Min 2X/week        Progress Toward Goals  OT Goals(current goals can now be found in the care plan section)  Progress towards OT goals: Progressing toward goals  Acute Rehab OT Goals Patient Stated Goal: "walk more." "finish classes" OT Goal Formulation: With patient Time For Goal Achievement: 10/23/19 Potential to Achieve Goals: Good  Plan Discharge plan remains appropriate    Co-evaluation                 AM-PAC OT "6 Clicks" Daily Activity     Outcome Measure   Help from another person eating meals?: None Help from another person taking care of personal grooming?: A Little Help from another person toileting, which includes using toliet, bedpan, or urinal?: A Little Help from another  person bathing (including washing, rinsing, drying)?: A Little Help from another person to put on and taking off regular upper body clothing?: None Help from another person to put on and taking off regular lower body clothing?: A Little 6 Click Score: 20    End of Session    OT Visit Diagnosis: Unsteadiness on feet (R26.81);Muscle weakness (generalized) (M62.81);Other abnormalities of gait and mobility (R26.89);Other symptoms and signs involving cognitive function   Activity Tolerance Patient tolerated treatment well   Patient Left Other (comment)(handed pt off to transport for procedure)   Nurse Communication Other (comment)(handed pt off to transport)        Time: [id number] OT Time Calculation (min): 14 min  Charges: OT General Charges $OT Visit: 1 Visit OT Treatments $Self Care/Home Management : 8-22 mins  Lanier Clam., COTA/L Acute Rehabilitation Services [phone]    Ihor Gully 10/10/2019, 11:36 AM

## 2019-10-10 NOTE — Progress Notes (Signed)
*  PRELIMINARY RESULTS* Echocardiogram Echocardiogram Transesophageal has been performed.  Erik Moss 10/10/2019, 11:41 AM

## 2019-10-10 NOTE — Progress Notes (Signed)
PROGRESS NOTE    Erik Moss  [id number] DOB: [birth year] DOA: 10/08/2019 PCP: [name], FNP    Brief Narrative:   Erik Moss is a 85 y.o. male with medical history significant of BPH, type 2 diabetes, hyperlipidemia, hypertension, who is coming to the emergency department due to having difficulty walking as he felt that his left lower extremity was dragging.  However, by the time that the patient came to the emergency department his symptoms have mostly resolved.  He denies headache, blurred vision, nausea or vomiting.  He denies fever, chills, sore throat, wheezing, hemoptysis, chest pain, palpitations, dizziness, diaphoresis, PND, orthopnea or pitting edema of the lower extremities.  Denies abdominal pain, diarrhea, constipation, melena or hematochezia.  No dysuria, frequency or hematuria.  The patient has nocturia.  He denies polyuria, polydipsia, polyphagia or blurred vision.  ED Course: His urinalysis shows small hemoglobinuria and rare bacteria on micro exam.  UDS was negative.  CBC was normal.  Alcohol less than 10 mg/dL.  CMP showed a glucose of 109, BUN of 31, creatinine 1.92 mg/dL.  All other values are within normal limits.  Imaging: CT head showed mild chronic ischemic white matter disease but no intracranial normality was seen.  However MRI of head without contrast showed a small region of acute infarction on the right affecting the lateral thalamus/posterior limb internal capsule.  Please see images and full regular report for further detail.   Assessment & Plan:   Principal Problem:   Acute CVA (cerebrovascular accident) [facility]) Active Problems:   Type 2 diabetes mellitus ([place])   Hyperlipidemia   BPH (benign prostatic hyperplasia)   Hypertension   Acute ischemic stroke Patient presenting the ED with left lower extremity weakness.  CT head unrevealing other than mild chronic ischemic white matter disease.  MRI head without contrast small acute  infarction right lateral thalamus/posterior limb internal capsule. 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  Right lower extremity weakness markedly improved since initial presentation.  PT recommends outpatient PT. MRA head and neck with no large vessel occlusion or other significant occlusive disease noted.  Underwent TEE for further evaluation of his aortic valve for concerns of vegetation versus fibroblastoma with findings of aortic root/valve calcification and this likely related to findings seen on TTE; no PFT and no vegetations noted. --Neurology following, appreciate assistance --Pending OT/speech therapy evaluation --Aspirin 81 mg p.o. daily --Plavix 75 mg p.o. daily --Atorvastatin 80 mg p.o. 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  Underwent TEE on 10/10/2019 with findings of aortic root/valve calcification no fibroblastoma no vegetations noted, calcification likely what was seen on area of concern on TTE.  Type 2 diabetes mellitus Home regimen includes glimepiride 1 mg p.o. daily, Januvia 25 mg p.o. daily.  Hemoglobin A1c 7.9. --Continue glimepiride 1 mg p.o. daily, linagliptin 5 mg p.o. daily --Monitor CBGs before every meal at bedtime  Hyperlipidemia Was previously on simvastatin 20 mg p.o. daily.  Lipid profile with triglycerides 124, total cholesterol 123, HDL 35, LDL 63. --changed to high intensity atorvastatin 80 mg p.o. daily  Essential hypertension BP 142/71, fairly well controlled.  On amlodipine 5mg  PO daily at home. --Resume home amlodipine 5 mg p.o. daily today  BPH --Continue tamsulosin 0.4 mg p.o. nightly --Strict I's and O's --Monitor renal function  daily  CKD stage IV Patient with previous hospitalization July 2020 with fatigue and was found to have renal failure with creatinine up to 3.02 and hyperkalemia with potassium of 6.1.  Recently moved from [place] to live with daughter.  Received Lokelma, bicarb, D50/insulin with improvement of his hyperkalemia during that admission.  Renal ultrasound with no hydronephrosis with increased echotexture consistent with medical renal disease; and bilateral renal cysts with one with septations.  Creatinine improved to 1.95 at time of discharge.  Creatinine on presentation this admission 1.92, likely his new baseline. --Cr 1.92-->1.79 today --Continue to monitor renal function daily --Sodium bicarbonate 650 mg p.o. twice daily --Avoid nephrotoxins, renally dose all medications --Outpatient follow-up with nephrology   DVT prophylaxis: Heparin Code Status: Full code Family Communication: None present at bedside Disposition Plan: Continue inpatient, awaiting ST/OT evaluation, likely discharge home when medically ready with outpatient PT follow-up.   Consultants:   Neurology  Cardiology for TEE  Procedures:   Transthoracic echocardiogram: IMPRESSIONS    1. There is a small mass (0.6 cm x 0.6 cm) that appears to be attached to the [place] of the AoV. In certain views it appears to be focal calcification, but there does appear to be a mobile component to it in the basal SAX views. This could represent a  papillary fibroelastoma vs vegetation. Would recommend a TEE for better characterization, especially in this patient with concerns for stroke.  2. Left ventricular ejection fraction, by visual estimation, is 65 to 70%. The left ventricle has normal function. There is no left ventricular hypertrophy.  3. Global right ventricle has normal systolic function.The right ventricular size is normal. No increase in right ventricular wall thickness.  4. Presence of pericardial fat pad.  5. Small  pericardial effusion.  6. The pericardial effusion is circumferential.  7. Mild mitral annular calcification.  8. The mitral valve is grossly normal. Trivial mitral valve regurgitation.  9. The tricuspid valve is grossly normal. Tricuspid valve regurgitation is trivial. 10. The aortic valve is tricuspid. Aortic valve regurgitation is not visualized. No evidence of aortic valve sclerosis or stenosis. 11. Normal pulmonary artery systolic pressure. 12. The inferior vena cava is normal in size with greater than 50% respiratory variability, suggesting right atrial pressure of 3 mmHg. 13. The pulmonic valve was grossly normal. Pulmonic valve regurgitation is trivial. 14. The left ventricle has no regional wall motion abnormalities. 15. Indeterminate diastolic filling due to E-A fusion. 16. Left atrial size was normal. 17. Right atrial size was normal. 18. The tricuspid regurgitant velocity is 2.17 m/s, and with an assumed right atrial pressure of 3 mmHg, the estimated right ventricular systolic pressure is normal at 21.8 mmHg.  Transesophageal echocardiogram 10/10/2019: KEY FINDINGS: 1. Aortic root/valve calcification. Suspect this was what was seen on TTE. No PFE and no vegetation seen. 2. Grade III aortic atheroma. 3. Normal LVEF 60%.    Antimicrobials:   none   Subjective: Patient seen and examined bedside, resting comfortably.  Awaiting for TEE later this morning. No other specific complaints.  Denies headache, no chest pain, palpitations, no fever/chills/night sweats, no nausea/vomiting/diarrhea, no shortness of breath, no abdominal pain, no fatigue, no paresthesias.  No acute events overnight per nursing staff.  Objective: Vitals:   10/10/19 1110 10/10/19 1115 10/10/19 1120 10/10/19 1125  BP: (!) 120/42 (!) 104/39 (!) 102/43 (!) 98/39  Pulse: 68 68 64 66  Resp:  (!) 21 18 12   Temp:      TempSrc:  SpO2: 100% 100% 100% 100%  Weight:      Height:        Intake/Output  Summary (Last 24 hours) at 10/10/2019 1133 Last data filed at 10/10/2019 0823 Gross per 24 hour  Intake --  Output 1100 ml  Net -1100 ml   Filed Weights   10/08/19 2130  Weight: 65.6 kg    Examination:  General exam: Appears calm and comfortable  Respiratory system: Clear to auscultation. Respiratory effort normal. Cardiovascular system: S1 & S2 heard, RRR. No JVD, murmurs, rubs, gallops or clicks. No pedal edema. Gastrointestinal system: Abdomen is nondistended, soft and nontender. No organomegaly or masses felt. Normal bowel sounds heard. Central nervous system: Alert and oriented. No focal neurological deficits. Extremities: Moves all extremities independently, very slight asymmetric weakness left lower extremity. Skin: No rashes, lesions or ulcers Psychiatry: Judgement and insight appear normal. Mood & affect appropriate.     Data Reviewed: I have personally reviewed following labs and imaging studies  CBC: Recent Labs  Lab 10/08/19 0829  WBC 6.3  NEUTROABS 4.0  HGB 13.0  HCT 40.5  MCV 97.8  PLT [id number]   Basic Metabolic Panel: Recent Labs  Lab 10/08/19 0829 10/10/19 0215  NA 139 136  K 4.1 4.5  CL 106 105  CO2 23 23  GLUCOSE 109* 109*  BUN 31* 29*  CREATININE 1.92* 1.78*  CALCIUM 9.5 8.9  MG  --  1.8   GFR: Estimated Creatinine Clearance: 27.9 mL/min (A) (by C-G formula based on SCr of 1.78 mg/dL (H)). Liver Function Tests: Recent Labs  Lab 10/08/19 0829  AST 18  ALT 16  ALKPHOS 60  BILITOT 0.7  PROT 7.5  ALBUMIN 4.3   No results for input(s): LIPASE, AMYLASE in the last 168 hours. No results for input(s): AMMONIA in the last 168 hours. Coagulation Profile: Recent Labs  Lab 10/08/19 0829  INR 0.9   Cardiac Enzymes: No results for input(s): CKTOTAL, CKMB, CKMBINDEX, TROPONINI in the last 168 hours. BNP (last 3 results) No results for input(s): PROBNP in the last 8760 hours. HbA1C: Recent Labs    10/08/19 0829  HGBA1C 7.9*    CBG: Recent Labs  Lab 10/09/19 0554 10/09/19 1135 10/09/19 1607 10/09/19 2133 10/10/19 0607  GLUCAP 82 152* 154* 123* 98   Lipid Profile: Recent Labs    10/10/19 0215  CHOL 175  HDL 48  LDLCALC 97  TRIG 151*  CHOLHDL 3.6   Thyroid Function Tests: No results for input(s): TSH, T4TOTAL, FREET4, T3FREE, THYROIDAB in the last 72 hours. Anemia Panel: No results for input(s): VITAMINB12, FOLATE, FERRITIN, TIBC, IRON, RETICCTPCT in the last 72 hours. Sepsis Labs: No results for input(s): PROCALCITON, LATICACIDVEN in the last 168 hours.  Recent Results (from the past 240 hour(s))  SARS CORONAVIRUS 2 (TAT 6-24 HRS) Nasopharyngeal Nasopharyngeal Swab     Status: None   Collection Time: 10/08/19  5:19 PM   Specimen: Nasopharyngeal Swab  Result Value Ref Range Status   SARS Coronavirus 2 NEGATIVE NEGATIVE Final    Comment: (NOTE) SARS-CoV-2 target nucleic acids are NOT DETECTED. The SARS-CoV-2 RNA is generally detectable in upper and lower respiratory specimens during the acute phase of infection. Negative results do not preclude SARS-CoV-2 infection, do not rule out co-infections with other pathogens, and should not be used as the sole basis for treatment or other patient management decisions. Negative results must be combined with clinical observations, patient history, and epidemiological information. The expected result is Negative. Fact Sheet  for Patients: SugarRoll.be Fact Sheet for Healthcare Providers: https://www.woods-mathews.com/ This test is not yet approved or cleared by the [place] FDA and  has been authorized for detection and/or diagnosis of SARS-CoV-2 by FDA under an Emergency Use Authorization (EUA). This EUA will remain  in effect (meaning this test can be used) for the duration of the COVID-19 declaration under Section 56 4(b)(1) of the Act, 21 U.S.C. section 360bbb-3(b)(1), unless the authorization is  terminated or revoked sooner. Performed at [facility], [place] [address], [place], [place] [address]          Radiology Studies: MR ANGIO HEAD WO CONTRAST  Result Date: 10/09/2019 CLINICAL DATA:  Stroke EXAM: MRA HEAD WITHOUT CONTRAST MRA NECK WITHOUT CONTRAST TECHNIQUE: Angiographic images of the Circle of Willis were obtained using MRA technique without intravenous contrast. Angiographic images of the neck were obtained using MRA technique without intravenous contrast. Carotid stenosis measurements (when applicable) are obtained utilizing NASCET criteria, using the distal internal carotid diameter as the denominator. COMPARISON:  None. FINDINGS: MRA HEAD FINDINGS Intracranial internal carotid arteries are patent with atherosclerotic irregularity and calcified plaque causing mild stenosis. Middle and anterior cerebral arteries are patent. Intracranial vertebral arteries, basilar artery, posterior cerebral arteries are patent. There is no significant stenosis or aneurysm. MRA NECK FINDINGS The included common carotid arteries, internal carotid arteries, and external carotid arteries are patent. There is mild atherosclerotic irregularity at the ICA origins without hemodynamically significant stenosis. The included extracranial vertebral arteries are patent. The right vertebral artery is dominant. IMPRESSION: No large vessel occlusion or significant stenosis. Electronically Signed   By: Macy Mis M.D.   On: 10/09/2019 14:54   MR ANGIO NECK WO CONTRAST  Result Date: 10/09/2019 CLINICAL DATA:  Stroke EXAM: MRA HEAD WITHOUT CONTRAST MRA NECK WITHOUT CONTRAST TECHNIQUE: Angiographic images of the Circle of Willis were obtained using MRA technique without intravenous contrast. Angiographic images of the neck were obtained using MRA technique without intravenous contrast. Carotid stenosis measurements (when applicable) are obtained utilizing NASCET criteria, using the distal internal carotid  diameter as the denominator. COMPARISON:  None. FINDINGS: MRA HEAD FINDINGS Intracranial internal carotid arteries are patent with atherosclerotic irregularity and calcified plaque causing mild stenosis. Middle and anterior cerebral arteries are patent. Intracranial vertebral arteries, basilar artery, posterior cerebral arteries are patent. There is no significant stenosis or aneurysm. MRA NECK FINDINGS The included common carotid arteries, internal carotid arteries, and external carotid arteries are patent. There is mild atherosclerotic irregularity at the ICA origins without hemodynamically significant stenosis. The included extracranial vertebral arteries are patent. The right vertebral artery is dominant. IMPRESSION: No large vessel occlusion or significant stenosis. Electronically Signed   By: Macy Mis M.D.   On: 10/09/2019 14:54   MR BRAIN WO CONTRAST  Result Date: 10/08/2019 CLINICAL DATA:  Awoke today with weakness in the left leg. EXAM: MRI HEAD WITHOUT CONTRAST TECHNIQUE: Multiplanar, multiecho pulse sequences of the brain and surrounding structures were obtained without intravenous contrast. COMPARISON:  CT same day.  CT 05/08/2019. FINDINGS: Brain: Diffusion imaging shows a small region of restricted diffusion along the right lateral thalamus/posterior limb internal capsule. This probably explains the clinical presentation. No other acute or subacute infarction. Elsewhere, brainstem shows chronic small-vessel ischemic changes of the pons. No focal cerebellar insult. Cerebral hemispheres show mild chronic small-vessel change of the white matter for a person of this age. No large vessel territory infarction. No mass lesion, hemorrhage, hydrocephalus or extra-axial collection. Vascular: Major vessels at the  base of the brain show flow. Skull and upper cervical spine: Negative Sinuses/Orbits: Sinuses are clear except for opacification of a posterior ethmoid air cell on the right. Orbits are  negative. Other: None IMPRESSION: Small region of acute infarction on the right affecting the lateral thalamus/posterior limb internal capsule. Age related chronic small-vessel ischemic changes elsewhere throughout the brain as above. Electronically Signed   By: Nelson Chimes M.D.   On: 10/08/2019 14:46   ECHOCARDIOGRAM COMPLETE  Result Date: 10/09/2019   ECHOCARDIOGRAM REPORT   Patient Name:   BLANDON WATRY Date of Exam: 10/09/2019 Medical Rec #:  [id number]        Height:       66.0 in Accession #:    [id number]       Weight:       144.6 lb Date of Birth:  1933/05/18        BSA:          1.74 m Patient Age:    73 years         BP:           145/99 mmHg Patient Gender: M                HR:           89 bpm. Exam Location:  Inpatient Procedure: 2D Echo Indications:    Stroke 434.91 / I163.9  History:        Patient has no prior history of Echocardiogram examinations.                 Stroke; Risk Factors:Diabetes, Dyslipidemia, Hypertension and                 Non-Smoker. Acute Renal Failure.  Sonographer:    Leavy Cella Referring Phys: [id number] DAVID MANUEL Rotan  1. There is a small mass (0.6 cm x 0.6 cm) that appears to be attached to the [place] of the AoV. In certain views it appears to be focal calcification, but there does appear to be a mobile component to it in the basal SAX views. This could represent a papillary fibroelastoma vs vegetation. Would recommend a TEE for better characterization, especially in this patient with concerns for stroke.  2. Left ventricular ejection fraction, by visual estimation, is 65 to 70%. The left ventricle has normal function. There is no left ventricular hypertrophy.  3. Global right ventricle has normal systolic function.The right ventricular size is normal. No increase in right ventricular wall thickness.  4. Presence of pericardial fat pad.  5. Small pericardial effusion.  6. The pericardial effusion is circumferential.  7. Mild mitral annular  calcification.  8. The mitral valve is grossly normal. Trivial mitral valve regurgitation.  9. The tricuspid valve is grossly normal. Tricuspid valve regurgitation is trivial. 10. The aortic valve is tricuspid. Aortic valve regurgitation is not visualized. No evidence of aortic valve sclerosis or stenosis. 11. Normal pulmonary artery systolic pressure. 12. The inferior vena cava is normal in size with greater than 50% respiratory variability, suggesting right atrial pressure of 3 mmHg. 13. The pulmonic valve was grossly normal. Pulmonic valve regurgitation is trivial. 14. The left ventricle has no regional wall motion abnormalities. 15. Indeterminate diastolic filling due to E-A fusion. 16. Left atrial size was normal. 17. Right atrial size was normal. 18. The tricuspid regurgitant velocity is 2.17 m/s, and with an assumed right atrial pressure of 3 mmHg, the estimated right ventricular systolic pressure is normal at 21.8 mmHg.  FINDINGS  Left Ventricle: Left ventricular ejection fraction, by visual estimation, is 65 to 70%. The left ventricle has normal function. The left ventricle has no regional wall motion abnormalities. There is no left ventricular hypertrophy. Indeterminate diastolic filling due to E-A fusion. Right Ventricle: The right ventricular size is normal. No increase in right ventricular wall thickness. Global RV systolic function is has normal systolic function. The tricuspid regurgitant velocity is 2.17 m/s, and with an assumed right atrial pressure  of 3 mmHg, the estimated right ventricular systolic pressure is normal at 21.8 mmHg. Left Atrium: Left atrial size was normal in size. Right Atrium: Right atrial size was normal in size Pericardium: A small pericardial effusion is present. The pericardial effusion is circumferential. Presence of pericardial fat pad. Mitral Valve: The mitral valve is grossly normal. Mild mitral annular calcification. Trivial mitral valve regurgitation. Tricuspid Valve:  The tricuspid valve is grossly normal. Tricuspid valve regurgitation is trivial. Aortic Valve: The aortic valve is tricuspid. Aortic valve regurgitation is not visualized. The aortic valve is structurally normal, with no evidence of sclerosis or stenosis. There is a small mass (0.6 cm x 0.6 cm) that appears to be attached to the [place] of the AoV. In certain views it appears to be focal calcification, but there does appear to be a mobile component to it in the basal SAX views. This could represent a papillary fibroelastoma vs vegetation. Would recommend a TEE for better characterization, especially in this patient with concerns for stroke. Pulmonic Valve: The pulmonic valve was grossly normal. Pulmonic valve regurgitation is trivial. Pulmonic regurgitation is trivial. Aorta: The aortic root is normal in size and structure. Venous: The inferior vena cava is normal in size with greater than 50% respiratory variability, suggesting right atrial pressure of 3 mmHg. IAS/Shunts: No atrial level shunt detected by color flow Doppler.  LEFT VENTRICLE PLAX 2D LVIDd:         2.70 cm  Diastology LVIDs:         2.00 cm  LV e' lateral:   7.72 cm/s LV PW:         1.30 cm  LV E/e' lateral: 8.8 LV IVS:        1.10 cm  LV e' medial:    5.77 cm/s LVOT diam:     1.80 cm  LV E/e' medial:  11.8 LV SV:         14 ml LV SV Index:   8.15 LVOT Area:     2.54 cm  RIGHT VENTRICLE RV S prime:     6.34 cm/s TAPSE (M-mode): 2.4 cm LEFT ATRIUM             Index       RIGHT ATRIUM           Index LA diam:        3.30 cm 1.89 cm/m  RA Area:     12.70 cm LA Vol (A2C):   36.6 ml 21.01 ml/m RA Volume:   26.90 ml  15.44 ml/m LA Vol (A4C):   33.1 ml 19.00 ml/m LA Biplane Vol: 36.0 ml 20.66 ml/m   AORTA Ao Root diam: 2.90 cm MITRAL VALVE                         TRICUSPID VALVE MV Area (PHT): 5.27 cm              TR Peak grad:   18.8 mmHg MV PHT:  41.76 msec            TR Vmax:        217.00 cm/s MV Decel Time: 144 msec MV E velocity: 68.30 cm/s   103 cm/s  SHUNTS MV A velocity: 101.00 cm/s 70.3 cm/s Systemic Diam: 1.80 cm MV E/A ratio:  0.68        1.5  Eleonore Chiquito MD Electronically signed by Eleonore Chiquito MD Signature Date/Time: 10/09/2019/10:49:27 AM    Final         Scheduled Meds: . [MAR Hold]  stroke: mapping our early stages of recovery book   Does not apply Once  . [MAR Hold] aspirin EC  81 mg Oral Daily  . [MAR Hold] atorvastatin  80 mg Oral q1800  . [MAR Hold] clopidogrel  75 mg Oral Daily  . [MAR Hold] ferrous gluconate  648 mg Oral Daily  . [MAR Hold] glimepiride  1 mg Oral Q breakfast  . [MAR Hold] linagliptin  5 mg Oral Daily  . [MAR Hold] sodium bicarbonate  650 mg Oral BID  . [MAR Hold] tamsulosin  0.4 mg Oral QHS   Continuous Infusions: . sodium chloride 20 mL/hr at 10/10/19 1016     LOS: 2 days    Time spent: 35 minutes spent on chart review, discussion with nursing staff, consultants, updating family and interview/physical exam; more than 50% of that time was spent in counseling and/or coordination of care.    Abie Killian J [place] DO [facility] 10/10/2019, 11:33 AM

## 2019-10-10 NOTE — Progress Notes (Signed)
Physical Therapy Treatment Patient Details Name: Erik Moss MRN: [id number] DOB: 07/23/1933 Today's Date: 10/10/2019    History of Present Illness  Erik Moss is a 85 y.o. male past medical history of diabetes, CKD, hyperlipidemia, hypertension, who presents with difficulty walking, especially moving the left leg. MRI examination of the brain-small area of restricted diffusion in the right lateral thalamus/posterior limb of internal capsule.    PT Comments    Pt performed gt training and functional mobility.  He was adamant that he did not need device but after further assessment and presentation of poor balance and multiple LOB, RW implemented and balance much improved.  Educated on using RW and assistance from daughter when up mobilizing.  He reports he is going to follow up at outpatient therapy.    Follow Up Recommendations  Outpatient PT;Supervision/Assistance - 24 hour     Equipment Recommendations  Rolling walker with 5" wheels    Recommendations for Other Services       Precautions / Restrictions Precautions Precautions: Fall Restrictions Weight Bearing Restrictions: No    Mobility  Bed Mobility Overal bed mobility: Modified Independent                Transfers Overall transfer level: Needs assistance Equipment used: Rolling walker (2 wheeled) Transfers: Sit to/from Stand Sit to Stand: Supervision         General transfer comment: Pt tried to stand from chair pulling on RW he required cues for hand placement to improve ease.  Ambulation/Gait Ambulation/Gait assistance: Min assist;Min guard Gait Distance (Feet): 250 Feet(+ 85 ft additonal ( with RW)) Assistive device: None;Rolling walker (2 wheeled) Gait Pattern/deviations: Step-through pattern;Decreased stance time - left;Decreased dorsiflexion - left;Trunk flexed     General Gait Details: Pt with poor flexed posture and multiple LOB in standing during ambulation.  He required cues for  reciprocal armswing and slowing pace.  Pt performed additional trial withoutn device and performed better but would stilll benefit from assistance to maintain safety.   Stairs Stairs: Yes Stairs assistance: Min guard Stair Management: Two rails Number of Stairs: 16 General stair comments: Cues for sequencing and placing entire foot on stair to improve safety.  He required assistance to slow down and make sure of each step.  Her presents with depth perception impairments and had difficulty finding the next stair to negotiate to.   Wheelchair Mobility    Modified Rankin (Stroke Patients Only) Modified Rankin (Stroke Patients Only) Pre-Morbid Rankin Score: No symptoms Modified Rankin: Moderately severe disability     Balance Overall balance assessment: Needs assistance Sitting-balance support: Feet supported Sitting balance-Leahy Scale: Good Sitting balance - Comments: able to reach to feet to pull up socks   Standing balance support: No upper extremity supported;During functional activity Standing balance-Leahy Scale: Poor Standing balance comment: close min guard when washing face at sink                            Cognition Arousal/Alertness: Awake/alert Behavior During Therapy: Impulsive Overall Cognitive Status: Impaired/Different from baseline Area of Impairment: Attention;Memory;Following commands;Safety/judgement;Awareness;Problem solving                   Current Attention Level: Selective;Sustained Memory: Decreased recall of precautions;Decreased short-term memory Following Commands: Follows multi-step commands inconsistently Safety/Judgement: Decreased awareness of safety;Decreased awareness of deficits Awareness: Emergent Problem Solving: Slow processing;Requires verbal cues General Comments: Pt very impulsive and required extensive education on utilizing equipment and assistance from  his daughter until balance and function improves.       Exercises      General Comments        Pertinent Vitals/Pain Pain Assessment: Faces Faces Pain Scale: No hurt    Home Living                      Prior Function            PT Goals (current goals can now be found in the care plan section) Acute Rehab PT Goals Patient Stated Goal: "walk more." "finish classes" Potential to Achieve Goals: Good Progress towards PT goals: Progressing toward goals    Frequency    Min 4X/week      PT Plan Current plan remains appropriate    Co-evaluation              AM-PAC PT "6 Clicks" Mobility   Outcome Measure  Help needed turning from your back to your side while in a flat bed without using bedrails?: None Help needed moving from lying on your back to sitting on the side of a flat bed without using bedrails?: None Help needed moving to and from a bed to a chair (including a wheelchair)?: A Little Help needed standing up from a chair using your arms (e.g., wheelchair or bedside chair)?: A Little Help needed to walk in hospital room?: A Little Help needed climbing 3-5 steps with a railing? : A Little 6 Click Score: 20    End of Session Equipment Utilized During Treatment: Gait belt Activity Tolerance: Patient tolerated treatment well Patient left: in bed;with call bell/phone within reach;with bed alarm set Nurse Communication: Mobility status PT Visit Diagnosis: Unsteadiness on feet (R26.81);Other abnormalities of gait and mobility (R26.89);Difficulty in walking, not elsewhere classified (R26.2);Other symptoms and signs involving the nervous system (R29.898)     Time: [id number] PT Time Calculation (min) (ACUTE ONLY): 24 min  Charges:  $Gait Training: 8-22 mins $Therapeutic Activity: 8-22 mins                     Erasmo Leventhal , PTA Acute Rehabilitation Services Pager [phone] Office [phone]     Lenora Gomes Eli Hose 10/10/2019, 2:58 PM

## 2019-10-10 NOTE — Progress Notes (Signed)
STROKE TEAM PROGRESS NOTE   INTERVAL HISTORY Pt sitting in bed, RN at bedside. Pt tolerated TEE well. No vegetation or mass seen, likely calcification seen on TTE yesterday.  Patient eager to go home today.  Vitals:   10/10/19 1206 10/10/19 1217 10/10/19 1231 10/10/19 1250  BP: (!) 106/46 (!) 125/59 127/66 (!) 148/70  Pulse: (!) 56 (!) 55  (!) 56  Resp: 12 10  18   Temp:    (!) 97.5 F (36.4 C)  TempSrc:    Oral  SpO2: 96% 98%  99%  Weight:      Height:        CBC:  Recent Labs  Lab 10/08/19 0829  WBC 6.3  NEUTROABS 4.0  HGB 13.0  HCT 40.5  MCV 97.8  PLT [id number]    Basic Metabolic Panel:  Recent Labs  Lab 10/08/19 0829 10/10/19 0215  NA 139 136  K 4.1 4.5  CL 106 105  CO2 23 23  GLUCOSE 109* 109*  BUN 31* 29*  CREATININE 1.92* 1.78*  CALCIUM 9.5 8.9  MG  --  1.8   Lipid Panel:     Component Value Date/Time   CHOL 175 10/10/2019 0215   TRIG 151 (H) 10/10/2019 0215   HDL 48 10/10/2019 0215   CHOLHDL 3.6 10/10/2019 0215   VLDL 30 10/10/2019 0215   LDLCALC 97 10/10/2019 0215   HgbA1c:  Lab Results  Component Value Date   HGBA1C 7.9 (H) 10/08/2019   Urine Drug Screen:     Component Value Date/Time   LABOPIA NONE DETECTED 10/08/2019 0830   COCAINSCRNUR NONE DETECTED 10/08/2019 0830   LABBENZ NONE DETECTED 10/08/2019 0830   AMPHETMU NONE DETECTED 10/08/2019 0830   THCU NONE DETECTED 10/08/2019 0830   LABBARB NONE DETECTED 10/08/2019 0830    Alcohol Level     Component Value Date/Time   ETH <10 10/08/2019 0829    IMAGING MR ANGIO HEAD WO CONTRAST  Result Date: 10/09/2019 CLINICAL DATA:  Stroke EXAM: MRA HEAD WITHOUT CONTRAST MRA NECK WITHOUT CONTRAST TECHNIQUE: Angiographic images of the Circle of Willis were obtained using MRA technique without intravenous contrast. Angiographic images of the neck were obtained using MRA technique without intravenous contrast. Carotid stenosis measurements (when applicable) are obtained utilizing NASCET criteria,  using the distal internal carotid diameter as the denominator. COMPARISON:  None. FINDINGS: MRA HEAD FINDINGS Intracranial internal carotid arteries are patent with atherosclerotic irregularity and calcified plaque causing mild stenosis. Middle and anterior cerebral arteries are patent. Intracranial vertebral arteries, basilar artery, posterior cerebral arteries are patent. There is no significant stenosis or aneurysm. MRA NECK FINDINGS The included common carotid arteries, internal carotid arteries, and external carotid arteries are patent. There is mild atherosclerotic irregularity at the ICA origins without hemodynamically significant stenosis. The included extracranial vertebral arteries are patent. The right vertebral artery is dominant. IMPRESSION: No large vessel occlusion or significant stenosis. Electronically Signed   By: Macy Mis M.D.   On: 10/09/2019 14:54   MR ANGIO NECK WO CONTRAST  Result Date: 10/09/2019 CLINICAL DATA:  Stroke EXAM: MRA HEAD WITHOUT CONTRAST MRA NECK WITHOUT CONTRAST TECHNIQUE: Angiographic images of the Circle of Willis were obtained using MRA technique without intravenous contrast. Angiographic images of the neck were obtained using MRA technique without intravenous contrast. Carotid stenosis measurements (when applicable) are obtained utilizing NASCET criteria, using the distal internal carotid diameter as the denominator. COMPARISON:  None. FINDINGS: MRA HEAD FINDINGS Intracranial internal carotid arteries are patent with atherosclerotic irregularity and  calcified plaque causing mild stenosis. Middle and anterior cerebral arteries are patent. Intracranial vertebral arteries, basilar artery, posterior cerebral arteries are patent. There is no significant stenosis or aneurysm. MRA NECK FINDINGS The included common carotid arteries, internal carotid arteries, and external carotid arteries are patent. There is mild atherosclerotic irregularity at the ICA origins without  hemodynamically significant stenosis. The included extracranial vertebral arteries are patent. The right vertebral artery is dominant. IMPRESSION: No large vessel occlusion or significant stenosis. Electronically Signed   By: Macy Mis M.D.   On: 10/09/2019 14:54   MR BRAIN WO CONTRAST  Result Date: 10/08/2019 CLINICAL DATA:  Awoke today with weakness in the left leg. EXAM: MRI HEAD WITHOUT CONTRAST TECHNIQUE: Multiplanar, multiecho pulse sequences of the brain and surrounding structures were obtained without intravenous contrast. COMPARISON:  CT same day.  CT 05/08/2019. FINDINGS: Brain: Diffusion imaging shows a small region of restricted diffusion along the right lateral thalamus/posterior limb internal capsule. This probably explains the clinical presentation. No other acute or subacute infarction. Elsewhere, brainstem shows chronic small-vessel ischemic changes of the pons. No focal cerebellar insult. Cerebral hemispheres show mild chronic small-vessel change of the white matter for a person of this age. No large vessel territory infarction. No mass lesion, hemorrhage, hydrocephalus or extra-axial collection. Vascular: Major vessels at the base of the brain show flow. Skull and upper cervical spine: Negative Sinuses/Orbits: Sinuses are clear except for opacification of a posterior ethmoid air cell on the right. Orbits are negative. Other: None IMPRESSION: Small region of acute infarction on the right affecting the lateral thalamus/posterior limb internal capsule. Age related chronic small-vessel ischemic changes elsewhere throughout the brain as above. Electronically Signed   By: Nelson Chimes M.D.   On: 10/08/2019 14:46   ECHOCARDIOGRAM COMPLETE  Result Date: 10/09/2019   ECHOCARDIOGRAM REPORT   Patient Name:   Erik Moss Date of Exam: 10/09/2019 Medical Rec #:  [id number]        Height:       66.0 in Accession #:    [id number]       Weight:       144.6 lb Date of Birth:  1933/03/28         BSA:          1.74 m Patient Age:    85 years         BP:           145/99 mmHg Patient Gender: M                HR:           89 bpm. Exam Location:  Inpatient Procedure: 2D Echo Indications:    Stroke 434.91 / I163.9  History:        Patient has no prior history of Echocardiogram examinations.                 Stroke; Risk Factors:Diabetes, Dyslipidemia, Hypertension and                 Non-Smoker. Acute Renal Failure.  Sonographer:    Leavy Cella Referring Phys: [id number] DAVID MANUEL [place]  1. There is a small mass (0.6 cm x 0.6 cm) that appears to be attached to the [place] of the AoV. In certain views it appears to be focal calcification, but there does appear to be a mobile component to it in the basal SAX views. This could represent a papillary fibroelastoma vs vegetation. Would recommend a TEE for better  characterization, especially in this patient with concerns for stroke.  2. Left ventricular ejection fraction, by visual estimation, is 65 to 70%. The left ventricle has normal function. There is no left ventricular hypertrophy.  3. Global right ventricle has normal systolic function.The right ventricular size is normal. No increase in right ventricular wall thickness.  4. Presence of pericardial fat pad.  5. Small pericardial effusion.  6. The pericardial effusion is circumferential.  7. Mild mitral annular calcification.  8. The mitral valve is grossly normal. Trivial mitral valve regurgitation.  9. The tricuspid valve is grossly normal. Tricuspid valve regurgitation is trivial. 10. The aortic valve is tricuspid. Aortic valve regurgitation is not visualized. No evidence of aortic valve sclerosis or stenosis. 11. Normal pulmonary artery systolic pressure. 12. The inferior vena cava is normal in size with greater than 50% respiratory variability, suggesting right atrial pressure of 3 mmHg. 13. The pulmonic valve was grossly normal. Pulmonic valve regurgitation is trivial. 14. The left ventricle  has no regional wall motion abnormalities. 15. Indeterminate diastolic filling due to E-A fusion. 16. Left atrial size was normal. 17. Right atrial size was normal. 18. The tricuspid regurgitant velocity is 2.17 m/s, and with an assumed right atrial pressure of 3 mmHg, the estimated right ventricular systolic pressure is normal at 21.8 mmHg. FINDINGS  Left Ventricle: Left ventricular ejection fraction, by visual estimation, is 65 to 70%. The left ventricle has normal function. The left ventricle has no regional wall motion abnormalities. There is no left ventricular hypertrophy. Indeterminate diastolic filling due to E-A fusion. Right Ventricle: The right ventricular size is normal. No increase in right ventricular wall thickness. Global RV systolic function is has normal systolic function. The tricuspid regurgitant velocity is 2.17 m/s, and with an assumed right atrial pressure  of 3 mmHg, the estimated right ventricular systolic pressure is normal at 21.8 mmHg. Left Atrium: Left atrial size was normal in size. Right Atrium: Right atrial size was normal in size Pericardium: A small pericardial effusion is present. The pericardial effusion is circumferential. Presence of pericardial fat pad. Mitral Valve: The mitral valve is grossly normal. Mild mitral annular calcification. Trivial mitral valve regurgitation. Tricuspid Valve: The tricuspid valve is grossly normal. Tricuspid valve regurgitation is trivial. Aortic Valve: The aortic valve is tricuspid. Aortic valve regurgitation is not visualized. The aortic valve is structurally normal, with no evidence of sclerosis or stenosis. There is a small mass (0.6 cm x 0.6 cm) that appears to be attached to the [place] of the AoV. In certain views it appears to be focal calcification, but there does appear to be a mobile component to it in the basal SAX views. This could represent a papillary fibroelastoma vs vegetation. Would recommend a TEE for better characterization,  especially in this patient with concerns for stroke. Pulmonic Valve: The pulmonic valve was grossly normal. Pulmonic valve regurgitation is trivial. Pulmonic regurgitation is trivial. Aorta: The aortic root is normal in size and structure. Venous: The inferior vena cava is normal in size with greater than 50% respiratory variability, suggesting right atrial pressure of 3 mmHg. IAS/Shunts: No atrial level shunt detected by color flow Doppler.  LEFT VENTRICLE PLAX 2D LVIDd:         2.70 cm  Diastology LVIDs:         2.00 cm  LV e' lateral:   7.72 cm/s LV PW:         1.30 cm  LV E/e' lateral: 8.8 LV IVS:  1.10 cm  LV e' medial:    5.77 cm/s LVOT diam:     1.80 cm  LV E/e' medial:  11.8 LV SV:         14 ml LV SV Index:   8.15 LVOT Area:     2.54 cm  RIGHT VENTRICLE RV S prime:     6.34 cm/s TAPSE (M-mode): 2.4 cm LEFT ATRIUM             Index       RIGHT ATRIUM           Index LA diam:        3.30 cm 1.89 cm/m  RA Area:     12.70 cm LA Vol (A2C):   36.6 ml 21.01 ml/m RA Volume:   26.90 ml  15.44 ml/m LA Vol (A4C):   33.1 ml 19.00 ml/m LA Biplane Vol: 36.0 ml 20.66 ml/m   AORTA Ao Root diam: 2.90 cm MITRAL VALVE                         TRICUSPID VALVE MV Area (PHT): 5.27 cm              TR Peak grad:   18.8 mmHg MV PHT:        41.76 msec            TR Vmax:        217.00 cm/s MV Decel Time: 144 msec MV E velocity: 68.30 cm/s  103 cm/s  SHUNTS MV A velocity: 101.00 cm/s 70.3 cm/s Systemic Diam: 1.80 cm MV E/A ratio:  0.68        1.5  Eleonore Chiquito MD Electronically signed by Eleonore Chiquito MD Signature Date/Time: 10/09/2019/10:49:27 AM    Final    ECHO TEE  Result Date: 10/10/2019   TRANSESOPHOGEAL ECHO REPORT   Patient Name:   Erik Moss Date of Exam: 10/10/2019 Medical Rec #:  [id number]        Height:       66.0 in Accession #:    [id number]       Weight:       144.6 lb Date of Birth:  05-Jun-1933        BSA:          1.74 m Patient Age:    85 years         BP:           97/57 mmHg Patient  Gender: M                HR:           61 bpm. Exam Location:  Inpatient  Procedure: Transesophageal Echo, Cardiac Doppler and Color Doppler Indications:     CVA  History:         Patient has prior history of Echocardiogram examinations, most                  recent 10/09/2019.  Sonographer:     Dustin Flock Referring Phys:  [place] Diagnosing Phys: Eleonore Chiquito MD  PROCEDURE: Normal Transesophogeal exam. After discussion of the risks and benefits of a TEE, an informed consent was obtained from the patient. The transesophogeal probe was passed through the esophogus of the patient. Image quality was excellent. Supplementary images were obtained from transthoracic windows as indicated to answer the clinical question. The patient developed no complications during the procedure. IMPRESSIONS  1. There  is protruding calcified atheroma in the aortic root (best seen image 42). It is mass-like and measures 0.7 cm x 0.5 cm. This was likely seen in and out of plane on transthoracic imaging around the aortic valve. There is no mass or vegetation attached to the aortic valve. This represents severe (Grade 4) atheroma in the aortic root. There is moderate (Grade 3) atheroma in the descending aorta.  2. Left ventricular ejection fraction, by visual estimation, is 60 to 65%. The left ventricle has normal function. There is no left ventricular hypertrophy.  3. Global right ventricle has normal systolic function.The right ventricular size is normal. No increase in right ventricular wall thickness.  4. Left atrial size was normal.  5. No LAA thrombus. LAA emptying velocity 91 cm/s.  6. The mitral valve is grossly normal. Mild mitral valve regurgitation.  7. The tricuspid valve is grossly normal. Tricuspid valve regurgitation is mild.  8. The aortic valve is tricuspid. Aortic valve regurgitation is not visualized. No evidence of aortic valve sclerosis or stenosis.  9. The pulmonic valve was grossly normal.  Pulmonic valve regurgitation is trivial. 10. Severe plaque invoving the aortic root. 11. IAS septum is hypermobile but doesn't meet criteria for aneurysm. 12. Right atrial size was normal. 13. The left ventricle has no regional wall motion abnormalities. 14. Trivial pericardial effusion is present. FINDINGS  Left Ventricle: Left ventricular ejection fraction, by visual estimation, is 60 to 65%. The left ventricle has normal function. The left ventricle has no regional wall motion abnormalities. There is no left ventricular hypertrophy. Right Ventricle: The right ventricular size is normal. No increase in right ventricular wall thickness. Global RV systolic function is has normal systolic function. Left Atrium: Left atrial size was normal in size. No LAA thrombus. LAA emptying velocity 91 cm/s. Right Atrium: Right atrial size was normal in size Pericardium: Trivial pericardial effusion is present. Mitral Valve: The mitral valve is grossly normal. Mild mitral valve regurgitation. There is no evidence of mitral valve vegetation. Tricuspid Valve: The tricuspid valve is grossly normal. Tricuspid valve regurgitation is mild. Aortic Valve: The aortic valve is tricuspid. Aortic valve regurgitation is not visualized. The aortic valve is structurally normal, with no evidence of sclerosis or stenosis. Pulmonic Valve: The pulmonic valve was grossly normal. Pulmonic valve regurgitation is trivial. Aorta: The aortic root and ascending aorta are structurally normal, with no evidence of dilitation. There is severe, protruding plaque involving the aortic root. There is protruding calcified atheroma in the aortic root (best seen image 42). It is mass-like and measures 0.7 cm x 0.5 cm. This was likely seen in and out of plane on transthoracic imaging around the aortic valve. There is no mass or vegetation attached to the aortic valve. This represents severe (Grade 4) atheroma in the aortic root. There is moderate (Grade 3) atheroma in  the descending aorta. Venous: The left upper pulmonary vein is and the right upper pulmonary vein is normal. Shunts: Saline contrast bubble study was negative, with no evidence of any interatrial shunt. IAS septum is hypermobile but doesn't meet criteria for aneurysm. No atrial level shunt detected by color flow Doppler.   AORTA                 Normals Ao Root diam: 3.40 cm 31 mm Ao STJ diam:  2.6 cm Ao Asc diam:  3.00 cm 31 mm  Eleonore Chiquito MD Electronically signed by Eleonore Chiquito MD Signature Date/Time: 10/10/2019/12:30:01 PM    Final  PHYSICAL EXAM    Temp:  [97.5 F (36.4 C)-98.7 F (37.1 C)] 97.5 F (36.4 C) (12/17 1250) Pulse Rate:  [55-79] 56 (12/17 1250) Resp:  [10-22] 18 (12/17 1250) BP: (91-174)/(39-81) 148/70 (12/17 1250) SpO2:  [96 %-100 %] 99 % (12/17 1250)  General - Well nourished, well developed, in no apparent distress.  Ophthalmologic - fundi not visualized due to noncooperation.  Cardiovascular - Regular rhythm and rate.  Mental Status -  Level of arousal and orientation to time, place, and person were intact. Language including expression, naming, repetition, comprehension was assessed and found intact. Fund of Knowledge was assessed and was intact.  Cranial Nerves II - XII - II - Visual field intact OU. III, IV, VI - Extraocular movements intact. V - Facial sensation intact bilaterally. VII - Facial movement intact bilaterally. VIII - Hearing & vestibular intact bilaterally. X - Palate elevates symmetrically. XI - Chin turning & shoulder shrug intact bilaterally. XII - Tongue protrusion intact.  Motor Strength - The patient's strength was normal in all extremities and pronator drift was absent.  Bulk was normal and fasciculations were absent.   Motor Tone - Muscle tone was assessed at the neck and appendages and was normal.  Reflexes - The patient's reflexes were symmetrical in all extremities and he had no pathological reflexes.  Sensory - Light  touch, temperature/pinprick were assessed and were symmetrical.    Coordination - The patient had normal movements in the hands and feet with no ataxia or dysmetria.  Tremor was absent.  Gait and Station - deferred.   ASSESSMENT/PLAN Mr. Aryav Davia is a 85 y.o. male with history of HTN, HLD, DB who woke with difficulty walking with L leg weakness, resolving prior to arrival to ED.   Stroke:  right lateral thalamic/PLLIC infarct likely secondary to small vessel disease source.   CT head No acute abnormality. Small vessel disease.    MRI  Small R lateral thalamus/PLIC infarct . Small vessel disease.   MRA head  Unremarkable   MRA neck Unremarkable   2D Echo small mass on NCC appears to be mobile (calcificaiton, papillary fibroelastoma vs veg). EF 65-70%.  TEE severe grade 4 atheroma in the aortic root, grade 3 in descending aorta. No mass, thrombus. Hypermobile IAS. EF 60-65%  LDL 97  HgbA1c 7.9  SCDs for VTE prophylaxis  No antithrombotic prior to admission, now on aspirin 81 mg daily and clopidogrel 75 mg daily. 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 EF 65-70%.  TEE severe grade 4 atheroma in the aortic root, grade 3 in descending aorta. No mass, thrombus. Hypermobile IAS. EF 60-65%  Blood cultures no growth < 24h  Continue DAPT  Hypertension  Stable . Gradually normalize in 3-5 days . Long-term BP goal normotensive  Hyperlipidemia  Home meds:  zocor 20  Now on lipitor 40  LDL 97, goal < 70  Continue statin at discharge  Diabetes type II Uncontrolled  HgbA1c 7.9, goal < 7.0  CBGs  SSI  Close PCP follow up for better DM control  Other Stroke Risk Factors  Advanced age  Other Active Problems  BPH on flomax  CKD stage IV, Cre 1.92  Mild  cognitive impairment - He told me that he has been working on his classes for his degree.  He had his final exam one week ago but he failed. He is asking whether his health condition can be the cause of his failure. I told him that the stroke occurred after his exam so the stroke likely not the cause for him to fail, but his ongoing chronic condition like DM, HTN, HLD may cause cognitive impairment which might be the cause of his failure. He is in agreement and asking whether a letter can be written for him to give to his professor. I recommend him to contact his PCP. I also sent a Epic message to his PCP.   Hospital day # 2  Neurology will sign off. Please call with questions. Pt will follow up with stroke clinic NP at [facility] in about 4 weeks. Thanks for the consult.   Rosalin Hawking, MD PhD Stroke Neurology 10/10/2019 1:46 PM  To contact Stroke Continuity provider, please refer to [email or web address] After hours, contact General Neurology

## 2019-10-10 NOTE — Interval H&P Note (Signed)
History and Physical Interval Note:  10/10/2019 10:40 AM  N[place]in  has presented today for surgery, with the diagnosis of STROKE.  The various methods of treatment have been discussed with the patient and family. After consideration of risks, benefits and other options for treatment, the patient has consented to  Procedure(s): TRANSESOPHAGEAL ECHOCARDIOGRAM (TEE) (N/A) as a surgical intervention.  The patient's history has been reviewed, patient examined, no change in status, stable for surgery.  I have reviewed the patient's chart and labs.  Questions were answered to the patient's satisfaction.    TEE for aortic valve mass in setting of stroke.   [name] T. Audie Box, [place]  [address], [place], [place] [address] [phone]  10:40 AM

## 2019-10-10 NOTE — Progress Notes (Addendum)
Patient educated on discharge instructions, belongings returned to patient (clothes, bag with wallet, glasses, phone&charger). Transportation home provided by daughter [name].

## 2019-10-10 NOTE — TOC Transition Note (Signed)
Transition of Care [facility]) - CM/SW Discharge Note   Patient Details  Name: Erik Moss MRN: [id number] Date of Birth: 06-17-1933  [facility] CM/SW Contact:  Pollie Friar, RN Phone Number: 10/10/2019, 1:57 PM   Clinical Narrative:    Pt discharging home with outpatient therapy. Information is on his AVS. DME for home is at the bedside.  Pt has transportation home when ready.    Final next level of care: OP Rehab Barriers to Discharge: No Barriers Identified   Patient Goals and CMS Choice   CMS Medicare.gov Compare Post Acute Care list provided to:: Patient Represenative (must comment) Choice offered to / list presented to : Adult Children  Discharge Placement                       Discharge Plan and Services                DME Arranged: 3-N-1, Walker rolling DME Agency: [employer] Date DME Agency Contacted: 10/09/19   Representative spoke with at DME Agency: [email or web address]            Social Determinants of Health ([place]) Interventions     Readmission Risk Interventions No flowsheet data found.

## 2019-10-11 DIAGNOSIS — Z8249 Family history of ischemic heart disease and other diseases of the circulatory system: Secondary | ICD-10-CM | POA: Diagnosis not present

## 2019-10-11 DIAGNOSIS — E785 Hyperlipidemia, unspecified: Secondary | ICD-10-CM | POA: Diagnosis not present

## 2019-10-11 DIAGNOSIS — N189 Chronic kidney disease, unspecified: Secondary | ICD-10-CM | POA: Diagnosis not present

## 2019-10-11 DIAGNOSIS — Z7984 Long term (current) use of oral hypoglycemic drugs: Secondary | ICD-10-CM | POA: Diagnosis not present

## 2019-10-11 DIAGNOSIS — K5900 Constipation, unspecified: Secondary | ICD-10-CM | POA: Diagnosis not present

## 2019-10-11 DIAGNOSIS — E1122 Type 2 diabetes mellitus with diabetic chronic kidney disease: Secondary | ICD-10-CM | POA: Diagnosis not present

## 2019-10-11 DIAGNOSIS — Z823 Family history of stroke: Secondary | ICD-10-CM | POA: Diagnosis not present

## 2019-10-11 DIAGNOSIS — N400 Enlarged prostate without lower urinary tract symptoms: Secondary | ICD-10-CM | POA: Diagnosis not present

## 2019-10-11 DIAGNOSIS — R32 Unspecified urinary incontinence: Secondary | ICD-10-CM | POA: Diagnosis not present

## 2019-10-11 DIAGNOSIS — I129 Hypertensive chronic kidney disease with stage 1 through stage 4 chronic kidney disease, or unspecified chronic kidney disease: Secondary | ICD-10-CM | POA: Diagnosis not present

## 2019-10-11 SURGERY — ECHOCARDIOGRAM, TRANSESOPHAGEAL
Anesthesia: General

## 2019-10-14 LAB — CULTURE, BLOOD (ROUTINE X 2)
Culture: NO GROWTH
Culture: NO GROWTH
Special Requests: ADEQUATE
Special Requests: ADEQUATE

## 2019-11-06 DIAGNOSIS — I10 Essential (primary) hypertension: Secondary | ICD-10-CM | POA: Diagnosis not present

## 2019-11-06 DIAGNOSIS — Z09 Encounter for follow-up examination after completed treatment for conditions other than malignant neoplasm: Secondary | ICD-10-CM | POA: Diagnosis not present

## 2019-11-06 DIAGNOSIS — E782 Mixed hyperlipidemia: Secondary | ICD-10-CM | POA: Diagnosis not present

## 2019-11-06 DIAGNOSIS — E1169 Type 2 diabetes mellitus with other specified complication: Secondary | ICD-10-CM | POA: Diagnosis not present

## 2019-11-06 DIAGNOSIS — N184 Chronic kidney disease, stage 4 (severe): Secondary | ICD-10-CM | POA: Diagnosis not present

## 2019-11-14 ENCOUNTER — Ambulatory Visit: Payer: Medicare HMO | Attending: Internal Medicine

## 2019-11-14 DIAGNOSIS — Z23 Encounter for immunization: Secondary | ICD-10-CM | POA: Insufficient documentation

## 2019-11-14 NOTE — Progress Notes (Signed)
   Covid-19 Vaccination Clinic  Name:  Erik Moss    MRN: [id number] DOB: 1933-05-24  11/14/2019  Erik Moss was observed post Covid-19 immunization for 15 minutes without incidence. He was provided with Vaccine Information Sheet and instruction to access the V-Safe system.   Erik Moss was instructed to call 911 with any severe reactions post vaccine: Marland Kitchen Difficulty breathing  . Swelling of your face and throat  . A fast heartbeat  . A bad rash all over your body  . Dizziness and weakness    Immunizations Administered    Name Date Dose VIS Date Route   Pfizer COVID-19 Vaccine 11/14/2019  1:16 PM 0.3 mL 10/04/2019 Intramuscular   Manufacturer: [place]   Lot: [id number]   [place]: [id number]

## 2019-11-19 ENCOUNTER — Ambulatory Visit: Payer: Medicare HMO | Admitting: Adult Health

## 2019-11-19 ENCOUNTER — Encounter: Payer: Self-pay | Admitting: Adult Health

## 2019-11-19 ENCOUNTER — Other Ambulatory Visit: Payer: Self-pay

## 2019-11-19 VITALS — BP 123/73 | HR 94 | Ht 66.0 in | Wt 151.0 lb

## 2019-11-19 DIAGNOSIS — I69398 Other sequelae of cerebral infarction: Secondary | ICD-10-CM | POA: Diagnosis not present

## 2019-11-19 DIAGNOSIS — I10 Essential (primary) hypertension: Secondary | ICD-10-CM

## 2019-11-19 DIAGNOSIS — R2689 Other abnormalities of gait and mobility: Secondary | ICD-10-CM | POA: Diagnosis not present

## 2019-11-19 DIAGNOSIS — E785 Hyperlipidemia, unspecified: Secondary | ICD-10-CM | POA: Diagnosis not present

## 2019-11-19 DIAGNOSIS — E1165 Type 2 diabetes mellitus with hyperglycemia: Secondary | ICD-10-CM | POA: Diagnosis not present

## 2019-11-19 DIAGNOSIS — I6381 Other cerebral infarction due to occlusion or stenosis of small artery: Secondary | ICD-10-CM | POA: Diagnosis not present

## 2019-11-19 NOTE — Progress Notes (Signed)
I agree with the above plan 

## 2019-11-19 NOTE — Progress Notes (Signed)
[name] [address] [place]. [place] [address] [phone]       HOSPITAL FOLLOW UP NOTE  Mr. Curly Caruthers Date of Birth:  1933-08-31 Medical Record Number:  [id number]   Reason for Referral:  hospital stroke follow up    CHIEF COMPLAINT:  Chief Complaint  Patient presents with  . Cerebrovascular Accident    hospital FU, dgtr- [name]    HPI: Zameir Seniorsis being seen today for in office hospital follow-up regarding right lateral thalamic/PL IC infarct secondary to small vessel disease source on 10/08/2019.  History obtained from patient, daughter and chart review. Reviewed all radiology images and labs personally.  Mr. Sione Asch is a 86 y.o. male with history of HTN, HLD, DB who woke on 10/08/2019 with difficulty walking with L leg weakness, resolving prior to arrival to ED. Evaluated by Dr. Erlinda Hong and stroke team with stroke work-up revealing right lateral thalamic/PL IC infarct as evidenced on MRI likely secondary to small vessel disease source.  MRA head/neck unremarkable.  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  TEE showed severe grade 4 atheroma in the aortic root, grade 3 and descending aorta with hypermobile IAS but no evidence of mass or thrombus identified.  Obtain blood cultures which did not show growth for greater than 24 hours.  Advised to continue on DAPT.  Recommended DAPT for 3 weeks and aspirin alone.  HTN stable.  LDL 97 and recommended switching Zocor to atorvastatin 40 mg daily.  Uncontrolled DM with A1c 7.9 and recommended close PCP follow-up for better DM control.  Other stroke risk factors include advanced age but no prior history of stroke.  Other active problems include BPH on Flomax, CKD stage IV and mild cognitive impairment.  Evaluated by therapies and recommended discharging home with outpatient PT/OT with residual imbalance and  cognitive.  Mr. Caravello is a 86 year old male who is being seen today for hospital follow-up accompanied by his daughter and granddaughter.  Residual stroke deficits including imbalance, occasional dysarthria and increased fatigue.  He has not initiated therapies at this time as daughter has not had a chance to schedule but plans on calling neuro rehab to schedule initial evaluations.  At hospital discharge, it was recommended for him to use rolling walker at all times due to imbalance and safety concerns.  Daughter states that he refuses to use any type of assistive device but will furniture surf throughout the house.  Denies any recent falls.  He continues to live with his daughter and moved in with her from [facility] last summer due to COVID-19 concerns.  No further concerns regarding memory or cognition.  He has completed 3 weeks DAPT and continues on aspirin alone without bleeding or bruising.  Continues on atorvastatin without myalgias.  Blood pressure today satisfactory at 123/73.  Glucose levels monitored routinely at home which have been stable per patient.  He has received his initial Covid vaccine recently and plans on obtaining second dose on 12/05/2019.  Denies new or worsening stroke/TIA symptoms.    ROS:   14 system review of systems performed and negative with exception of gait abnormality, fatigue and occasional mild slurred speech  PMH:  Past Medical History:  Diagnosis Date  . BPH (benign prostatic hyperplasia) 10/08/2019  . Diabetes mellitus without complication ([place])   . Hyperlipidemia 10/08/2019  . Hypertension 10/08/2019  . Stroke ([place])   . Type 2 diabetes mellitus (  [place]) 10/08/2019    PSH:  Past Surgical History:  Procedure Laterality Date  . BUBBLE STUDY  10/10/2019   Procedure: BUBBLE STUDY;  Surgeon: Geralynn Rile, MD;  Location: [place];  Service: Cardiology;;  . TEE WITHOUT CARDIOVERSION N/A 10/10/2019   Procedure: TRANSESOPHAGEAL ECHOCARDIOGRAM (TEE);   Surgeon: Geralynn Rile, MD;  Location: [facility];  Service: Cardiology;  Laterality: N/A;    Social History:  Social History   Socioeconomic History  . Marital status: Divorced    Spouse name: Not on file  . Number of children: Not on file  . Years of education: Not on file  . Highest education level: Not on file  Occupational History  . Not on file  Tobacco Use  . Smoking status: Never Smoker  . Smokeless tobacco: Never Used  Substance and Sexual Activity  . Alcohol use: Never  . Drug use: Never  . Sexual activity: Not on file  Other Topics Concern  . Not on file  Social History Narrative   11/19/19 lives with dgtr, grand dgtr   Social Determinants of Health   Financial Resource Strain:   . Difficulty of Paying Living Expenses: Not on file  Food Insecurity:   . Worried About Charity fundraiser in the Last Year: Not on file  . Ran Out of Food in the Last Year: Not on file  Transportation Needs:   . Lack of Transportation (Medical): Not on file  . Lack of Transportation (Non-Medical): Not on file  Physical Activity:   . Days of Exercise per Week: Not on file  . Minutes of Exercise per Session: Not on file  Stress:   . Feeling of Stress : Not on file  Social Connections:   . Frequency of Communication with Friends and Family: Not on file  . Frequency of Social Gatherings with Friends and Family: Not on file  . Attends Religious Services: Not on file  . Active Member of Clubs or Organizations: Not on file  . Attends Archivist Meetings: Not on file  . Marital Status: Not on file  Intimate Partner Violence:   . Fear of Current or Ex-Partner: Not on file  . Emotionally Abused: Not on file  . Physically Abused: Not on file  . Sexually Abused: Not on file    Family History: No family history on file.  Medications:   Current Outpatient Medications on File Prior to Visit  Medication Sig Dispense Refill  . amLODipine (NORVASC) 5 MG tablet Take 5  mg by mouth daily.    Marland Kitchen aspirin EC 81 MG EC tablet Take 1 tablet (81 mg total) by mouth daily. 90 tablet 0  . atorvastatin (LIPITOR) 40 MG tablet Take 1 tablet (40 mg total) by mouth daily. 90 tablet 0  . clopidogrel (PLAVIX) 75 MG tablet Take 75 mg by mouth daily.    . ferrous gluconate (FERGON) 324 MG tablet Take 648 mg by mouth daily.     Marland Kitchen glimepiride (AMARYL) 1 MG tablet Take 1 mg by mouth daily with breakfast.    . Multiple Vitamin (MULTIVITAMIN WITH MINERALS) TABS tablet Take 1 tablet by mouth daily.    . sitaGLIPtin (JANUVIA) 25 MG tablet Take 25 mg by mouth daily.    . sodium bicarbonate 650 MG tablet Take 650 mg by mouth 2 (two) times daily.    . tamsulosin (FLOMAX) 0.4 MG CAPS capsule Take 0.4 mg by mouth at bedtime.     . triamcinolone ointment (KENALOG)  0.1 % Apply 1 application topically daily as needed (itching on lower legs.).      No current facility-administered medications on file prior to visit.    Allergies:  No Known Allergies   Physical Exam  Vitals:   11/19/19 0925  BP: 123/73  Pulse: 94  Weight: 151 lb (68.5 kg)  Height: 5\' 6"  (1.676 m)   Body mass index is 24.37 kg/m. No exam data present  Depression screen [facility] 2/9 11/19/2019  Decreased Interest 0  Down, Depressed, Hopeless 0  PHQ - 2 Score 0     General: well developed, well nourished,  very pleasant elderly African-American male, seated, in no evident distress Head: head normocephalic and atraumatic.   Neck: supple with no carotid or supraclavicular bruits Cardiovascular: regular rate and rhythm, no murmurs Musculoskeletal: no deformity Skin:  no rash/petichiae Vascular:  Normal pulses all extremities   Neurologic Exam Mental Status: Awake and fully alert.   Normal speech and language during visit.  Oriented to place and time. Recent and remote memory intact. Attention span, concentration and fund of knowledge appropriate. Mood and affect appropriate.  Cranial Nerves: Fundoscopic exam reveals  sharp disc margins. Pupils equal, briskly reactive to light. Extraocular movements full without nystagmus. Visual fields full to confrontation. Hearing intact. Facial sensation intact. Face, tongue, palate moves normally and symmetrically.  Motor: Normal bulk and tone. Normal strength in all tested extremity muscles. Sensory.: intact to touch , pinprick , position and vibratory sensation.  Coordination: Rapid alternating movements normal in all extremities. Finger-to-nose and heel-to-shin performed accurately bilaterally. Gait and Station: Arises from chair without difficulty. Stance is normal. Gait demonstrates normal stride length and mild imbalance initially with holding onto walls but after slowing his gait he was able to ambulate steadily without great difficulty Reflexes: 1+ and symmetric. Toes downgoing.     NIHSS  0 Modified Rankin  2    Diagnostic Data (Labs, Imaging, Testing)  CT HEAD WO CONTRAST 10/08/2019 IMPRESSION: Mild chronic ischemic white matter disease. No acute intracranial abnormality seen.   MR BRAIN WO CONTRAST 10/08/2019 IMPRESSION: Small region of acute infarction on the right affecting the lateral thalamus/posterior limb internal capsule. Age related chronic small-vessel ischemic changes elsewhere throughout the brain as above.  MR MRA HEAD  MR MRA NECK 10/09/2019 IMPRESSION: No large vessel occlusion or significant stenosis.  ECHOCARDIOGRAM 10/09/2019 IMPRESSIONS  1. There is a small mass (0.6 cm x 0.6 cm) that appears to be attached to the [place] of the AoV. In certain views it appears to be focal calcification, but there does appear to be a mobile component to it in the basal SAX views. This could represent a  papillary fibroelastoma vs vegetation. Would recommend a TEE for better characterization, especially in this patient with concerns for stroke.  2. Left ventricular ejection fraction, by visual estimation, is 65 to 70%. The left ventricle has  normal function. There is no left ventricular hypertrophy.  3. Global right ventricle has normal systolic function.The right ventricular size is normal. No increase in right ventricular wall thickness.  4. Presence of pericardial fat pad.  5. Small pericardial effusion.  6. The pericardial effusion is circumferential.  7. Mild mitral annular calcification.  8. The mitral valve is grossly normal. Trivial mitral valve regurgitation.  9. The tricuspid valve is grossly normal. Tricuspid valve regurgitation is trivial. 10. The aortic valve is tricuspid. Aortic valve regurgitation is not visualized. No evidence of aortic valve sclerosis or stenosis. 11. Normal pulmonary artery systolic  pressure. 12. The inferior vena cava is normal in size with greater than 50% respiratory variability, suggesting right atrial pressure of 3 mmHg. 13. The pulmonic valve was grossly normal. Pulmonic valve regurgitation is trivial. 14. The left ventricle has no regional wall motion abnormalities. 15. Indeterminate diastolic filling due to E-A fusion. 16. Left atrial size was normal. 17. Right atrial size was normal. 18. The tricuspid regurgitant velocity is 2.17 m/s, and with an assumed right atrial pressure of 3 mmHg, the estimated right ventricular systolic pressure is normal at 21.8 mmHg.  ECHO TEE 10/10/2019 IMPRESSIONS  1. There is protruding calcified atheroma in the aortic root (best seen image 42). It is mass-like and measures 0.7 cm x 0.5 cm. This was likely seen in and out of plane on transthoracic imaging around the aortic valve. There is no mass or vegetation  attached to the aortic valve. This represents severe (Grade 4) atheroma in the aortic root. There is moderate (Grade 3) atheroma in the descending aorta.  2. Left ventricular ejection fraction, by visual estimation, is 60 to 65%. The left ventricle has normal function. There is no left ventricular hypertrophy.  3. Global right ventricle has normal  systolic function.The right ventricular size is normal. No increase in right ventricular wall thickness.  4. Left atrial size was normal.  5. No LAA thrombus. LAA emptying velocity 91 cm/s.  6. The mitral valve is grossly normal. Mild mitral valve regurgitation.  7. The tricuspid valve is grossly normal. Tricuspid valve regurgitation is mild.  8. The aortic valve is tricuspid. Aortic valve regurgitation is not visualized. No evidence of aortic valve sclerosis or stenosis.  9. The pulmonic valve was grossly normal. Pulmonic valve regurgitation is trivial. 10. Severe plaque invoving the aortic root. 11. IAS septum is hypermobile but doesn't meet criteria for aneurysm. 12. Right atrial size was normal. 13. The left ventricle has no regional wall motion abnormalities. 14. Trivial pericardial effusion is present.       ASSESSMENT: Erik Moss is a 86 y.o. year old male presented with left leg weakness upon awakening on 10/08/2019 with stroke work-up revealing right lateral thalamic/PL IC infarct likely secondary to small vessel disease source. Vascular risk factors include HTN, HLD uncontrolled DM and advanced age.  Of note, 2D echo and TEE showed likely calcification.  He has been recovering well from a stroke standpoint with residual imbalance and occasional mild dysarthria.  No further concerns from patient or family regarding memory or cognition    PLAN:  1. Right thalamic/PLIC stroke: Continue aspirin 81 mg daily  and atorvastatin for secondary stroke prevention. Maintain strict control of hypertension with blood pressure goal below 130/90, diabetes with hemoglobin A1c goal below 6.5% and cholesterol with LDL cholesterol (bad cholesterol) goal below 70 mg/dL.  I also advised the patient to eat a healthy diet with plenty of whole grains, cereals, fruits and vegetables, exercise regularly with at least 30 minutes of continuous activity daily and maintain ideal body weight. 2. HTN: Advised  to continue current treatment regimen.  Today's BP stable.  Advised to continue to monitor at home along with continued follow-up with PCP for management 3. HLD: Advised to continue current treatment regimen along with continued follow-up with PCP for future prescribing and monitoring of lipid panel 4. DMII: Advised to continue to monitor glucose levels at home along with continued follow-up with PCP for management and monitoring 5. Imbalance, poststroke: Advised to stop into neuro rehab after today's visit to schedule initial evaluations  for ongoing hopeful improvement    Follow up in 3 months or call earlier if needed   Greater than 50% of time during this 45 minute visit was spent on counseling, explanation of diagnosis of right thalamic/PLIC stroke, reviewing risk factor management of HTN, HLD and DM, planning of further management along with potential future management, and discussion with patient and family answering all questions.    Frann Rider, AGNP-BC  [facility] [address] [place], [place] [address]  Phone [phone] Fax [phone] Note: This document was prepared with digital dictation and possible smart phrase technology. Any transcriptional errors that result from this process are unintentional.

## 2019-11-19 NOTE — Patient Instructions (Signed)
Continue aspirin 81 mg daily  and atorvastatin  for secondary stroke prevention  Continue to follow up with PCP regarding cholesterol, diabetes and blood pressure management   Start therapies including physical and occupational therapy for ongoing hopeful improvement  Continue to monitor blood pressure and sugar levels at home  Maintain strict control of hypertension with blood pressure goal below 130/90, diabetes with hemoglobin A1c goal below 6.5% and cholesterol with LDL cholesterol (bad cholesterol) goal below 70 mg/dL. I also advised the patient to eat a healthy diet with plenty of whole grains, cereals, fruits and vegetables, exercise regularly and maintain ideal body weight.  Followup in the future with me in 3 months or call earlier if needed       Thank you for coming to see [place] at [facility]. I hope we have been able to provide you high quality care today.  You may receive a patient satisfaction survey over the next few weeks. We would appreciate your feedback and comments so that we may continue to improve ourselves and the health of our patients.

## 2019-11-25 ENCOUNTER — Ambulatory Visit: Payer: Medicare HMO | Attending: Internal Medicine | Admitting: Occupational Therapy

## 2019-11-25 ENCOUNTER — Other Ambulatory Visit: Payer: Self-pay

## 2019-11-25 ENCOUNTER — Encounter: Payer: Self-pay | Admitting: Occupational Therapy

## 2019-11-25 DIAGNOSIS — R2681 Unsteadiness on feet: Secondary | ICD-10-CM | POA: Insufficient documentation

## 2019-11-25 DIAGNOSIS — R293 Abnormal posture: Secondary | ICD-10-CM | POA: Insufficient documentation

## 2019-11-25 DIAGNOSIS — R2689 Other abnormalities of gait and mobility: Secondary | ICD-10-CM | POA: Insufficient documentation

## 2019-11-25 NOTE — Therapy (Signed)
[facility] [address] [place], [place], [address] Phone: [phone]   Fax:  [phone]  Occupational Therapy Evaluation  Patient Details  Name: Erik Moss MRN: [id number] Date of Birth: 1933/01/25 No data recorded  Encounter Date: 11/25/2019  OT End of Session - 11/25/19 1815    Visit Number  1    Number of Visits  1       Past Medical History:  Diagnosis Date  . BPH (benign prostatic hyperplasia) 10/08/2019  . Diabetes mellitus without complication ([place])   . Hyperlipidemia 10/08/2019  . Hypertension 10/08/2019  . Stroke ([place])   . Type 2 diabetes mellitus ([place]) 10/08/2019    Past Surgical History:  Procedure Laterality Date  . BUBBLE STUDY  10/10/2019   Procedure: BUBBLE STUDY;  Surgeon: Geralynn Rile, MD;  Location: [facility];  Service: Cardiology;;  . TEE WITHOUT CARDIOVERSION N/A 10/10/2019   Procedure: TRANSESOPHAGEAL ECHOCARDIOGRAM (TEE);  Surgeon: Geralynn Rile, MD;  Location: [facility];  Service: Cardiology;  Laterality: N/A;    There were no vitals filed for this visit.  Subjective Assessment - 11/25/19 1626    Subjective   I have some restrictions for my diabetes    Patient is accompanied by:  Family member    Currently in Pain?  No/denies        [facility] OT Assessment - 11/25/19 0001      Assessment   Medical Diagnosis  R thalamic, PL IC Infarct    Referring Provider (OT)  --   Will send to Dr Dayna Ramus - PCP   Onset Date/Surgical Date  10/08/19    Hand Dominance  Left    Prior Therapy  None      Precautions   Precautions  Fall    Precaution Comments  furniture walks per daughter      Restrictions   Weight Bearing Restrictions  No      Prior Function   Level of Independence  Independent with basic ADLs;Independent with household mobility without device;Independent with community mobility without device;Independent with homemaking with ambulation    Vocation   Retired;Student   investment banking/ research   Leisure  movies, museum, walk, exercise, reading,       ADL   Eating/Feeding  Independent    Grooming  Independent    Upper Body Bathing  Independent    Lower Body Bathing  Modified independent    Upper Body Dressing  Independent    Lower Body Dressing  Independent    Toilet Transfer  Modified independent    [place] Transfer  Modified independent      IADL   Prior Level of Veterinary surgeon for transportation    Prior Level of Function Light Housekeeping  [place]  Does not participate in any housekeeping tasks    Prior Level of Function Meal Prep  Independent    Meal Prep  Able to complete simple warm meal prep    Prior Level of Function Sales executive  Relies on family or friends for transportation    Prior Level of Function Medication Managment  Independent    Medication Management  Is responsible for taking medication in correct dosages at correct time    Prior Level of Function Financial Management  Independent    Psychiatrist financial matters independently (budgets, writes checks, pays rent, bills goes to bank), collects and keeps track of income      Written Expression   Dominant Hand  Left    Handwriting  100% legible      Vision - History   Baseline Vision  Wears glasses all the time    Visual History  Cataracts    Additional Comments  Recent increase in prescription      Vision Assessment   Eye Alignment  Within Functional Limits    Ocular Range of Motion  Within Functional Limits    Alignment/Gaze Preference  Within Defined Limits    Tracking/Visual Pursuits  Able to track stimulus in all quads without difficulty      Activity Tolerance   Activity Tolerance  Endurance does not limit participation  in activity      Cognition   Overall Cognitive Status  Within Functional Limits for tasks assessed    Cognition Comments  Patient with history of small vessel disease.  Patient highly educated, slighlty impulsive, yet able to self report, follow directions, and understand humor in mildly distracting new environment      Observation/Other Assessments   Focus on Therapeutic Outcomes (FOTO)   NA-OT      Posture/Postural Control   Posture/Postural Control  No significant limitations      Sensation   Light Touch  Appears Intact    Stereognosis  Appears Intact    Hot/Cold  Appears Intact      Coordination   Gross Motor Movements are Fluid and Coordinated  No    Fine Motor Movements are Fluid and Coordinated  No    Right 9 Hole Peg Test  25.47    Left 9 Hole Peg Test  27.86    Coordination  Slight overactivation noted in RUE- With cueing able to correct.        Perception   Perception  Not tested      Praxis   Praxis  Intact      ROM / Strength   AROM / PROM / Strength  AROM;Strength      AROM   Overall AROM   Within functional limits for tasks performed    Overall AROM Comments  BUE- WFL'S      Strength   Overall Strength  Within functional limits for tasks performed    Overall Strength Comments  BUE- 4+/5 throughout      Hand Function   Right Hand Gross Grasp  Functional    Right Hand Grip (lbs)  62    Right Hand Lateral Pinch  15 lbs    Left Hand Gross Grasp  Functional    Left Hand Grip (lbs)  62    Left Hand Lateral Pinch  16 lbs                      OT Education - 11/25/19 1814    Education Details  Reviewed results of evaluation with patient and daughter    Person(s) Educated  Patient;Child(ren)    Methods  Explanation    Comprehension  Verbalized understanding                 Plan - 11/25/19 1815    Clinical Impression Statement  Patient presents to OT evaluation today following a brief hospitalization from 1215-10/10/19 for an  acute R Thalamic/ PL IC infarct.  Patient's medical history is significant for Diabetes  Mellitus,  Hypertension, Chronic Kidney Disease stage IV, and small vessel disease.   Patient demonstrates decreased balance with walking, and possible mild impulsivity - although patient is highly intelligent, and impulsivity may have been eagerness to respond to evaluator's questioning.  Patient is able to complete necessary ADL/IADL without assistance, and therefore is not felt to be a candidate to receive further OT services at this time.  Patient and daughter in agreement.  Anticipate patient will benefit from PT services to improve safety with functional mobility.    OT Occupational Profile and History  Detailed Assessment- Review of Records and additional review of physical, cognitive, psychosocial history related to current functional performance    Occupational performance deficits (Please refer to evaluation for details):  Leisure;Social Participation    Body Structure / Function / Physical Skills  Balance;GMC    Clinical Decision Making  Limited treatment options, no task modification necessary    Comorbidities Affecting Occupational Performance:  May have comorbidities impacting occupational performance   HTN, HLD, CKD IV, SVD, DM   Modification or Assistance to Complete Evaluation   No modification of tasks or assist necessary to complete eval    Plan  No further OT warranted    Consulted and Agree with Plan of Care  Patient;Family member/caregiver       Patient will benefit from skilled therapeutic intervention in order to improve the following deficits and impairments:   Body Structure / Function / Physical Skills: Balance, GMC       Visit Diagnosis: Unsteadiness on feet - Plan: Ot plan of care cert/re-cert    Problem List Patient Active Problem List   Diagnosis Date Noted  . Acute CVA (cerebrovascular accident) ([place]) 10/08/2019  . Type 2 diabetes mellitus ([place]) 10/08/2019  .  Hyperlipidemia 10/08/2019  . BPH (benign prostatic hyperplasia) 10/08/2019  . Hypertension 10/08/2019  . ARF (acute renal failure) ([place]) 05/08/2019    Mariah Milling, OTR/L 11/25/2019, 6:34 PM  [facility] [address] [place], [place], [address] Phone: [phone]   Fax:  [phone]  Name: Ekansh Ditter MRN: [id number] Date of Birth: 1932-10-25

## 2019-11-28 ENCOUNTER — Encounter: Payer: Self-pay | Admitting: Rehabilitation

## 2019-11-28 ENCOUNTER — Other Ambulatory Visit: Payer: Self-pay

## 2019-11-28 ENCOUNTER — Ambulatory Visit: Payer: Medicare HMO | Admitting: Rehabilitation

## 2019-11-28 DIAGNOSIS — R293 Abnormal posture: Secondary | ICD-10-CM

## 2019-11-28 DIAGNOSIS — R2689 Other abnormalities of gait and mobility: Secondary | ICD-10-CM | POA: Diagnosis not present

## 2019-11-28 DIAGNOSIS — R2681 Unsteadiness on feet: Secondary | ICD-10-CM

## 2019-11-28 NOTE — Therapy (Signed)
[facility] [address] [place], [place], [address] Phone: [phone]   Fax:  [phone]  Physical Therapy Evaluation  Patient Details  Name: Erik Moss MRN: [id number] Date of Birth: 12-Jun-1933 Referring Provider (PT): Jenny Reichmann, MD   Encounter Date: 11/28/2019  PT End of Session - 11/28/19 1908    Visit Number  1    Number of Visits  9    Date for PT Re-Evaluation  01/27/20    Authorization Type  [employer] Medicare -10th visit PN needed    PT Start Time  1631    PT Stop Time  1700    PT Time Calculation (min)  29 min    Activity Tolerance  Patient tolerated treatment well    Behavior During Therapy  [facility] for tasks assessed/performed       Past Medical History:  Diagnosis Date  . BPH (benign prostatic hyperplasia) 10/08/2019  . Diabetes mellitus without complication ([place])   . Hyperlipidemia 10/08/2019  . Hypertension 10/08/2019  . Stroke ([place])   . Type 2 diabetes mellitus ([place]) 10/08/2019    Past Surgical History:  Procedure Laterality Date  . BUBBLE STUDY  10/10/2019   Procedure: BUBBLE STUDY;  Surgeon: Geralynn Rile, MD;  Location: [facility];  Service: Cardiology;;  . TEE WITHOUT CARDIOVERSION N/A 10/10/2019   Procedure: TRANSESOPHAGEAL ECHOCARDIOGRAM (TEE);  Surgeon: Geralynn Rile, MD;  Location: [facility];  Service: Cardiology;  Laterality: N/A;    There were no vitals filed for this visit.   Subjective Assessment - 11/28/19 1632    Subjective  Right now I don't feel like I'm having trouble at the moment.  But when I walk, I do like to be near something just in case.  I used to walk long distances everyday, and I'm not doing that anymore.    Pertinent History  BPH, DMII, HLD, CKD IV    Limitations  Walking    Patient Stated Goals  "I think I'm doing ok, but I want to get back to walking"    Currently in Pain?  No/denies         [facility] PT Assessment - 11/28/19 1634      Assessment   Medical Diagnosis  R thalamic PL IC CVA    Referring Provider (PT)  Jenny Reichmann, MD    Onset Date/Surgical Date  10/08/19    Hand Dominance  Left      Balance Screen   Has the patient fallen in the past 6 months  No    Has the patient had a decrease in activity level because of a fear of falling?   No    Is the patient reluctant to leave their home because of a fear of falling?   No      Home Environment   Living Environment  Private residence    Living Arrangements  Children   granddaughter   Available Help at Discharge  Family;Available 24 hours/day    Type of Home  House    Home Access  Stairs to enter    Entrance Stairs-Number of Steps  2    Home Layout  Two level    Alternate Level Stairs-Number of Steps  12    Alternate Level Stairs-Rails  Right;Left    Home Equipment  Walker - 2 wheels      Prior Function   Level of Independence  Independent with basic ADLs;Independent with household mobility without device;Independent with community mobility without device;Independent with  homemaking with ambulation    Vocation  Retired;Student    Leisure  movies, museum, walk, exercise, reading,       Cognition   Overall Cognitive Status  Within Functional Limits for tasks assessed      Sensation   Light Touch  Appears Intact    Stereognosis  Appears Intact    Hot/Cold  Appears Intact      Coordination   Gross Motor Movements are Fluid and Coordinated  Yes    Fine Motor Movements are Fluid and Coordinated  Yes    Heel Shin Test  [facility]      Posture/Postural Control   Posture/Postural Control  No significant limitations      ROM / Strength   AROM / PROM / Strength  Strength      Strength   Overall Strength  Within functional limits for tasks performed    Overall Strength Comments  BLEs 4+/5 to 5/5 throughout      Transfers   Transfers  Sit to Stand;Stand to Sit    Sit to Stand  7: Independent    Five time sit to stand comments   9.97 secs without UE support     Stand to Sit  7: Independent      Ambulation/Gait   Ambulation/Gait  Yes    Ambulation/Gait Assistance  6: Modified independent (Device/Increase time);4: Min guard    Ambulation Distance (Feet)  500 Feet    Assistive device  None    Gait Pattern  Lateral hip instability;Lateral trunk lean to right;Trunk flexed    Ambulation Surface  Level;Indoor    Gait velocity  3.30 ft/sec without device     Stairs  Yes    Stairs Assistance  6: Modified independent (Device/Increase time)    Stair Management Technique  One rail Right;Forwards;Alternating pattern    Number of Stairs  4    Height of Stairs  6    Gait Comments  Min/guard for higher level balance challenges.  Tends to have forward flexed trunk at times and also seems to stagger at times to the R.       Functional Gait  Assessment   Gait assessed   Yes    Gait Level Surface  Walks 20 ft in less than 7 sec but greater than 5.5 sec, uses assistive device, slower speed, mild gait deviations, or deviates 6-10 in outside of the 12 in walkway width.   6.38 secs    Change in Gait Speed  Able to smoothly change walking speed without loss of balance or gait deviation. Deviate no more than 6 in outside of the 12 in walkway width.    Gait with Horizontal Head Turns  Performs head turns smoothly with slight change in gait velocity (eg, minor disruption to smooth gait path), deviates 6-10 in outside 12 in walkway width, or uses an assistive device.    Gait with Vertical Head Turns  Performs head turns with no change in gait. Deviates no more than 6 in outside 12 in walkway width.    Gait and Pivot Turn  Pivot turns safely within 3 sec and stops quickly with no loss of balance.    Step Over Obstacle  Is able to step over one shoe box (4.5 in total height) without changing gait speed. No evidence of imbalance.    Gait with Narrow Base of Support  Ambulates 4-7 steps.    Gait with Eyes Closed  Walks 20 ft, uses assistive device, slower speed, mild  gait  deviations, deviates 6-10 in outside 12 in walkway width. Ambulates 20 ft in less than 9 sec but greater than 7 sec.    Ambulating Backwards  Walks 20 ft, uses assistive device, slower speed, mild gait deviations, deviates 6-10 in outside 12 in walkway width.    Steps  Alternating feet, must use rail.    Total Score  22    FGA comment:  19-24 = medium risk fall                Objective measurements completed on examination: See above findings.              PT Education - 11/28/19 1907    Education Details  Education on evaluation results, POC, goals    Person(s) Educated  Patient    Methods  Explanation    Comprehension  Verbalized understanding       PT Short Term Goals - 11/28/19 1914      PT SHORT TERM GOAL #1   Title  Pt will be IND with initial HEP (Target Date: 12/28/19)    Time  4    Period  Weeks    Status  New    Target Date  12/28/19      PT SHORT TERM GOAL #2   Title  Pt will improve FGA to >/=25/30 in order to indicate decreased fall risk.      PT SHORT TERM GOAL #3   Title  Pt will perform stairs without rail in alternating pattern at mod I level in order to indicate improved strength/balance.        PT Long Term Goals - 11/28/19 1916      PT LONG TERM GOAL #1   Title  Pt will be IND with final HEP for balance/endurance/strength. (Target Date: 01/27/20)    Time  8    Period  Weeks    Status  New    Target Date  01/27/20      PT LONG TERM GOAL #2   Title  Pt will improve FGA to >/=28/30 in order to indicate decreased fall risk.      PT LONG TERM GOAL #3   Title  Pt will ambulate at gait speed of 3.30 ft/sec or better with decreased staggering and upright posture to indicate decreased fall risk.      PT LONG TERM GOAL #4   Title  Pt will ambulate >1000' over varying outdoor surfaces at mod I level in order to indicate return to community leisure activity.             Plan - 11/28/19 1909    Clinical Impression Statement  Pt  presents s/p thalamic CVA on 12/15 (hospitalized until 12/17) with L sided weakness.  Note history of BPH, DMII, HTN, HLD, and CKD IV.  Upon PT evaluation (limited due to pt being late) note gait speed is 3.30 ft/sec without device but tends to have forward trunk at times with mild staggering to the R, 5TSS time of 9.97 secs WFL and FGA of 22/30 indicative of medium fall risk.  Pt will benefit from skilled OP neuro in order to address deficits.    Personal Factors and Comorbidities  Age;Comorbidity 3+    Comorbidities  see above    Examination-Activity Limitations  Stairs;Squat   gait   Examination-Participation Restrictions  Community Activity;School;Driving    Stability/Clinical Decision Making  Stable/Uncomplicated    Clinical Decision Making  Low    Rehab Potential  Good    PT Frequency  1x / week    PT Duration  8 weeks    PT Treatment/Interventions  ADLs/Self Care Home Management;DME Instruction;Gait training;Stair training;Functional mobility training;Therapeutic activities;Therapeutic exercise;Balance training;Neuromuscular re-education;Vestibular    PT Next Visit Plan  Establish high level balance HEP, postural exercises, assess gait over uneven surfaces (outdoor), stairs    Consulted and Agree with Plan of Care  Patient       Patient will benefit from skilled therapeutic intervention in order to improve the following deficits and impairments:  Abnormal gait, Decreased activity tolerance, Decreased balance, Postural dysfunction, Decreased mobility  Visit Diagnosis: Unsteadiness on feet  Other abnormalities of gait and mobility  Abnormal posture     Problem List Patient Active Problem List   Diagnosis Date Noted  . Acute CVA (cerebrovascular accident) ([place]) 10/08/2019  . Type 2 diabetes mellitus ([place]) 10/08/2019  . Hyperlipidemia 10/08/2019  . BPH (benign prostatic hyperplasia) 10/08/2019  . Hypertension 10/08/2019  . ARF (acute renal failure) ([place]) 05/08/2019    Cameron Sprang, PT, MPT [facility] [address] [place], [place], [address] Phone: [phone]   Fax:  [phone] 11/28/19, 7:19 PM  Name: Erik Moss MRN: [id number] Date of Birth: 1933/04/14

## 2019-12-04 ENCOUNTER — Other Ambulatory Visit: Payer: Self-pay

## 2019-12-04 ENCOUNTER — Ambulatory Visit: Payer: Medicare HMO

## 2019-12-04 DIAGNOSIS — R293 Abnormal posture: Secondary | ICD-10-CM | POA: Diagnosis not present

## 2019-12-04 DIAGNOSIS — R2689 Other abnormalities of gait and mobility: Secondary | ICD-10-CM | POA: Diagnosis not present

## 2019-12-04 DIAGNOSIS — R2681 Unsteadiness on feet: Secondary | ICD-10-CM | POA: Diagnosis not present

## 2019-12-04 NOTE — Therapy (Signed)
[facility] [address] [place], [place], [address] Phone: [phone]   Fax:  [phone]  Physical Therapy Treatment  Patient Details  Name: Erik Moss MRN: [id number] Date of Birth: 1932-10-26 Referring Provider (PT): Jenny Reichmann, MD   Encounter Date: 12/04/2019  PT End of Session - 12/04/19 1800    Visit Number  2    Number of Visits  9    Date for PT Re-Evaluation  01/27/20    Authorization Type  [employer] Medicare -10th visit PN needed    PT Start Time  1757    PT Stop Time  1836    PT Time Calculation (min)  39 min    Activity Tolerance  Patient tolerated treatment well    Behavior During Therapy  [facility] for tasks assessed/performed       Past Medical History:  Diagnosis Date  . BPH (benign prostatic hyperplasia) 10/08/2019  . Diabetes mellitus without complication ([place])   . Hyperlipidemia 10/08/2019  . Hypertension 10/08/2019  . Stroke ([place])   . Type 2 diabetes mellitus ([place]) 10/08/2019    Past Surgical History:  Procedure Laterality Date  . BUBBLE STUDY  10/10/2019   Procedure: BUBBLE STUDY;  Surgeon: Geralynn Rile, MD;  Location: [facility];  Service: Cardiology;;  . TEE WITHOUT CARDIOVERSION N/A 10/10/2019   Procedure: TRANSESOPHAGEAL ECHOCARDIOGRAM (TEE);  Surgeon: Geralynn Rile, MD;  Location: [facility];  Service: Cardiology;  Laterality: N/A;    There were no vitals filed for this visit.  Subjective Assessment - 12/04/19 1759    Subjective  Pt reports that he is doing well.    Pertinent History  BPH, DMII, HLD, CKD IV    Limitations  Walking    Patient Stated Goals  "I think I'm doing ok, but I want to get back to walking"    Currently in Pain?  No/denies                       [facility] Adult PT Treatment/Exercise - 12/04/19 1800      Ambulation/Gait   Ambulation/Gait  Yes    Ambulation/Gait Assistance  5: Supervision    Ambulation/Gait Assistance  Details  Pt with noted slight lean to the right. Also was not showing arm swing with arms behind body. When cued this improved with more upright posture.    Ambulation Distance (Feet)  575 Feet    Assistive device  None    Gait Pattern  Lateral trunk lean to right;Trunk flexed;Step-through pattern    Ambulation Surface  Level;Indoor    Stairs  --    Ramp  5: Supervision    Ramp Details (indicate cue type and reason)  Pt had 1 LOB to right touching wall when turning at top of ramp. Pt was instructed to take his time with turns. Performed x 2.     Curb  5: Supervision    Curb Details (indicate cue type and reason)  Pt was cued to be sure to clear feet as caught left heel slightly on edge of step when stepping down. Performed x 2.       Neuro Re-ed    Neuro Re-ed Details   Obstacle course for gait stepping over 5 cones with tapping cone first for increased SLS time, marching over mat, reciprocal steps over 4 hurdles CGA/min assist. Pt unsteady with tapping cones and stepping over. Along counter: with light UE support tapping cone then stepping over 3 cones x  6, marching gait 6' x 6, backwards gait 6' x 6 with occasional light UE support and verbal cues for upright posture, tandem gait with light UE support 6' x 6, side stepping without UE support 6' x 6.  BP=142/72 after             PT Education - 12/04/19 2042    Education Details  Pt was started on dynamic balance exercises at counter. Instructed to take his time when turning.    Person(s) Educated  Patient    Methods  Explanation;Demonstration;Handout    Comprehension  Verbalized understanding;Returned demonstration       PT Short Term Goals - 11/28/19 1914      PT SHORT TERM GOAL #1   Title  Pt will be IND with initial HEP (Target Date: 12/28/19)    Time  4    Period  Weeks    Status  New    Target Date  12/28/19      PT SHORT TERM GOAL #2   Title  Pt will improve FGA to >/=25/30 in order to indicate decreased fall risk.       PT SHORT TERM GOAL #3   Title  Pt will perform stairs without rail in alternating pattern at mod I level in order to indicate improved strength/balance.        PT Long Term Goals - 11/28/19 1916      PT LONG TERM GOAL #1   Title  Pt will be IND with final HEP for balance/endurance/strength. (Target Date: 01/27/20)    Time  8    Period  Weeks    Status  New    Target Date  01/27/20      PT LONG TERM GOAL #2   Title  Pt will improve FGA to >/=28/30 in order to indicate decreased fall risk.      PT LONG TERM GOAL #3   Title  Pt will ambulate at gait speed of 3.30 ft/sec or better with decreased staggering and upright posture to indicate decreased fall risk.      PT LONG TERM GOAL #4   Title  Pt will ambulate >1000' over varying outdoor surfaces at mod I level in order to indicate return to community leisure activity.            Plan - 12/04/19 2043    Clinical Impression Statement  Pt needed some cues to slow down at times as presents with right trunk lean in gait with mild staggering to right. Pt was challenged with SLS part of activities. PT established initial dynamic gait activities along counter.    Personal Factors and Comorbidities  Age;Comorbidity 3+    Comorbidities  see above    Examination-Activity Limitations  Stairs;Squat   gait   Examination-Participation Restrictions  Community Activity;School;Driving    Stability/Clinical Decision Making  Stable/Uncomplicated    Rehab Potential  Good    PT Frequency  1x / week    PT Duration  8 weeks    PT Treatment/Interventions  ADLs/Self Care Home Management;DME Instruction;Gait training;Stair training;Functional mobility training;Therapeutic activities;Therapeutic exercise;Balance training;Neuromuscular re-education;Vestibular    PT Next Visit Plan  How are balance exercises going at counter? postural exercises, assess gait over uneven surfaces (outdoor), stairs    Consulted and Agree with Plan of Care  Patient        Patient will benefit from skilled therapeutic intervention in order to improve the following deficits and impairments:  Abnormal gait, Decreased activity tolerance, Decreased balance,  Postural dysfunction, Decreased mobility  Visit Diagnosis: Other abnormalities of gait and mobility  Abnormal posture     Problem List Patient Active Problem List   Diagnosis Date Noted  . Acute CVA (cerebrovascular accident) ([place]) 10/08/2019  . Type 2 diabetes mellitus ([place]) 10/08/2019  . Hyperlipidemia 10/08/2019  . BPH (benign prostatic hyperplasia) 10/08/2019  . Hypertension 10/08/2019  . ARF (acute renal failure) ([place]) 05/08/2019    Electa Sniff, PT, DPT, NCS 12/04/2019, 8:46 PM  [facility] [address] [place], [place], [address] Phone: [phone]   Fax:  [phone]  Name: Erik Moss MRN: [id number] Date of Birth: 05-13-1933

## 2019-12-04 NOTE — Patient Instructions (Signed)
Access Code: [email or web address] URL: [email or web address] Date: 12/04/2019 Prepared by: Cherly Anderson  Exercises Walking March - 4 reps - 1 sets - 2x daily - 7x weekly Tandem Walking with Counter Support - 4 reps - 1 sets - 2x daily - 7x weekly Backward Walking with Counter Support - 4 reps - 1 sets - 2x daily - 7x weekly Side Stepping with Counter Support - 4 reps - 3 sets - 2x daily - 7x weekly

## 2019-12-05 ENCOUNTER — Ambulatory Visit: Payer: Medicare HMO | Attending: Internal Medicine

## 2019-12-05 DIAGNOSIS — Z23 Encounter for immunization: Secondary | ICD-10-CM | POA: Insufficient documentation

## 2019-12-05 NOTE — Progress Notes (Signed)
   Covid-19 Vaccination Clinic  Name:  Erik Moss    MRN: [id number] DOB: 1932/11/04  12/05/2019  Erik Moss was observed post Covid-19 immunization for 15 minutes without incidence. He was provided with Vaccine Information Sheet and instruction to access the V-Safe system.   Erik Moss was instructed to call 911 with any severe reactions post vaccine: Marland Kitchen Difficulty breathing  . Swelling of your face and throat  . A fast heartbeat  . A bad rash all over your body  . Dizziness and weakness    Immunizations Administered    Name Date Dose VIS Date Route   Pfizer COVID-19 Vaccine 12/05/2019  2:04 PM 0.3 mL 10/04/2019 Intramuscular   Manufacturer: [place]   Lot: [id number]   [place]: [id number]

## 2019-12-16 DIAGNOSIS — N183 Chronic kidney disease, stage 3 unspecified: Secondary | ICD-10-CM | POA: Diagnosis not present

## 2019-12-18 ENCOUNTER — Other Ambulatory Visit: Payer: Self-pay

## 2019-12-18 ENCOUNTER — Ambulatory Visit: Payer: Medicare HMO | Admitting: Physical Therapy

## 2019-12-18 DIAGNOSIS — R293 Abnormal posture: Secondary | ICD-10-CM

## 2019-12-18 DIAGNOSIS — R2689 Other abnormalities of gait and mobility: Secondary | ICD-10-CM

## 2019-12-18 DIAGNOSIS — R2681 Unsteadiness on feet: Secondary | ICD-10-CM

## 2019-12-18 NOTE — Therapy (Signed)
[facility] [address] [place], [place], [address] Phone: [phone]   Fax:  [phone]  Physical Therapy Treatment  Patient Details  Name: Erik Moss MRN: [id number] Date of Birth: Apr 01, 1933 Referring Provider (PT): Jenny Reichmann, MD   Encounter Date: 12/18/2019  PT End of Session - 12/18/19 [id number]    Visit Number  3    Number of Visits  9    Date for PT Re-Evaluation  01/27/20    Authorization Type  [employer] Medicare -10th visit PN needed    PT Start Time  1705    PT Stop Time  1745    PT Time Calculation (min)  40 min    Activity Tolerance  Patient tolerated treatment well    Behavior During Therapy  [facility] for tasks assessed/performed       Past Medical History:  Diagnosis Date  . BPH (benign prostatic hyperplasia) 10/08/2019  . Diabetes mellitus without complication ([place])   . Hyperlipidemia 10/08/2019  . Hypertension 10/08/2019  . Stroke ([place])   . Type 2 diabetes mellitus ([place]) 10/08/2019    Past Surgical History:  Procedure Laterality Date  . BUBBLE STUDY  10/10/2019   Procedure: BUBBLE STUDY;  Surgeon: Geralynn Rile, MD;  Location: [facility];  Service: Cardiology;;  . TEE WITHOUT CARDIOVERSION N/A 10/10/2019   Procedure: TRANSESOPHAGEAL ECHOCARDIOGRAM (TEE);  Surgeon: Geralynn Rile, MD;  Location: [place] [facility];  Service: Cardiology;  Laterality: N/A;    There were no vitals filed for this visit.  Subjective Assessment - 12/18/19 1706    Subjective  No changes, no falls.  I have been doing some walking.    Pertinent History  BPH, DMII, HLD, CKD IV    Limitations  Walking    Patient Stated Goals  "I think I'm doing ok, but I want to get back to walking"    Currently in Pain?  No/denies                       [facility] Adult PT Treatment/Exercise - 12/18/19 0001      Ambulation/Gait   Ambulation/Gait  Yes    Ambulation/Gait Assistance  5: Supervision     Ambulation/Gait Assistance Details  Cues for upright posture and for improved foot clearance/heelstrike on indoor/outdoor surfaces.  With descending ramp on sidewalk, cues for posture and heelsrike for improved control.  Indoor gait with environmental scanning, occasional cues for foot clearance.    Ambulation Distance (Feet)  400 Feet   outdoors, 430 indoors   Assistive device  None    Gait Pattern  Lateral trunk lean to right;Trunk flexed;Step-through pattern    Ambulation Surface  Level;Indoor    Stairs  Yes    Stairs Assistance  6: Modified independent (Device/Increase time)   Cues for full foot placement ascending, foot clearance desc   Stair Management Technique  One rail Right;Forwards;Alternating pattern    Number of Stairs  4   x 4 reps   Height of Stairs  6        Neuro Re-education -Review of HEP-pt performs initially near counter no UE support, with several episodes of LOB Walking March - 1 lap forward/back Tandem Walking with Counter Support - 1 lap forward/back Backward Walking with Counter Support - 1 lap (forwards/back walking) Side Stepping with Counter Support - 1 lap R and L  -Performed second set of the above exercises, with cues for UE support as needed, look ahead at visual target  and slowed pace -Performed additional 2-3 sets of each exercise above, on blue compliant mat surface with UE support  Performed alternating step taps at 6" step, then forward step up/up, down/down x 10 reps with UE support   Balance Exercises - 12/18/19 1717      Balance Exercises: Standing   Standing Eyes Opened  Wide (BOA);Narrow base of support (BOS);Foam/compliant surface;Head turns;5 reps   Head nods   Standing Eyes Closed  Wide (BOA);Narrow base of support (BOS);Foam/compliant surface;Head turns;5 reps   Head nods; EC 10 sec x 1 each foot position   Marching  Foam/compliant surface;Upper extremity assist 1;10 reps   2 sets; marching in place   Other Standing Exercises  On  [employer]:  alternating forward kicks x 10 reps, alternating forward step taps x 10 reps with UE support.  Standing on blue mat on incline/decline of ramp:  marching in place x 10 reps, alternating forward step taps x 10 reps, feet wide BOS with EO head turns x 5, head nods x 5 with min guard assistance.    Other Standing Exercises Comments  Along counter, heel walking x 4 reps, toe walking x 4 reps, 1 UE support at counter.          PT Short Term Goals - 11/28/19 1914      PT SHORT TERM GOAL #1   Title  Pt will be IND with initial HEP (Target Date: 12/28/19)    Time  4    Period  Weeks    Status  New    Target Date  12/28/19      PT SHORT TERM GOAL #2   Title  Pt will improve FGA to >/=25/30 in order to indicate decreased fall risk.      PT SHORT TERM GOAL #3   Title  Pt will perform stairs without rail in alternating pattern at mod I level in order to indicate improved strength/balance.        PT Long Term Goals - 11/28/19 1916      PT LONG TERM GOAL #1   Title  Pt will be IND with final HEP for balance/endurance/strength. (Target Date: 01/27/20)    Time  8    Period  Weeks    Status  New    Target Date  01/27/20      PT LONG TERM GOAL #2   Title  Pt will improve FGA to >/=28/30 in order to indicate decreased fall risk.      PT LONG TERM GOAL #3   Title  Pt will ambulate at gait speed of 3.30 ft/sec or better with decreased staggering and upright posture to indicate decreased fall risk.      PT LONG TERM GOAL #4   Title  Pt will ambulate >1000' over varying outdoor surfaces at mod I level in order to indicate return to community leisure activity.            Plan - 12/18/19 1754    Clinical Impression Statement  With review of balance exercises, PT provides cues for upright posture and visual target, and cues to slow down at times.  With activities on compliant surfaces in clinic as well as gait on sidewalk outdoors, pt requires cues for improved foot clearance.  Pt will  continue to benefit from skilled PT to further address high level balance and gait.    Personal Factors and Comorbidities  Age;Comorbidity 3+    Comorbidities  see above    Examination-Activity Limitations  Stairs;Squat   gait   Examination-Participation Restrictions  Community Activity;School;Driving    Stability/Clinical Decision Making  Stable/Uncomplicated    Rehab Potential  Good    PT Frequency  1x / week    PT Duration  8 weeks    PT Treatment/Interventions  ADLs/Self Care Home Management;DME Instruction;Gait training;Stair training;Functional mobility training;Therapeutic activities;Therapeutic exercise;Balance training;Neuromuscular re-education;Vestibular    PT Next Visit Plan  Progress HEP as needed; continue compliant surface and dynamic balance exercises; postural surfaces and gait on unlevel surfaces    Consulted and Agree with Plan of Care  Patient       Patient will benefit from skilled therapeutic intervention in order to improve the following deficits and impairments:  Abnormal gait, Decreased activity tolerance, Decreased balance, Postural dysfunction, Decreased mobility  Visit Diagnosis: Other abnormalities of gait and mobility  Unsteadiness on feet  Abnormal posture     Problem List Patient Active Problem List   Diagnosis Date Noted  . Acute CVA (cerebrovascular accident) ([place]) 10/08/2019  . Type 2 diabetes mellitus ([place]) 10/08/2019  . Hyperlipidemia 10/08/2019  . BPH (benign prostatic hyperplasia) 10/08/2019  . Hypertension 10/08/2019  . ARF (acute renal failure) ([place]) 05/08/2019    Kasra Melvin W. 12/18/2019, 5:59 PM  Frazier Butt., PT  [facility] [address] [place], [place], [address] Phone: [phone]   Fax:  [phone]  Name: Erik Moss MRN: [id number] Date of Birth: 1933/09/12

## 2019-12-23 DIAGNOSIS — E872 Acidosis: Secondary | ICD-10-CM | POA: Diagnosis not present

## 2019-12-23 DIAGNOSIS — N1832 Chronic kidney disease, stage 3b: Secondary | ICD-10-CM | POA: Diagnosis not present

## 2019-12-23 DIAGNOSIS — N281 Cyst of kidney, acquired: Secondary | ICD-10-CM | POA: Diagnosis not present

## 2019-12-23 DIAGNOSIS — E1122 Type 2 diabetes mellitus with diabetic chronic kidney disease: Secondary | ICD-10-CM | POA: Diagnosis not present

## 2019-12-23 DIAGNOSIS — N400 Enlarged prostate without lower urinary tract symptoms: Secondary | ICD-10-CM | POA: Diagnosis not present

## 2019-12-23 DIAGNOSIS — D631 Anemia in chronic kidney disease: Secondary | ICD-10-CM | POA: Diagnosis not present

## 2019-12-23 DIAGNOSIS — I129 Hypertensive chronic kidney disease with stage 1 through stage 4 chronic kidney disease, or unspecified chronic kidney disease: Secondary | ICD-10-CM | POA: Diagnosis not present

## 2019-12-25 ENCOUNTER — Other Ambulatory Visit: Payer: Self-pay | Admitting: Nephrology

## 2019-12-25 DIAGNOSIS — N1832 Chronic kidney disease, stage 3b: Secondary | ICD-10-CM

## 2019-12-26 ENCOUNTER — Ambulatory Visit: Payer: Medicare HMO | Attending: Internal Medicine | Admitting: Rehabilitation

## 2019-12-26 ENCOUNTER — Encounter: Payer: Self-pay | Admitting: Rehabilitation

## 2019-12-26 ENCOUNTER — Other Ambulatory Visit: Payer: Self-pay

## 2019-12-26 DIAGNOSIS — R293 Abnormal posture: Secondary | ICD-10-CM

## 2019-12-26 DIAGNOSIS — R2689 Other abnormalities of gait and mobility: Secondary | ICD-10-CM

## 2019-12-26 DIAGNOSIS — R2681 Unsteadiness on feet: Secondary | ICD-10-CM | POA: Insufficient documentation

## 2019-12-26 NOTE — Therapy (Signed)
[facility] [address] [place], [place], [address] Phone: [phone]   Fax:  [phone]  Physical Therapy Treatment  Patient Details  Name: Erik Moss MRN: [id number] Date of Birth: 12-30-1932 Referring Provider (PT): Jenny Reichmann, MD   Encounter Date: 12/26/2019  PT End of Session - 12/26/19 1910    Visit Number  4    Number of Visits  9    Date for PT Re-Evaluation  01/27/20    Authorization Type  [employer] Medicare -10th visit PN needed    PT Start Time  1706    PT Stop Time  1748    PT Time Calculation (min)  42 min    Activity Tolerance  Patient tolerated treatment well    Behavior During Therapy  [facility] for tasks assessed/performed       Past Medical History:  Diagnosis Date  . BPH (benign prostatic hyperplasia) 10/08/2019  . Diabetes mellitus without complication ([place])   . Hyperlipidemia 10/08/2019  . Hypertension 10/08/2019  . Stroke ([place])   . Type 2 diabetes mellitus ([place]) 10/08/2019    Past Surgical History:  Procedure Laterality Date  . BUBBLE STUDY  10/10/2019   Procedure: BUBBLE STUDY;  Surgeon: Geralynn Rile, MD;  Location: [facility];  Service: Cardiology;;  . TEE WITHOUT CARDIOVERSION N/A 10/10/2019   Procedure: TRANSESOPHAGEAL ECHOCARDIOGRAM (TEE);  Surgeon: Geralynn Rile, MD;  Location: [facility];  Service: Cardiology;  Laterality: N/A;    There were no vitals filed for this visit.  Subjective Assessment - 12/26/19 1709    Subjective  No changes, walking 1 mile every day to every other day.    Pertinent History  BPH, DMII, HLD, CKD IV    Limitations  Walking    Patient Stated Goals  "I think I'm doing ok, but I want to get back to walking"    Currently in Pain?  No/denies         [facility] PT Assessment - 12/26/19 1711      Functional Gait  Assessment   Gait assessed   Yes    Gait Level Surface  Walks 20 ft in less than 5.5 sec, no assistive devices, good  speed, no evidence for imbalance, normal gait pattern, deviates no more than 6 in outside of the 12 in walkway width.   5.19 secs    Change in Gait Speed  Able to smoothly change walking speed without loss of balance or gait deviation. Deviate no more than 6 in outside of the 12 in walkway width.    Gait with Horizontal Head Turns  Performs head turns smoothly with slight change in gait velocity (eg, minor disruption to smooth gait path), deviates 6-10 in outside 12 in walkway width, or uses an assistive device.    Gait with Vertical Head Turns  Performs head turns with no change in gait. Deviates no more than 6 in outside 12 in walkway width.    Gait and Pivot Turn  Pivot turns safely within 3 sec and stops quickly with no loss of balance.    Step Over Obstacle  Is able to step over 2 stacked shoe boxes taped together (9 in total height) without changing gait speed. No evidence of imbalance.    Gait with Narrow Base of Support  Is able to ambulate for 10 steps heel to toe with no staggering.    Gait with Eyes Closed  Walks 20 ft, uses assistive device, slower speed, mild gait deviations, deviates  6-10 in outside 12 in walkway width. Ambulates 20 ft in less than 9 sec but greater than 7 sec.    Ambulating Backwards  Walks 20 ft, no assistive devices, good speed, no evidence for imbalance, normal gait    Steps  Alternating feet, no rail.    Total Score  28                   [facility] Adult PT Treatment/Exercise - 12/26/19 0001      Neuro Re-ed    Neuro Re-ed Details   Went through HEP and added corner balance, see HEP in pt instruction.        Access Code: [email or web address] URL: [email or web address] Date: 12/26/2019 Prepared by: Cameron Sprang  Exercises Walking March - 4 reps - 1 sets - 2x daily - 7x weekly Tandem Walking with Counter Support - 4 reps - 1 sets - 2x daily - 7x weekly Backward Walking with Counter Support - 4 reps - 1 sets - 2x daily - 7x weekly Side Stepping  with Counter Support - 4 reps - 3 sets - 2x daily - 7x weekly Romberg Stance Eyes Closed on Foam Pad - 3 reps - 1 sets - 20 secs hold - 1x daily - 7x weekly Romberg Stance on Foam Pad with Head Rotation - 10 reps - 1 sets - 1x daily - 7x weekly Romberg Stance with Head Nods on Foam Pad - 10 reps - 3 sets - 1x daily - 7x weekly Romberg Stance with Head Nods on Foam Pad - 10 reps - 1 sets - 1x daily - 7x weekly       PT Education - 12/26/19 1910    Education Details  additional HEP exercises    Person(s) Educated  Patient    Methods  Explanation;Demonstration    Comprehension  Verbalized understanding       PT Short Term Goals - 12/26/19 1710      PT SHORT TERM GOAL #1   Title  Pt will be IND with initial HEP (Target Date: 12/28/19)    Baseline  Pt needs cuing but is doing at home    Time  4    Period  Weeks    Status  Partially Met    Target Date  12/28/19      PT SHORT TERM GOAL #2   Title  Pt will improve FGA to >/=25/30 in order to indicate decreased fall risk.    Baseline  28/30 on 12/26/19    Status  Achieved      PT SHORT TERM GOAL #3   Title  Pt will perform stairs without rail in alternating pattern at mod I level in order to indicate improved strength/balance.    Baseline  met 12/26/19    Status  Achieved        PT Long Term Goals - 12/26/19 1911      PT LONG TERM GOAL #1   Title  Pt will be IND with final HEP for balance/endurance/strength. (Target Date: 01/27/20)    Time  8    Period  Weeks    Status  New      PT LONG TERM GOAL #2   Title  Pt will improve FGA to >/=28/30 in order to indicate decreased fall risk.      PT LONG TERM GOAL #3   Title  Pt will ambulate at gait speed of 3.30 ft/sec or better with decreased staggering and upright posture to indicate decreased fall  risk.      PT LONG TERM GOAL #4   Title  Pt will ambulate >1000' over varying outdoor surfaces at mod I level in order to indicate return to community leisure activity.             Plan - 12/26/19 1911    Clinical Impression Statement  Skilled session focused on assessment of STGs.  Pt has met 2/3 STGs, partially meeting HEP goal as he is performing at home but needs cues for correct technique.  Pt still has difficulty with SLS and compliant surfaces, therefore added corner balance exercises to HEP.    Personal Factors and Comorbidities  Age;Comorbidity 3+    Comorbidities  see above    Examination-Activity Limitations  Stairs;Squat   gait   Examination-Participation Restrictions  Community Activity;School;Driving    Stability/Clinical Decision Making  Stable/Uncomplicated    Rehab Potential  Good    PT Frequency  1x / week    PT Duration  8 weeks    PT Treatment/Interventions  ADLs/Self Care Home Management;DME Instruction;Gait training;Stair training;Functional mobility training;Therapeutic activities;Therapeutic exercise;Balance training;Neuromuscular re-education;Vestibular    PT Next Visit Plan  Continue high level balance on compliant surfaces, unlevel surfaces, SLS    Consulted and Agree with Plan of Care  Patient       Patient will benefit from skilled therapeutic intervention in order to improve the following deficits and impairments:  Abnormal gait, Decreased activity tolerance, Decreased balance, Postural dysfunction, Decreased mobility  Visit Diagnosis: Other abnormalities of gait and mobility  Unsteadiness on feet  Abnormal posture     Problem List Patient Active Problem List   Diagnosis Date Noted  . Acute CVA (cerebrovascular accident) ([place]) 10/08/2019  . Type 2 diabetes mellitus ([place]) 10/08/2019  . Hyperlipidemia 10/08/2019  . BPH (benign prostatic hyperplasia) 10/08/2019  . Hypertension 10/08/2019  . ARF (acute renal failure) ([place]) 05/08/2019    Cameron Sprang, PT, MPT [facility] [address] [place], [place], [address] Phone: [phone]   Fax:  [phone] 12/26/19, 7:14  PM  Name: Erik Moss MRN: [id number] Date of Birth: [birth year]

## 2019-12-26 NOTE — Patient Instructions (Signed)
Access Code: [email or web address] URL: [email or web address] Date: 12/26/2019 Prepared by: Cameron Sprang  Exercises Walking March - 4 reps - 1 sets - 2x daily - 7x weekly Tandem Walking with Counter Support - 4 reps - 1 sets - 2x daily - 7x weekly Backward Walking with Counter Support - 4 reps - 1 sets - 2x daily - 7x weekly Side Stepping with Counter Support - 4 reps - 3 sets - 2x daily - 7x weekly Romberg Stance Eyes Closed on Foam Pad - 3 reps - 1 sets - 20 secs hold - 1x daily - 7x weekly Romberg Stance on Foam Pad with Head Rotation - 10 reps - 1 sets - 1x daily - 7x weekly Romberg Stance with Head Nods on Foam Pad - 10 reps - 3 sets - 1x daily - 7x weekly Romberg Stance with Head Nods on Foam Pad - 10 reps - 1 sets - 1x daily - 7x weekly

## 2019-12-31 ENCOUNTER — Ambulatory Visit
Admission: RE | Admit: 2019-12-31 | Discharge: 2019-12-31 | Disposition: A | Discharge status: Home / Self Care | Payer: Medicare HMO | Source: Ambulatory Visit | Attending: Nephrology | Admitting: Nephrology

## 2019-12-31 ENCOUNTER — Other Ambulatory Visit: Payer: Self-pay

## 2019-12-31 DIAGNOSIS — N281 Cyst of kidney, acquired: Secondary | ICD-10-CM | POA: Diagnosis not present

## 2019-12-31 DIAGNOSIS — N1832 Chronic kidney disease, stage 3b: Secondary | ICD-10-CM

## 2019-12-31 DIAGNOSIS — N183 Chronic kidney disease, stage 3 unspecified: Secondary | ICD-10-CM | POA: Diagnosis not present

## 2019-12-31 IMAGING — US US RENAL
1 series · 13 of 25 positions shown · non-contrast
Comparison: Prior ultrasound from [DATE].

CLINICAL DATA: Initial evaluation for stage III B chronic kidney
disease.

EXAM:
RENAL / URINARY TRACT ULTRASOUND COMPLETE

[Series 1: us renal · 0.22mm/px · 13 of 44 slices shown]
[im 1/44]
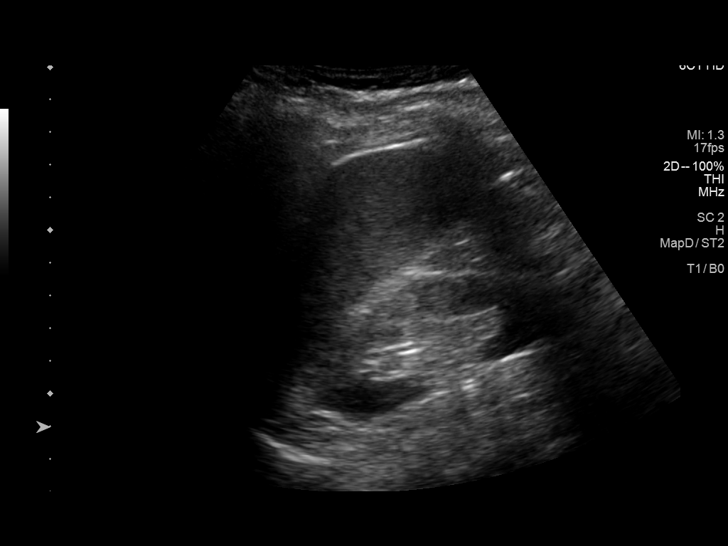
[im 4/44]
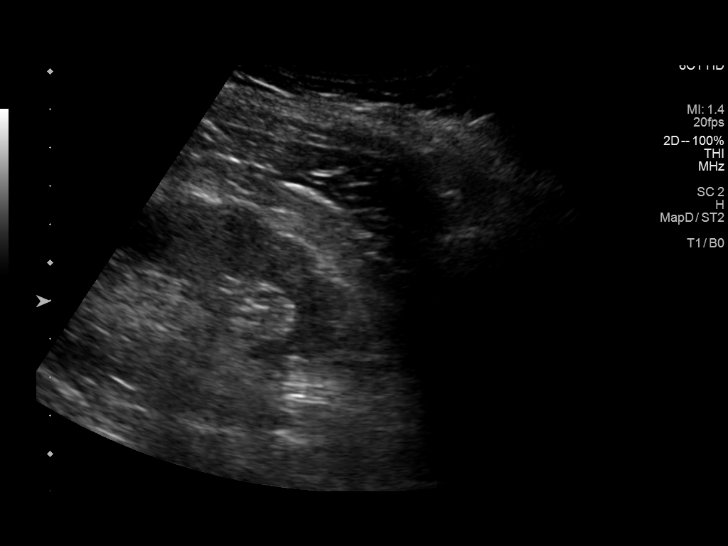
[im 8/44]
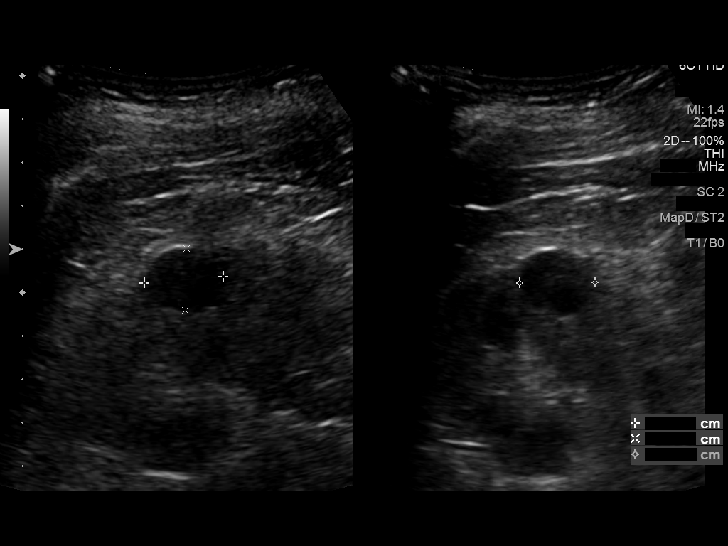
[im 11/44]
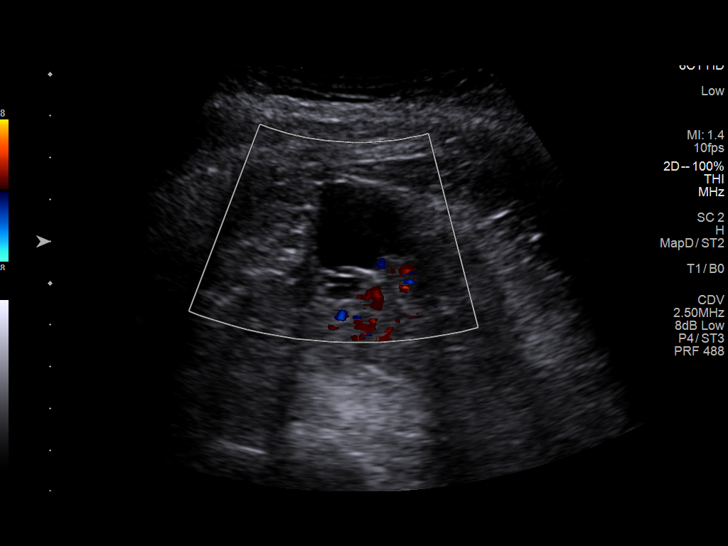
[im 15/44]
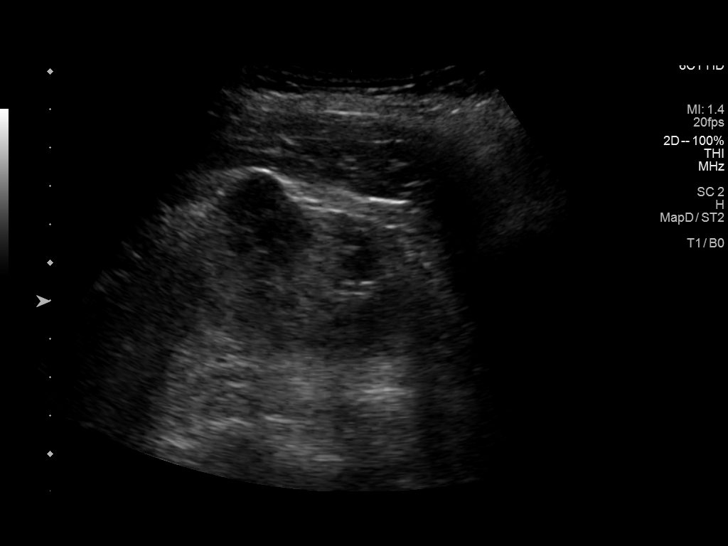
[im 18/44]
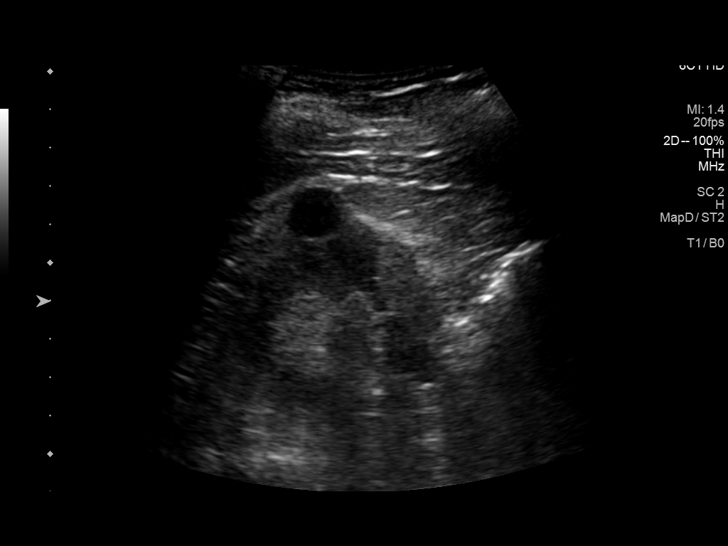
[im 22/44]
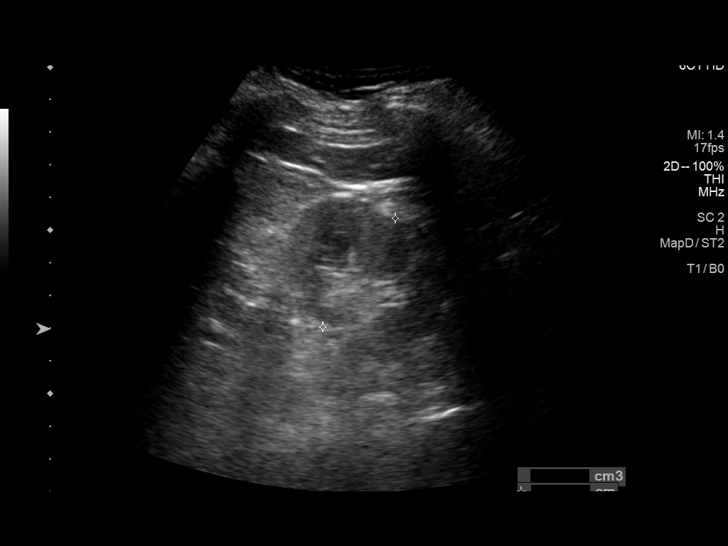
[im 26/44]
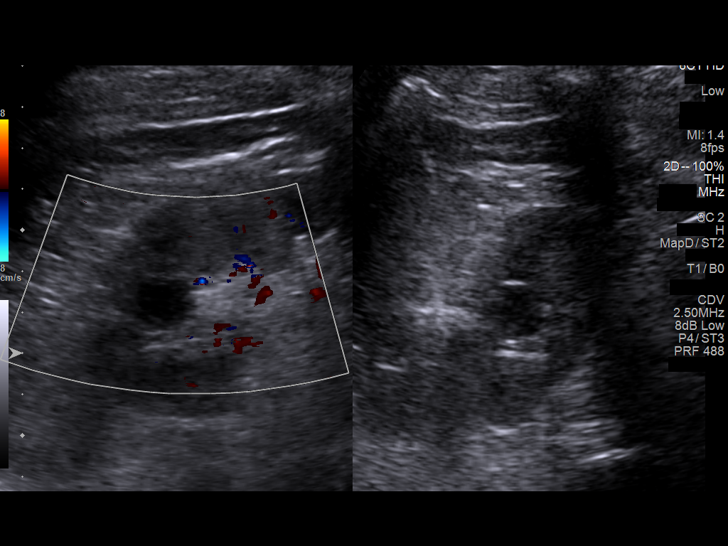
[im 29/44]
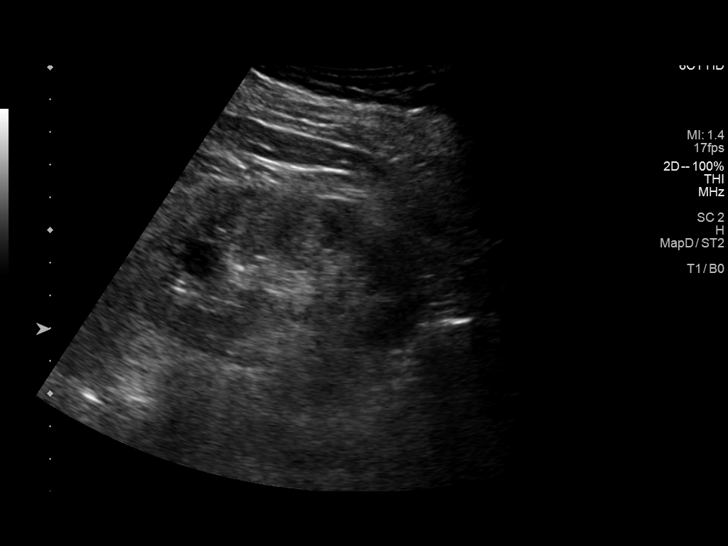
[im 33/44]
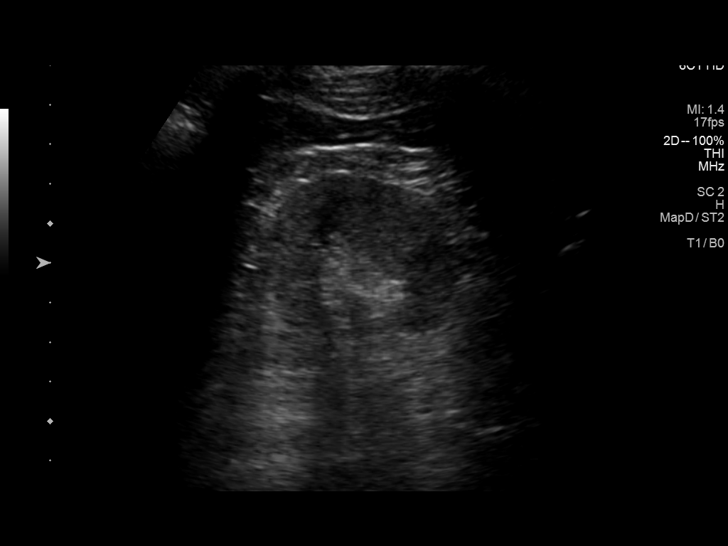
[im 36/44]
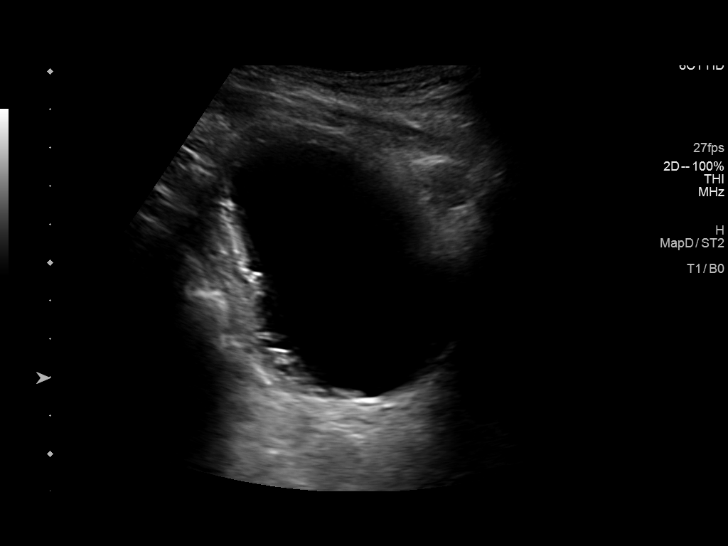
[im 40/44]
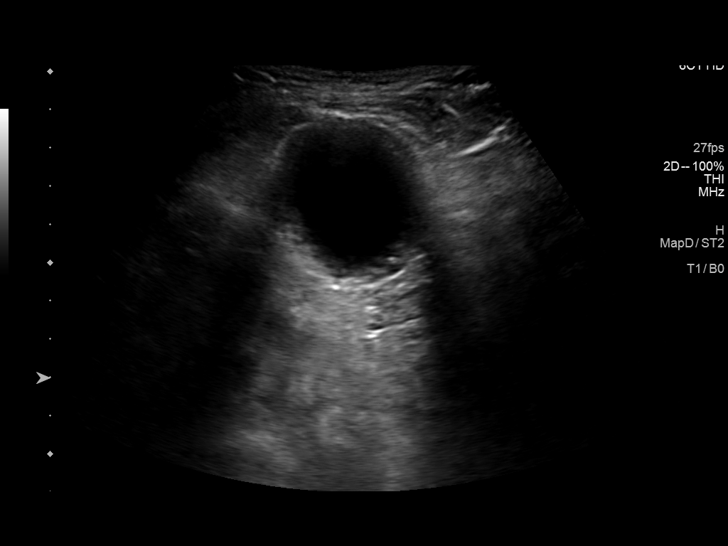
[im 44/44]
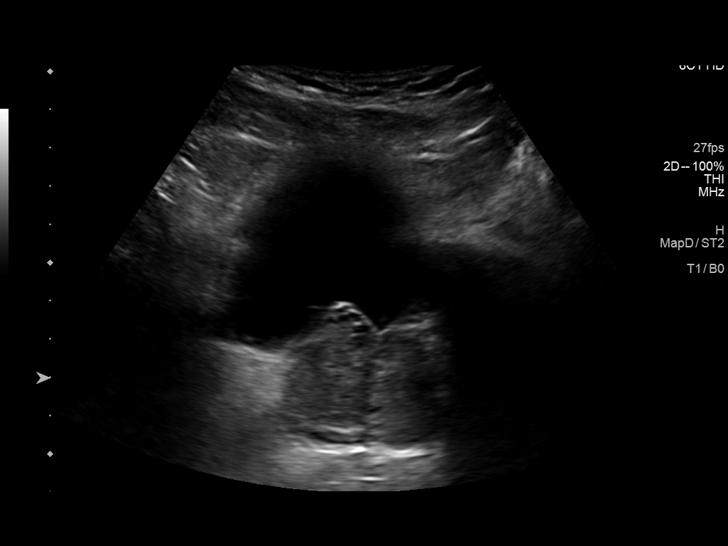

[13 of 25 positions shown; findings below may reference images not displayed]

FINDINGS: Right Kidney:

Renal measurements: 8.6 x 4.6 x 4.8 cm = volume: 99 mL. Increased
echogenicity seen within the renal parenchyma. No nephrolithiasis or
hydronephrosis. 2.4 x 1.6 x 2.2 cm simple cyst present at the upper
pole. 1.8 x 1.4 x 1.7 cm simple cyst present at the interpolar
region. 1.6 x 1.4 x 2.5 cm complex cyst with a few internal
septations at the interpolar region seen, relatively stable from
prior ultrasound (previously measuring up to 2.6 cm).

Left Kidney:

Renal measurements: 9.4 x 6.1 x 4.0 cm = volume: 121 mL. Increased
echogenicity within the renal parenchyma. No nephrolithiasis or
hydronephrosis. 1.8 x 1.3 x 1.4 cm simple cyst present at the upper
pole.

Bladder:

Small amount of echogenic debris and/or septation noted within the
bladder lumen.

Other:

Enlarged nodular prostate invaginate upon the base of the bladder,
measuring 45 cc.
IMPRESSION: 1. Increased echogenicity within the renal parenchyma, consistent
with medical renal disease.
2. No hydronephrosis.
3. Bilateral renal cysts as detailed above. Largest cyst within the
right kidney measures 2.5 cm and is complex in appearance with
internal septations, not significantly changed as compared to
[DATE]. Given stability, this likely reflects a benign complex
cyst. Six
4. Layering echogenic material and/or debris with possible
septations within the bladder. Correlation with urinalysis
recommended.
5. Enlarged prostate.

## 2020-01-01 ENCOUNTER — Ambulatory Visit: Payer: Medicare HMO | Admitting: Physical Therapy

## 2020-01-01 ENCOUNTER — Other Ambulatory Visit: Payer: Self-pay

## 2020-01-01 DIAGNOSIS — R293 Abnormal posture: Secondary | ICD-10-CM | POA: Diagnosis not present

## 2020-01-01 DIAGNOSIS — R2681 Unsteadiness on feet: Secondary | ICD-10-CM

## 2020-01-01 DIAGNOSIS — R2689 Other abnormalities of gait and mobility: Secondary | ICD-10-CM

## 2020-01-01 NOTE — Therapy (Signed)
[facility] [address] [place], [place], [address] Phone: [phone]   Fax:  [phone]  Physical Therapy Treatment  Patient Details  Name: Erik Moss MRN: [id number] Date of Birth: Mar 18, 1933 Referring Provider (PT): Jenny Reichmann, MD   Encounter Date: 01/01/2020  PT End of Session - 01/01/20 2141    Visit Number  5    Number of Visits  9    Date for PT Re-Evaluation  01/27/20    Authorization Type  [employer] Medicare -10th visit PN needed    PT Start Time  1705    PT Stop Time  1743    PT Time Calculation (min)  38 min    Activity Tolerance  Patient tolerated treatment well    Behavior During Therapy  [facility] for tasks assessed/performed       Past Medical History:  Diagnosis Date  . BPH (benign prostatic hyperplasia) 10/08/2019  . Diabetes mellitus without complication ([place])   . Hyperlipidemia 10/08/2019  . Hypertension 10/08/2019  . Stroke ([place])   . Type 2 diabetes mellitus ([place]) 10/08/2019    Past Surgical History:  Procedure Laterality Date  . BUBBLE STUDY  10/10/2019   Procedure: BUBBLE STUDY;  Surgeon: Geralynn Rile, MD;  Location: [facility];  Service: Cardiology;;  . TEE WITHOUT CARDIOVERSION N/A 10/10/2019   Procedure: TRANSESOPHAGEAL ECHOCARDIOGRAM (TEE);  Surgeon: Geralynn Rile, MD;  Location: [facility];  Service: Cardiology;  Laterality: N/A;    There were no vitals filed for this visit.  Subjective Assessment - 01/01/20 1704    Subjective  Had to go for a kidney scan, but haven't heard anything from that. She gave me a new sheet last visit for corner balance exercises.  Been walking almost everyday.    Pertinent History  BPH, DMII, HLD, CKD IV    Limitations  Walking    Patient Stated Goals  "I think I'm doing ok, but I want to get back to walking"    Currently in Pain?  No/denies                       [facility] Adult PT Treatment/Exercise - 01/01/20  0001      Ambulation/Gait   Ambulation/Gait  Yes    Ambulation/Gait Assistance  5: Supervision    Ambulation/Gait Assistance Details  Gait with head turns for looking at cards, no LOB, just slowed pace.  Gait with lifting ball, holding ball over head, trunk rotation giving ball to therapist behind him and taking ball from therapist.  No overt LOB, just slowed pace.    Ambulation Distance (Feet)  460 Feet   230   Assistive device  None    Gait Pattern  Lateral trunk lean to right;Trunk flexed;Step-through pattern    Ambulation Surface  Level;Indoor    Gait Comments  Obstacle negotitation-stepping over 2" blocks and hurdles (5 obstacles total), x 6 reps, with min guard.  Pt has multiple times of trying to circumduct LLE with hurdle negotiation and knocking over hurdles.  Cues to get closer to obstacles for improved clearance.          Balance Exercises - 01/01/20 1712      Balance Exercises: Standing   Standing Eyes Opened  Narrow base of support (BOS);Foam/compliant surface;Head turns;5 reps   Head nods   Standing Eyes Closed  Narrow base of support (BOS);Head turns;Foam/compliant surface;5 reps   Head nods   Step Ups  Forward;Lateral;6 inch;UE support 2  x 10 reps each, BLEs, 3 sec hold, SLS   Partial Tandem Stance  Eyes open;Eyes closed;Foam/compliant surface;Other reps (comment)   Head turns/head nods x 10 reps    Other Standing Exercises  Standing on [employer]:  alternating single taps to cones x 10 reps, the double taps to cones x 5 reps. Forward step over 3 cones as obstacles, 10 reps, then side step over obstacles, 10 reps.  Cues for improved foot clearance at times          PT Short Term Goals - 12/26/19 1710      PT SHORT TERM GOAL #1   Title  Pt will be IND with initial HEP (Target Date: 12/28/19)    Baseline  Pt needs cuing but is doing at home    Time  4    Period  Weeks    Status  Partially Met    Target Date  12/28/19      PT SHORT TERM GOAL #2   Title  Pt will  improve FGA to >/=25/30 in order to indicate decreased fall risk.    Baseline  28/30 on 12/26/19    Status  Achieved      PT SHORT TERM GOAL #3   Title  Pt will perform stairs without rail in alternating pattern at mod I level in order to indicate improved strength/balance.    Baseline  met 12/26/19    Status  Achieved        PT Long Term Goals - 12/26/19 1911      PT LONG TERM GOAL #1   Title  Pt will be IND with final HEP for balance/endurance/strength. (Target Date: 01/27/20)    Time  8    Period  Weeks    Status  New      PT LONG TERM GOAL #2   Title  Pt will improve FGA to >/=28/30 in order to indicate decreased fall risk.      PT LONG TERM GOAL #3   Title  Pt will ambulate at gait speed of 3.30 ft/sec or better with decreased staggering and upright posture to indicate decreased fall risk.      PT LONG TERM GOAL #4   Title  Pt will ambulate >1000' over varying outdoor surfaces at mod I level in order to indicate return to community leisure activity.            Plan - 01/01/20 2141    Clinical Impression Statement  Skilled PT session focused on SLS and compliant surface work as well as dynamic balance activities.  He uses intermittent UE support for corner balance exercises and at least 1 UE support for most SLS activities.  He has difficulty with foot clearance at times with cones and hurdles.  He will continue to benefit from skilled PT to address balance and gait towards LTGs.    Personal Factors and Comorbidities  Age;Comorbidity 3+    Comorbidities  see above    Examination-Activity Limitations  Stairs;Squat   gait   Examination-Participation Restrictions  Community Activity;School;Driving    Stability/Clinical Decision Making  Stable/Uncomplicated    Rehab Potential  Good    PT Frequency  1x / week    PT Duration  8 weeks    PT Treatment/Interventions  ADLs/Self Care Home Management;DME Instruction;Gait training;Stair training;Functional mobility  training;Therapeutic activities;Therapeutic exercise;Balance training;Neuromuscular re-education;Vestibular    PT Next Visit Plan  Continue high level balance on compliant surfaces, unlevel surfaces, SLS    Consulted and Agree  with Plan of Care  Patient       Patient will benefit from skilled therapeutic intervention in order to improve the following deficits and impairments:  Abnormal gait, Decreased activity tolerance, Decreased balance, Postural dysfunction, Decreased mobility  Visit Diagnosis: Unsteadiness on feet  Other abnormalities of gait and mobility     Problem List Patient Active Problem List   Diagnosis Date Noted  . Acute CVA (cerebrovascular accident) ([place]) 10/08/2019  . Type 2 diabetes mellitus ([place]) 10/08/2019  . Hyperlipidemia 10/08/2019  . BPH (benign prostatic hyperplasia) 10/08/2019  . Hypertension 10/08/2019  . ARF (acute renal failure) ([place]) 05/08/2019    Erik Moss W. 01/01/2020, 9:46 PM  Frazier Butt., PT   [facility] [address] [place], [place], [address] Phone: [phone]   Fax:  [phone]  Name: Kazuma Elena MRN: [id number] Date of Birth: [birth year]

## 2020-01-09 ENCOUNTER — Ambulatory Visit: Payer: Medicare HMO | Admitting: Rehabilitation

## 2020-01-16 ENCOUNTER — Other Ambulatory Visit: Payer: Self-pay

## 2020-01-16 ENCOUNTER — Ambulatory Visit: Payer: Medicare HMO | Admitting: Rehabilitation

## 2020-01-16 ENCOUNTER — Encounter: Payer: Self-pay | Admitting: Rehabilitation

## 2020-01-16 DIAGNOSIS — R2681 Unsteadiness on feet: Secondary | ICD-10-CM

## 2020-01-16 DIAGNOSIS — R293 Abnormal posture: Secondary | ICD-10-CM | POA: Diagnosis not present

## 2020-01-16 DIAGNOSIS — R2689 Other abnormalities of gait and mobility: Secondary | ICD-10-CM
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
## 2020-01-16 NOTE — Patient Instructions (Signed)
Access Code: [email or web address] URL: [email or web address] Date: 01/16/2020 Prepared by: Cameron Sprang  Exercises Walking March - 2 x daily - 7 x weekly - 4 reps - 1 sets Tandem Walking with Counter Support - 2 x daily - 7 x weekly - 4 reps - 1 sets Backward Walking with Counter Support - 2 x daily - 7 x weekly - 4 reps - 1 sets Side Stepping with Counter Support - 2 x daily - 7 x weekly - 4 reps - 3 sets Romberg Stance Eyes Closed on Foam Pad - 1 x daily - 7 x weekly - 3 reps - 1 sets - 20 secs hold Romberg Stance on Foam Pad with Head Rotation - 1 x daily - 7 x weekly - 10 reps - 1 sets Romberg Stance with Head Nods on Foam Pad - 1 x daily - 7 x weekly - 10 reps - 3 sets Romberg Stance with Head Nods on Foam Pad - 1 x daily - 7 x weekly - 10 reps - 1 sets

## 2020-01-22 ENCOUNTER — Ambulatory Visit: Payer: Medicare HMO

## 2020-02-13 DIAGNOSIS — R350 Frequency of micturition: Secondary | ICD-10-CM | POA: Diagnosis not present

## 2020-02-13 DIAGNOSIS — N401 Enlarged prostate with lower urinary tract symptoms: Secondary | ICD-10-CM | POA: Diagnosis not present

## 2020-02-13 DIAGNOSIS — R351 Nocturia: Secondary | ICD-10-CM | POA: Diagnosis not present

## 2020-02-17 ENCOUNTER — Encounter: Payer: Self-pay | Admitting: Adult Health

## 2020-02-17 ENCOUNTER — Ambulatory Visit: Payer: Medicare HMO | Admitting: Adult Health

## 2020-02-17 VITALS — BP 140/74 | HR 88 | Temp 97.5°F | Ht 66.0 in | Wt 158.0 lb

## 2020-02-17 DIAGNOSIS — I6381 Other cerebral infarction due to occlusion or stenosis of small artery: Secondary | ICD-10-CM | POA: Diagnosis not present

## 2020-02-17 DIAGNOSIS — E1165 Type 2 diabetes mellitus with hyperglycemia: Secondary | ICD-10-CM

## 2020-02-17 DIAGNOSIS — I10 Essential (primary) hypertension: Secondary | ICD-10-CM | POA: Diagnosis not present

## 2020-02-17 DIAGNOSIS — E785 Hyperlipidemia, unspecified: Secondary | ICD-10-CM | POA: Diagnosis not present

## 2020-02-17 NOTE — Patient Instructions (Signed)
Continue to do exercises at home for ongoing improvement   Continue aspirin 81 mg daily  and atorvastatin for secondary stroke prevention  Continue to follow up with PCP regarding cholesterol, blood pressure and diabetes management   Continue to monitor blood pressure and sugar levels at home  Maintain strict control of hypertension with blood pressure goal below 130/90, diabetes with hemoglobin A1c goal below 6.5% and cholesterol with LDL cholesterol (bad cholesterol) goal below 70 mg/dL. I also advised the patient to eat a healthy diet with plenty of whole grains, cereals, fruits and vegetables, exercise regularly and maintain ideal body weight.         Thank you for coming to see [place] at [facility]. I hope we have been able to provide you high quality care today.  You may receive a patient satisfaction survey over the next few weeks. We would appreciate your feedback and comments so that we may continue to improve ourselves and the health of our patients.

## 2020-02-17 NOTE — Progress Notes (Signed)
[name] [address] [place]. [place] [address] [phone]       STROKE FOLLOW UP NOTE  [name] Date of Birth:  Oct 11, 1933 Medical Record Number:  [id number]   Reason for Referral: stroke follow up    CHIEF COMPLAINT:  Chief Complaint  Patient presents with  . Follow-up    rm 9, Lacunar infarction  , With daughter    HPI:  Today, 02/17/2020, Erik Moss returns for stroke follow-up accompanied by his daughter..  He has recovered well from a stroke standpoint without residual deficits and resolution of prior imbalance and gait impairment. Continues on aspirin and atorvastatin for secondary stroke prevention without side effects.  Blood pressure today 140/74.  He does not routinely monitor glucose levels at home. Continues to follow with PCP for DM, HTN and HLD.  No concerns at this time.     History provided for reference purposes only Initial visit 11/19/2019 JM: Erik Moss is a 86 year old male who is being seen today for hospital follow-up accompanied by his daughter and granddaughter.  Residual stroke deficits including imbalance, occasional dysarthria and increased fatigue.  He has not initiated therapies at this time as daughter has not had a chance to schedule but plans on calling neuro rehab to schedule initial evaluations.  At hospital discharge, it was recommended for him to use rolling walker at all times due to imbalance and safety concerns.  Daughter states that he refuses to use any type of assistive device but will furniture surf throughout the house.  Denies any recent falls.  He continues to live with his daughter and moved in with her from [facility] last summer due to COVID-19 concerns.  No further concerns regarding memory or cognition.  He has completed 3 weeks DAPT and continues on aspirin alone without bleeding or bruising.  Continues on atorvastatin without myalgias.  Blood pressure today satisfactory at 123/73.  Glucose levels  monitored routinely at home which have been stable per patient.  He has received his initial Covid vaccine recently and plans on obtaining second dose on 12/05/2019.  Denies new or worsening stroke/TIA symptoms.  Stroke admission 10/08/2019: Erik Moss is a 86 y.o. male with history of HTN, HLD, DB who woke on 10/08/2019 with difficulty walking with L leg weakness, resolving prior to arrival to ED. Evaluated by Dr. Erlinda Hong and stroke team with stroke work-up revealing right lateral thalamic/PL IC infarct as evidenced on MRI likely secondary to small vessel disease source.  MRA head/neck unremarkable.  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  TEE showed severe grade 4 atheroma in the aortic root, grade 3 and descending aorta with hypermobile IAS but no evidence of mass or thrombus identified.  Obtain blood cultures which did not show growth for greater than 24 hours.  Advised to continue on DAPT.  Recommended DAPT for 3 weeks and aspirin alone.  HTN stable.  LDL 97 and recommended switching Zocor to atorvastatin 40 mg daily.  Uncontrolled DM with A1c 7.9 and recommended close PCP follow-up for better DM control.  Other stroke risk factors include advanced age but no prior history of stroke.  Other active problems include BPH on Flomax, CKD stage IV and mild cognitive impairment.  Evaluated by therapies and recommended discharging home with outpatient PT/OT with residual imbalance and cognitive.  Stroke:  right lateral thalamic/PLLIC infarct likely secondary to small vessel disease source.  CT head No acute abnormality. Small vessel disease.    MRI  Small R lateral thalamus/PLIC infarct . Small vessel disease.   MRA head  Unremarkable   MRA neck Unremarkable   2D Echo small mass on NCC appears to be mobile (calcificaiton, papillary fibroelastoma vs veg). EF 65-70%.  TEE severe grade 4 atheroma in the  aortic root, grade 3 in descending aorta. No mass, thrombus. Hypermobile IAS. EF 60-65%  LDL 97  HgbA1c 7.9  SCDs for VTE prophylaxis  No antithrombotic prior to admission, now on aspirin 81 mg daily and clopidogrel 75 mg daily. Continue DAPT x 3 weeks then aspirin alone  Therapy recommendations:  OP PT, OP OT  Disposition:  Return home      ROS:   14 system review of systems performed and negative with exception of no complaints   PMH:  Past Medical History:  Diagnosis Date  . BPH (benign prostatic hyperplasia) 10/08/2019  . Diabetes mellitus without complication ([place])   . Hyperlipidemia 10/08/2019  . Hypertension 10/08/2019  . Stroke ([place])   . Type 2 diabetes mellitus ([place]) 10/08/2019    PSH:  Past Surgical History:  Procedure Laterality Date  . BUBBLE STUDY  10/10/2019   Procedure: BUBBLE STUDY;  Surgeon: Geralynn Rile, MD;  Location: [facility];  Service: Cardiology;;  . TEE WITHOUT CARDIOVERSION N/A 10/10/2019   Procedure: TRANSESOPHAGEAL ECHOCARDIOGRAM (TEE);  Surgeon: Geralynn Rile, MD;  Location: [facility];  Service: Cardiology;  Laterality: N/A;    Social History:  Social History   Socioeconomic History  . Marital status: Divorced    Spouse name: Not on file  . Number of children: Not on file  . Years of education: Not on file  . Highest education level: Not on file  Occupational History  . Not on file  Tobacco Use  . Smoking status: Never Smoker  . Smokeless tobacco: Never Used  Substance and Sexual Activity  . Alcohol use: Never  . Drug use: Never  . Sexual activity: Not on file  Other Topics Concern  . Not on file  Social History Narrative   11/19/19 lives with dgtr, grand dgtr   Social Determinants of Health   Financial Resource Strain:   . Difficulty of Paying Living Expenses:   Food Insecurity:   . Worried About Charity fundraiser in the Last Year:   . Arboriculturist in the Last Year:   Transportation Needs:    . Film/video editor (Medical):   Marland Kitchen Lack of Transportation (Non-Medical):   Physical Activity:   . Days of Exercise per Week:   . Minutes of Exercise per Session:   Stress:   . Feeling of Stress :   Social Connections:   . Frequency of Communication with Friends and Family:   . Frequency of Social Gatherings with Friends and Family:   . Attends Religious Services:   . Active Member of Clubs or Organizations:   . Attends Archivist Meetings:   Marland Kitchen Marital Status:   Intimate Partner Violence:   . Fear of Current or Ex-Partner:   . Emotionally Abused:   Marland Kitchen Physically Abused:   . Sexually Abused:     Family History: No family history on file.  Medications:   Current Outpatient Medications on File Prior to Visit  Medication Sig Dispense Refill  . amLODipine (NORVASC) 5 MG tablet Take 5 mg by mouth daily.    . clopidogrel (PLAVIX) 75 MG tablet Take 75  mg by mouth daily.    . ferrous gluconate (FERGON) 324 MG tablet Take 648 mg by mouth daily.     Marland Kitchen glimepiride (AMARYL) 1 MG tablet Take 1 mg by mouth daily with breakfast.    . mirabegron ER (MYRBETRIQ) 50 MG TB24 tablet Take 50 mg by mouth daily.    . Multiple Vitamin (MULTIVITAMIN WITH MINERALS) TABS tablet Take 1 tablet by mouth daily.    . sitaGLIPtin (JANUVIA) 25 MG tablet Take 25 mg by mouth daily.    . sodium bicarbonate 650 MG tablet Take 650 mg by mouth 2 (two) times daily.    . tamsulosin (FLOMAX) 0.4 MG CAPS capsule Take 0.4 mg by mouth at bedtime.     . triamcinolone ointment (KENALOG) 0.1 % Apply 1 application topically daily as needed (itching on lower legs.).     Marland Kitchen atorvastatin (LIPITOR) 40 MG tablet Take 1 tablet (40 mg total) by mouth daily. 90 tablet 0   No current facility-administered medications on file prior to visit.    Allergies:  No Known Allergies   Vitals  Vitals:   02/17/20 1606  BP: 140/74  Pulse: 88  Temp: (!) 97.5 F (36.4 C)  Weight: 158 lb (71.7 kg)  Height: 5\' 6"  (1.676 m)    Body mass index is 25.5 kg/m. No exam data present  Physical exam  General: well developed, well nourished, very pleasant elderly African-American male, seated, in no evident distress Head: head normocephalic and atraumatic.   Neck: supple with no carotid or supraclavicular bruits Cardiovascular: regular rate and rhythm, no murmurs Musculoskeletal: no deformity Skin:  no rash/petichiae Vascular:  Normal pulses all extremities   Neurologic Exam Mental Status: Awake and fully alert. Normal speech and language during visit.  Oriented to place and time. Recent and remote memory intact. Attention span, concentration and fund of knowledge appropriate. Mood and affect appropriate.  Cranial Nerves: Pupils equal, briskly reactive to light. Extraocular movements full without nystagmus. Visual fields full to confrontation. Hearing intact. Facial sensation intact. Face, tongue, palate moves normally and symmetrically.  Motor: Normal bulk and tone. Normal strength in all tested extremity muscles. Sensory.: intact to touch , pinprick , position and vibratory sensation.  Coordination: Rapid alternating movements normal in all extremities. Finger-to-nose and heel-to-shin performed accurately bilaterally. Gait and Station: Arises from chair without difficulty. Stance is normal. Gait demonstrates normal stride length and balance without assistive device.  Able to tandem walk with mild difficulty. Reflexes: 1+ and symmetric. Toes downgoing.          ASSESSMENT: Erik Moss is a 86 y.o. year old male presented with left leg weakness upon awakening on 10/08/2019 with stroke work-up revealing right lateral thalamic/PL IC infarct likely secondary to small vessel disease source. Vascular risk factors include HTN, HLD uncontrolled DM and advanced age.  Of note, 2D echo and TEE showed likely calcification.  Recovered well from a stroke standpoint with complete recovery and no residual  deficits    PLAN:  1. Right thalamic/PLIC stroke: Continue aspirin 81 mg daily  and atorvastatin for secondary stroke prevention. Maintain strict control of hypertension with blood pressure goal below 130/90, diabetes with hemoglobin A1c goal below 6.5% and cholesterol with LDL cholesterol (bad cholesterol) goal below 70 mg/dL.  I also advised the patient to eat a healthy diet with plenty of whole grains, cereals, fruits and vegetables, exercise regularly with at least 30 minutes of continuous activity daily and maintain ideal body weight. 2. HTN: Stable.  Ongoing  follow-up with PCP for monitoring and management 3. HLD: Continue atorvastatin and ongoing follow-up with PCP for prescribing, monitoring and management -plans on scheduling follow-up visit for routine lab work and repeat lipid panel 4. DMII: Continue to follow PCP for ongoing monitoring and management   Overall stable from stroke standpoint and continues to follow with PCP closely therefore recommend follow-up on an as-needed basis   I spent 26 minutes of face-to-face and non-face-to-face time with patient and daughter.  This included previsit chart review, lab review, study review, order entry, electronic health record documentation, patient education     Frann Rider, [facility]  [facility] [address] [place], [place] [address]  Phone [phone] Fax [phone] Note: This document was prepared with digital dictation and possible smart phrase technology. Any transcriptional errors that result from this process are unintentional.

## 2020-02-24 NOTE — Progress Notes (Signed)
I agree with the above plan 

## 2020-02-25 DIAGNOSIS — E1169 Type 2 diabetes mellitus with other specified complication: Secondary | ICD-10-CM | POA: Diagnosis not present

## 2020-02-25 DIAGNOSIS — Z125 Encounter for screening for malignant neoplasm of prostate: Secondary | ICD-10-CM | POA: Diagnosis not present

## 2020-02-25 DIAGNOSIS — Z7984 Long term (current) use of oral hypoglycemic drugs: Secondary | ICD-10-CM | POA: Diagnosis not present

## 2020-02-25 DIAGNOSIS — E782 Mixed hyperlipidemia: Secondary | ICD-10-CM | POA: Diagnosis not present

## 2020-02-25 DIAGNOSIS — Z0000 Encounter for general adult medical examination without abnormal findings: Secondary | ICD-10-CM | POA: Diagnosis not present

## 2020-02-25 DIAGNOSIS — N184 Chronic kidney disease, stage 4 (severe): Secondary | ICD-10-CM | POA: Diagnosis not present

## 2020-02-25 DIAGNOSIS — N400 Enlarged prostate without lower urinary tract symptoms: Secondary | ICD-10-CM | POA: Diagnosis not present

## 2020-02-25 DIAGNOSIS — I10 Essential (primary) hypertension: Secondary | ICD-10-CM | POA: Diagnosis not present

## 2020-03-10 DIAGNOSIS — M6281 Muscle weakness (generalized): Secondary | ICD-10-CM | POA: Diagnosis not present

## 2020-03-10 DIAGNOSIS — R351 Nocturia: Secondary | ICD-10-CM | POA: Diagnosis not present

## 2020-03-10 DIAGNOSIS — R350 Frequency of micturition: Secondary | ICD-10-CM | POA: Diagnosis not present

## 2020-03-25 DIAGNOSIS — M6281 Muscle weakness (generalized): Secondary | ICD-10-CM | POA: Diagnosis not present

## 2020-03-25 DIAGNOSIS — R351 Nocturia: Secondary | ICD-10-CM | POA: Diagnosis not present

## 2020-03-25 DIAGNOSIS — R350 Frequency of micturition: Secondary | ICD-10-CM | POA: Diagnosis not present

## 2020-03-31 ENCOUNTER — Other Ambulatory Visit: Payer: Self-pay

## 2020-03-31 ENCOUNTER — Encounter: Payer: Medicare HMO | Attending: Family Medicine | Admitting: Skilled Nursing Facility1

## 2020-03-31 ENCOUNTER — Encounter: Payer: Self-pay | Admitting: Skilled Nursing Facility1

## 2020-03-31 DIAGNOSIS — E119 Type 2 diabetes mellitus without complications: Secondary | ICD-10-CM | POA: Diagnosis not present

## 2020-03-31 NOTE — Progress Notes (Signed)
  Assessment:  Primary concerns today: CKD stage 4.   Pt arrived well appearing with a steady gate. Pts daughter states the renal doctor called them last week stating the pt is high in potassium. Pts daughter states she gets glucerna for her father. Pt states he takes miralax daily twice a day as well as dulcalax. Pts daughter states the pts potassium is high. Pts daughter states her dad does not eat dinner. Pt states he walks every day for about 60 minutes and bikes for 30 minutes. A1C 7.7. Pt states he does not check his blood sugar and has not for over a year.  Pt lives with his daughter and she makes his lunch and dinner.  Pt does take a multivitamin.  Pt does seem to have some cognition issues but able to follow the conversation well enough along with his daughter who is his care taker.   MEDICATIONS: see list   DIETARY INTAKE:  Usual eating pattern includes 2 meals and 0-1 snacks per day.  Everyday foods include none stated.  Avoided foods include multiple.    24 hr recall: 2 glucerna a day been doing that for the last year  Breakfast: Oatmeal + peaches + pear or apple + strawberries + low fat milk  Lunch: Beef or seafood or pork + peppers + brown rice or quinoa + 2 slices whole wheat bread or skipped  Dinner: flounder + non starchy or skipped  Beverages: water, tea: maybe about 64+90, 2 glucerna a day  Usual physical activity: walking and biking 90+ minutes   Estimated energy needs: 2000 calories 225 g carbohydrates 40-50 g protein  Progress Towards Goal(s):  In progress.   Nutritional Diagnosis:  NI-1.6 Predicted suboptional energy  As related to renal disease/poor cognition.  As evidenced by 6 pound weight loss/24 hr recall at 75% needs.    Intervention:  Nutrition counseling. Dietitian educated pt/his daughter on balanced meals within the context of CKD stage 4 and weight management.    Goals: Limit to 1 glucerna per day (high in potassium) (amount of protein in a  meal) Try zevia Limit high potassium fruits/vegetables to 1 serving per day Avoid orange juice and pepsi Aim to eat every 3-5 hours: Do NOT skip meals Aim to add a high fat item at each meal: oil, butter, cream cheese, etc. Aim to get back to 158 pounds and weigh weekly to monitor weight Do not avoid complex carbohydrates as they are helpful calories Follow the renal myplate to help expand food options and satisfy your appetite  Limit protein to 15 grams per meal  Teaching Method Utilized:  Visual Auditory Hands on  Handouts given during visit include:  Renal MyPlate  Barriers to learning/adherence to lifestyle change: cognitive delay  Demonstrated degree of understanding via:  Teach Back   Monitoring/Evaluation:  Dietary intake, exercise, and body weight prn.

## 2020-04-14 DIAGNOSIS — M6281 Muscle weakness (generalized): Secondary | ICD-10-CM | POA: Diagnosis not present

## 2020-04-14 DIAGNOSIS — R351 Nocturia: Secondary | ICD-10-CM | POA: Diagnosis not present

## 2020-04-14 DIAGNOSIS — R350 Frequency of micturition: Secondary | ICD-10-CM | POA: Diagnosis not present

## 2020-04-20 DIAGNOSIS — N1832 Chronic kidney disease, stage 3b: Secondary | ICD-10-CM | POA: Diagnosis not present

## 2020-05-19 DIAGNOSIS — N1832 Chronic kidney disease, stage 3b: Secondary | ICD-10-CM | POA: Diagnosis not present

## 2020-05-19 DIAGNOSIS — D631 Anemia in chronic kidney disease: Secondary | ICD-10-CM | POA: Diagnosis not present

## 2020-05-19 DIAGNOSIS — E875 Hyperkalemia: Secondary | ICD-10-CM | POA: Diagnosis not present

## 2020-05-19 DIAGNOSIS — E1122 Type 2 diabetes mellitus with diabetic chronic kidney disease: Secondary | ICD-10-CM | POA: Diagnosis not present

## 2020-05-19 DIAGNOSIS — I129 Hypertensive chronic kidney disease with stage 1 through stage 4 chronic kidney disease, or unspecified chronic kidney disease: Secondary | ICD-10-CM | POA: Diagnosis not present

## 2020-05-19 DIAGNOSIS — E872 Acidosis: Secondary | ICD-10-CM | POA: Diagnosis not present

## 2020-05-19 DIAGNOSIS — N400 Enlarged prostate without lower urinary tract symptoms: Secondary | ICD-10-CM | POA: Diagnosis not present

## 2020-05-19 DIAGNOSIS — N281 Cyst of kidney, acquired: Secondary | ICD-10-CM | POA: Diagnosis not present

## 2020-06-10 DIAGNOSIS — J309 Allergic rhinitis, unspecified: Secondary | ICD-10-CM | POA: Diagnosis not present

## 2020-06-10 DIAGNOSIS — N400 Enlarged prostate without lower urinary tract symptoms: Secondary | ICD-10-CM | POA: Diagnosis not present

## 2020-06-10 DIAGNOSIS — Z008 Encounter for other general examination: Secondary | ICD-10-CM | POA: Diagnosis not present

## 2020-06-10 DIAGNOSIS — E1151 Type 2 diabetes mellitus with diabetic peripheral angiopathy without gangrene: Secondary | ICD-10-CM | POA: Diagnosis not present

## 2020-06-10 DIAGNOSIS — K5900 Constipation, unspecified: Secondary | ICD-10-CM | POA: Diagnosis not present

## 2020-06-10 DIAGNOSIS — N189 Chronic kidney disease, unspecified: Secondary | ICD-10-CM | POA: Diagnosis not present

## 2020-06-10 DIAGNOSIS — E1122 Type 2 diabetes mellitus with diabetic chronic kidney disease: Secondary | ICD-10-CM | POA: Diagnosis not present

## 2020-06-10 DIAGNOSIS — E11620 Type 2 diabetes mellitus with diabetic dermatitis: Secondary | ICD-10-CM | POA: Diagnosis not present

## 2020-06-10 DIAGNOSIS — I10 Essential (primary) hypertension: Secondary | ICD-10-CM | POA: Diagnosis not present

## 2020-06-10 DIAGNOSIS — N3281 Overactive bladder: Secondary | ICD-10-CM | POA: Diagnosis not present

## 2020-06-10 DIAGNOSIS — E785 Hyperlipidemia, unspecified: Secondary | ICD-10-CM | POA: Diagnosis not present

## 2020-08-07 DIAGNOSIS — R69 Illness, unspecified: Secondary | ICD-10-CM | POA: Diagnosis not present

## 2020-08-30 ENCOUNTER — Emergency Department (HOSPITAL_COMMUNITY)
Admission: EM | Admit: 2020-08-30 | Discharge: 2020-08-30 | Disposition: A | Discharge status: Home / Self Care | Payer: Medicare HMO | Attending: Emergency Medicine | Admitting: Emergency Medicine

## 2020-08-30 ENCOUNTER — Encounter (HOSPITAL_COMMUNITY): Payer: Self-pay

## 2020-08-30 ENCOUNTER — Other Ambulatory Visit: Payer: Self-pay

## 2020-08-30 DIAGNOSIS — R21 Rash and other nonspecific skin eruption: Secondary | ICD-10-CM | POA: Diagnosis not present

## 2020-08-30 DIAGNOSIS — I10 Essential (primary) hypertension: Secondary | ICD-10-CM | POA: Insufficient documentation

## 2020-08-30 DIAGNOSIS — E119 Type 2 diabetes mellitus without complications: Secondary | ICD-10-CM | POA: Diagnosis not present

## 2020-08-30 DIAGNOSIS — Z7984 Long term (current) use of oral hypoglycemic drugs: Secondary | ICD-10-CM | POA: Insufficient documentation

## 2020-08-30 DIAGNOSIS — Z79899 Other long term (current) drug therapy: Secondary | ICD-10-CM | POA: Insufficient documentation

## 2020-08-30 DIAGNOSIS — N401 Enlarged prostate with lower urinary tract symptoms: Secondary | ICD-10-CM | POA: Insufficient documentation

## 2020-08-30 DIAGNOSIS — K5900 Constipation, unspecified: Secondary | ICD-10-CM | POA: Diagnosis not present

## 2020-08-30 DIAGNOSIS — R3911 Hesitancy of micturition: Secondary | ICD-10-CM | POA: Diagnosis not present

## 2020-08-30 LAB — URINALYSIS, ROUTINE W REFLEX MICROSCOPIC
Bacteria, UA: NONE SEEN
Bilirubin Urine: NEGATIVE
Glucose, UA: 50 mg/dL — AB
Ketones, ur: NEGATIVE mg/dL
Leukocytes,Ua: NEGATIVE
Nitrite: NEGATIVE
Protein, ur: NEGATIVE mg/dL
Specific Gravity, Urine: 1.01 (ref 1.005–1.030)
pH: 6 (ref 5.0–8.0)

## 2020-08-30 MED ORDER — PREDNISONE 10 MG (21) PO TBPK: ORAL_TABLET | ORAL | 0 refills | Status: DC

## 2020-08-30 NOTE — ED Triage Notes (Signed)
Pt presents with c/o urinary frequency with difficulty urinating as well as constipation. Pt reports that he is able to urinate, but it takes some straining and effort to pass urine. Pt reports he did have a very small bowel movement this morning but nothing significant. Pt reports he stopped taking his dulcolax and miralax and this is why he is having these issues. Pt also c/o rash on his arms and back for approx on week.

## 2020-08-30 NOTE — ED Provider Notes (Signed)
[facility] EMERGENCY DEPARTMENT Provider Note  CSN: [id number] Arrival date & time: 08/30/20 2536    History Chief Complaint  Patient presents with  . Urinary Frequency  . Constipation  . Rash    HPI  Erik Moss is a 86 y.o. male here with multiple complaints. First he has a rash on much of his body. Itchy bumps that started on his elbows and now on his back, buttocks and legs. He denies having bed bugs, states he has had them before but checked and none there today. He has been using Triamcinolone cream with some relief but he has run out due to the large area.   He also has chronic constipation and urinary hesitancy. He has been taking medications for same but states his daughter, that he lives with, fusses at him about going to the bathroom frequently at night.    Past Medical History:  Diagnosis Date  . BPH (benign prostatic hyperplasia) 10/08/2019  . Diabetes mellitus without complication ([place])   . Hyperlipidemia 10/08/2019  . Hypertension 10/08/2019  . Renal disease   . Stroke ([place])   . Type 2 diabetes mellitus ([place]) 10/08/2019    Past Surgical History:  Procedure Laterality Date  . BUBBLE STUDY  10/10/2019   Procedure: BUBBLE STUDY;  Surgeon: Geralynn Rile, MD;  Location: [facility];  Service: Cardiology;;  . TEE WITHOUT CARDIOVERSION N/A 10/10/2019   Procedure: TRANSESOPHAGEAL ECHOCARDIOGRAM (TEE);  Surgeon: Geralynn Rile, MD;  Location: [facility];  Service: Cardiology;  Laterality: N/A;    History reviewed. No pertinent family history.  Social History   Tobacco Use  . Smoking status: Never Smoker  . Smokeless tobacco: Never Used  Substance Use Topics  . Alcohol use: Never  . Drug use: Never     Home Medications Prior to Admission medications   Medication Sig Start Date End Date Taking? Authorizing Provider  amLODipine (NORVASC) 5 MG tablet Take 5 mg by mouth daily.    [provider]  atorvastatin (LIPITOR) 40  MG tablet Take 1 tablet (40 mg total) by mouth daily. 10/10/19 01/08/20  [place] [name], DO  clopidogrel (PLAVIX) 75 MG tablet Take 75 mg by mouth daily. 11/05/19   [provider]  ferrous gluconate (FERGON) 324 MG tablet Take 648 mg by mouth daily.     [provider]  glimepiride (AMARYL) 1 MG tablet Take 1 mg by mouth daily with breakfast.    [provider]  mirabegron ER (MYRBETRIQ) 50 MG TB24 tablet Take 50 mg by mouth daily.    [provider]  Multiple Vitamin (MULTIVITAMIN WITH MINERALS) TABS tablet Take 1 tablet by mouth daily.    [provider]  predniSONE (STERAPRED UNI-PAK 21 TAB) 10 MG (21) TBPK tablet 10mg  Tabs, 6 day taper. Use as directed 08/30/20   Truddie Hidden, MD  sitaGLIPtin (JANUVIA) 25 MG tablet Take 25 mg by mouth daily.    [provider]  sodium bicarbonate 650 MG tablet Take 650 mg by mouth 2 (two) times daily.    [provider]  tamsulosin (FLOMAX) 0.4 MG CAPS capsule Take 0.4 mg by mouth at bedtime.     [provider]  triamcinolone ointment (KENALOG) 0.1 % Apply 1 application topically daily as needed (itching on lower legs.).     [provider]     Allergies    Patient has no known allergies.   Review of Systems   Review of Systems A comprehensive review  of systems was completed and negative except as noted in HPI.    Physical Exam BP (!) 150/75 (BP Location: Right Arm)   Pulse 82   Temp 97.7 F (36.5 C) (Oral)   Resp 16   SpO2 100%   Physical Exam Vitals and nursing note reviewed.  Constitutional:      Appearance: Normal appearance.  HENT:     Head: Normocephalic and atraumatic.     Nose: Nose normal.     Mouth/Throat:     Mouth: Mucous membranes are moist.  Eyes:     Extraocular Movements: Extraocular movements intact.     Conjunctiva/sclera: Conjunctivae normal.  Cardiovascular:     Rate and Rhythm: Normal rate.  Pulmonary:     Effort: Pulmonary  effort is normal.     Breath sounds: Normal breath sounds.  Abdominal:     General: Abdomen is flat.     Palpations: Abdomen is soft.     Tenderness: There is no abdominal tenderness.  Musculoskeletal:        General: No swelling. Normal range of motion.     Cervical back: Neck supple.  Skin:    General: Skin is warm and dry.     Findings: Rash (urticarial rash with hard subcutaneous nodules to multiple areas including arms, legs, back and buttocks) present.  Neurological:     General: No focal deficit present.     Mental Status: He is alert.  Psychiatric:        Mood and Affect: Mood normal.      ED Results / Procedures / Treatments   Labs (all labs ordered are listed, but only abnormal results are displayed) Labs Reviewed  URINALYSIS, ROUTINE W REFLEX MICROSCOPIC - Abnormal; Notable for the following components:      Result Value   Glucose, UA 50 (*)    Hgb urine dipstick SMALL (*)    All other components within normal limits    EKG None  Radiology No results found.  Procedures Procedures  Medications Ordered in the ED Medications - No data to display   MDM Rules/Calculators/A&P MDM Patient with an itchy rash, will Rx Prednisone, advised to watch his blood glucose carefully. He has chronic constipation and urinary hesitancy from BPH, will check UA. Abdomen is benign.  ED Course  I have reviewed the triage vital signs and the nursing notes.  Pertinent labs & imaging results that were available during my care of the patient were reviewed by me and considered in my medical decision making (see chart for details).  Clinical Course as of Aug 30 1246  Sun Aug 30, 2020  1245 Patient's UA is neg. He mentions that he was previously in ALF when he was living in [place] but moved here to live with his daughter when the Covid Pandemic started. He is interested in going back to a local ALF. Advised his PCP would need to begin that process.    [CS]    Clinical Course User  Index [CS] Truddie Hidden, MD    Final Clinical Impression(s) / ED Diagnoses Final diagnoses:  Benign prostatic hyperplasia with urinary hesitancy  Constipation, unspecified constipation type  Rash and nonspecific skin eruption    Rx / DC Orders ED Discharge Orders         Ordered    predniSONE (STERAPRED UNI-PAK 21 TAB) 10 MG (21) TBPK tablet        08/30/20 1247           Karle Starch,  Mercie Eon, MD 08/30/20 1247

## 2020-08-30 NOTE — ED Notes (Signed)
Call received from pt daughter Edsel Shives [phone] requesting rtn call for pt status/updates when possible. [employer]

## 2020-09-03 DIAGNOSIS — E1169 Type 2 diabetes mellitus with other specified complication: Secondary | ICD-10-CM | POA: Diagnosis not present

## 2020-09-03 DIAGNOSIS — Z7984 Long term (current) use of oral hypoglycemic drugs: Secondary | ICD-10-CM | POA: Diagnosis not present

## 2020-09-03 DIAGNOSIS — I10 Essential (primary) hypertension: Secondary | ICD-10-CM | POA: Diagnosis not present

## 2020-09-03 DIAGNOSIS — N184 Chronic kidney disease, stage 4 (severe): Secondary | ICD-10-CM | POA: Diagnosis not present

## 2020-09-03 DIAGNOSIS — N400 Enlarged prostate without lower urinary tract symptoms: Secondary | ICD-10-CM | POA: Diagnosis not present

## 2020-09-03 DIAGNOSIS — R21 Rash and other nonspecific skin eruption: Secondary | ICD-10-CM | POA: Diagnosis not present

## 2020-09-03 DIAGNOSIS — E559 Vitamin D deficiency, unspecified: Secondary | ICD-10-CM | POA: Diagnosis not present

## 2020-09-03 DIAGNOSIS — K5900 Constipation, unspecified: Secondary | ICD-10-CM | POA: Diagnosis not present

## 2020-09-22 DIAGNOSIS — L308 Other specified dermatitis: Secondary | ICD-10-CM | POA: Diagnosis not present

## 2020-09-22 DIAGNOSIS — L298 Other pruritus: Secondary | ICD-10-CM | POA: Diagnosis not present

## 2020-09-23 DIAGNOSIS — R69 Illness, unspecified: Secondary | ICD-10-CM | POA: Diagnosis not present

## 2020-09-23 DIAGNOSIS — N400 Enlarged prostate without lower urinary tract symptoms: Secondary | ICD-10-CM | POA: Diagnosis not present

## 2020-09-23 DIAGNOSIS — E872 Acidosis: Secondary | ICD-10-CM | POA: Diagnosis not present

## 2020-09-23 DIAGNOSIS — N281 Cyst of kidney, acquired: Secondary | ICD-10-CM | POA: Diagnosis not present

## 2020-09-23 DIAGNOSIS — I129 Hypertensive chronic kidney disease with stage 1 through stage 4 chronic kidney disease, or unspecified chronic kidney disease: Secondary | ICD-10-CM | POA: Diagnosis not present

## 2020-09-23 DIAGNOSIS — N1832 Chronic kidney disease, stage 3b: Secondary | ICD-10-CM | POA: Diagnosis not present

## 2020-09-23 DIAGNOSIS — E875 Hyperkalemia: Secondary | ICD-10-CM | POA: Diagnosis not present

## 2020-09-23 DIAGNOSIS — R21 Rash and other nonspecific skin eruption: Secondary | ICD-10-CM | POA: Diagnosis not present

## 2020-09-23 DIAGNOSIS — D631 Anemia in chronic kidney disease: Secondary | ICD-10-CM | POA: Diagnosis not present

## 2020-09-23 DIAGNOSIS — E1122 Type 2 diabetes mellitus with diabetic chronic kidney disease: Secondary | ICD-10-CM | POA: Diagnosis not present

## 2020-09-24 ENCOUNTER — Encounter (HOSPITAL_COMMUNITY): Payer: Self-pay

## 2020-09-24 ENCOUNTER — Other Ambulatory Visit: Payer: Self-pay

## 2020-09-24 ENCOUNTER — Emergency Department (HOSPITAL_COMMUNITY): Payer: Medicare HMO

## 2020-09-24 ENCOUNTER — Observation Stay (HOSPITAL_COMMUNITY)
Admission: EM | Admit: 2020-09-24 | Discharge: 2020-09-26 | Disposition: A | Discharge status: Home / Self Care | Payer: Medicare HMO | Attending: Internal Medicine | Admitting: Internal Medicine

## 2020-09-24 DIAGNOSIS — I10 Essential (primary) hypertension: Secondary | ICD-10-CM | POA: Insufficient documentation

## 2020-09-24 DIAGNOSIS — E1165 Type 2 diabetes mellitus with hyperglycemia: Secondary | ICD-10-CM | POA: Diagnosis present

## 2020-09-24 DIAGNOSIS — Z20822 Contact with and (suspected) exposure to covid-19: Secondary | ICD-10-CM | POA: Insufficient documentation

## 2020-09-24 DIAGNOSIS — N179 Acute kidney failure, unspecified: Principal | ICD-10-CM | POA: Insufficient documentation

## 2020-09-24 DIAGNOSIS — R739 Hyperglycemia, unspecified: Secondary | ICD-10-CM

## 2020-09-24 DIAGNOSIS — Z79899 Other long term (current) drug therapy: Secondary | ICD-10-CM | POA: Diagnosis not present

## 2020-09-24 DIAGNOSIS — D72829 Elevated white blood cell count, unspecified: Secondary | ICD-10-CM | POA: Diagnosis not present

## 2020-09-24 DIAGNOSIS — Z7984 Long term (current) use of oral hypoglycemic drugs: Secondary | ICD-10-CM | POA: Diagnosis not present

## 2020-09-24 DIAGNOSIS — Z7902 Long term (current) use of antithrombotics/antiplatelets: Secondary | ICD-10-CM | POA: Insufficient documentation

## 2020-09-24 DIAGNOSIS — R21 Rash and other nonspecific skin eruption: Secondary | ICD-10-CM | POA: Diagnosis not present

## 2020-09-24 LAB — BASIC METABOLIC PANEL
Anion gap: 11 (ref 5–15)
BUN: 37 mg/dL — ABNORMAL HIGH (ref 8–23)
CO2: 22 mmol/L (ref 22–32)
Calcium: 9.2 mg/dL (ref 8.9–10.3)
Chloride: 97 mmol/L — ABNORMAL LOW (ref 98–111)
Creatinine, Ser: 2.21 mg/dL — ABNORMAL HIGH (ref 0.61–1.24)
GFR, Estimated: 28 mL/min — ABNORMAL LOW (ref >60)
Glucose, Bld: 441 mg/dL — ABNORMAL HIGH (ref 70–99)
Potassium: 4.7 mmol/L (ref 3.5–5.1)
Sodium: 130 mmol/L — ABNORMAL LOW (ref 135–145)

## 2020-09-24 LAB — CBC
HCT: 33.7 % — ABNORMAL LOW (ref 39.0–52.0)
Hemoglobin: 11.2 g/dL — ABNORMAL LOW (ref 13.0–17.0)
MCH: 31.2 pg (ref 26.0–34.0)
MCHC: 33.2 g/dL (ref 30.0–36.0)
MCV: 93.9 fL (ref 80.0–100.0)
Platelets: 283 10*3/uL (ref 150–400)
RBC: 3.59 MIL/uL — ABNORMAL LOW (ref 4.22–5.81)
RDW: 13.5 % (ref 11.5–15.5)
WBC: 17.8 10*3/uL — ABNORMAL HIGH (ref 4.0–10.5)
nRBC: 0 % (ref 0.0–0.2)

## 2020-09-24 LAB — URINALYSIS, ROUTINE W REFLEX MICROSCOPIC
Bacteria, UA: NONE SEEN
Bilirubin Urine: NEGATIVE
Glucose, UA: >=500 mg/dL — AB
Ketones, ur: NEGATIVE mg/dL
Leukocytes,Ua: NEGATIVE
Nitrite: NEGATIVE
Protein, ur: NEGATIVE mg/dL
Specific Gravity, Urine: 1.006 (ref 1.005–1.030)
pH: 6 (ref 5.0–8.0)

## 2020-09-24 LAB — CBG MONITORING, ED
Glucose-Capillary: 415 mg/dL — ABNORMAL HIGH (ref 70–99)
Glucose-Capillary: 386 mg/dL — ABNORMAL HIGH (ref 70–99)
Glucose-Capillary: 343 mg/dL — ABNORMAL HIGH (ref 70–99)
Glucose-Capillary: 310 mg/dL — ABNORMAL HIGH (ref 70–99)

## 2020-09-24 LAB — RESP PANEL BY RT-PCR (FLU A&B, COVID) ARPGX2
Influenza A by PCR: NEGATIVE
Influenza B by PCR: NEGATIVE
SARS Coronavirus 2 by RT PCR: NEGATIVE

## 2020-09-24 LAB — HEMOGLOBIN A1C
Hgb A1c MFr Bld: 10.5 % — ABNORMAL HIGH (ref 4.8–5.6)
Mean Plasma Glucose: 254.65 mg/dL

## 2020-09-24 IMAGING — CR DG CHEST 2V
2 series · 2 of 2 positions shown · non-contrast
Comparison: None.

CLINICAL DATA: 87-year-old male with leukocytosis.

EXAM:
CHEST - 2 VIEW

[w chest lat]
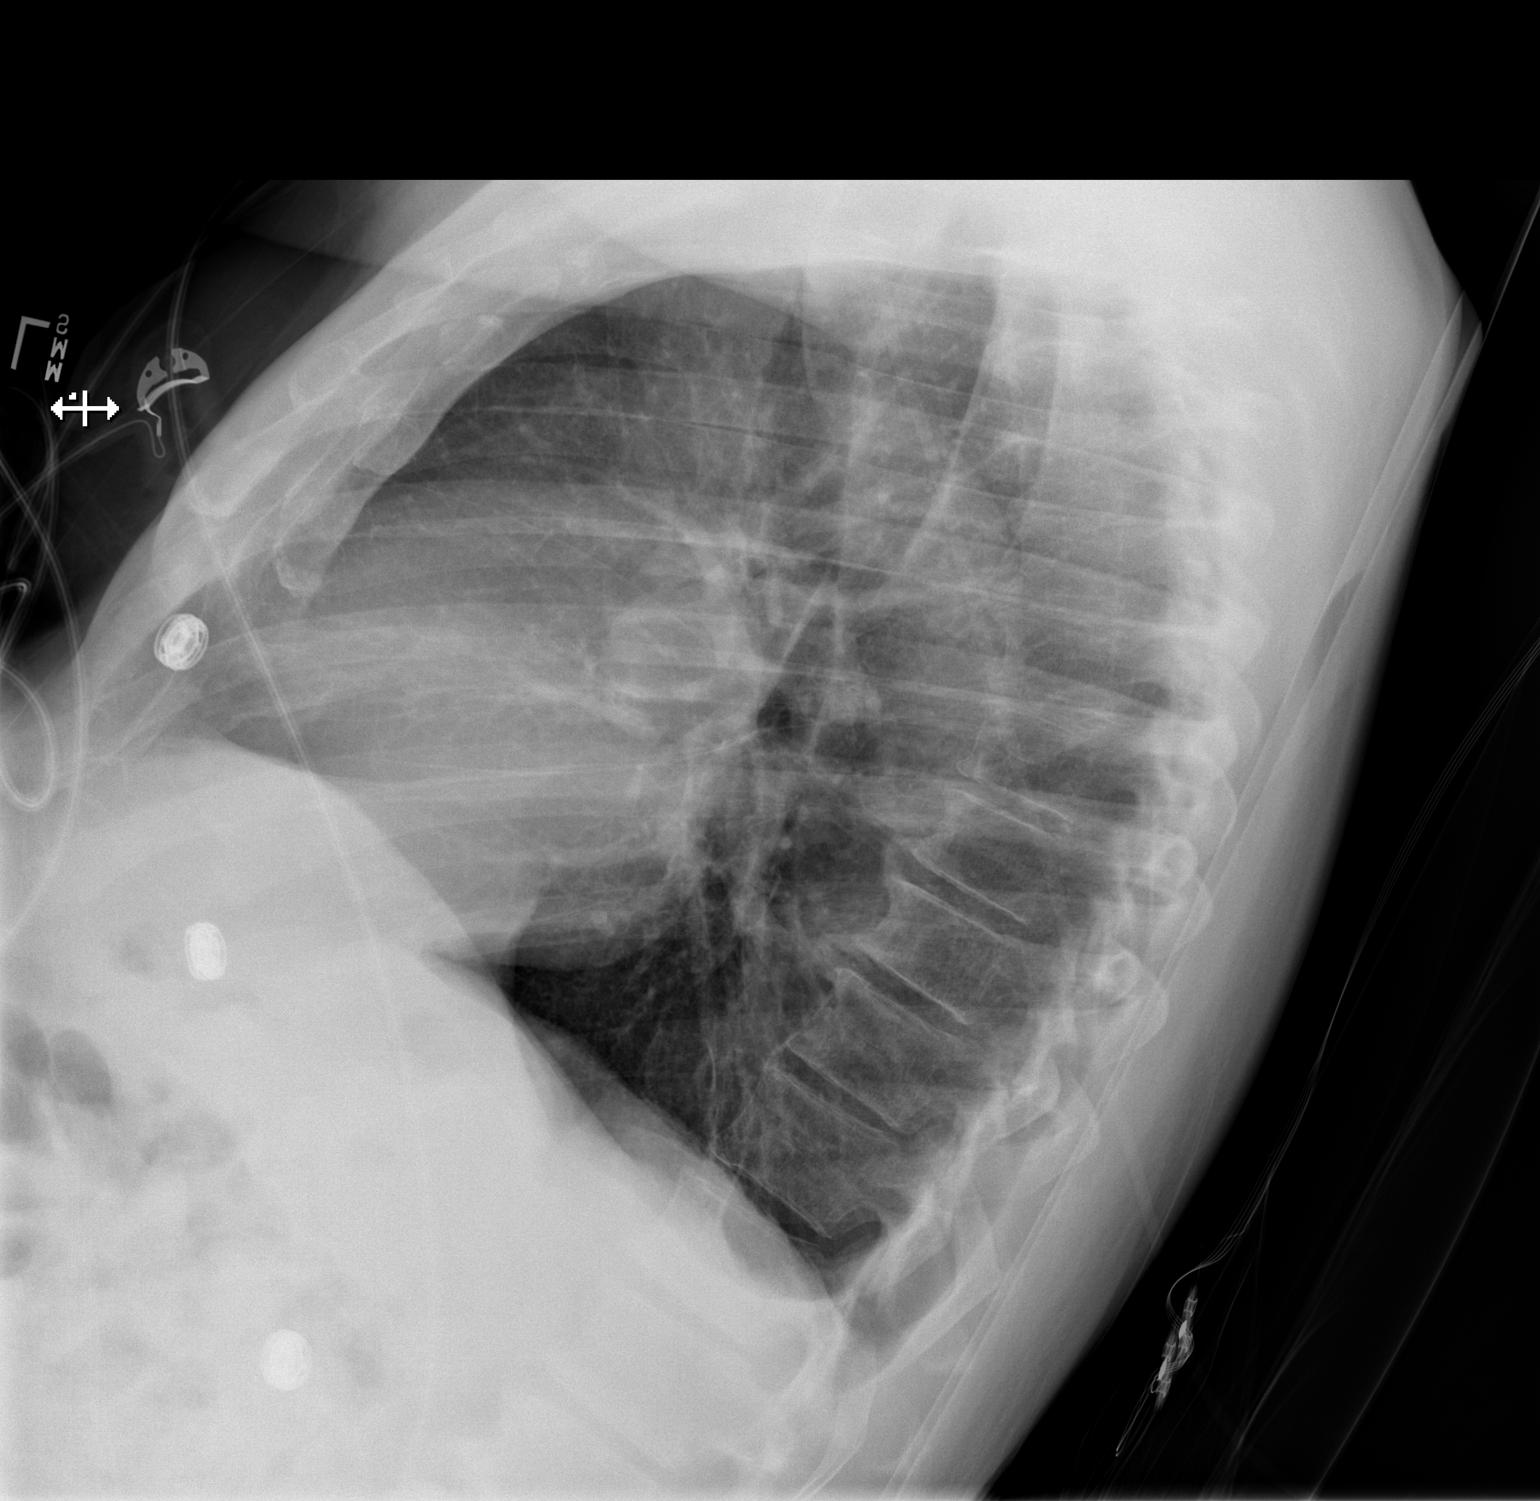

[x chest ap]
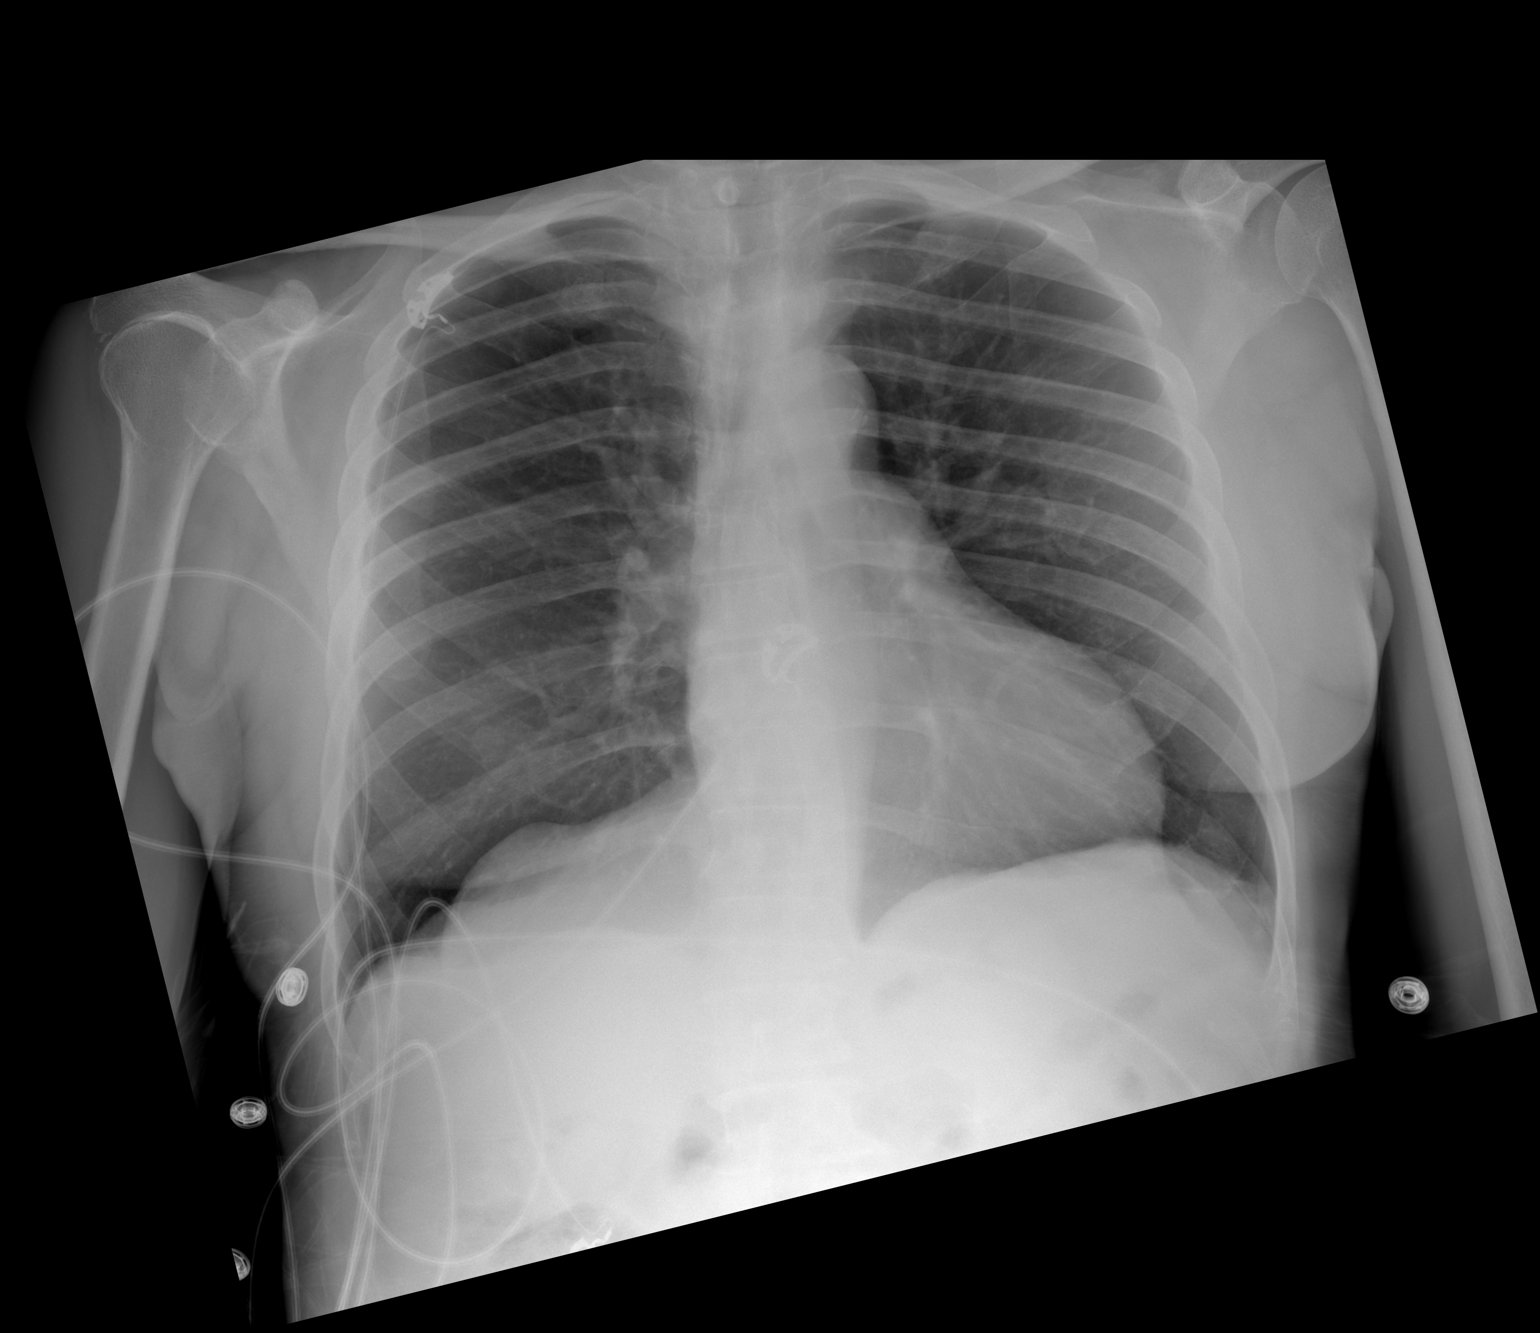

[2 of 2 positions shown; findings below may reference images not displayed]

FINDINGS: The heart size and mediastinal contours are within normal limits.
Both lungs are clear. The visualized skeletal structures are
unremarkable.
IMPRESSION: No active cardiopulmonary disease.

## 2020-09-24 MED ORDER — POLYETHYLENE GLYCOL 3350 17 G PO PACK
17.0000 g | PACK | Freq: Two times a day (BID) | ORAL | Status: DC
  Administered 2020-09-24 – 2020-09-26 (×4): 17 g via ORAL
  Filled 2020-09-24 (×4): qty 1

## 2020-09-24 MED ORDER — FERROUS GLUCONATE 324 (38 FE) MG PO TABS
648.0000 mg | ORAL_TABLET | Freq: Every day | ORAL | Status: DC
  Administered 2020-09-25 – 2020-09-26 (×2): 648 mg via ORAL
  Filled 2020-09-24 (×2): qty 2

## 2020-09-24 MED ORDER — SODIUM CHLORIDE 0.9 % IV BOLUS: 1000.0000 mL | Freq: Once | INTRAVENOUS | Status: DC

## 2020-09-24 MED ORDER — SODIUM CHLORIDE 0.9 % IV BOLUS
500.0000 mL | Freq: Once | INTRAVENOUS | Status: AC
  Administered 2020-09-24: 500 mL via INTRAVENOUS

## 2020-09-24 MED ORDER — INSULIN ASPART 100 UNIT/ML ~~LOC~~ SOLN
0.0000 [IU] | Freq: Three times a day (TID) | SUBCUTANEOUS | Status: DC
  Administered 2020-09-25: 3 [IU] via SUBCUTANEOUS
  Administered 2020-09-25: 2 [IU] via SUBCUTANEOUS
  Administered 2020-09-25: 3 [IU] via SUBCUTANEOUS
  Administered 2020-09-26: 5 [IU] via SUBCUTANEOUS
  Administered 2020-09-26: 2 [IU] via SUBCUTANEOUS
  Filled 2020-09-24: qty 0.09

## 2020-09-24 MED ORDER — MIRABEGRON ER 25 MG PO TB24
50.0000 mg | ORAL_TABLET | Freq: Every day | ORAL | Status: DC
  Administered 2020-09-25 – 2020-09-26 (×4): 50 mg via ORAL
  Filled 2020-09-24 (×2): qty 2

## 2020-09-24 MED ORDER — SODIUM CHLORIDE 0.9 % IV BOLUS: 500.0000 mL | Freq: Once | INTRAVENOUS | Status: DC

## 2020-09-24 MED ORDER — TAMSULOSIN HCL 0.4 MG PO CAPS
0.4000 mg | ORAL_CAPSULE | Freq: Every day | ORAL | Status: DC
  Administered 2020-09-24 – 2020-09-25 (×2): 0.4 mg via ORAL
  Filled 2020-09-24 (×2): qty 1

## 2020-09-24 MED ORDER — INSULIN ASPART 100 UNIT/ML ~~LOC~~ SOLN
5.0000 [IU] | Freq: Once | SUBCUTANEOUS | Status: AC
  Administered 2020-09-24: 5 [IU] via SUBCUTANEOUS
  Filled 2020-09-24: qty 0.05

## 2020-09-24 MED ORDER — ONDANSETRON HCL 4 MG/2ML IJ SOLN
4.0000 mg | Freq: Four times a day (QID) | INTRAMUSCULAR | Status: DC
  Administered 2020-09-24 – 2020-09-26 (×5): 4 mg via INTRAVENOUS
  Filled 2020-09-24 (×6): qty 2

## 2020-09-24 MED ORDER — AMLODIPINE BESYLATE 5 MG PO TABS
5.0000 mg | ORAL_TABLET | Freq: Every day | ORAL | Status: DC
  Administered 2020-09-25 – 2020-09-26 (×2): 5 mg via ORAL
  Filled 2020-09-24 (×2): qty 1

## 2020-09-24 MED ORDER — CLOPIDOGREL BISULFATE 75 MG PO TABS
75.0000 mg | ORAL_TABLET | Freq: Every day | ORAL | Status: DC
  Administered 2020-09-25 – 2020-09-26 (×2): 75 mg via ORAL
  Filled 2020-09-24 (×2): qty 1

## 2020-09-24 MED ORDER — ATORVASTATIN CALCIUM 40 MG PO TABS
40.0000 mg | ORAL_TABLET | Freq: Every day | ORAL | Status: DC
  Administered 2020-09-25 – 2020-09-26 (×2): 40 mg via ORAL
  Filled 2020-09-24 (×2): qty 1

## 2020-09-24 MED ORDER — SODIUM CHLORIDE 0.9 % IV SOLN: INTRAVENOUS | Status: AC

## 2020-09-24 MED ORDER — OXYCODONE HCL 5 MG PO TABS: 5.0000 mg | ORAL_TABLET | Freq: Four times a day (QID) | ORAL | Status: DC | PRN

## 2020-09-24 MED ORDER — ADULT MULTIVITAMIN W/MINERALS CH
1.0000 | ORAL_TABLET | Freq: Every day | ORAL | Status: DC
  Administered 2020-09-25 – 2020-09-26 (×2): 1 via ORAL
  Filled 2020-09-24 (×2): qty 1

## 2020-09-24 NOTE — ED Provider Notes (Signed)
[facility] DEPT Provider Note   CSN: [id number] Arrival date & time: 09/24/20  1125     History Chief Complaint  Patient presents with  . Hyperglycemia    Erik Moss is a 86 y.o. male.  HPI      Erik Moss is a 86 y.o. male, with a history of DM type II, BPH, hyperlipidemia, HTN, CVA, presenting to the ED with hyperglycemia.  Patient was reportedly seen by one of his doctors in the clinic yesterday, lab work was sample at that time.  Results returned today, they noted his blood sugar to be over 400, and they advised him to come to the emergency department.  Patient was seen in the emergency department on November 7 for pruritic rash on his arms, for which she was prescribed 6-day taper of prednisone. He then was seen by a dermatologist earlier this week and again prescribed a taper of prednisone.  He does endorse urinary frequency, but this is improved from his complete inability to urinate that he was evaluated for by the urologist a couple weeks ago. He voices compliance with all of his medications.  Denies fever/chills, abdominal pain, chest pain, shortness of breath, N/V/C/D, dysuria, difficulty urinating, flank/back pain, dizziness, or any other complaints.  Past Medical History:  Diagnosis Date  . BPH (benign prostatic hyperplasia) 10/08/2019  . Diabetes mellitus without complication ([place])   . Hyperlipidemia 10/08/2019  . Hypertension 10/08/2019  . Renal disease   . Stroke ([place])   . Type 2 diabetes mellitus ([place]) 10/08/2019    Patient Active Problem List   Diagnosis Date Noted  . Acute CVA (cerebrovascular accident) ([place]) 10/08/2019  . Type 2 diabetes mellitus ([place]) 10/08/2019  . Hyperlipidemia 10/08/2019  . BPH (benign prostatic hyperplasia) 10/08/2019  . Hypertension 10/08/2019  . ARF (acute renal failure) ([place]) 05/08/2019    Past Surgical History:  Procedure Laterality Date  . BUBBLE STUDY  10/10/2019    Procedure: BUBBLE STUDY;  Surgeon: Geralynn Rile, MD;  Location: [facility];  Service: Cardiology;;  . TEE WITHOUT CARDIOVERSION N/A 10/10/2019   Procedure: TRANSESOPHAGEAL ECHOCARDIOGRAM (TEE);  Surgeon: Geralynn Rile, MD;  Location: [facility];  Service: Cardiology;  Laterality: N/A;       History reviewed. No pertinent family history.  Social History   Tobacco Use  . Smoking status: Never Smoker  . Smokeless tobacco: Never Used  Substance Use Topics  . Alcohol use: Never  . Drug use: Never    Home Medications Prior to Admission medications   Medication Sig Start Date End Date Taking? Authorizing Provider  amLODipine (NORVASC) 5 MG tablet Take 5 mg by mouth daily.    [provider]  atorvastatin (LIPITOR) 40 MG tablet Take 1 tablet (40 mg total) by mouth daily. 10/10/19 01/08/20  [place] Erik J, DO  clopidogrel (PLAVIX) 75 MG tablet Take 75 mg by mouth daily. 11/05/19   [provider]  ferrous gluconate (FERGON) 324 MG tablet Take 648 mg by mouth daily.     [provider]  glimepiride (AMARYL) 1 MG tablet Take 1 mg by mouth daily with breakfast.    [provider]  mirabegron ER (MYRBETRIQ) 50 MG TB24 tablet Take 50 mg by mouth daily.    [provider]  Multiple Vitamin (MULTIVITAMIN WITH MINERALS) TABS tablet Take 1 tablet by mouth daily.    [provider]  predniSONE (STERAPRED UNI-PAK 21 TAB) 10 MG (21) TBPK tablet 10mg  Tabs, 6 day taper. Use  as directed 08/30/20   Erik Hidden, MD  sitaGLIPtin (JANUVIA) 25 MG tablet Take 25 mg by mouth daily.    [provider]  sodium bicarbonate 650 MG tablet Take 650 mg by mouth 2 (two) times daily.    [provider]  tamsulosin (FLOMAX) 0.4 MG CAPS capsule Take 0.4 mg by mouth at bedtime.     [provider]  triamcinolone ointment (KENALOG) 0.1 % Apply 1 application topically daily as needed (itching on lower legs.).     [provider]    Allergies    Patient has no known allergies.  Review of Systems   Review of Systems  Constitutional: Negative for chills and fever.  Respiratory: Negative for shortness of breath.   Cardiovascular: Negative for chest pain.  Gastrointestinal: Negative for abdominal pain, blood in stool, constipation, diarrhea, nausea and vomiting.  Genitourinary: Positive for frequency. Negative for dysuria, flank pain, hematuria, scrotal swelling and testicular pain.  Musculoskeletal: Negative for back pain.  Neurological: Negative for dizziness, syncope and weakness.  All other systems reviewed and are negative.   Physical Exam Updated Vital Signs BP (!) 152/79 (BP Location: Left Arm)   Pulse 83   Temp 98.5 F (36.9 C) (Oral)   Resp 18   SpO2 97%   Physical Exam Vitals and nursing note reviewed.  Constitutional:      General: He is not in acute distress.    Appearance: He is well-developed. He is not diaphoretic.  HENT:     Head: Normocephalic and atraumatic.     Mouth/Throat:     Mouth: Mucous membranes are moist.     Pharynx: Oropharynx is clear.  Eyes:     Conjunctiva/sclera: Conjunctivae normal.  Cardiovascular:     Rate and Rhythm: Normal rate and regular rhythm.     Pulses: Normal pulses.          Radial pulses are 2+ on the right side and 2+ on the left side.       Posterior tibial pulses are 2+ on the right side and 2+ on the left side.     Heart sounds: Normal heart sounds.     Comments: Tactile temperature in the extremities appropriate and equal bilaterally. Pulmonary:     Effort: Pulmonary effort is normal. No respiratory distress.     Breath sounds: Normal breath sounds.  Abdominal:     Palpations: Abdomen is soft.     Tenderness: There is no abdominal tenderness. There is no guarding.  Musculoskeletal:     Cervical back: Neck supple.     Right lower leg: No edema.     Left lower leg: No edema.  Skin:    General: Skin is warm and dry.   Neurological:     Mental Status: He is alert.  Psychiatric:        Mood and Affect: Mood and affect normal.        Speech: Speech normal.        Behavior: Behavior normal.     ED Results / Procedures / Treatments   Labs (all labs ordered are listed, but only abnormal results are displayed) Labs Reviewed  BASIC METABOLIC PANEL - Abnormal; Notable for the following components:      Result Value   Sodium 130 (*)    Chloride 97 (*)    Glucose, Bld 441 (*)    BUN 37 (*)    Creatinine, Ser 2.21 (*)    GFR, Estimated 28 (*)  All other components within normal limits  CBC - Abnormal; Notable for the following components:   WBC 17.8 (*)    RBC 3.59 (*)    Hemoglobin 11.2 (*)    HCT 33.7 (*)    All other components within normal limits  CBG MONITORING, ED - Abnormal; Notable for the following components:   Glucose-Capillary 415 (*)    All other components within normal limits  CBG MONITORING, ED - Abnormal; Notable for the following components:   Glucose-Capillary 386 (*)    All other components within normal limits  CBG MONITORING, ED - Abnormal; Notable for the following components:   Glucose-Capillary 343 (*)    All other components within normal limits  URINE CULTURE  URINALYSIS, ROUTINE W REFLEX MICROSCOPIC  HEMOGLOBIN A1C   BUN  Date Value Ref Range Status  09/24/2020 37 (H) 8 - 23 mg/dL Final  10/10/2019 29 (H) 8 - 23 mg/dL Final  10/08/2019 31 (H) 8 - 23 mg/dL Final  05/10/2019 25 (H) 8 - 23 mg/dL Final   Creatinine, Ser  Date Value Ref Range Status  09/24/2020 2.21 (H) 0.61 - 1.24 mg/dL Final  10/10/2019 1.78 (H) 0.61 - 1.24 mg/dL Final  10/08/2019 1.92 (H) 0.61 - 1.24 mg/dL Final  05/10/2019 1.95 (H) 0.61 - 1.24 mg/dL Final     EKG None  Radiology No results found.  Procedures Procedures (including critical care time)  Medications Ordered in ED Medications  sodium chloride 0.9 % bolus 500 mL (500 mLs Intravenous New Bag/Given 09/24/20 1549)   insulin aspart (novoLOG) injection 5 Units (5 Units Subcutaneous Given 09/24/20 1548)    ED Course  I have reviewed the triage vital signs and the nursing notes.  Pertinent labs & imaging results that were available during my care of the patient were reviewed by me and considered in my medical decision making (see chart for details).  Clinical Course as of Sep 24 1602  Thu Sep 24, 2020  1548 Spoke with Dr. Junious Dresser, hospitalist.  Agrees to admit the patient.  Requests we order a hemoglobin A1c.   [SJ]    Clinical Course User Index [SJ] Cashmere Harmes, Helane Gunther, PA-C   MDM Rules/Calculators/A&P                          Patient presents with hyperglycemia.  He is a non-insulin-dependent diabetic. He also endorses urinary frequency without urinary complaints. Post void residual at 119 cc. Patient is currently taking prednisone, which could account for his hyperglycemia.  The concern, however, is that he may need a short course of insulin to manage his glucose and he is insulin nave. He additionally appears to have an AKI. He does have a leukocytosis, question of whether this is actually from prednisone.  Patient admitted for observation and hydration.  UA and chest x-ray pending at time of admission.  Findings and plan of care discussed with Antony Blackbird, MD. Dr. Sherry Ruffing personally evaluated and examined this patient.  Vitals:   09/24/20 1415 09/24/20 1430 09/24/20 1445 09/24/20 1500  BP:  (!) 173/85  (!) 161/81  Pulse: 85 82 82 73  Resp: 18 10 19 12   Temp:      TempSrc:      SpO2: 100% 97% 100% 98%  Weight:      Height:         Final Clinical Impression(s) / ED Diagnoses Final diagnoses:  Hyperglycemia  AKI (acute kidney injury) ([place])  Rx / DC Orders ED Discharge Orders    None       Layla Maw 09/24/20 1607    Tegeler, Gwenyth Allegra, MD 09/26/20 0010

## 2020-09-24 NOTE — H&P (Addendum)
History and Physical    Erik Moss VHQ:[id number] DOB: 24-Nov-1932 DOA: 09/24/2020  Referring MD/NP/PA:  PCP: [name], FNP    Outpatient Specialists:  Patient coming from: Home  Chief Complaint: Frequent urination  HPI: Erik Moss is a 86 y.o. male with medical history hypertension, diabetes mellitus type 2 controlled with oral medication, previous CVA without any residual hemiparesis who was seen by his dermatologist on Monday last week due to generalized rash which maculopapular.  He was started on prednisone taper which patient started taking on Tuesday.  He took 7 tablets of 5 mg and on Wednesday 6 tablets of 5 mg. Patient started noticing increasing urinary frequency and excessive thirst anxiety C his primary provider yesterday.  After the visit and blood work, he was called about late evening/night to inform him of markedly elevated blood glucose of greater than 400 and was advised  He denied any chest pain, palpitation or diaphoresis.  He also denies any fever, chills or shortness of breath.  No headaches or dizziness Patient has significant memory lapses and most of his history provided during diarrhea show some discrepancy with what patient's daughter told me to live with patient in the same room. Daughter also reported that patient is taking bottles of her medication along with pain and at the time has requested to [facility] out of the [facility] and go live in assisted living condition.   ED Course:  In the ED, vitals were heart rate of 87, respiratory rate of 11, blood pressure 182/82 and saturating above 90% on room air. Serum chemistry showed sodium of 130, potassium of 4.7, chloride of 97, CO2 of 22, blood sugar of 441, BUN of 37 and creatinine of 2.21.  Anion gap was 11. CBC showed WBC of 17.6, hemoglobin 11.3 and hematocrit 37.7. Patient received 5 units of insulin and 500 cc bolus of fluid in the ED. At time of exam, patient is alert and oriented x3 but with  noticeable cognitive impairment.  He is not in acute distress and keep being repetitive with discussion and unable to corroborate history very well. I called and spoke with her daughter Erik Moss who gave me detailed account of history and provided patients primary provider who is Jillyn Ledger with [facility] physician   Review of Systems: As per HPI otherwise 10 point review of systems negative.    Past Medical History:  Diagnosis Date  . BPH (benign prostatic hyperplasia) 10/08/2019  . Diabetes mellitus without complication ([place])   . Hyperlipidemia 10/08/2019  . Hypertension 10/08/2019  . Renal disease   . Stroke ([place])   . Type 2 diabetes mellitus ([place]) 10/08/2019    Past Surgical History:  Procedure Laterality Date  . BUBBLE STUDY  10/10/2019   Procedure: BUBBLE STUDY;  Surgeon: Geralynn Rile, MD;  Location: [facility];  Service: Cardiology;;  . TEE WITHOUT CARDIOVERSION N/A 10/10/2019   Procedure: TRANSESOPHAGEAL ECHOCARDIOGRAM (TEE);  Surgeon: Geralynn Rile, MD;  Location: [facility];  Service: Cardiology;  Laterality: N/A;     reports that he has never smoked. He has never used smokeless tobacco. He reports that he does not drink alcohol and does not use drugs.  No Known Allergies  History reviewed. No pertinent family history.   Prior to Admission medications   Medication Sig Start Date End Date Taking? Authorizing Provider  amLODipine (NORVASC) 5 MG tablet Take 5 mg by mouth daily.   Yes [provider]  atorvastatin (LIPITOR) 40 MG tablet Take 1 tablet (  40 mg total) by mouth daily. 10/10/19 09/24/20 Yes [place] Donnamarie Poag, DO  clopidogrel (PLAVIX) 75 MG tablet Take 75 mg by mouth daily. 11/05/19  Yes [provider]  ferrous gluconate (FERGON) 324 MG tablet Take 648 mg by mouth daily.    Yes [provider]  glimepiride (AMARYL) 2 MG tablet Take 2 mg by mouth in the morning and at bedtime.   Yes [provider]  Magnesium  Hydroxide (DULCOLAX PO) Take 1 tablet by mouth in the morning and at bedtime.   Yes [provider]  mirabegron ER (MYRBETRIQ) 50 MG TB24 tablet Take 50 mg by mouth daily.   Yes [provider]  Multiple Vitamin (MULTIVITAMIN WITH MINERALS) TABS tablet Take 1 tablet by mouth daily.   Yes [provider]  polyethylene glycol (MIRALAX / GLYCOLAX) 17 g packet Take 17 g by mouth 2 (two) times daily.   Yes [provider]  predniSONE (DELTASONE) 5 MG tablet Take 5 mg by mouth See admin instructions. Tapering 6-5-3-2-1.   Yes [provider]  Protein POWD Take 1 each by mouth daily.   Yes [provider]  sitaGLIPtin (JANUVIA) 25 MG tablet Take 25 mg by mouth daily.   Yes [provider]  sodium bicarbonate 650 MG tablet Take 1,300 mg by mouth daily.    Yes [provider]  tamsulosin (FLOMAX) 0.4 MG CAPS capsule Take 0.4 mg by mouth at bedtime.    Yes [provider]  triamcinolone ointment (KENALOG) 0.1 % Apply 1 application topically daily as needed (itching on lower legs.).    Yes [provider]  triamcinolone (KENALOG) 0.1 % Apply 1 application topically daily as needed (itching).  09/22/20   [provider]    Physical Exam: Vitals:   09/24/20 1430 09/24/20 1445 09/24/20 1500 09/24/20 1741  BP: (!) 173/85  (!) 161/81 134/86  Pulse: 82 82 73 79  Resp: 10 19 12 16   Temp:      TempSrc:      SpO2: 97% 100% 98% 98%  Weight:      Height:          Constitutional: NAD, calm, comfortable Vitals:   09/24/20 1430 09/24/20 1445 09/24/20 1500 09/24/20 1741  BP: (!) 173/85  (!) 161/81 134/86  Pulse: 82 82 73 79  Resp: 10 19 12 16   Temp:      TempSrc:      SpO2: 97% 100% 98% 98%  Weight:      Height:       Eyes: PERRL, lids and conjunctivae normal ENMT: Mucous membranes are moist. Posterior pharynx clear of any exudate or lesions.Normal dentition.  Neck: normal, supple, no masses, no  thyromegaly Respiratory: clear to auscultation bilaterally, no wheezing, no crackles. Normal respiratory effort. No accessory muscle use.  Cardiovascular: Regular rate and rhythm, no murmurs / rubs / gallops. No extremity edema. 2+ pedal pulses. No carotid bruits.  Abdomen: no tenderness, no masses palpated. No hepatosplenomegaly. Bowel sounds positive.  Musculoskeletal: no clubbing / cyanosis. No joint deformity upper and lower extremities. Good ROM, no contractures. Normal muscle tone.  Skin: no rashes, lesions, ulcers. No induration Neurologic: CN 2-12 grossly intact. Sensation intact, DTR normal. Strength 5/5 in all 4.  Psychiatric: Normal judgment and insight. Alert and oriented x 3. Normal mood.   ( Labs on Admission: I have personally reviewed following labs and imaging studies  CBC: Recent Labs  Lab 09/24/20 1234  WBC 17.8*  HGB 11.2*  HCT 33.7*  MCV 93.9  PLT 440   Basic Metabolic Panel: Recent Labs  Lab 09/24/20 1234  NA 130*  K 4.7  CL 97*  CO2 22  GLUCOSE 441*  BUN 37*  CREATININE 2.21*  CALCIUM 9.2   GFR: Estimated Creatinine Clearance: 22.1 mL/min (A) (by C-G formula based on SCr of 2.21 mg/dL (H)). Liver Function Tests: No results for input(s): AST, ALT, ALKPHOS, BILITOT, PROT, ALBUMIN in the last 168 hours. No results for input(s): LIPASE, AMYLASE in the last 168 hours. No results for input(s): AMMONIA in the last 168 hours. Coagulation Profile: No results for input(s): INR, PROTIME in the last 168 hours. Cardiac Enzymes: No results for input(s): CKTOTAL, CKMB, CKMBINDEX, TROPONINI in the last 168 hours. BNP (last 3 results) No results for input(s): PROBNP in the last 8760 hours. HbA1C: No results for input(s): HGBA1C in the last 72 hours. CBG: Recent Labs  Lab 09/24/20 1140 09/24/20 1340 09/24/20 1558  GLUCAP 415* 386* 343*   Lipid Profile: No results for input(s): CHOL, HDL, LDLCALC, TRIG, CHOLHDL, LDLDIRECT in the last 72 hours. Thyroid  Function Tests: No results for input(s): TSH, T4TOTAL, FREET4, T3FREE, THYROIDAB in the last 72 hours. Anemia Panel: No results for input(s): VITAMINB12, FOLATE, FERRITIN, TIBC, IRON, RETICCTPCT in the last 72 hours. Urine analysis:    Component Value Date/Time   COLORURINE COLORLESS (A) 09/24/2020 1439   APPEARANCEUR CLEAR 09/24/2020 1439   LABSPEC 1.006 09/24/2020 1439   PHURINE 6.0 09/24/2020 1439   GLUCOSEU >=500 (A) 09/24/2020 1439   HGBUR SMALL (A) 09/24/2020 1439   BILIRUBINUR NEGATIVE 09/24/2020 1439   KETONESUR NEGATIVE 09/24/2020 1439   PROTEINUR NEGATIVE 09/24/2020 1439   NITRITE NEGATIVE 09/24/2020 1439   LEUKOCYTESUR NEGATIVE 09/24/2020 1439   Sepsis Labs: @LABRCNTIP (procalcitonin:4,lacticidven:4) ) Recent Results (from the past 240 hour(s))  Resp Panel by RT-PCR (Flu A&B, Covid) Nasopharyngeal Swab     Status: None   Collection Time: 09/24/20  4:23 PM   Specimen: Nasopharyngeal Swab; Nasopharyngeal(NP) swabs in vial transport medium  Result Value Ref Range Status   SARS Coronavirus 2 by RT PCR NEGATIVE NEGATIVE Final    Comment: (NOTE) SARS-CoV-2 target nucleic acids are NOT DETECTED.  The SARS-CoV-2 RNA is generally detectable in upper respiratory specimens during the acute phase of infection. The lowest concentration of SARS-CoV-2 viral copies this assay can detect is 138 copies/mL. A negative result does not preclude SARS-Cov-2 infection and should not be used as the sole basis for treatment or other patient management decisions. A negative result may occur with  improper specimen collection/handling, submission of specimen other than nasopharyngeal swab, presence of viral mutation(s) within the areas targeted by this assay, and inadequate number of viral copies(<138 copies/mL). A negative result must be combined with clinical observations, patient history, and epidemiological information. The expected result is Negative.  Fact Sheet for Patients:   EntrepreneurPulse.com.au  Fact Sheet for Healthcare Providers:  IncredibleEmployment.be  This test is no t yet approved or cleared by the [place] FDA and  has been authorized for detection and/or diagnosis of SARS-CoV-2 by FDA under an Emergency Use Authorization (EUA). This EUA will remain  in effect (meaning this test can be used) for the duration of the COVID-19 declaration under Section 564(b)(1) of the Act, 21 U.S.C.section 360bbb-3(b)(1), unless the authorization is terminated  or revoked sooner.       Influenza A by PCR NEGATIVE NEGATIVE Final   Influenza B by PCR NEGATIVE NEGATIVE Final  Comment: (NOTE) The Xpert Xpress SARS-CoV-2/FLU/RSV plus assay is intended as an aid in the diagnosis of influenza from Nasopharyngeal swab specimens and should not be used as a sole basis for treatment. Nasal washings and aspirates are unacceptable for Xpert Xpress SARS-CoV-2/FLU/RSV testing.  Fact Sheet for Patients: EntrepreneurPulse.com.au  Fact Sheet for Healthcare Providers: IncredibleEmployment.be  This test is not yet approved or cleared by the [place] FDA and has been authorized for detection and/or diagnosis of SARS-CoV-2 by FDA under an Emergency Use Authorization (EUA). This EUA will remain in effect (meaning this test can be used) for the duration of the COVID-19 declaration under Section 564(b)(1) of the Act, 21 U.S.C. section 360bbb-3(b)(1), unless the authorization is terminated or revoked.  Performed at [facility], [place] [address] [place],  [address]      Radiological Exams on Admission: DG Chest 2 View  Result Date: 09/24/2020 CLINICAL DATA:  86 year old male with leukocytosis. EXAM: CHEST - 2 VIEW COMPARISON:  None. FINDINGS: The heart size and mediastinal contours are within normal limits. Both lungs are clear. The visualized skeletal structures  are unremarkable. IMPRESSION: No active cardiopulmonary disease. Electronically Signed   By: Anner Crete M.D.   On: 09/24/2020 16:30    EKG: Independently reviewed.   Assessment/Plan Principal Problem:   Hyperglycemia due to diabetes mellitus ([place]) Hyperglycemia likely induced by steroid which was recently started on Tuesday, 09/22/2020 patient has not been checking his blood sugar at home regularly and unable to identify/notice blood sugar elevation with steroid use. Went to the clinic and was noted to have significant elevation in blood sugar was asked to come to the ED. He received 5 units of insulin and 500 cc bolus of fluid.  Blood sugar has been trending down. Continue with CBG and POCT 4 times daily Maintain hypoglycemic protocol Continue with diabetic diet. Obtain hemoglobin A1c.  History of right thalamic infarct/stroke-10/08/2019.  Patient completed dual antiplatelet therapy and currently only on single antiplatelet therapy with plavix. Continue with Plavix and statin for secondary stroke prevention.   Acute kidney injury likely secondary to dehydration from increased diuresis with hyperglycemia.  Baseline serum creatinine was 1.7 and admitting creatinine was 2.21 with greater than 0.3 increasing creatinine from baseline per KDIGO criteria Continue to monitor renal function and avoid nephrotoxic agents  Essential hypertension.  Blood pressure.  Uncontrolled Resume home antihypertensive medications  Benign prostatic hyperplasia Continue with Flomax and mirabegron   Hyperlipidemia Continue with statin       DVT prophylaxis: Heparin  Code Status: Full Family Communication: I spoke with patient's daughter and discussed plan of care with her. Disposition Plan: Patient is from home will be discharged to home with home health services.  He is currently on observation for possible discharge home tomorrow with adequate control of blood sugar. Consults called:  None Admission status: Patient is placed on observation. He came in with hyperglycemia likely induced by steroid use which was recently started. Hold steroid and plan to discharge home with control of blood sugar. History resume his oral hypoglycemic medications. Patient has some cognitive impairment I will need transition of care consult and possible home health RN and aide.    Elie Confer MD [facility] Pager [phone]  If 7PM-7AM, please contact night-coverage [email or web address] Password [email or web address]  09/24/2020, 6:18 PM

## 2020-09-24 NOTE — ED Triage Notes (Signed)
Pt reports coming here for a rash about a month ago. Pt unsure if he was given steroids. Pt reports going to PCP yesterday and blood glucose was in the 400s. Pt denies any N/V/D, lightheadedness or any other symptoms.

## 2020-09-24 NOTE — ED Notes (Signed)
CBG - 310

## 2020-09-25 DIAGNOSIS — E1165 Type 2 diabetes mellitus with hyperglycemia: Secondary | ICD-10-CM | POA: Diagnosis not present

## 2020-09-25 LAB — COMPREHENSIVE METABOLIC PANEL
ALT: 18 U/L (ref 0–44)
AST: 14 U/L — ABNORMAL LOW (ref 15–41)
Albumin: 3.4 g/dL — ABNORMAL LOW (ref 3.5–5.0)
Alkaline Phosphatase: 55 U/L (ref 38–126)
Anion gap: 9 (ref 5–15)
BUN: 27 mg/dL — ABNORMAL HIGH (ref 8–23)
CO2: 20 mmol/L — ABNORMAL LOW (ref 22–32)
Calcium: 8.9 mg/dL (ref 8.9–10.3)
Chloride: 106 mmol/L (ref 98–111)
Creatinine, Ser: 1.71 mg/dL — ABNORMAL HIGH (ref 0.61–1.24)
GFR, Estimated: 39 mL/min — ABNORMAL LOW (ref >60)
Glucose, Bld: 214 mg/dL — ABNORMAL HIGH (ref 70–99)
Potassium: 4.5 mmol/L (ref 3.5–5.1)
Sodium: 135 mmol/L (ref 135–145)
Total Bilirubin: 0.8 mg/dL (ref 0.3–1.2)
Total Protein: 5.6 g/dL — ABNORMAL LOW (ref 6.5–8.1)

## 2020-09-25 LAB — CBC WITH DIFFERENTIAL/PLATELET
Abs Immature Granulocytes: 0.04 10*3/uL (ref 0.00–0.07)
Basophils Absolute: 0 10*3/uL (ref 0.0–0.1)
Basophils Relative: 0 %
Eosinophils Absolute: 0.1 10*3/uL (ref 0.0–0.5)
Eosinophils Relative: 1 %
HCT: 32.3 % — ABNORMAL LOW (ref 39.0–52.0)
Hemoglobin: 10.5 g/dL — ABNORMAL LOW (ref 13.0–17.0)
Immature Granulocytes: 0 %
Lymphocytes Relative: 22 %
Lymphs Abs: 2.8 10*3/uL (ref 0.7–4.0)
MCH: 30.8 pg (ref 26.0–34.0)
MCHC: 32.5 g/dL (ref 30.0–36.0)
MCV: 94.7 fL (ref 80.0–100.0)
Monocytes Absolute: 0.8 10*3/uL (ref 0.1–1.0)
Monocytes Relative: 7 %
Neutro Abs: 9.1 10*3/uL — ABNORMAL HIGH (ref 1.7–7.7)
Neutrophils Relative %: 70 %
Platelets: 257 10*3/uL (ref 150–400)
RBC: 3.41 MIL/uL — ABNORMAL LOW (ref 4.22–5.81)
RDW: 13.4 % (ref 11.5–15.5)
WBC: 12.8 10*3/uL — ABNORMAL HIGH (ref 4.0–10.5)
nRBC: 0 % (ref 0.0–0.2)

## 2020-09-25 LAB — MAGNESIUM: Magnesium: 1.9 mg/dL (ref 1.7–2.4)

## 2020-09-25 LAB — GLUCOSE, CAPILLARY
Glucose-Capillary: 163 mg/dL — ABNORMAL HIGH (ref 70–99)
Glucose-Capillary: 206 mg/dL — ABNORMAL HIGH (ref 70–99)
Glucose-Capillary: 169 mg/dL — ABNORMAL HIGH (ref 70–99)
Glucose-Capillary: 173 mg/dL — ABNORMAL HIGH (ref 70–99)

## 2020-09-25 LAB — HEMOGLOBIN A1C
Hgb A1c MFr Bld: 10.5 % — ABNORMAL HIGH (ref 4.8–5.6)
Mean Plasma Glucose: 254.65 mg/dL

## 2020-09-25 LAB — PHOSPHORUS: Phosphorus: 2.9 mg/dL (ref 2.5–4.6)

## 2020-09-25 MED ORDER — GLIMEPIRIDE 4 MG PO TABS
4.0000 mg | ORAL_TABLET | Freq: Every day | ORAL | Status: DC
  Administered 2020-09-25 – 2020-09-26 (×2): 4 mg via ORAL
  Filled 2020-09-25 (×2): qty 1

## 2020-09-25 MED ORDER — GLIMEPIRIDE 2 MG PO TABS: 2.0000 mg | ORAL_TABLET | Freq: Two times a day (BID) | ORAL | Status: DC

## 2020-09-25 MED ORDER — INSULIN ASPART 100 UNIT/ML ~~LOC~~ SOLN
5.0000 [IU] | Freq: Once | SUBCUTANEOUS | Status: AC
  Administered 2020-09-25: 5 [IU] via SUBCUTANEOUS
  Filled 2020-09-25: qty 0.05

## 2020-09-25 MED ORDER — HEPARIN SODIUM (PORCINE) 5000 UNIT/ML IJ SOLN
5000.0000 [IU] | Freq: Three times a day (TID) | INTRAMUSCULAR | Status: DC
  Administered 2020-09-25 – 2020-09-26 (×4): 5000 [IU] via SUBCUTANEOUS
  Filled 2020-09-25 (×4): qty 1

## 2020-09-25 MED ORDER — BLOOD GLUCOSE METER KIT: PACK | 0 refills | Status: DC

## 2020-09-25 MED ORDER — SODIUM BICARBONATE 650 MG PO TABS
1300.0000 mg | ORAL_TABLET | Freq: Every day | ORAL | Status: DC
  Administered 2020-09-25 – 2020-09-26 (×2): 1300 mg via ORAL
  Filled 2020-09-25 (×2): qty 2

## 2020-09-25 MED ORDER — LINAGLIPTIN 5 MG PO TABS
5.0000 mg | ORAL_TABLET | Freq: Every day | ORAL | Status: DC
  Administered 2020-09-25 – 2020-09-26 (×2): 5 mg via ORAL
  Filled 2020-09-25 (×2): qty 1

## 2020-09-25 MED ORDER — GLIMEPIRIDE 4 MG PO TABS
4.0000 mg | ORAL_TABLET | Freq: Every day | ORAL | Status: DC
  Filled 2020-09-25: qty 1

## 2020-09-25 NOTE — Progress Notes (Signed)
PROGRESS NOTE    Erik Moss  BCW:[id number] DOB: 05-28-1933 DOA: 09/24/2020 PCP: [name], FNP   Chief Complaint  Patient presents with  . Hyperglycemia    Brief Narrative: 86 y.o.malewith medical historyhypertension, diabetes mellitus type 2 controlled with oral medication, previous CVA without any residual hemiparesis who was seen by his dermatologist on Monday last week due to generalized rash which maculopapular. He was started on prednisone taper which patient started taking on Tuesday. He took 7 tablets of 5 mg and on Wednesday 6 tablets of 5 mg. Patient started noticing increasing urinary frequency and excessive thirst anxiety C his primary provider yesterday. After the visit and blood work, he was called about late evening/night to inform him of markedly elevated blood glucose of greater than 400 and was advised  He denied any chest pain, palpitation or diaphoresis. He also denies any fever, chills or shortness of breath. No headaches or dizziness Patient has significant memory lapses and most of his history provided during diarrhea show some discrepancy with what patient's daughter told me to live with patient in the same room. Daughter also reported that patient is taking bottles of her medication along with pain and at the time has requested to [facility] out of the [facility] and go live in assisted living condition. In the ED, vitals were heart rate of 87, respiratory rate of 11, blood pressure 182/82 and saturating above 90% on room air. Serum chemistry showed sodium of 130, potassium of 4.7, chloride of 97, CO2 of 22, blood sugar of 441, BUN of 37 and creatinine of 2.21. Anion gap was 11. CBC showed WBC of 17.6, hemoglobin 11.3 and hematocrit 37.7. Patient received 5 units of insulin and 500 cc bolus of fluid in the ED. At time of exam, patient is alert and oriented x3 but with noticeable cognitive impairment. He is not in acute distress and keep being repetitive  with discussion and unable to corroborate history very well. I called and spoke with her daughter [name] who gave me detailed account of history and provided patients primary provider who is [facility]  Subjective: Patient has been doing well walked with physical therapy blood sugar improving.  Blood pressure labile   Assessment & Plan:  Hyperglycemia due to diabetes mellitus uncontrolled, likely from prednisone use.  Blood sugar now is stabilized with sliding scale insulin.  Seen by diabetic educator, dietitian. Also blood sugar poorly controlled due to memory issues/poor compliance.  Patient was provided with a glucometer kit.A1c poorly controlled 10.5. Continues to see PCP.  Do not advise insulin at this time given his memory issues.  Cognitivie impairment/possible dementia: Per the daughter patient has been dealing with memory issues since his early 9s and daughter is having difficulty taking care of him as she also has a daughter with mental health issue.  She wants to pursue assisted living facility [facility] consulted.    AKI on CKD IIIb: Baseline creatinine around 1.7.On admission 2.2 improved to 1.7. Right thalamic infarct/stroke 2020 continue his Plavix,statin. Essential hypertension: blood pressure poorly controlled,resume home medication. Monitor blood pressure at home and follow-up with PCP. BPH continue Flomax mirabegron WTU:UEKC on statin.  Patient daughter has requested that we seek assisted living facility and not discharge home today-TOC consulted.  If unable to go to assisted facility she may need to take him home over the weekend and she has agreed. Nutrition: Diet Order            Diet Carb Modified  Diet Carb Modified Fluid consistency: Thin; Room service appropriate? Yes  Diet effective now                 Nutrition Problem: Altered nutrition lab value Etiology: other (see comment) (patient with hx of type 2 DM not checking CBGs x15  months) Signs/Symptoms: other (comment) (admission for hyperglycemia and HgbA1c: 10.5%) Interventions: Refer to RD note for recommendations  Body mass index is 25.5 kg/m.  DVT prophylaxis: heparin injection 5,000 Units Start: 09/25/20 0845 Code Status:   Code Status: Prior  Family Communication: plan of care discussed with patient at bedside.  Status is: Admitted as Observation. Patient remains hospitalized due to unsafe disposition. Dispo: The patient is from: Home              Anticipated d/c is to: ALF              Anticipated d/c date is: 1 day              Patient currently is medically stable to d/c.  Consultants:see note  Procedures:see note  Culture/Microbiology    Component Value Date/Time   SDES BLOOD RIGHT HAND 10/09/2019 1154   SDES BLOOD LEFT HAND 10/09/2019 1154   SPECREQUEST  10/09/2019 1154    BOTTLES DRAWN AEROBIC ONLY Blood Culture adequate volume   SPECREQUEST  10/09/2019 1154    BOTTLES DRAWN AEROBIC ONLY Blood Culture adequate volume   CULT  10/09/2019 1154    NO GROWTH 5 DAYS Performed at [facility], [place] [address], [place], [place] [address]    CULT  10/09/2019 1154    NO GROWTH 5 DAYS Performed at [facility], [place] [address] [place], [place] [address]    REPTSTATUS 10/14/2019 FINAL 10/09/2019 1154   REPTSTATUS 10/14/2019 FINAL 10/09/2019 1154    Other culture-see note  Medications: Scheduled Meds: . amLODipine  5 mg Oral Daily  . atorvastatin  40 mg Oral Daily  . clopidogrel  75 mg Oral Daily  . ferrous gluconate  648 mg Oral Daily  . glimepiride  4 mg Oral Q breakfast  . heparin injection (subcutaneous)  5,000 Units Subcutaneous Q8H  . insulin aspart  0-9 Units Subcutaneous TID WC  . linagliptin  5 mg Oral Daily  . mirabegron ER  50 mg Oral Daily  . multivitamin with minerals  1 tablet Oral Daily  . ondansetron (ZOFRAN) IV  4 mg Intravenous Q6H  . polyethylene glycol  17 g Oral BID  . sodium bicarbonate  1,300 mg Oral  Daily  . tamsulosin  0.4 mg Oral QHS   Continuous Infusions:  Antimicrobials: Anti-infectives (From admission, onward)   None     Objective: Vitals: Today's Vitals   09/25/20 0915 09/25/20 1225 09/25/20 1332 09/25/20 1521  BP:  (!) 176/85  137/77  Pulse:  74    Resp:  18    Temp:  97.9 F (36.6 C)    TempSrc:      SpO2:  99%    Weight:      Height:      PainSc: 0-No pain  0-No pain     Intake/Output Summary (Last 24 hours) at 09/25/2020 1748 Last data filed at 09/25/2020 1522 Gross per 24 hour  Intake 1230 ml  Output 2300 ml  Net -1070 ml   Filed Weights   09/24/20 1346  Weight: 71.7 kg   Weight change:   Intake/Output from previous day: 12/02 0701 - 12/03 0700 In: 1250 [  I.V.:750; IV Piggyback:500] Out: 1200 [Urine:1200] Intake/Output this shift: Total I/O In: 480 [P.O.:480] Out: 1100 [Urine:1100]  Examination: General exam: AAOx2-3 ,NAD, weak appearing. HEENT:Oral mucosa moist, Ear/Nose WNL grossly,dentition normal. Respiratory system: bilaterally CLEAR,no wheezing or crackles,no use of accessory muscle, non tender. Cardiovascular system: S1 & S2 +, regular, No JVD. Gastrointestinal system: Abdomen soft, NT,ND, BS+. Nervous System:Alert, awake, moving extremities and grossly nonfocal Extremities: No edema, distal peripheral pulses palpable.  Skin: No rashes,no icterus. MSK: Normal muscle bulk,tone, power  Data Reviewed: I have personally reviewed following labs and imaging studies CBC: Recent Labs  Lab 09/24/20 1234 09/25/20 0541  WBC 17.8* 12.8*  NEUTROABS  --  9.1*  HGB 11.2* 10.5*  HCT 33.7* 32.3*  MCV 93.9 94.7  PLT 283 657   Basic Metabolic Panel: Recent Labs  Lab 09/24/20 1234 09/25/20 0541  NA 130* 135  K 4.7 4.5  CL 97* 106  CO2 22 20*  GLUCOSE 441* 214*  BUN 37* 27*  CREATININE 2.21* 1.71*  CALCIUM 9.2 8.9  MG  --  1.9  PHOS  --  2.9   GFR: Estimated Creatinine Clearance: 28.5 mL/min (A) (by C-G formula based on SCr of  1.71 mg/dL (H)). Liver Function Tests: Recent Labs  Lab 09/25/20 0541  AST 14*  ALT 18  ALKPHOS 55  BILITOT 0.8  PROT 5.6*  ALBUMIN 3.4*   No results for input(s): LIPASE, AMYLASE in the last 168 hours. No results for input(s): AMMONIA in the last 168 hours. Coagulation Profile: No results for input(s): INR, PROTIME in the last 168 hours. Cardiac Enzymes: No results for input(s): CKTOTAL, CKMB, CKMBINDEX, TROPONINI in the last 168 hours. BNP (last 3 results) No results for input(s): PROBNP in the last 8760 hours. HbA1C: Recent Labs    09/24/20 1234 09/25/20 0541  HGBA1C 10.5* 10.5*   CBG: Recent Labs  Lab 09/24/20 1558 09/24/20 2327 09/25/20 0750 09/25/20 1106 09/25/20 1728  GLUCAP 343* 310* 163* 206* 169*   Lipid Profile: No results for input(s): CHOL, HDL, LDLCALC, TRIG, CHOLHDL, LDLDIRECT in the last 72 hours. Thyroid Function Tests: No results for input(s): TSH, T4TOTAL, FREET4, T3FREE, THYROIDAB in the last 72 hours. Anemia Panel: No results for input(s): VITAMINB12, FOLATE, FERRITIN, TIBC, IRON, RETICCTPCT in the last 72 hours. Sepsis Labs: No results for input(s): PROCALCITON, LATICACIDVEN in the last 168 hours.  Recent Results (from the past 240 hour(s))  Resp Panel by RT-PCR (Flu A&B, Covid) Nasopharyngeal Swab     Status: None   Collection Time: 09/24/20  4:23 PM   Specimen: Nasopharyngeal Swab; Nasopharyngeal(NP) swabs in vial transport medium  Result Value Ref Range Status   SARS Coronavirus 2 by RT PCR NEGATIVE NEGATIVE Final    Comment: (NOTE) SARS-CoV-2 target nucleic acids are NOT DETECTED.  The SARS-CoV-2 RNA is generally detectable in upper respiratory specimens during the acute phase of infection. The lowest concentration of SARS-CoV-2 viral copies this assay can detect is 138 copies/mL. A negative result does not preclude SARS-Cov-2 infection and should not be used as the sole basis for treatment or other patient management decisions. A  negative result may occur with  improper specimen collection/handling, submission of specimen other than nasopharyngeal swab, presence of viral mutation(s) within the areas targeted by this assay, and inadequate number of viral copies(<138 copies/mL). A negative result must be combined with clinical observations, patient history, and epidemiological information. The expected result is Negative.  Fact Sheet for Patients:  EntrepreneurPulse.com.au  Fact Sheet for Healthcare Providers:  IncredibleEmployment.be  This test is no t yet approved or cleared by the [place] and  has been authorized for detection and/or diagnosis of SARS-CoV-2 by FDA under an Emergency Use Authorization (EUA). This EUA will remain  in effect (meaning this test can be used) for the duration of the COVID-19 declaration under Section 564(b)(1) of the Act, 21 U.S.C.section 360bbb-3(b)(1), unless the authorization is terminated  or revoked sooner.       Influenza A by PCR NEGATIVE NEGATIVE Final   Influenza B by PCR NEGATIVE NEGATIVE Final    Comment: (NOTE) The Xpert Xpress SARS-CoV-2/FLU/RSV plus assay is intended as an aid in the diagnosis of influenza from Nasopharyngeal swab specimens and should not be used as a sole basis for treatment. Nasal washings and aspirates are unacceptable for Xpert Xpress SARS-CoV-2/FLU/RSV testing.  Fact Sheet for Patients: EntrepreneurPulse.com.au  Fact Sheet for Healthcare Providers: IncredibleEmployment.be  This test is not yet approved or cleared by the [place] FDA and has been authorized for detection and/or diagnosis of SARS-CoV-2 by FDA under an Emergency Use Authorization (EUA). This EUA will remain in effect (meaning this test can be used) for the duration of the COVID-19 declaration under Section 564(b)(1) of the Act, 21 U.S.C. section 360bbb-3(b)(1), unless the authorization  is terminated or revoked.  Performed at [facility], [place] [address] [place], [place] [address]      Radiology Studies: DG Chest 2 View  Result Date: 09/24/2020 CLINICAL DATA:  86 year old male with leukocytosis. EXAM: CHEST - 2 VIEW COMPARISON:  None. FINDINGS: The heart size and mediastinal contours are within normal limits. Both lungs are clear. The visualized skeletal structures are unremarkable. IMPRESSION: No active cardiopulmonary disease. Electronically Signed   By: Anner Crete M.D.   On: 09/24/2020 16:30     LOS: 0 days   [name], MD [facility]  09/25/2020, 5:48 PM

## 2020-09-25 NOTE — Care Management Obs Status (Signed)
[facility] NOTIFICATION   Patient Details  Name: Erik Moss MRN: [id number] Date of Birth: 1933/03/20   Medicare Observation Status Notification Given:  Yes    MahabirJuliann Pulse, RN 09/25/2020, 12:21 PM

## 2020-09-25 NOTE — Progress Notes (Signed)
Initial Nutrition Assessment  DOCUMENTATION CODES:   Not applicable  INTERVENTION:  - discussed the importance of checking CBGs, portion sizes, and the affect of food on CBGs.  - consult to DM Coordinator with approval of MD.    NUTRITION DIAGNOSIS:   Altered nutrition lab value related to other (see comment) (patient with hx of type 2 DM not checking CBGs x15 months) as evidenced by other (comment) (admission for hyperglycemia and HgbA1c: 10.5%).  GOAL:   Patient will meet greater than or equal to 90% of their needs  MONITOR:   PO intake, Labs, Weight trends  REASON FOR ASSESSMENT:   Malnutrition Screening Tool  ASSESSMENT:   86 y.o. male with medical history of HTN, type 2 DM controlled with oral medication, previous CVA without any residual hemiparesis or deficits. He saw a Dermatologist on 11/22 due to rash and was started on a prednisone taper. He noticed increased urinary frequency, excessive thirst, and anxiety so he went to his PCP on 12/1. He was called that evening due to concern for CBG >400 mg/dl, and was advised to go to the ED.  No intakes documented since admission. Visualized lunch tray with 90% completion. Patient reports good appetite now and PTA with no recent changes. He receives [employer], a delivery food service that is available to those with Medicare and/or Medicaid. Patient is concerned that an mandarin chicken dish is what led to the rash on legs and that that meal and breakfast meal that has a sweet sauce are what are causing hyperglycemia.   [place] and has been living with his daughter and granddaughter since that time. He is interested in finding a place to live close to [employer] after d/c so that he can live on his own and walk to the university to take classes and eat at the cafeterias there.   He states that since the move 15 months ago, he has not had supplies to check CBGs and that he has not been  checking CBGs during that time.  Weight yesterday was 158 lb and weight appears to mainly be stable/slightly up since 05/08/19.     Labs reviewed; HgbA1c: 10.5%, CBGs: 163-206 mg/dl, BUN: 27 mg/dl, BUN: 27 mg/dl, creatinine: 1.71 mg/dl, GFR: 39 ml/min. Medications reviewed; sliding scale novolog, 5 units novolog x1 dose 12/3, 5 mg tradjenta/day, 1 tablet multivitamin with minerals/day, 17 g miralax BID, 1300 mg sodium bicarb/day.    NUTRITION - FOCUSED PHYSICAL EXAM:  completed; mild muscle depletions to clavicle and acromion process areas, no other muscle depletions, no noted fat depletions.   Diet Order:   Diet Order            Diet Carb Modified Fluid consistency: Thin; Room service appropriate? Yes  Diet effective now                 EDUCATION NEEDS:   Education needs have been addressed  Skin:  Skin Assessment: Reviewed RN Assessment  Last BM:  12/2  Height:   Ht Readings from Last 1 Encounters:  09/24/20 5\' 6"  (1.676 m)    Weight:   Wt Readings from Last 1 Encounters:  09/24/20 71.7 kg     Estimated Nutritional Needs:  Kcal:  1500-1700 kcal Protein:  75-85 grams Fluid:  >/= 1.7 L/day      Jarome Matin, MS, RD, LDN, CNSC Inpatient Clinical Dietitian RD pager # available in [facility]  After hours/weekend pager # available  in [facility]

## 2020-09-25 NOTE — Discharge Summary (Signed)
Physician Discharge Summary  Erik Moss ZWC:[id number] DOB: [birth year] DOA: 09/24/2020  PCP: [name], FNP  Admit date: 09/24/2020 Discharge date: 09/26/2020  Admitted From: home Disposition:  home  Recommendations for Outpatient Follow-up:  1. Follow up with PCP in 1-2 weeks 2. Please obtain BMP/CBC in one week 3. Please follow up on the following pending results:  Home Health:none  Equipment/Devices: none  Discharge Condition: Stable Code Status:   Code Status: Prior Diet recommendation:  Diet Order            Diet Carb Modified           Diet Carb Modified Fluid consistency: Thin; Room service appropriate? Yes  Diet effective now                  Brief/Interim Summary:  86 y.o. male with medical history hypertension, diabetes mellitus type 2 controlled with oral medication, previous CVA without any residual hemiparesis who was seen by his dermatologist on Monday last week due to generalized rash which maculopapular.  He was started on prednisone taper which patient started taking on Tuesday.  He took 7 tablets of 5 mg and on Wednesday 6 tablets of 5 mg. Patient started noticing increasing urinary frequency and excessive thirst anxiety C his primary provider yesterday.  After the visit and blood work, he was called about late evening/night to inform him of markedly elevated blood glucose of greater than 400 and was advised  He denied any chest pain, palpitation or diaphoresis.  He also denies any fever, chills or shortness of breath.  No headaches or dizziness Patient has significant memory lapses and most of his history provided during diarrhea show some discrepancy with what patient's daughter told me to live with patient in the same room. Daughter also reported that patient is taking bottles of her medication along with pain and at the time has requested to move out of the [facility] and go live in assisted living condition. In the ED, vitals were heart rate of  87, respiratory rate of 11, blood pressure 182/82 and saturating above 90% on room air. Serum chemistry showed sodium of 130, potassium of 4.7, chloride of 97, CO2 of 22, blood sugar of 441, BUN of 37 and creatinine of 2.21.  Anion gap was 11. CBC showed WBC of 17.6, hemoglobin 11.3 and hematocrit 37.7. Patient received 5 units of insulin and 500 cc bolus of fluid in the ED. At time of exam, patient is alert and oriented x3 but with noticeable cognitive impairment.  He is not in acute distress and keep being repetitive with discussion and unable to corroborate history very well. I called and spoke with her daughter [name] who gave me detailed account of history and provided patients primary provider who is Jillyn Ledger with [facility] physician Patient was admitted blood sugar Dr Lajuan Lines improved with sliding scale and now back on home medication.  Seen by PT OT and did well and no further needs blood patient's daughter is interested in second assistance with assisted living facility treatments current improvement, as per social worker patient will need to follow-up with PCP tolerating that well set up home health upon discharge.  Discharge Diagnoses:   Hyperglycemia due to diabetes mellitus uncontrolled, likely from prednisone use.  Blood sugar now is stabilized.Seen by diabetic educator, dietitian.Patient was provided with a glucometer kit RX. A1c poorly controlled 10.5.  Patient will need to follow-up with PCP continue his current regimen glipizide.Do not advise insulin at this time  given his memory issues. Recent Labs  Lab 09/25/20 1106 09/25/20 1728 09/25/20 2101 09/26/20 0800 09/26/20 1150  GLUCAP 206* 169* 173* 194* 256*   AKI on CKD IIIb: Baseline creatinine around 1.7.On admission 2.2 improved to 1.7.  Stable. Right thalamic infarct/stroke 2020 continue his Plavix, statin. Essential hypertension blood pressure at times uncontrolled, MEDS resumed on,blood pressure has stabilized.He will  follow up with PCP.   BPH continue Flomax mirabegron XQJ:JHER on statin.  Consults: PT OT  Subjective: Alert awake resting comfortably.  Denies nausea vomiting.  Tolerating well.  Discharge Exam: Vitals:   09/26/20 0613 09/26/20 0908  BP: (!) 165/92 132/77  Pulse: 74   Resp: 18   Temp: (!) 97.5 F (36.4 C)   SpO2: 100%    General: Pt is alert awake oriented to place people Cardiovascular: RRR, S1/S2 +, no rubs, no gallops Respiratory: CTA bilaterally, no wheezing, no rhonchi Abdominal: Soft, NT, ND, bowel sounds + Extremities: no edema, no cyanosis  Discharge Instructions  Discharge Instructions    Diet Carb Modified   Complete by: As directed    Discharge instructions   Complete by: As directed    Check blood sugar 3 times a day and bedtime at home. If blood sugar running above 200 less than 70 please call your MD to adjust insulin. If blood sugars running less 100 do not use insulin and call MD. If you noticed signs and symptoms of hypoglycemia or low blood sugar like jitteriness, confusion, thirst, tremor, sweating- Check blood sugar, drink sugary drink/biscuits/sweets to increase sugar level and call MD or return to ER.   Increase activity slowly   Complete by: As directed      Allergies as of 09/26/2020   No Known Allergies     Medication List    TAKE these medications   amLODipine 5 MG tablet Commonly known as: NORVASC Take 5 mg by mouth daily.   atorvastatin 40 MG tablet Commonly known as: Lipitor Take 1 tablet (40 mg total) by mouth daily.   blood glucose meter kit and supplies Dispense based on patient and insurance preference. Use up to four times daily as directed. (FOR ICD-10 E10.9, E11.9).   clopidogrel 75 MG tablet Commonly known as: PLAVIX Take 75 mg by mouth daily.   DULCOLAX PO Take 1 tablet by mouth in the morning and at bedtime.   ferrous gluconate 324 MG tablet Commonly known as: FERGON Take 648 mg by mouth daily.   glimepiride  2 MG tablet Commonly known as: AMARYL Take 2 mg by mouth in the morning and at bedtime.   multivitamin with minerals Tabs tablet Take 1 tablet by mouth daily.   Myrbetriq 50 MG Tb24 tablet Generic drug: mirabegron ER Take 50 mg by mouth daily.   polyethylene glycol 17 g packet Commonly known as: MIRALAX / GLYCOLAX Take 17 g by mouth 2 (two) times daily.   Protein Powd Take 1 each by mouth daily.   sitaGLIPtin 25 MG tablet Commonly known as: JANUVIA Take 25 mg by mouth daily.   sodium bicarbonate 650 MG tablet Take 1,300 mg by mouth daily.   tamsulosin 0.4 MG Caps capsule Commonly known as: FLOMAX Take 0.4 mg by mouth at bedtime.   triamcinolone ointment 0.1 % Commonly known as: KENALOG Apply 1 application topically daily as needed (itching on lower legs.).   triamcinolone 0.1 % Commonly known as: KENALOG Apply 1 application topically daily as needed (itching).       Follow-up Information  [name], FNP Follow up in 1 week(s).   Specialty: Family Medicine Contact information: [place] [address] [phone]              No Known Allergies  The results of significant diagnostics from this hospitalization (including imaging, microbiology, ancillary and laboratory) are listed below for reference.    Microbiology: Recent Results (from the past 240 hour(s))  Urine culture     Status: Abnormal   Collection Time: 09/24/20  2:39 PM   Specimen: Urine, Random  Result Value Ref Range Status   Specimen Description   Final    URINE, RANDOM Performed at [facility] [address], [place], [place] [address]    Special Requests   Final    NONE Performed at [facility], [place] [address] [place], [place] [address]    Culture (A)  Final    <10,000 COLONIES/mL INSIGNIFICANT GROWTH Performed at [facility] [address] [place], [place] [address]    Report Status 09/26/2020 FINAL   Final  Resp Panel by RT-PCR (Flu A&B, Covid) Nasopharyngeal Swab     Status: None   Collection Time: 09/24/20  4:23 PM   Specimen: Nasopharyngeal Swab; Nasopharyngeal(NP) swabs in vial transport medium  Result Value Ref Range Status   SARS Coronavirus 2 by RT PCR NEGATIVE NEGATIVE Final    Comment: (NOTE) SARS-CoV-2 target nucleic acids are NOT DETECTED.  The SARS-CoV-2 RNA is generally detectable in upper respiratory specimens during the acute phase of infection. The lowest concentration of SARS-CoV-2 viral copies this assay can detect is 138 copies/mL. A negative result does not preclude SARS-Cov-2 infection and should not be used as the sole basis for treatment or other patient management decisions. A negative result may occur with  improper specimen collection/handling, submission of specimen other than nasopharyngeal swab, presence of viral mutation(s) within the areas targeted by this assay, and inadequate number of viral copies(<138 copies/mL). A negative result must be combined with clinical observations, patient history, and epidemiological information. The expected result is Negative.  Fact Sheet for Patients:  EntrepreneurPulse.com.au  Fact Sheet for Healthcare Providers:  IncredibleEmployment.be  This test is no t yet approved or cleared by the [place] FDA and  has been authorized for detection and/or diagnosis of SARS-CoV-2 by FDA under an Emergency Use Authorization (EUA). This EUA will remain  in effect (meaning this test can be used) for the duration of the COVID-19 declaration under Section 564(b)(1) of the Act, 21 U.S.C.section 360bbb-3(b)(1), unless the authorization is terminated  or revoked sooner.       Influenza A by PCR NEGATIVE NEGATIVE Final   Influenza B by PCR NEGATIVE NEGATIVE Final    Comment: (NOTE) The Xpert Xpress SARS-CoV-2/FLU/RSV plus assay is intended as an aid in the diagnosis of influenza from  Nasopharyngeal swab specimens and should not be used as a sole basis for treatment. Nasal washings and aspirates are unacceptable for Xpert Xpress SARS-CoV-2/FLU/RSV testing.  Fact Sheet for Patients: EntrepreneurPulse.com.au  Fact Sheet for Healthcare Providers: IncredibleEmployment.be  This test is not yet approved or cleared by the [place] FDA and has been authorized for detection and/or diagnosis of SARS-CoV-2 by FDA under an Emergency Use Authorization (EUA). This EUA will remain in effect (meaning this test can be used) for the duration of the COVID-19 declaration under Section 564(b)(1) of the Act, 21 U.S.C. section 360bbb-3(b)(1), unless the authorization is terminated or revoked.  Performed at  [facility], [place] [address], [place], [place] [address]     Procedures/Studies: DG Chest 2 View  Result Date: 09/24/2020 CLINICAL DATA:  86 year old male with leukocytosis. EXAM: CHEST - 2 VIEW COMPARISON:  None. FINDINGS: The heart size and mediastinal contours are within normal limits. Both lungs are clear. The visualized skeletal structures are unremarkable. IMPRESSION: No active cardiopulmonary disease. Electronically Signed   By: Anner Crete M.D.   On: 09/24/2020 16:30    Labs: BNP (last 3 results) No results for input(s): BNP in the last 8760 hours. Basic Metabolic Panel: Recent Labs  Lab 09/24/20 1234 09/25/20 0541  NA 130* 135  K 4.7 4.5  CL 97* 106  CO2 22 20*  GLUCOSE 441* 214*  BUN 37* 27*  CREATININE 2.21* 1.71*  CALCIUM 9.2 8.9  MG  --  1.9  PHOS  --  2.9   Liver Function Tests: Recent Labs  Lab 09/25/20 0541  AST 14*  ALT 18  ALKPHOS 55  BILITOT 0.8  PROT 5.6*  ALBUMIN 3.4*   No results for input(s): LIPASE, AMYLASE in the last 168 hours. No results for input(s): AMMONIA in the last 168 hours. CBC: Recent Labs  Lab 09/24/20 1234 09/25/20 0541  WBC 17.8* 12.8*  NEUTROABS  --   9.1*  HGB 11.2* 10.5*  HCT 33.7* 32.3*  MCV 93.9 94.7  PLT 283 257   Cardiac Enzymes: No results for input(s): CKTOTAL, CKMB, CKMBINDEX, TROPONINI in the last 168 hours. BNP: Invalid input(s): POCBNP CBG: Recent Labs  Lab 09/25/20 1106 09/25/20 1728 09/25/20 2101 09/26/20 0800 09/26/20 1150  GLUCAP 206* 169* 173* 194* 256*   D-Dimer No results for input(s): DDIMER in the last 72 hours. Hgb A1c Recent Labs    09/24/20 1234 09/25/20 0541  HGBA1C 10.5* 10.5*   Lipid Profile No results for input(s): CHOL, HDL, LDLCALC, TRIG, CHOLHDL, LDLDIRECT in the last 72 hours. Thyroid function studies No results for input(s): TSH, T4TOTAL, T3FREE, THYROIDAB in the last 72 hours.  Invalid input(s): FREET3 Anemia work up No results for input(s): VITAMINB12, FOLATE, FERRITIN, TIBC, IRON, RETICCTPCT in the last 72 hours. Urinalysis    Component Value Date/Time   COLORURINE COLORLESS (A) 09/24/2020 1439   APPEARANCEUR CLEAR 09/24/2020 1439   LABSPEC 1.006 09/24/2020 1439   PHURINE 6.0 09/24/2020 1439   GLUCOSEU >=500 (A) 09/24/2020 1439   HGBUR SMALL (A) 09/24/2020 1439   BILIRUBINUR NEGATIVE 09/24/2020 1439   KETONESUR NEGATIVE 09/24/2020 1439   PROTEINUR NEGATIVE 09/24/2020 1439   NITRITE NEGATIVE 09/24/2020 1439   LEUKOCYTESUR NEGATIVE 09/24/2020 1439   Sepsis Labs Invalid input(s): PROCALCITONIN,  WBC,  LACTICIDVEN Microbiology Recent Results (from the past 240 hour(s))  Urine culture     Status: Abnormal   Collection Time: 09/24/20  2:39 PM   Specimen: Urine, Random  Result Value Ref Range Status   Specimen Description   Final    URINE, RANDOM Performed at [facility], [place] [address], [place], [place] [address]    Special Requests   Final    NONE Performed at [facility], [place] [address] [place], [place] [address]    Culture (A)  Final    <10,000 COLONIES/mL INSIGNIFICANT GROWTH Performed at [facility] [address], [place], [place] [address]    Report Status 09/26/2020 FINAL  Final  Resp Panel by RT-PCR (Flu A&B, Covid) Nasopharyngeal Swab     Status: None   Collection Time: 09/24/20  4:23 PM   Specimen: Nasopharyngeal Swab; Nasopharyngeal(NP) swabs in vial transport medium  Result Value Ref Range Status   SARS Coronavirus 2 by RT PCR NEGATIVE NEGATIVE Final    Comment: (NOTE) SARS-CoV-2 target nucleic acids are NOT DETECTED.  The SARS-CoV-2 RNA is generally detectable in upper respiratory specimens during the acute phase of infection. The lowest concentration of SARS-CoV-2 viral copies this assay can detect is 138 copies/mL. A negative result does not preclude SARS-Cov-2 infection and should not be used as the sole basis for treatment or other patient management decisions. A negative result may occur with  improper specimen collection/handling, submission of specimen other than nasopharyngeal swab, presence of viral mutation(s) within the areas targeted by this assay, and inadequate number of viral copies(<138 copies/mL). A negative result must be combined with clinical observations, patient history, and epidemiological information. The expected result is Negative.  Fact Sheet for Patients:  EntrepreneurPulse.com.au  Fact Sheet for Healthcare Providers:  IncredibleEmployment.be  This test is no t yet approved or cleared by the [place] FDA and  has been authorized for detection and/or diagnosis of SARS-CoV-2 by FDA under an Emergency Use Authorization (EUA). This EUA will remain  in effect (meaning this test can be used) for the duration of the COVID-19 declaration under Section 564(b)(1) of the Act, 21 U.S.C.section 360bbb-3(b)(1), unless the authorization is terminated  or revoked sooner.       Influenza A by PCR NEGATIVE NEGATIVE Final   Influenza B by PCR NEGATIVE NEGATIVE Final    Comment: (NOTE) The Xpert Xpress SARS-CoV-2/FLU/RSV  plus assay is intended as an aid in the diagnosis of influenza from Nasopharyngeal swab specimens and should not be used as a sole basis for treatment. Nasal washings and aspirates are unacceptable for Xpert Xpress SARS-CoV-2/FLU/RSV testing.  Fact Sheet for Patients: EntrepreneurPulse.com.au  Fact Sheet for Healthcare Providers: IncredibleEmployment.be  This test is not yet approved or cleared by the [place] FDA and has been authorized for detection and/or diagnosis of SARS-CoV-2 by FDA under an Emergency Use Authorization (EUA). This EUA will remain in effect (meaning this test can be used) for the duration of the COVID-19 declaration under Section 564(b)(1) of the Act, 21 U.S.C. section 360bbb-3(b)(1), unless the authorization is terminated or revoked.  Performed at [facility], [place] [address], [place], [place] [address]      Time coordinating discharge: 25  minutes  SIGNED: [name], MD  [facility] 09/26/2020, 1:05 PM  If 7PM-7AM, please contact night-coverage [email or web address]

## 2020-09-25 NOTE — Progress Notes (Signed)
Inpatient Diabetes Program Recommendations  AACE/ADA: New Consensus Statement on Inpatient Glycemic Control (2015)  Target Ranges:  Prepandial:   less than 140 mg/dL      Peak postprandial:   less than 180 mg/dL (1-2 hours)      Critically ill patients:  140 - 180 mg/dL   Lab Results  Component Value Date   GLUCAP 206 (H) 09/25/2020   HGBA1C 10.5 (H) 09/25/2020    Review of Glycemic Control  Diabetes history: DM2 Outpatient Diabetes medications: Amaryl 2 mg bid, Januvia 25 mg QD Current orders for Inpatient glycemic control: Novolog 0-9 units tidwc, tradjenta 5 mg QD, Amaryl 4 mg QAM  HgbA1C - 10.5% (average blood sugar 255 mg/dL) HgbA1C was 7.7% in June 2021. Doubtful HgbA1C is accurate with low H/H Had been on Prednisone taper prior to admission  Inpatient Diabetes Program Recommendations:     Please give pt prescription for blood glucose meter kit (#[id number]) Advise to check blood sugars 1-2x/day at different times. Take meter to PCP visit for MD to review and make any needed adjustments.  With documented memory lapses, would not consider insulin at home. Hopefully daughter will be able to oversee medication compliance.  Will attempt to speak with daughter [name] about glucose monitoring and medication compliance.  Continue to follow.  Thank you. Lorenda Peck, RD, LDN, CDE Inpatient Diabetes Coordinator [phone]

## 2020-09-25 NOTE — Evaluation (Signed)
Physical Therapy Evaluation Patient Details Name: Erik Moss MRN: [id number] DOB: Feb 28, 1933 Today's Date: 09/25/2020   History of Present Illness  86 y.o. male with medical history hypertension, diabetes mellitus type 2 controlled with oral medication, previous CVA without any residual hemiparesis who was seen by his dermatologist on Monday last week due to generalized rash which maculopapular. Pt complaining of frequent urination and admitted for Hyperglycemia due to diabetes mellitus  Clinical Impression  Patient evaluated by Physical Therapy with no further acute PT needs identified. All education has been completed and the patient has no further questions.  Pt ambulated 800 feet in hallway without assistive device and min/guard - supervision for safety. Pt reports performing exercise bike and walking daily.  Pt denies any symptoms with ambulation and feels at baseline mobility. See below for any follow-up Physical Therapy or equipment needs. PT is signing off. Thank you for this referral.     Follow Up Recommendations No PT follow up    Equipment Recommendations  None recommended by PT    Recommendations for Other Services       Precautions / Restrictions Precautions Precautions: Fall      Mobility  Bed Mobility Overal bed mobility: Modified Independent                  Transfers Overall transfer level: Needs assistance Equipment used: None Transfers: Sit to/from Stand Sit to Stand: Supervision            Ambulation/Gait Ambulation/Gait assistance: Supervision Gait Distance (Feet): 800 Feet Assistive device: None Gait Pattern/deviations: Step-through pattern;Decreased stride length     General Gait Details: variable pace however no LOB, mildly unsteady however no physical assist required  Stairs            Wheelchair Mobility    Modified Rankin (Stroke Patients Only)       Balance Overall balance assessment: Mild deficits observed,  not formally tested (pt denies any fall hx)                                           Pertinent Vitals/Pain Pain Assessment: No/denies pain    Home Living Family/patient expects to be discharged to:: Private residence Living Arrangements: Children (daughter and granddaughter) Available Help at Discharge: Family Type of Home: House Home Access: Stairs to enter   Technical brewer of Steps: 4 Home Layout: Two level        Prior Function Level of Independence: Independent         Comments: pt reports he gets on exercise bike every morning and walks outside most days when weather agreeable     Hand Dominance        Extremity/Trunk Assessment        Lower Extremity Assessment Lower Extremity Assessment: Overall WFL for tasks assessed    Cervical / Trunk Assessment Cervical / Trunk Assessment: Other exceptions Cervical / Trunk Exceptions: forward head posture  Communication   Communication: No difficulties  Cognition Arousal/Alertness: Awake/alert Behavior During Therapy: WFL for tasks assessed/performed Overall Cognitive Status: Within Functional Limits for tasks assessed                                 General Comments: pt appropriate and pleasant during session, per chart review, pt possibly having memory issues  General Comments      Exercises     Assessment/Plan    PT Assessment Patent does not need any further PT services  PT Problem List         PT Treatment Interventions      PT Goals (Current goals can be found in the Care Plan section)  Acute Rehab PT Goals PT Goal Formulation: All assessment and education complete, DC therapy    Frequency     Barriers to discharge        Co-evaluation               AM-PAC PT "6 Clicks" Mobility  Outcome Measure Help needed turning from your back to your side while in a flat bed without using bedrails?: None Help needed moving from lying on your back  to sitting on the side of a flat bed without using bedrails?: None Help needed moving to and from a bed to a chair (including a wheelchair)?: None Help needed standing up from a chair using your arms (e.g., wheelchair or bedside chair)?: A Little Help needed to walk in hospital room?: A Little Help needed climbing 3-5 steps with a railing? : A Little 6 Click Score: 21    End of Session Equipment Utilized During Treatment: Gait belt Activity Tolerance: Patient tolerated treatment well Patient left: in bed;with call bell/phone within reach;with bed alarm set Nurse Communication: Mobility status PT Visit Diagnosis: Difficulty in walking, not elsewhere classified (R26.2)    Time: 8889-1694 PT Time Calculation (min) (ACUTE ONLY): 11 min   Charges:   PT Evaluation $PT Eval Low Complexity: 1 Low     Kati PT, DPT Acute Rehabilitation Services Pager: [phone] Office: [phone]  York Ram E 09/25/2020, 3:14 PM

## 2020-09-26 DIAGNOSIS — E1165 Type 2 diabetes mellitus with hyperglycemia: Secondary | ICD-10-CM | POA: Diagnosis not present

## 2020-09-26 LAB — URINE CULTURE: Culture: <10000 — AB

## 2020-09-26 LAB — GLUCOSE, CAPILLARY
Glucose-Capillary: 194 mg/dL — ABNORMAL HIGH (ref 70–99)
Glucose-Capillary: 256 mg/dL — ABNORMAL HIGH (ref 70–99)

## 2020-09-26 NOTE — Progress Notes (Signed)
Pt discharged to home in stable condition. Discharge instructions reviewed with patient, all question answered. IV removed. All belongings sent with patient.

## 2020-09-26 NOTE — Progress Notes (Signed)
Family and patient requested assistance with ALF placement at d/c. This RN spoke with [facility] team and found that due to PT evaluation patient did not meet criteria for SNF/ALF placement. MD made aware. Information was communicated to patient at bedside and patients daughter via phone call.   Orders placed for home heath at d/c. Family and patient were both agreeable to home health RN/aide/socal work.

## 2020-09-26 NOTE — TOC Progression Note (Signed)
Transition of Care [facility]) - Progression Note    Patient Details  Name: Erik Moss MRN: [id number] Date of Birth: 09-13-1933  [facility]) CM/SW Contact  Joaquin Courts, RN Phone Number: 09/26/2020, 2:06 PM  Clinical Narrative:    [facility] to provide HHRN/Aide/Social work.   Expected Discharge Plan: [place] Barriers to Discharge: No Barriers Identified  Expected Discharge Plan and Services Expected Discharge Plan: [place]   Discharge Planning Services: CM Consult Post Acute Care Choice: Newborn arrangements for the past 2 months: Single Family Home Expected Discharge Date: 09/26/20                         HH Arranged: RN, Nurse's Aide, Social Work [employer] Agency: [facility] Date [place]: 09/26/20 Time [place]: [employer] Representative spoke with at [place]: [place] ([place]) Interventions    Readmission Risk Interventions No flowsheet data found.

## 2020-09-29 DIAGNOSIS — Z8673 Personal history of transient ischemic attack (TIA), and cerebral infarction without residual deficits: Secondary | ICD-10-CM | POA: Diagnosis not present

## 2020-09-29 DIAGNOSIS — E1122 Type 2 diabetes mellitus with diabetic chronic kidney disease: Secondary | ICD-10-CM | POA: Diagnosis not present

## 2020-09-29 DIAGNOSIS — E1169 Type 2 diabetes mellitus with other specified complication: Secondary | ICD-10-CM | POA: Diagnosis not present

## 2020-09-29 DIAGNOSIS — I10 Essential (primary) hypertension: Secondary | ICD-10-CM | POA: Diagnosis not present

## 2020-09-29 DIAGNOSIS — N1832 Chronic kidney disease, stage 3b: Secondary | ICD-10-CM | POA: Diagnosis not present

## 2020-09-29 DIAGNOSIS — E785 Hyperlipidemia, unspecified: Secondary | ICD-10-CM | POA: Diagnosis not present

## 2020-09-29 DIAGNOSIS — R69 Illness, unspecified: Secondary | ICD-10-CM | POA: Diagnosis not present

## 2020-09-29 DIAGNOSIS — R413 Other amnesia: Secondary | ICD-10-CM | POA: Diagnosis not present

## 2020-09-29 DIAGNOSIS — Z7984 Long term (current) use of oral hypoglycemic drugs: Secondary | ICD-10-CM | POA: Diagnosis not present

## 2020-09-29 DIAGNOSIS — Z9181 History of falling: Secondary | ICD-10-CM | POA: Diagnosis not present

## 2020-09-29 DIAGNOSIS — N400 Enlarged prostate without lower urinary tract symptoms: Secondary | ICD-10-CM | POA: Diagnosis not present

## 2020-09-29 DIAGNOSIS — I129 Hypertensive chronic kidney disease with stage 1 through stage 4 chronic kidney disease, or unspecified chronic kidney disease: Secondary | ICD-10-CM | POA: Diagnosis not present

## 2020-09-29 DIAGNOSIS — Z09 Encounter for follow-up examination after completed treatment for conditions other than malignant neoplasm: Secondary | ICD-10-CM | POA: Diagnosis not present

## 2020-09-29 DIAGNOSIS — Z7982 Long term (current) use of aspirin: Secondary | ICD-10-CM | POA: Diagnosis not present

## 2020-09-29 DIAGNOSIS — N184 Chronic kidney disease, stage 4 (severe): Secondary | ICD-10-CM | POA: Diagnosis not present

## 2020-10-02 DIAGNOSIS — Z9181 History of falling: Secondary | ICD-10-CM | POA: Diagnosis not present

## 2020-10-02 DIAGNOSIS — E785 Hyperlipidemia, unspecified: Secondary | ICD-10-CM | POA: Diagnosis not present

## 2020-10-02 DIAGNOSIS — N1832 Chronic kidney disease, stage 3b: Secondary | ICD-10-CM | POA: Diagnosis not present

## 2020-10-02 DIAGNOSIS — N400 Enlarged prostate without lower urinary tract symptoms: Secondary | ICD-10-CM | POA: Diagnosis not present

## 2020-10-02 DIAGNOSIS — R69 Illness, unspecified: Secondary | ICD-10-CM | POA: Diagnosis not present

## 2020-10-02 DIAGNOSIS — Z7982 Long term (current) use of aspirin: Secondary | ICD-10-CM | POA: Diagnosis not present

## 2020-10-02 DIAGNOSIS — I129 Hypertensive chronic kidney disease with stage 1 through stage 4 chronic kidney disease, or unspecified chronic kidney disease: Secondary | ICD-10-CM | POA: Diagnosis not present

## 2020-10-02 DIAGNOSIS — Z8673 Personal history of transient ischemic attack (TIA), and cerebral infarction without residual deficits: Secondary | ICD-10-CM | POA: Diagnosis not present

## 2020-10-02 DIAGNOSIS — Z7984 Long term (current) use of oral hypoglycemic drugs: Secondary | ICD-10-CM | POA: Diagnosis not present

## 2020-10-02 DIAGNOSIS — E1122 Type 2 diabetes mellitus with diabetic chronic kidney disease: Secondary | ICD-10-CM | POA: Diagnosis not present

## 2020-10-06 DIAGNOSIS — N1832 Chronic kidney disease, stage 3b: Secondary | ICD-10-CM | POA: Diagnosis not present

## 2020-10-06 DIAGNOSIS — R69 Illness, unspecified: Secondary | ICD-10-CM | POA: Diagnosis not present

## 2020-10-06 DIAGNOSIS — Z7984 Long term (current) use of oral hypoglycemic drugs: Secondary | ICD-10-CM | POA: Diagnosis not present

## 2020-10-06 DIAGNOSIS — Z7982 Long term (current) use of aspirin: Secondary | ICD-10-CM | POA: Diagnosis not present

## 2020-10-06 DIAGNOSIS — I129 Hypertensive chronic kidney disease with stage 1 through stage 4 chronic kidney disease, or unspecified chronic kidney disease: Secondary | ICD-10-CM | POA: Diagnosis not present

## 2020-10-06 DIAGNOSIS — E1122 Type 2 diabetes mellitus with diabetic chronic kidney disease: Secondary | ICD-10-CM | POA: Diagnosis not present

## 2020-10-06 DIAGNOSIS — Z9181 History of falling: Secondary | ICD-10-CM | POA: Diagnosis not present

## 2020-10-06 DIAGNOSIS — N400 Enlarged prostate without lower urinary tract symptoms: Secondary | ICD-10-CM | POA: Diagnosis not present

## 2020-10-06 DIAGNOSIS — E785 Hyperlipidemia, unspecified: Secondary | ICD-10-CM | POA: Diagnosis not present

## 2020-10-06 DIAGNOSIS — Z8673 Personal history of transient ischemic attack (TIA), and cerebral infarction without residual deficits: Secondary | ICD-10-CM | POA: Diagnosis not present

## 2020-10-07 DIAGNOSIS — Z8673 Personal history of transient ischemic attack (TIA), and cerebral infarction without residual deficits: Secondary | ICD-10-CM | POA: Diagnosis not present

## 2020-10-07 DIAGNOSIS — Z7982 Long term (current) use of aspirin: Secondary | ICD-10-CM | POA: Diagnosis not present

## 2020-10-07 DIAGNOSIS — E1122 Type 2 diabetes mellitus with diabetic chronic kidney disease: Secondary | ICD-10-CM | POA: Diagnosis not present

## 2020-10-07 DIAGNOSIS — N400 Enlarged prostate without lower urinary tract symptoms: Secondary | ICD-10-CM | POA: Diagnosis not present

## 2020-10-07 DIAGNOSIS — Z7984 Long term (current) use of oral hypoglycemic drugs: Secondary | ICD-10-CM | POA: Diagnosis not present

## 2020-10-07 DIAGNOSIS — R69 Illness, unspecified: Secondary | ICD-10-CM | POA: Diagnosis not present

## 2020-10-07 DIAGNOSIS — Z9181 History of falling: Secondary | ICD-10-CM | POA: Diagnosis not present

## 2020-10-07 DIAGNOSIS — E785 Hyperlipidemia, unspecified: Secondary | ICD-10-CM | POA: Diagnosis not present

## 2020-10-07 DIAGNOSIS — I129 Hypertensive chronic kidney disease with stage 1 through stage 4 chronic kidney disease, or unspecified chronic kidney disease: Secondary | ICD-10-CM | POA: Diagnosis not present

## 2020-10-07 DIAGNOSIS — N1832 Chronic kidney disease, stage 3b: Secondary | ICD-10-CM | POA: Diagnosis not present

## 2020-10-09 DIAGNOSIS — N1832 Chronic kidney disease, stage 3b: Secondary | ICD-10-CM | POA: Diagnosis not present

## 2020-10-09 DIAGNOSIS — Z7982 Long term (current) use of aspirin: Secondary | ICD-10-CM | POA: Diagnosis not present

## 2020-10-09 DIAGNOSIS — R69 Illness, unspecified: Secondary | ICD-10-CM | POA: Diagnosis not present

## 2020-10-09 DIAGNOSIS — N400 Enlarged prostate without lower urinary tract symptoms: Secondary | ICD-10-CM | POA: Diagnosis not present

## 2020-10-09 DIAGNOSIS — Z8673 Personal history of transient ischemic attack (TIA), and cerebral infarction without residual deficits: Secondary | ICD-10-CM | POA: Diagnosis not present

## 2020-10-09 DIAGNOSIS — E1122 Type 2 diabetes mellitus with diabetic chronic kidney disease: Secondary | ICD-10-CM | POA: Diagnosis not present

## 2020-10-09 DIAGNOSIS — Z9181 History of falling: Secondary | ICD-10-CM | POA: Diagnosis not present

## 2020-10-09 DIAGNOSIS — E785 Hyperlipidemia, unspecified: Secondary | ICD-10-CM | POA: Diagnosis not present

## 2020-10-09 DIAGNOSIS — I129 Hypertensive chronic kidney disease with stage 1 through stage 4 chronic kidney disease, or unspecified chronic kidney disease: Secondary | ICD-10-CM | POA: Diagnosis not present

## 2020-10-09 DIAGNOSIS — Z7984 Long term (current) use of oral hypoglycemic drugs: Secondary | ICD-10-CM | POA: Diagnosis not present

## 2020-10-12 DIAGNOSIS — N1832 Chronic kidney disease, stage 3b: Secondary | ICD-10-CM | POA: Diagnosis not present

## 2020-10-12 DIAGNOSIS — Z9181 History of falling: Secondary | ICD-10-CM | POA: Diagnosis not present

## 2020-10-12 DIAGNOSIS — N400 Enlarged prostate without lower urinary tract symptoms: Secondary | ICD-10-CM | POA: Diagnosis not present

## 2020-10-12 DIAGNOSIS — R69 Illness, unspecified: Secondary | ICD-10-CM | POA: Diagnosis not present

## 2020-10-12 DIAGNOSIS — Z7982 Long term (current) use of aspirin: Secondary | ICD-10-CM | POA: Diagnosis not present

## 2020-10-12 DIAGNOSIS — Z8673 Personal history of transient ischemic attack (TIA), and cerebral infarction without residual deficits: Secondary | ICD-10-CM | POA: Diagnosis not present

## 2020-10-12 DIAGNOSIS — E785 Hyperlipidemia, unspecified: Secondary | ICD-10-CM | POA: Diagnosis not present

## 2020-10-12 DIAGNOSIS — I129 Hypertensive chronic kidney disease with stage 1 through stage 4 chronic kidney disease, or unspecified chronic kidney disease: Secondary | ICD-10-CM | POA: Diagnosis not present

## 2020-10-12 DIAGNOSIS — E1122 Type 2 diabetes mellitus with diabetic chronic kidney disease: Secondary | ICD-10-CM | POA: Diagnosis not present

## 2020-10-12 DIAGNOSIS — Z7984 Long term (current) use of oral hypoglycemic drugs: Secondary | ICD-10-CM | POA: Diagnosis not present

## 2020-10-14 DIAGNOSIS — Z8673 Personal history of transient ischemic attack (TIA), and cerebral infarction without residual deficits: Secondary | ICD-10-CM | POA: Diagnosis not present

## 2020-10-14 DIAGNOSIS — N1832 Chronic kidney disease, stage 3b: Secondary | ICD-10-CM | POA: Diagnosis not present

## 2020-10-14 DIAGNOSIS — I129 Hypertensive chronic kidney disease with stage 1 through stage 4 chronic kidney disease, or unspecified chronic kidney disease: Secondary | ICD-10-CM | POA: Diagnosis not present

## 2020-10-14 DIAGNOSIS — Z7982 Long term (current) use of aspirin: Secondary | ICD-10-CM | POA: Diagnosis not present

## 2020-10-14 DIAGNOSIS — Z9181 History of falling: Secondary | ICD-10-CM | POA: Diagnosis not present

## 2020-10-14 DIAGNOSIS — R69 Illness, unspecified: Secondary | ICD-10-CM | POA: Diagnosis not present

## 2020-10-14 DIAGNOSIS — E1122 Type 2 diabetes mellitus with diabetic chronic kidney disease: Secondary | ICD-10-CM | POA: Diagnosis not present

## 2020-10-14 DIAGNOSIS — N400 Enlarged prostate without lower urinary tract symptoms: Secondary | ICD-10-CM | POA: Diagnosis not present

## 2020-10-14 DIAGNOSIS — Z7984 Long term (current) use of oral hypoglycemic drugs: Secondary | ICD-10-CM | POA: Diagnosis not present

## 2020-10-14 DIAGNOSIS — E785 Hyperlipidemia, unspecified: Secondary | ICD-10-CM | POA: Diagnosis not present

## 2020-10-21 DIAGNOSIS — Z7982 Long term (current) use of aspirin: Secondary | ICD-10-CM | POA: Diagnosis not present

## 2020-10-21 DIAGNOSIS — E1122 Type 2 diabetes mellitus with diabetic chronic kidney disease: Secondary | ICD-10-CM | POA: Diagnosis not present

## 2020-10-21 DIAGNOSIS — R69 Illness, unspecified: Secondary | ICD-10-CM | POA: Diagnosis not present

## 2020-10-21 DIAGNOSIS — N400 Enlarged prostate without lower urinary tract symptoms: Secondary | ICD-10-CM | POA: Diagnosis not present

## 2020-10-21 DIAGNOSIS — Z8673 Personal history of transient ischemic attack (TIA), and cerebral infarction without residual deficits: Secondary | ICD-10-CM | POA: Diagnosis not present

## 2020-10-21 DIAGNOSIS — I129 Hypertensive chronic kidney disease with stage 1 through stage 4 chronic kidney disease, or unspecified chronic kidney disease: Secondary | ICD-10-CM | POA: Diagnosis not present

## 2020-10-21 DIAGNOSIS — Z9181 History of falling: Secondary | ICD-10-CM | POA: Diagnosis not present

## 2020-10-21 DIAGNOSIS — Z7984 Long term (current) use of oral hypoglycemic drugs: Secondary | ICD-10-CM | POA: Diagnosis not present

## 2020-10-21 DIAGNOSIS — N1832 Chronic kidney disease, stage 3b: Secondary | ICD-10-CM | POA: Diagnosis not present

## 2020-10-21 DIAGNOSIS — E785 Hyperlipidemia, unspecified: Secondary | ICD-10-CM | POA: Diagnosis not present

## 2020-10-27 DIAGNOSIS — N400 Enlarged prostate without lower urinary tract symptoms: Secondary | ICD-10-CM | POA: Diagnosis not present

## 2020-10-27 DIAGNOSIS — E785 Hyperlipidemia, unspecified: Secondary | ICD-10-CM | POA: Diagnosis not present

## 2020-10-27 DIAGNOSIS — R69 Illness, unspecified: Secondary | ICD-10-CM | POA: Diagnosis not present

## 2020-10-27 DIAGNOSIS — Z7982 Long term (current) use of aspirin: Secondary | ICD-10-CM | POA: Diagnosis not present

## 2020-10-27 DIAGNOSIS — E1122 Type 2 diabetes mellitus with diabetic chronic kidney disease: Secondary | ICD-10-CM | POA: Diagnosis not present

## 2020-10-27 DIAGNOSIS — I129 Hypertensive chronic kidney disease with stage 1 through stage 4 chronic kidney disease, or unspecified chronic kidney disease: Secondary | ICD-10-CM | POA: Diagnosis not present

## 2020-10-27 DIAGNOSIS — N1832 Chronic kidney disease, stage 3b: Secondary | ICD-10-CM | POA: Diagnosis not present

## 2020-10-27 DIAGNOSIS — Z9181 History of falling: Secondary | ICD-10-CM | POA: Diagnosis not present

## 2020-10-27 DIAGNOSIS — Z8673 Personal history of transient ischemic attack (TIA), and cerebral infarction without residual deficits: Secondary | ICD-10-CM | POA: Diagnosis not present

## 2020-10-27 DIAGNOSIS — Z7984 Long term (current) use of oral hypoglycemic drugs: Secondary | ICD-10-CM | POA: Diagnosis not present

## 2020-10-28 DIAGNOSIS — R69 Illness, unspecified: Secondary | ICD-10-CM | POA: Diagnosis not present

## 2020-10-28 DIAGNOSIS — Z7982 Long term (current) use of aspirin: Secondary | ICD-10-CM | POA: Diagnosis not present

## 2020-10-28 DIAGNOSIS — Z8673 Personal history of transient ischemic attack (TIA), and cerebral infarction without residual deficits: Secondary | ICD-10-CM | POA: Diagnosis not present

## 2020-10-28 DIAGNOSIS — N1832 Chronic kidney disease, stage 3b: Secondary | ICD-10-CM | POA: Diagnosis not present

## 2020-10-28 DIAGNOSIS — I129 Hypertensive chronic kidney disease with stage 1 through stage 4 chronic kidney disease, or unspecified chronic kidney disease: Secondary | ICD-10-CM | POA: Diagnosis not present

## 2020-10-28 DIAGNOSIS — E785 Hyperlipidemia, unspecified: Secondary | ICD-10-CM | POA: Diagnosis not present

## 2020-10-28 DIAGNOSIS — N400 Enlarged prostate without lower urinary tract symptoms: Secondary | ICD-10-CM | POA: Diagnosis not present

## 2020-10-28 DIAGNOSIS — Z7984 Long term (current) use of oral hypoglycemic drugs: Secondary | ICD-10-CM | POA: Diagnosis not present

## 2020-10-28 DIAGNOSIS — Z9181 History of falling: Secondary | ICD-10-CM | POA: Diagnosis not present

## 2020-10-28 DIAGNOSIS — E1122 Type 2 diabetes mellitus with diabetic chronic kidney disease: Secondary | ICD-10-CM | POA: Diagnosis not present

## 2020-11-03 DIAGNOSIS — E1122 Type 2 diabetes mellitus with diabetic chronic kidney disease: Secondary | ICD-10-CM | POA: Diagnosis not present

## 2020-11-03 DIAGNOSIS — N400 Enlarged prostate without lower urinary tract symptoms: Secondary | ICD-10-CM | POA: Diagnosis not present

## 2020-11-03 DIAGNOSIS — N1832 Chronic kidney disease, stage 3b: Secondary | ICD-10-CM | POA: Diagnosis not present

## 2020-11-03 DIAGNOSIS — R69 Illness, unspecified: Secondary | ICD-10-CM | POA: Diagnosis not present

## 2020-11-03 DIAGNOSIS — Z9181 History of falling: Secondary | ICD-10-CM | POA: Diagnosis not present

## 2020-11-03 DIAGNOSIS — I129 Hypertensive chronic kidney disease with stage 1 through stage 4 chronic kidney disease, or unspecified chronic kidney disease: Secondary | ICD-10-CM | POA: Diagnosis not present

## 2020-11-03 DIAGNOSIS — Z7982 Long term (current) use of aspirin: Secondary | ICD-10-CM | POA: Diagnosis not present

## 2020-11-03 DIAGNOSIS — E785 Hyperlipidemia, unspecified: Secondary | ICD-10-CM | POA: Diagnosis not present

## 2020-11-03 DIAGNOSIS — Z8673 Personal history of transient ischemic attack (TIA), and cerebral infarction without residual deficits: Secondary | ICD-10-CM | POA: Diagnosis not present

## 2020-11-03 DIAGNOSIS — Z7984 Long term (current) use of oral hypoglycemic drugs: Secondary | ICD-10-CM | POA: Diagnosis not present

## 2020-11-20 DIAGNOSIS — I129 Hypertensive chronic kidney disease with stage 1 through stage 4 chronic kidney disease, or unspecified chronic kidney disease: Secondary | ICD-10-CM | POA: Diagnosis not present

## 2020-11-20 DIAGNOSIS — N400 Enlarged prostate without lower urinary tract symptoms: Secondary | ICD-10-CM | POA: Diagnosis not present

## 2020-11-20 DIAGNOSIS — Z9181 History of falling: Secondary | ICD-10-CM | POA: Diagnosis not present

## 2020-11-20 DIAGNOSIS — E785 Hyperlipidemia, unspecified: Secondary | ICD-10-CM | POA: Diagnosis not present

## 2020-11-20 DIAGNOSIS — N1832 Chronic kidney disease, stage 3b: Secondary | ICD-10-CM | POA: Diagnosis not present

## 2020-11-20 DIAGNOSIS — E1122 Type 2 diabetes mellitus with diabetic chronic kidney disease: Secondary | ICD-10-CM | POA: Diagnosis not present

## 2020-11-20 DIAGNOSIS — R69 Illness, unspecified: Secondary | ICD-10-CM | POA: Diagnosis not present

## 2020-11-20 DIAGNOSIS — Z8673 Personal history of transient ischemic attack (TIA), and cerebral infarction without residual deficits: Secondary | ICD-10-CM | POA: Diagnosis not present

## 2020-11-20 DIAGNOSIS — Z7984 Long term (current) use of oral hypoglycemic drugs: Secondary | ICD-10-CM | POA: Diagnosis not present

## 2020-11-20 DIAGNOSIS — Z7982 Long term (current) use of aspirin: Secondary | ICD-10-CM | POA: Diagnosis not present

## 2020-12-11 DIAGNOSIS — H2513 Age-related nuclear cataract, bilateral: Secondary | ICD-10-CM | POA: Diagnosis not present

## 2020-12-11 DIAGNOSIS — H40013 Open angle with borderline findings, low risk, bilateral: Secondary | ICD-10-CM | POA: Diagnosis not present

## 2020-12-11 DIAGNOSIS — E119 Type 2 diabetes mellitus without complications: Secondary | ICD-10-CM | POA: Diagnosis not present

## 2020-12-11 DIAGNOSIS — H25013 Cortical age-related cataract, bilateral: Secondary | ICD-10-CM | POA: Diagnosis not present

## 2021-01-12 DIAGNOSIS — H2512 Age-related nuclear cataract, left eye: Secondary | ICD-10-CM | POA: Diagnosis not present

## 2021-01-12 DIAGNOSIS — H2513 Age-related nuclear cataract, bilateral: Secondary | ICD-10-CM | POA: Diagnosis not present

## 2021-01-12 DIAGNOSIS — H524 Presbyopia: Secondary | ICD-10-CM | POA: Diagnosis not present

## 2021-01-12 DIAGNOSIS — H25013 Cortical age-related cataract, bilateral: Secondary | ICD-10-CM | POA: Diagnosis not present

## 2021-01-12 DIAGNOSIS — H25041 Posterior subcapsular polar age-related cataract, right eye: Secondary | ICD-10-CM | POA: Diagnosis not present

## 2021-01-12 DIAGNOSIS — H25032 Anterior subcapsular polar age-related cataract, left eye: Secondary | ICD-10-CM | POA: Diagnosis not present

## 2021-01-25 DIAGNOSIS — N1832 Chronic kidney disease, stage 3b: Secondary | ICD-10-CM | POA: Diagnosis not present

## 2021-01-26 ENCOUNTER — Emergency Department (HOSPITAL_COMMUNITY)
Admission: EM | Admit: 2021-01-26 | Discharge: 2021-01-26 | Disposition: A | Discharge status: Home / Self Care | Payer: Medicare HMO | Attending: Emergency Medicine | Admitting: Emergency Medicine

## 2021-01-26 ENCOUNTER — Other Ambulatory Visit: Payer: Self-pay

## 2021-01-26 ENCOUNTER — Emergency Department (HOSPITAL_COMMUNITY): Payer: Medicare HMO

## 2021-01-26 ENCOUNTER — Encounter (HOSPITAL_COMMUNITY): Payer: Self-pay

## 2021-01-26 DIAGNOSIS — R4701 Aphasia: Secondary | ICD-10-CM | POA: Insufficient documentation

## 2021-01-26 DIAGNOSIS — Z7984 Long term (current) use of oral hypoglycemic drugs: Secondary | ICD-10-CM | POA: Diagnosis not present

## 2021-01-26 DIAGNOSIS — I639 Cerebral infarction, unspecified: Secondary | ICD-10-CM | POA: Diagnosis not present

## 2021-01-26 DIAGNOSIS — Z7902 Long term (current) use of antithrombotics/antiplatelets: Secondary | ICD-10-CM | POA: Insufficient documentation

## 2021-01-26 DIAGNOSIS — E119 Type 2 diabetes mellitus without complications: Secondary | ICD-10-CM | POA: Diagnosis not present

## 2021-01-26 DIAGNOSIS — G459 Transient cerebral ischemic attack, unspecified: Secondary | ICD-10-CM

## 2021-01-26 DIAGNOSIS — J3489 Other specified disorders of nose and nasal sinuses: Secondary | ICD-10-CM | POA: Diagnosis not present

## 2021-01-26 DIAGNOSIS — I10 Essential (primary) hypertension: Secondary | ICD-10-CM | POA: Diagnosis not present

## 2021-01-26 DIAGNOSIS — Z79899 Other long term (current) drug therapy: Secondary | ICD-10-CM | POA: Insufficient documentation

## 2021-01-26 DIAGNOSIS — R531 Weakness: Secondary | ICD-10-CM | POA: Diagnosis not present

## 2021-01-26 DIAGNOSIS — I6782 Cerebral ischemia: Secondary | ICD-10-CM | POA: Diagnosis not present

## 2021-01-26 DIAGNOSIS — G9389 Other specified disorders of brain: Secondary | ICD-10-CM | POA: Diagnosis not present

## 2021-01-26 IMAGING — MR MR HEAD W/O CM
10 series · 48 of 48 positions shown · non-contrast
Comparison: [DATE]

CLINICAL DATA: Possible TIA, suspected aphasia and gait instability

EXAM:
MRI HEAD WITHOUT CONTRAST
TECHNIQUE: Multiplanar, multiecho pulse sequences of the brain and surrounding
structures were obtained without intravenous contrast.

[Series 5: DWI · axial · 3.0mm · 1.36mm/px · z∈[-57,+84]mm · 10 of 96 slices shown (1 of 2)]
[im 1/96]
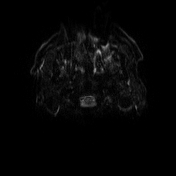
[im 11/96]
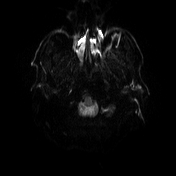
[im 22/96]
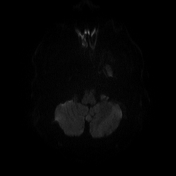
[im 32/96]
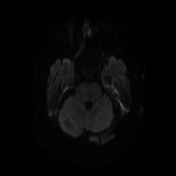
[im 43/96]
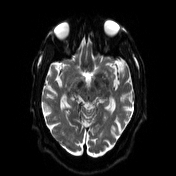
[im 53/96]
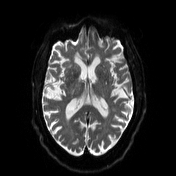
[im 64/96]
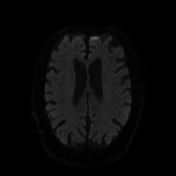
[im 74/96]
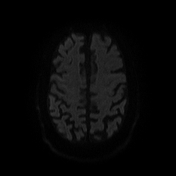
[im 85/96]
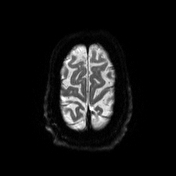
[im 96/96]
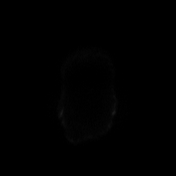

[Series 6: DWI · axial · 3.0mm · 1.36mm/px · z∈[-57,+84]mm · 5 of 48 slices shown (2 of 2)]
[im 1/48]
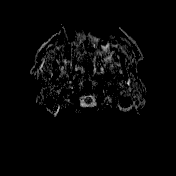
[im 12/48]
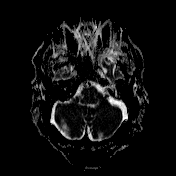
[im 24/48]
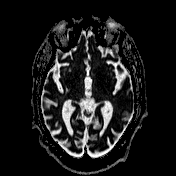
[im 36/48]
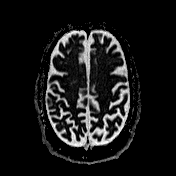
[im 48/48]
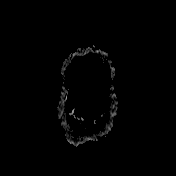

[Series 7: T1 · sagittal · 5.0mm · 0.75mm/px · 3 of 24 slices shown (1 of 2)]
[im 1/24]
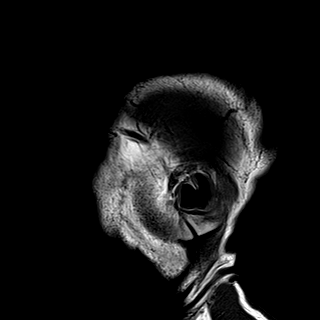
[im 12/24]
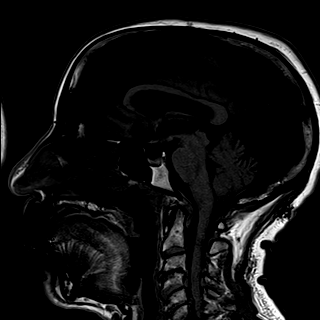
[im 24/24]
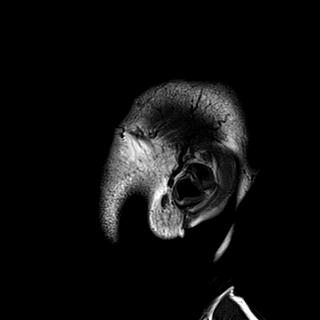

[Series 8: T2 · axial · 5.0mm · 0.62mm/px · z∈[-72,+90]mm · 3 of 26 slices shown (1 of 2)]
[im 1/26]
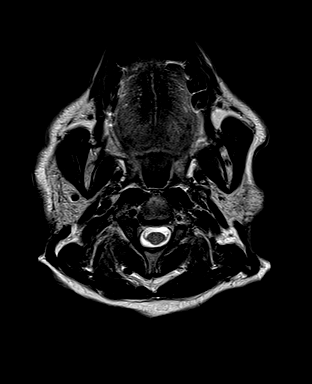
[im 13/26]
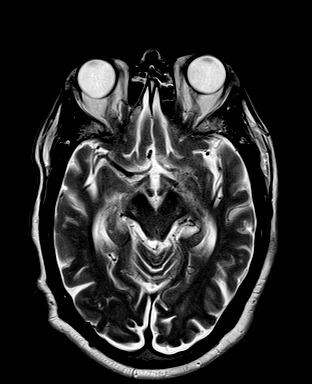
[im 26/26]
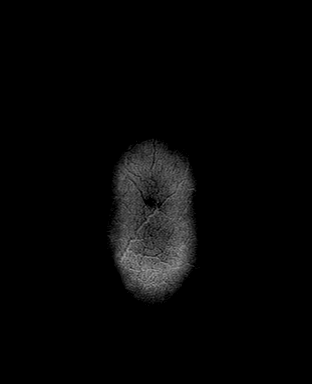

[Series 9: GRE · axial · 3.0mm · 0.47mm/px · z∈[-47,+87]mm · 5 of 46 slices shown]
[im 1/46]
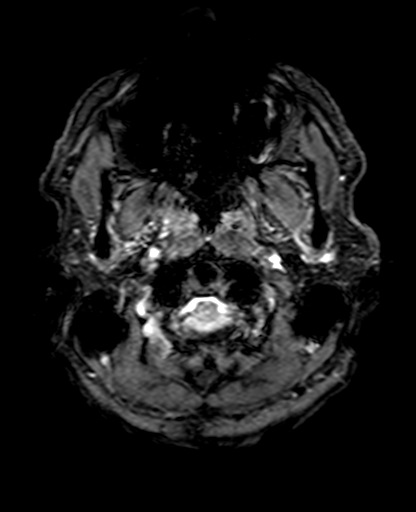
[im 12/46]
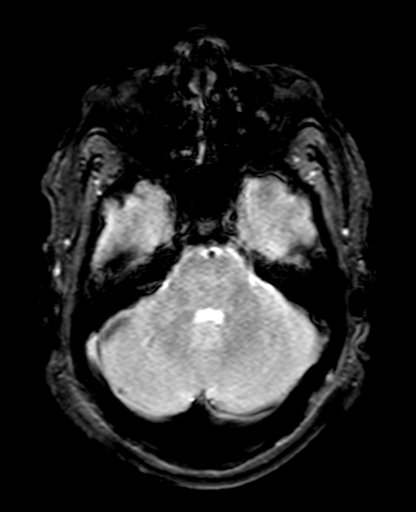
[im 23/46]
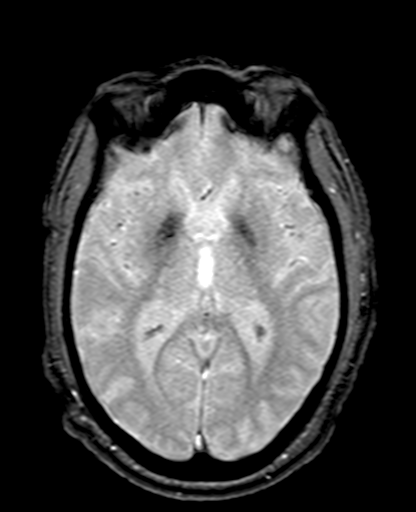
[im 34/46]
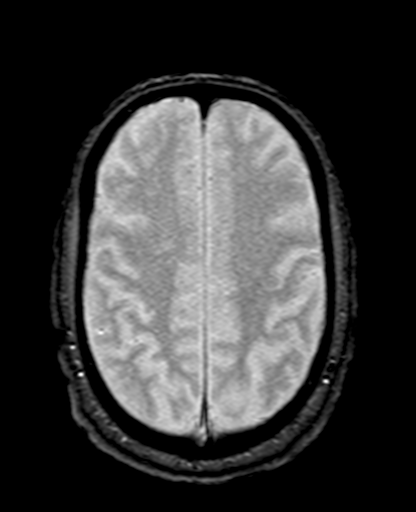
[im 46/46]
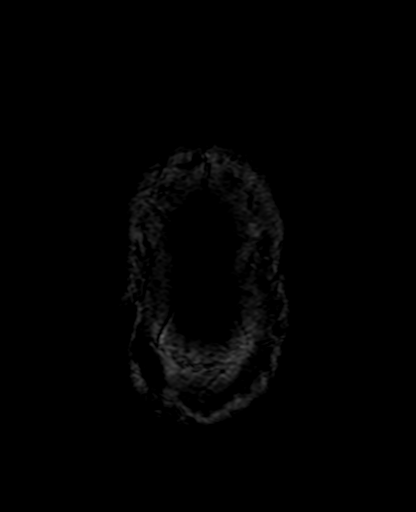

[Series 10: FLAIR · axial · 3.0mm · 0.94mm/px · z∈[-48,+92]mm · 5 of 48 slices shown]
[im 1/48]
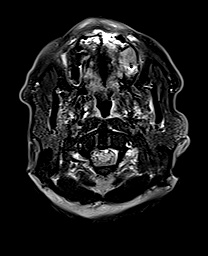
[im 12/48]
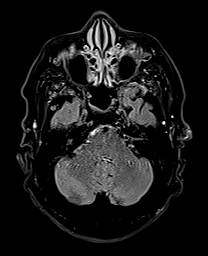
[im 24/48]
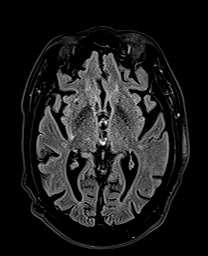
[im 36/48]
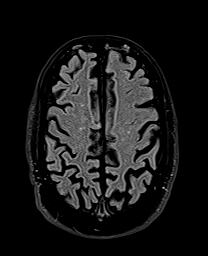
[im 48/48]
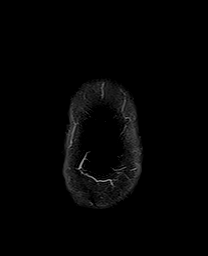

[Series 11: T1 · axial · 3.0mm · 0.45mm/px · z∈[-49,+91]mm · 5 of 48 slices shown (2 of 2)]
[im 1/48]
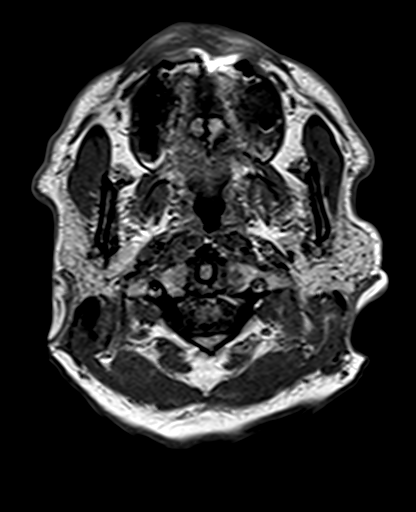
[im 12/48]
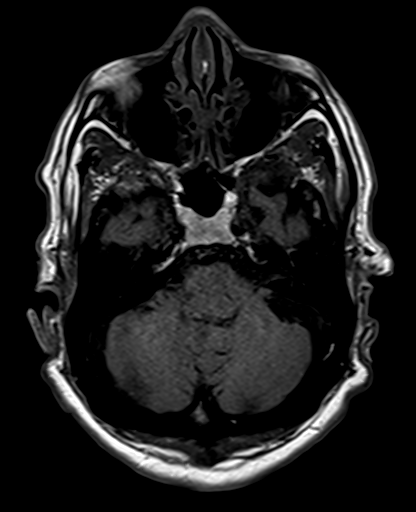
[im 24/48]
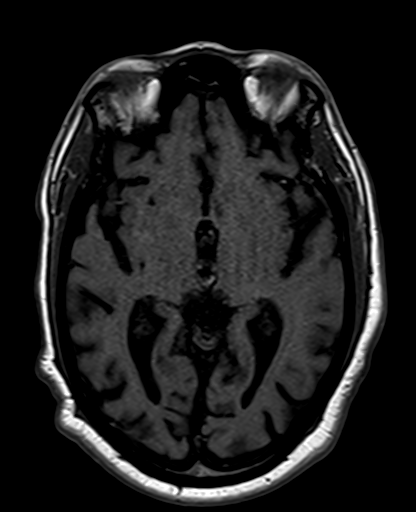
[im 36/48]
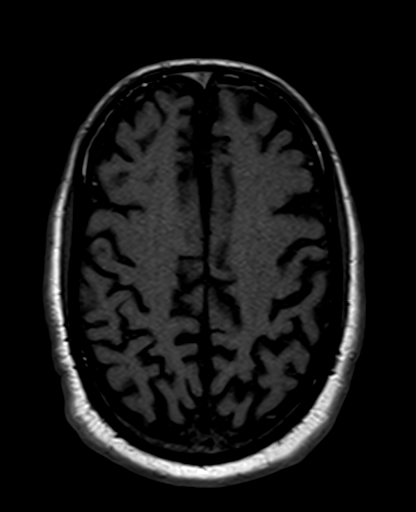
[im 48/48]
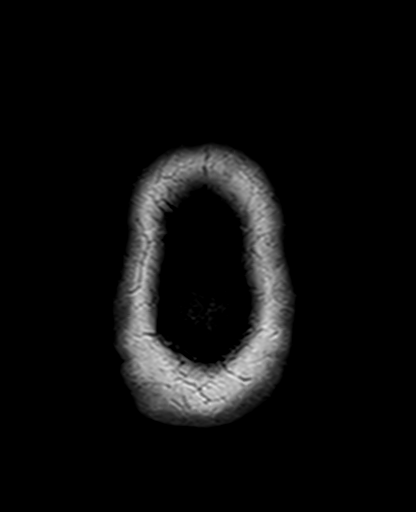

[Series 12: cor dwi_tracew · coronal · 5.0mm · 1.53mm/px · 6 of 52 slices shown]
[im 1/52]
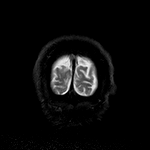
[im 11/52]
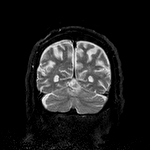
[im 21/52]
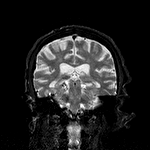
[im 31/52]
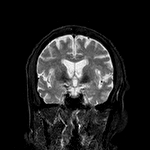
[im 41/52]
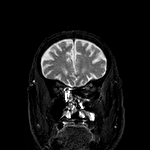
[im 52/52]
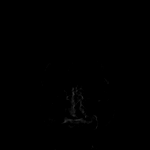

[Series 13: cor dwi_adc · coronal · 5.0mm · 1.53mm/px · 3 of 26 slices shown]
[im 1/26]
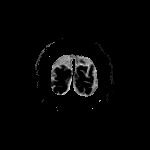
[im 13/26]
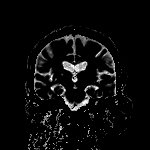
[im 26/26]
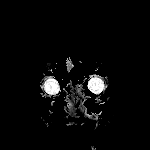

[Series 14: T2 · coronal · 5.0mm · 0.69mm/px · 3 of 32 slices shown (2 of 2)]
[im 1/32]
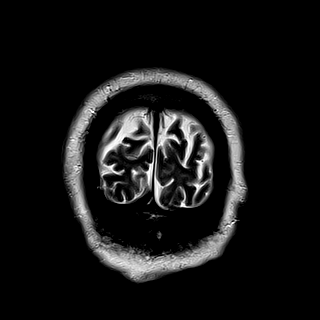
[im 16/32]
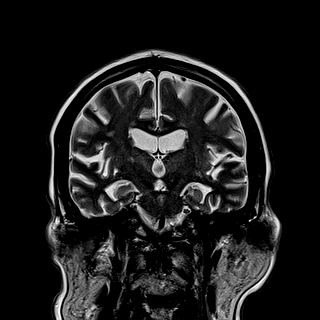
[im 32/32]
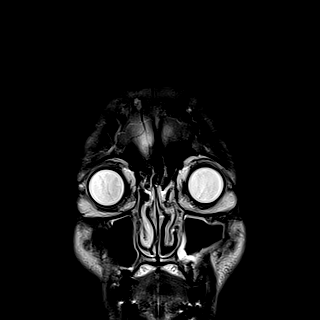

[48 of 48 positions shown; findings below may reference images not displayed]

FINDINGS: Brain: There is no acute infarction or intracranial hemorrhage.
There is no intracranial mass, mass effect, or edema. There is no
hydrocephalus or extra-axial fluid collection. Patchy foci of T2
hyperintensity in the supratentorial and pontine white matter are
nonspecific but may reflect mild to moderate chronic microvascular
ischemic changes. Prominence of the ventricles and sulci reflects
generalized parenchymal volume loss. Chronic small vessel infarct of
the lateral right thalamus. Chronic small vessel infarct of the
right subinsular white matter. Appearance is similar to the prior
study

Vascular: Major vessel flow voids at the skull base are preserved.

Skull and upper cervical spine: Normal marrow signal is preserved.

Sinuses/Orbits: Ethmoid and maxillary sinus mucosal thickening.
Orbits are unremarkable.

Other: Sella is unremarkable.  Mastoid air cells are clear.
IMPRESSION: No evidence of recent infarction, hemorrhage, or mass. Similar
chronic microvascular ischemic changes and volume loss.

## 2021-01-26 NOTE — ED Notes (Signed)
Patient transported to MRI 

## 2021-01-26 NOTE — ED Triage Notes (Signed)
Pt brought in by daughter for slurred speech & confusion (hx of dementia) starting 1 hr ago- hx of previous stroke. No facial droop noted. Daughter unsure if pt is on anticoags. Pt A&O X4

## 2021-01-26 NOTE — ED Notes (Signed)
Patient daughter [name]) call to pick patient up and she will be here in 20 min.

## 2021-01-26 NOTE — Discharge Instructions (Signed)
Mr. Fehr, please continue taking your plavix and follow up with [name] in 1 week (someone will call you).

## 2021-01-26 NOTE — ED Provider Notes (Signed)
[facility] DEPT Provider Note   CSN: [id number] Arrival date & time: 01/26/21  0856     History Chief Complaint  Patient presents with  . [place] is a 87 y.o. male with history of right lacunar stroke in 09/2019, now on plavix monotherapy. He was brought in by daughter for slurred speech that began around 7/7:30AM today. She also noticed that he was having some difficulty walking to the door. Daughter does not remember if patient had any facial drooping or weakness in any extremity. Last known normal prior to symptoms was yesterday night. Patient reports feeling fine this AM and was able to perform his normal ADLs (make breakfast, shower) without any issues.   HPI     Past Medical History:  Diagnosis Date  . BPH (benign prostatic hyperplasia) 10/08/2019  . Diabetes mellitus without complication ([place])   . Hyperlipidemia 10/08/2019  . Hypertension 10/08/2019  . Renal disease   . Stroke ([place])   . Type 2 diabetes mellitus ([place]) 10/08/2019    Patient Active Problem List   Diagnosis Date Noted  . Hyperglycemia due to diabetes mellitus ([place]) 09/24/2020  . Acute CVA (cerebrovascular accident) ([place]) 10/08/2019  . Type 2 diabetes mellitus ([place]) 10/08/2019  . Hyperlipidemia 10/08/2019  . BPH (benign prostatic hyperplasia) 10/08/2019  . Hypertension 10/08/2019  . ARF (acute renal failure) ([place]) 05/08/2019    Past Surgical History:  Procedure Laterality Date  . BUBBLE STUDY  10/10/2019   Procedure: BUBBLE STUDY;  Surgeon: Geralynn Rile, MD;  Location: [facility];  Service: Cardiology;;  . TEE WITHOUT CARDIOVERSION N/A 10/10/2019   Procedure: TRANSESOPHAGEAL ECHOCARDIOGRAM (TEE);  Surgeon: Geralynn Rile, MD;  Location: [facility];  Service: Cardiology;  Laterality: N/A;       No family history on file.  Social History   Tobacco Use  . Smoking status: Never Smoker  . Smokeless tobacco: Never Used   Substance Use Topics  . Alcohol use: Never  . Drug use: Never    Home Medications Prior to Admission medications   Medication Sig Start Date End Date Taking? Authorizing Provider  amLODipine (NORVASC) 5 MG tablet Take 5 mg by mouth daily.    [provider]  atorvastatin (LIPITOR) 40 MG tablet Take 1 tablet (40 mg total) by mouth daily. 10/10/19 09/24/20  [place] [name], DO  blood glucose meter kit and supplies Dispense based on patient and insurance preference. Use up to four times daily as directed. (FOR ICD-10 E10.9, E11.9). 09/25/20   [name], MD  clopidogrel (PLAVIX) 75 MG tablet Take 75 mg by mouth daily. 11/05/19   [provider]  ferrous gluconate (FERGON) 324 MG tablet Take 648 mg by mouth daily.     [provider]  glimepiride (AMARYL) 2 MG tablet Take 2 mg by mouth in the morning and at bedtime.    [provider]  Magnesium Hydroxide (DULCOLAX PO) Take 1 tablet by mouth in the morning and at bedtime.    [provider]  mirabegron ER (MYRBETRIQ) 50 MG TB24 tablet Take 50 mg by mouth daily.    [provider]  Multiple Vitamin (MULTIVITAMIN WITH MINERALS) TABS tablet Take 1 tablet by mouth daily.    [provider]  polyethylene glycol (MIRALAX / GLYCOLAX) 17 g packet Take 17 g by mouth 2 (two) times daily.    [provider]  Protein POWD Take 1 each by mouth daily.    [provider]  sitaGLIPtin (JANUVIA) 25 MG tablet Take 25 mg by mouth daily.    [provider]  sodium bicarbonate 650 MG tablet Take 1,300 mg by mouth daily.     [provider]  tamsulosin (FLOMAX) 0.4 MG CAPS capsule Take 0.4 mg by mouth at bedtime.     [provider]  triamcinolone (KENALOG) 0.1 % Apply 1 application topically daily as needed (itching).  09/22/20   [provider]  triamcinolone ointment (KENALOG) 0.1 % Apply 1 application topically daily as needed (itching on lower  legs.).     [provider]    Allergies    Patient has no known allergies.  Review of Systems   Review of Systems  Constitutional: Negative for activity change, appetite change, chills, diaphoresis, fatigue and fever.  HENT: Negative for trouble swallowing.   Eyes: Negative for visual disturbance.  Respiratory: Negative for cough and shortness of breath.   Cardiovascular: Negative for chest pain and palpitations.  Gastrointestinal: Negative for abdominal pain, nausea and vomiting.  Musculoskeletal: Negative for gait problem, neck pain and neck stiffness.  Skin: Negative for pallor.  Neurological: Negative for dizziness, tremors, seizures, syncope, facial asymmetry, speech difficulty, weakness, light-headedness, numbness and headaches.  Psychiatric/Behavioral: Negative for agitation, behavioral problems and confusion. The patient is not nervous/anxious.     Physical Exam Updated Vital Signs BP (!) 150/77 (BP Location: Left Arm)   Pulse 92   Temp 97.8 F (36.6 C) (Oral)   Resp 20   Ht '5\' 6"'  (1.676 m)   Wt 71 kg   SpO2 99%   BMI 25.26 kg/m   Physical Exam Constitutional:      Appearance: Normal appearance. He is not ill-appearing.  HENT:     Head: Normocephalic and atraumatic.     Nose: Nose normal. No congestion.     Mouth/Throat:     Mouth: Mucous membranes are dry.     Pharynx: Oropharynx is clear. No oropharyngeal exudate.  Eyes:     Extraocular Movements: Extraocular movements intact.     Conjunctiva/sclera: Conjunctivae normal.     Pupils: Pupils are equal, round, and reactive to light.  Cardiovascular:     Rate and Rhythm: Normal rate and regular rhythm.     Pulses: Normal pulses.     Heart sounds: Normal heart sounds. No murmur heard. No friction rub. No gallop.   Pulmonary:     Effort: Pulmonary effort is normal.     Breath sounds: Normal breath sounds. No wheezing, rhonchi or rales.  Abdominal:     General: Bowel sounds are normal.      Palpations: Abdomen is soft.     Tenderness: There is no abdominal tenderness.  Musculoskeletal:        General: Normal range of motion.     Cervical back: Normal range of motion.  Skin:    General: Skin is warm and dry.  Neurological:     General: No focal deficit present.     Mental Status: He is alert and oriented to person, place, and time.     Cranial Nerves: No cranial nerve deficit.     Sensory: No sensory deficit.     Motor: No weakness.     Coordination: Coordination normal.     Gait: Gait normal.  Psychiatric:        Mood and Affect: Mood normal.        Behavior: Behavior normal.     ED Results / Procedures / Treatments  Labs (all labs ordered are listed, but only abnormal results are displayed) Labs Reviewed - No data to display  EKG None  Radiology No results found.   Medications Ordered in ED Medications - No data to display  ED Course  I have reviewed the triage vital signs and the nursing notes.  Pertinent labs & imaging results that were available during my care of the patient were reviewed by me and considered in my medical decision making (see chart for details).    MDM Rules/Calculators/A&P                          Patient with history of prior right lacunar stroke on plavix monotherapy and statin therapy for secondary prevention. His symptoms have completely resolved on my exam with no aphasia, facial drooping, muscle weakness, gait instability. Risk for repeat stroke or TIA is decreased with being on statin and plavix therapy, but not completely eliminated.  Obtained MR brain wo contrast for further evaluation, which was similar to prior with no evidence of recent infarction, hemorrhage, or mass.  Given concern for aphasia and gait instability, it is possible that patient had a TIA.  Imaging is not suggestive of an acute stroke.  There is no indication for hospital admission at this time as patient's symptoms have completely resolved with no deficits  noted on exam. Will discharge with plan to follow-up with [facility] neurology in 1 week.  Final Clinical Impression(s) / ED Diagnoses Final diagnoses:  None    Rx / DC Orders ED Discharge Orders    None       Carmin Muskrat, MD 01/29/21 2345

## 2021-01-28 ENCOUNTER — Institutional Professional Consult (permissible substitution): Payer: Medicare HMO | Admitting: Neurology

## 2021-01-31 DIAGNOSIS — E1151 Type 2 diabetes mellitus with diabetic peripheral angiopathy without gangrene: Secondary | ICD-10-CM | POA: Diagnosis not present

## 2021-01-31 DIAGNOSIS — H547 Unspecified visual loss: Secondary | ICD-10-CM | POA: Diagnosis not present

## 2021-01-31 DIAGNOSIS — N400 Enlarged prostate without lower urinary tract symptoms: Secondary | ICD-10-CM | POA: Diagnosis not present

## 2021-01-31 DIAGNOSIS — E11620 Type 2 diabetes mellitus with diabetic dermatitis: Secondary | ICD-10-CM | POA: Diagnosis not present

## 2021-01-31 DIAGNOSIS — E785 Hyperlipidemia, unspecified: Secondary | ICD-10-CM | POA: Diagnosis not present

## 2021-01-31 DIAGNOSIS — N3281 Overactive bladder: Secondary | ICD-10-CM | POA: Diagnosis not present

## 2021-01-31 DIAGNOSIS — K5900 Constipation, unspecified: Secondary | ICD-10-CM | POA: Diagnosis not present

## 2021-01-31 DIAGNOSIS — I129 Hypertensive chronic kidney disease with stage 1 through stage 4 chronic kidney disease, or unspecified chronic kidney disease: Secondary | ICD-10-CM | POA: Diagnosis not present

## 2021-01-31 DIAGNOSIS — N189 Chronic kidney disease, unspecified: Secondary | ICD-10-CM | POA: Diagnosis not present

## 2021-01-31 DIAGNOSIS — E1122 Type 2 diabetes mellitus with diabetic chronic kidney disease: Secondary | ICD-10-CM | POA: Diagnosis not present

## 2021-01-31 DIAGNOSIS — Z008 Encounter for other general examination: Secondary | ICD-10-CM | POA: Diagnosis not present

## 2021-02-01 DIAGNOSIS — E875 Hyperkalemia: Secondary | ICD-10-CM | POA: Diagnosis not present

## 2021-02-01 DIAGNOSIS — E1122 Type 2 diabetes mellitus with diabetic chronic kidney disease: Secondary | ICD-10-CM | POA: Diagnosis not present

## 2021-02-01 DIAGNOSIS — E872 Acidosis: Secondary | ICD-10-CM | POA: Diagnosis not present

## 2021-02-01 DIAGNOSIS — N1832 Chronic kidney disease, stage 3b: Secondary | ICD-10-CM | POA: Diagnosis not present

## 2021-02-01 DIAGNOSIS — I129 Hypertensive chronic kidney disease with stage 1 through stage 4 chronic kidney disease, or unspecified chronic kidney disease: Secondary | ICD-10-CM | POA: Diagnosis not present

## 2021-02-01 DIAGNOSIS — N400 Enlarged prostate without lower urinary tract symptoms: Secondary | ICD-10-CM | POA: Diagnosis not present

## 2021-02-01 DIAGNOSIS — D631 Anemia in chronic kidney disease: Secondary | ICD-10-CM | POA: Diagnosis not present

## 2021-02-01 DIAGNOSIS — N281 Cyst of kidney, acquired: Secondary | ICD-10-CM | POA: Diagnosis not present

## 2021-02-17 DIAGNOSIS — H25812 Combined forms of age-related cataract, left eye: Secondary | ICD-10-CM | POA: Diagnosis not present

## 2021-02-17 DIAGNOSIS — H2512 Age-related nuclear cataract, left eye: Secondary | ICD-10-CM | POA: Diagnosis not present

## 2021-02-23 ENCOUNTER — Ambulatory Visit: Payer: Medicare HMO | Admitting: Neurology

## 2021-02-23 ENCOUNTER — Encounter: Payer: Self-pay | Admitting: Neurology

## 2021-02-23 VITALS — BP 132/64 | HR 68 | Ht 66.0 in | Wt 147.0 lb

## 2021-02-23 DIAGNOSIS — R69 Illness, unspecified: Secondary | ICD-10-CM | POA: Diagnosis not present

## 2021-02-23 DIAGNOSIS — F0390 Unspecified dementia without behavioral disturbance: Secondary | ICD-10-CM | POA: Diagnosis not present

## 2021-02-23 DIAGNOSIS — I639 Cerebral infarction, unspecified: Secondary | ICD-10-CM | POA: Diagnosis not present

## 2021-02-23 MED ORDER — MEMANTINE HCL 10 MG PO TABS: 10.0000 mg | ORAL_TABLET | Freq: Two times a day (BID) | ORAL | 11 refills | Status: DC

## 2021-02-23 NOTE — Patient Instructions (Signed)
[facility]   Address: [address], [place], [place] [address]   Phone: [phone] 

## 2021-02-23 NOTE — Progress Notes (Signed)
Chief Complaint  Patient presents with  . Consult    Hx of CVA. Here with his daughter, Erik Moss. He has continued aspirin 91m and Plavix 749mdaily. Follow up for TIA event on 01/26/2021. Completed MRI brain at hospital.      Erik Moss   History of stroke involving right posterior limb of internal capsule in December 2020  Vascular risk factor of aging, hypertension, hyperlipidemia, diabetes  From stroke standpoint, he does not need dual antiplatelet agent, suggesting take Plavix 75 mg alone  Dementia  MoCA examination 21/30  Laboratory evaluation to rule out treatable etiology  Continue moderate exercise  Adductor Namenda 10 mg twice a day  DIAGNOSTIC DATA (LABS, IMAGING, TESTING) - I reviewed patient records, labs, notes, testing and imaging myself where available.  MRI of the brain without contrast January 26, 2021: No acute abnormality, generalized atrophy, small vessel disease  MRA of neck and head December 2020, no significant large vessel disease  MRI of the brain December 2020, small acute infarction involving right lateral thalamus, posterior limb of internal capsule  Laboratory evaluations, in December 2021, 1.71, glucose 214, A1C 10.5, CBC Hg 10.5,   Left Ventricle: Left ventricular ejection fraction, by visual estimation, is 60 to 65%. The left ventricle has normal function. The left ventricle has no regional wall motion abnormalities. There is no left ventricular hypertrophy. Right Ventricle: The right ventricular size is normal. No increase in right ventricular wall thickness. Global RV systolic function is has normal systolic function. Left Atrium: Left atrial size was normal in size. No LAA thrombus. LAA emptying velocity 91 cm/s. Right Atrium: Right atrial size was normal in size Pericardium: Trivial pericardial effusion is present. Mitral Valve: The mitral valve is grossly normal. Mild mitral valve regurgitation.  There is no evidence of mitral valve vegetation. Tricuspid Valve: The tricuspid valve is grossly normal. Tricuspid valve regurgitation is mild. Aortic Valve: The aortic valve is tricuspid. Aortic valve regurgitation is not visualized. The aortic valve is structurally normal, with no evidence of sclerosis or stenosis. Pulmonic Valve: The pulmonic valve was grossly normal. Pulmonic valve regurgitation is trivial. Aorta: The aortic root and ascending aorta are structurally normal, with no evidence of dilitation. There is severe, protruding plaque involving the aortic root. There is protruding calcified atheroma in the aortic root (best seen image 42). It is mass-like and measures 0.7 cm x 0.5 cm. This was likely seen in and out of plane on transthoracic imaging around the aortic valve. There is no mass or vegetation attached to the aortic valve. This represents severe (Grade 4) atheroma in the aortic root. There is moderate (Grade 3) atheroma in the descending aorta. Venous: The left upper pulmonary vein is and the right upper pulmonary vein is normal.   HISTORICAL  Erik Moss is a [age]ear old Moss, seen in request by his primary care nurse practitioner brake, Erik Latheor evaluation of transient confusion, he is accompanied by his daughter Erik Moss today's visit Feb 23, 2021  I reviewed and summarized the referring note.  Past medical history Hypertension Hyperlipidemia Diabetes Prostate hypertrophy  He is retired Erik Moss retired at age [age]ssince then, he completed total of 9 masters degree, Erik Moss, creative writing English, diAir cabin crewinternational reInsurance claims Moss Prior to 2020, he lives alone, moved to his daughter during pandemic, was noted to have slow worsening memory loss,   He was admitted  to hospital in December 2020, was noted to have acute onset left-sided weakness, dragging his left leg while walking  MRI  of the brain on October 08, 2019 showed small region of acute infarction involving right lateral thalamus, posterior limb of internal capsule, was also noted to have poorly controlled diabetes, A1c up to 10.5,  Extensive evaluation showed no significant large vessel disease, no significant abnormality on echocardiogram,  He was started on Plavix 75 mg daily, also taking aspirin 81 mg daily, daughter began to help in managing his medications   He recovered almost back to his baseline, he wished to register for college class, but was denied, he was noted to have worsening memory loss, he still enjoying reading newspaper, walk few miles each day,  His mother lived to more than 30 years old, began to develop mild memory loss in her 34s  In January 26, 2021, woke up in the morning, he was noted by his daughter to be slow reaction, mildly slurred speech, confused, was taken to the emergency room, MRI of the brain showed no acute abnormality    REVIEW OF SYSTEMS:  Full 14 system review of systems performed and notable only for as above All other review of systems were negative.  PHYSICAL EXAM:   Vitals:   02/23/21 1058  Weight: 147 lb (66.7 kg)  Height: '5\' 6"'  (1.676 m)   Not recorded     Body mass index is 23.73 kg/m.  PHYSICAL EXAMNIATION:  Gen: NAD, conversant, well nourised, well groomed                     Cardiovascular: Regular rate rhythm, no peripheral edema, warm, nontender. Eyes: Conjunctivae clear without exudates or hemorrhage Neck: Supple, no carotid bruits. Pulmonary: Clear to auscultation bilaterally   NEUROLOGICAL EXAM:  MENTAL STATUS: Speech:    Speech is normal; fluent and spontaneous with normal comprehension.   Montreal Cognitive Assessment  02/25/2021  Visuospatial/ Executive (0/5) 1  Naming (0/3) 3  Attention: Read list of digits (0/2) 2  Attention: Read list of letters (0/1) 1  Attention: Serial 7 subtraction starting at 100 (0/3) 3  Language: Repeat  phrase (0/2) 2  Language : Fluency (0/1) 1  Abstraction (0/2) 2  Delayed Recall (0/5) 0  Orientation (0/6) 6  Total 21     CRANIAL NERVES: CN II: Visual fields are full to confrontation. Pupils are round equal and briskly reactive to light. CN III, IV, VI: extraocular movement are normal. No ptosis. CN V: Facial sensation is intact to light touch CN VII: Face is symmetric with normal eye closure  CN VIII: Hearing is normal to causal conversation. CN IX, X: Phonation is normal. CN XI: Head turning and shoulder shrug are intact  MOTOR: There is no pronator drift of out-stretched arms. Muscle bulk and tone are normal. Muscle strength is normal.  REFLEXES: Reflexes are 2+ and symmetric at the biceps, triceps, knees, and ankles. Plantar responses are flexor.  SENSORY: Intact to light touch, pinprick and vibratory sensation are intact in fingers and toes.  COORDINATION: There is no trunk or limb dysmetria noted.  GAIT/STANCE: He can get up from seated position arm crossed, slight dragging left leg,  mildly unsteady Romberg is absent.  ALLERGIES: No Known Allergies  HOME MEDICATIONS: Current Outpatient Medications  Medication Sig Dispense Refill  . amLODipine (NORVASC) 5 MG tablet Take 5 mg by mouth daily.    Marland Kitchen aspirin EC 81 MG tablet Take 81 mg by  mouth daily. Swallow whole.    . blood glucose meter kit and supplies Dispense based on patient and insurance preference. Use up to four times daily as directed. (FOR ICD-10 E10.9, E11.9). 1 each 0  . Cholecalciferol (D3 PO) Take 1 capsule by mouth daily.    . clopidogrel (PLAVIX) 75 MG tablet Take 75 mg by mouth daily.    . diphenhydrAMINE (BENADRYL) 12.5 MG/5ML elixir Take 6.25 mg by mouth 4 (four) times daily as needed for allergies.    Marland Kitchen docusate sodium (COLACE) 100 MG capsule Take 100 mg by mouth daily.    Marland Kitchen glimepiride (AMARYL) 2 MG tablet Take 2 mg by mouth in the morning and at bedtime.    . mirabegron ER (MYRBETRIQ) 50 MG  TB24 tablet Take 50 mg by mouth daily.    . Multiple Vitamin (MULTIVITAMIN WITH MINERALS) TABS tablet Take 1 tablet by mouth daily.    . sitaGLIPtin (JANUVIA) 25 MG tablet Take 25 mg by mouth daily.    . sodium bicarbonate 650 MG tablet Take 650 mg by mouth 2 (two) times daily.    . tamsulosin (FLOMAX) 0.4 MG CAPS capsule Take 0.4 mg by mouth at bedtime.     . triamcinolone ointment (KENALOG) 0.1 % Apply 1 application topically daily as needed (itching on lower legs.).     Marland Kitchen atorvastatin (LIPITOR) 40 MG tablet Take 1 tablet (40 mg total) by mouth daily. 90 tablet 0   No current facility-administered medications for this visit.    PAST MEDICAL HISTORY: Past Medical History:  Diagnosis Date  . BPH (benign prostatic hyperplasia) 10/08/2019  . Diabetes mellitus without complication ([place])   . Hyperlipidemia 10/08/2019  . Hypertension 10/08/2019  . Renal disease   . Stroke ([place])   . TIA (transient ischemic attack)   . Type 2 diabetes mellitus ([place]) 10/08/2019    PAST SURGICAL HISTORY: Past Surgical History:  Procedure Laterality Date  . BUBBLE STUDY  10/10/2019   Procedure: BUBBLE STUDY;  Surgeon: Geralynn Rile, MD;  Location: [facility];  Service: Cardiology;;  . TEE WITHOUT CARDIOVERSION N/A 10/10/2019   Procedure: TRANSESOPHAGEAL ECHOCARDIOGRAM (TEE);  Surgeon: Geralynn Rile, MD;  Location: [facility];  Service: Cardiology;  Laterality: N/A;    FAMILY HISTORY: Family History  Problem Relation Age of Onset  . Other Mother        Passed from "old age"  . Other Father        Accident    SOCIAL HISTORY: Social History   Socioeconomic History  . Marital status: Divorced    Spouse name: Not on file  . Number of children: 1  . Years of education: collge  . Highest education level: Master's degree (e.g., MA, MS, MEng, MEd, MSW, MBA)  Occupational History  . Occupation: Retired  Tobacco Use  . Smoking status: Never Smoker  . Smokeless tobacco: Never Used   Substance and Sexual Activity  . Alcohol use: Never  . Drug use: Never  . Sexual activity: Not on file  Other Topics Concern  . Not on file  Social History Narrative   11/19/19 lives with dgtr, grand dgtr.   Left-handed.   Reports having nine masters' degrees.   1 cup tea each morning.   Social Determinants of Health   Financial Resource Strain: Not on file  Food Insecurity: Not on file  Transportation Needs: Not on file  Physical Activity: Not on file  Stress: Not on file  Social Connections: Not on file  Intimate Partner Violence: Not on file      [name], M.D. Ph.D.  [facility] [address], [place], [place] [address] Ph: [phone] Fax: [phone]  CC:  [name], [place],  [place] [address]  [name], FNP

## 2021-02-24 ENCOUNTER — Telehealth: Payer: Self-pay | Admitting: *Deleted

## 2021-02-24 LAB — RPR: RPR Ser Ql: NONREACTIVE

## 2021-02-24 LAB — TSH: TSH: 3.59 u[IU]/mL (ref 0.450–4.500)

## 2021-02-24 LAB — VITAMIN B12: Vitamin B-12: 856 pg/mL (ref 232–1245)

## 2021-02-24 NOTE — Telephone Encounter (Signed)
-----   Message from [name], MD sent at 02/24/2021  4:46 PM EDT ----- Please call patient for normal laboratory result

## 2021-02-24 NOTE — Telephone Encounter (Signed)
I spoke to his daughter on [place]. She verbalized understanding of her lab results.

## 2021-03-01 DIAGNOSIS — H25041 Posterior subcapsular polar age-related cataract, right eye: Secondary | ICD-10-CM | POA: Diagnosis not present

## 2021-03-01 DIAGNOSIS — H25011 Cortical age-related cataract, right eye: Secondary | ICD-10-CM | POA: Diagnosis not present

## 2021-03-01 DIAGNOSIS — H2511 Age-related nuclear cataract, right eye: Secondary | ICD-10-CM | POA: Diagnosis not present

## 2021-03-03 DIAGNOSIS — H25011 Cortical age-related cataract, right eye: Secondary | ICD-10-CM | POA: Diagnosis not present

## 2021-03-03 DIAGNOSIS — H25811 Combined forms of age-related cataract, right eye: Secondary | ICD-10-CM | POA: Diagnosis not present

## 2021-03-03 DIAGNOSIS — H25041 Posterior subcapsular polar age-related cataract, right eye: Secondary | ICD-10-CM | POA: Diagnosis not present

## 2021-03-03 DIAGNOSIS — H2511 Age-related nuclear cataract, right eye: Secondary | ICD-10-CM | POA: Diagnosis not present

## 2021-03-20 ENCOUNTER — Emergency Department (HOSPITAL_COMMUNITY): Payer: Medicare HMO

## 2021-03-20 ENCOUNTER — Emergency Department (HOSPITAL_COMMUNITY)
Admission: EM | Admit: 2021-03-20 | Discharge: 2021-03-20 | Disposition: A | Discharge status: Home / Self Care | Payer: Medicare HMO | Attending: Emergency Medicine | Admitting: Emergency Medicine

## 2021-03-20 ENCOUNTER — Other Ambulatory Visit: Payer: Self-pay

## 2021-03-20 DIAGNOSIS — I10 Essential (primary) hypertension: Secondary | ICD-10-CM | POA: Insufficient documentation

## 2021-03-20 DIAGNOSIS — R262 Difficulty in walking, not elsewhere classified: Secondary | ICD-10-CM | POA: Diagnosis not present

## 2021-03-20 DIAGNOSIS — R9082 White matter disease, unspecified: Secondary | ICD-10-CM | POA: Diagnosis not present

## 2021-03-20 DIAGNOSIS — Z7984 Long term (current) use of oral hypoglycemic drugs: Secondary | ICD-10-CM | POA: Diagnosis not present

## 2021-03-20 DIAGNOSIS — Z79899 Other long term (current) drug therapy: Secondary | ICD-10-CM | POA: Diagnosis not present

## 2021-03-20 DIAGNOSIS — R531 Weakness: Secondary | ICD-10-CM

## 2021-03-20 DIAGNOSIS — E119 Type 2 diabetes mellitus without complications: Secondary | ICD-10-CM | POA: Insufficient documentation

## 2021-03-20 DIAGNOSIS — Z7982 Long term (current) use of aspirin: Secondary | ICD-10-CM | POA: Insufficient documentation

## 2021-03-20 DIAGNOSIS — Z8673 Personal history of transient ischemic attack (TIA), and cerebral infarction without residual deficits: Secondary | ICD-10-CM | POA: Diagnosis not present

## 2021-03-20 DIAGNOSIS — Z7902 Long term (current) use of antithrombotics/antiplatelets: Secondary | ICD-10-CM | POA: Diagnosis not present

## 2021-03-20 LAB — CBC WITH DIFFERENTIAL/PLATELET
Abs Immature Granulocytes: 0.02 10*3/uL (ref 0.00–0.07)
Basophils Absolute: 0 10*3/uL (ref 0.0–0.1)
Basophils Relative: 0 %
Eosinophils Absolute: 0.1 10*3/uL (ref 0.0–0.5)
Eosinophils Relative: 1 %
HCT: 33.8 % — ABNORMAL LOW (ref 39.0–52.0)
Hemoglobin: 11 g/dL — ABNORMAL LOW (ref 13.0–17.0)
Immature Granulocytes: 0 %
Lymphocytes Relative: 32 %
Lymphs Abs: 2.3 10*3/uL (ref 0.7–4.0)
MCH: 31.9 pg (ref 26.0–34.0)
MCHC: 32.5 g/dL (ref 30.0–36.0)
MCV: 98 fL (ref 80.0–100.0)
Monocytes Absolute: 0.6 10*3/uL (ref 0.1–1.0)
Monocytes Relative: 8 %
Neutro Abs: 4.1 10*3/uL (ref 1.7–7.7)
Neutrophils Relative %: 59 %
Platelets: 292 10*3/uL (ref 150–400)
RBC: 3.45 MIL/uL — ABNORMAL LOW (ref 4.22–5.81)
RDW: 14.6 % (ref 11.5–15.5)
WBC: 7.1 10*3/uL (ref 4.0–10.5)
nRBC: 0 % (ref 0.0–0.2)

## 2021-03-20 LAB — URINALYSIS, ROUTINE W REFLEX MICROSCOPIC
Bilirubin Urine: NEGATIVE
Glucose, UA: NEGATIVE mg/dL
Hgb urine dipstick: NEGATIVE
Ketones, ur: NEGATIVE mg/dL
Leukocytes,Ua: NEGATIVE
Nitrite: NEGATIVE
Protein, ur: NEGATIVE mg/dL
Specific Gravity, Urine: 1.011 (ref 1.005–1.030)
pH: 6 (ref 5.0–8.0)

## 2021-03-20 LAB — COMPREHENSIVE METABOLIC PANEL
ALT: 36 U/L (ref 0–44)
AST: 23 U/L (ref 15–41)
Albumin: 3.9 g/dL (ref 3.5–5.0)
Alkaline Phosphatase: 66 U/L (ref 38–126)
Anion gap: 6 (ref 5–15)
BUN: 27 mg/dL — ABNORMAL HIGH (ref 8–23)
CO2: 25 mmol/L (ref 22–32)
Calcium: 9.3 mg/dL (ref 8.9–10.3)
Chloride: 107 mmol/L (ref 98–111)
Creatinine, Ser: 2.22 mg/dL — ABNORMAL HIGH (ref 0.61–1.24)
GFR, Estimated: 28 mL/min — ABNORMAL LOW (ref >60)
Glucose, Bld: 129 mg/dL — ABNORMAL HIGH (ref 70–99)
Potassium: 4.6 mmol/L (ref 3.5–5.1)
Sodium: 138 mmol/L (ref 135–145)
Total Bilirubin: 0.5 mg/dL (ref 0.3–1.2)
Total Protein: 6.6 g/dL (ref 6.5–8.1)

## 2021-03-20 IMAGING — CT CT HEAD W/O CM
3 series · 14 of 47 positions shown, 16 images · non-contrast
Comparison: [DATE]

CLINICAL DATA: Fall down stairs

EXAM:
CT HEAD WITHOUT CONTRAST
TECHNIQUE: Contiguous axial images were obtained from the base of the skull
through the vertex without intravenous contrast.

[Series 2: head wo · axial · 0.49mm/px · z∈[-139,-4]mm · 8 of 33 slices shown, 10 images]
[im 3/33  brain]
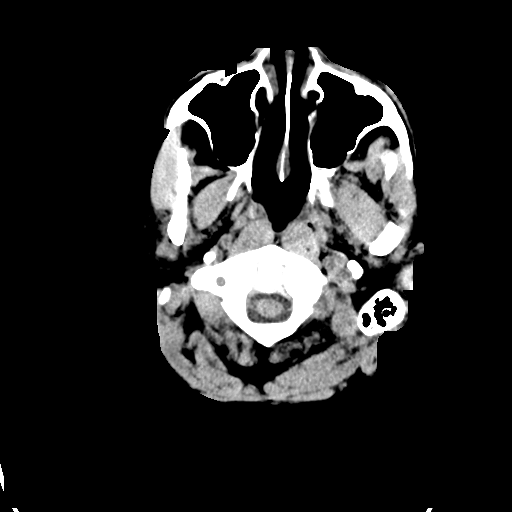
[im 3/33  bone]
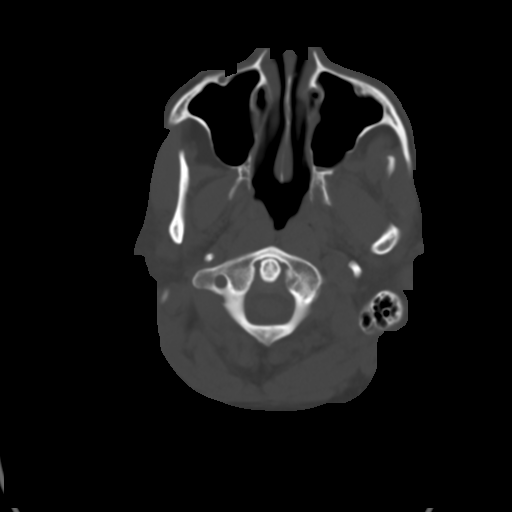
[im 7/33  brain]
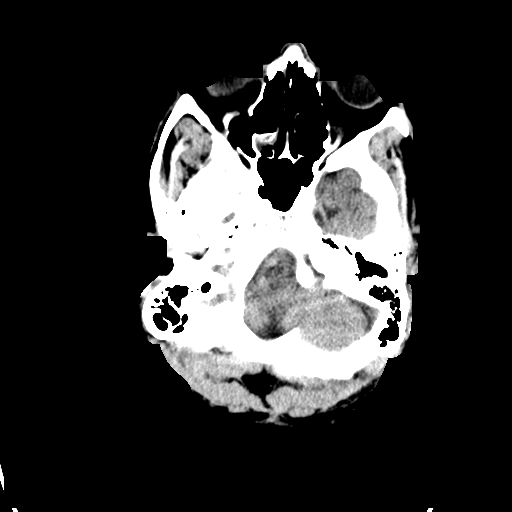
[im 10/33  brain]
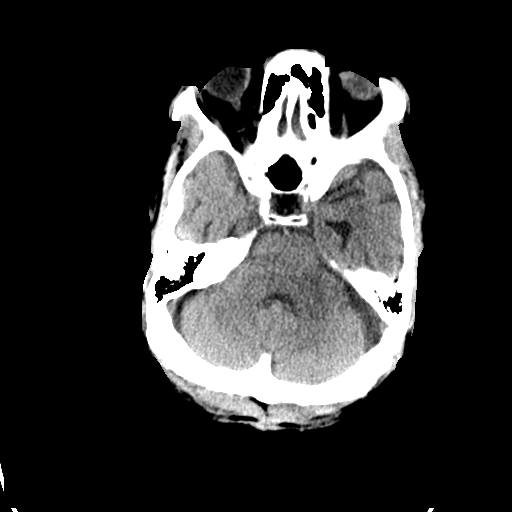
[im 15/33  brain]
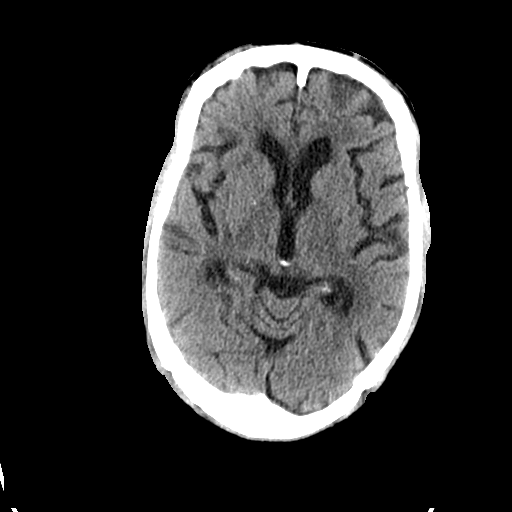
[im 18/33  brain]
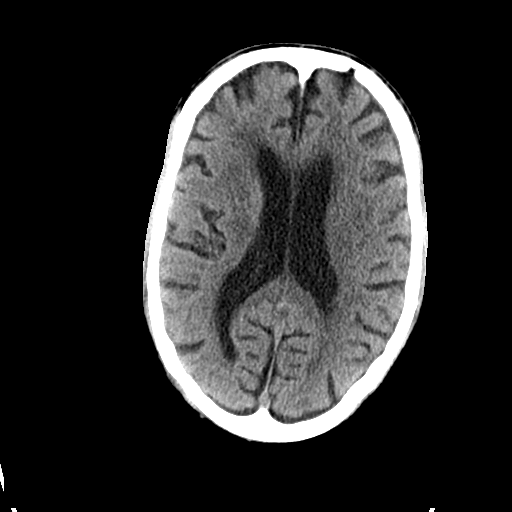
[im 18/33  bone]
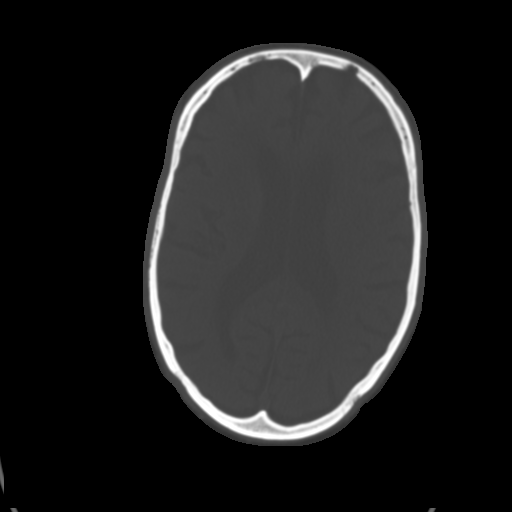
[im 23/33  brain]
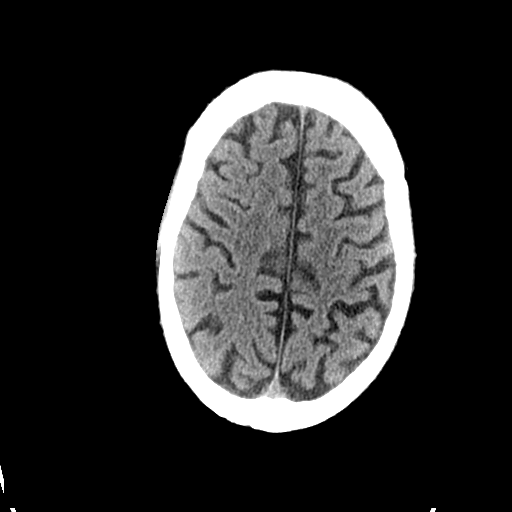
[im 26/33  brain]
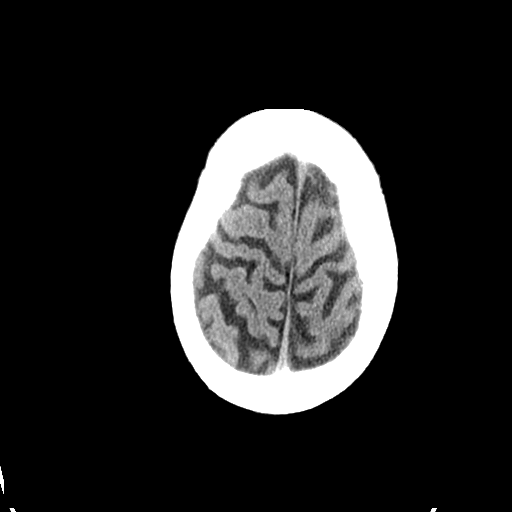
[im 30/33  brain]
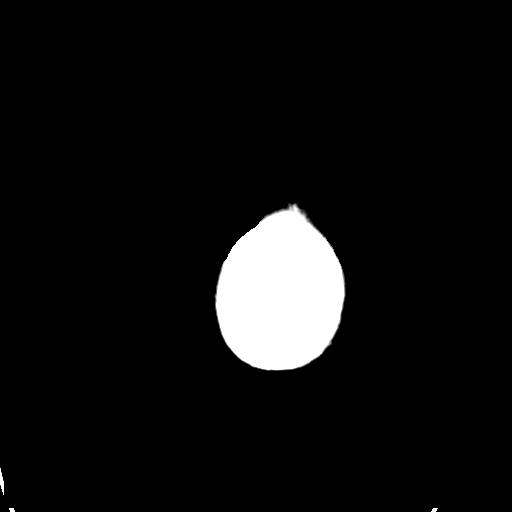

[Series 5: coronal soft tissue · coronal · 0.32mm/px · 3 of 77 slices shown]
[im 26/77  brain]
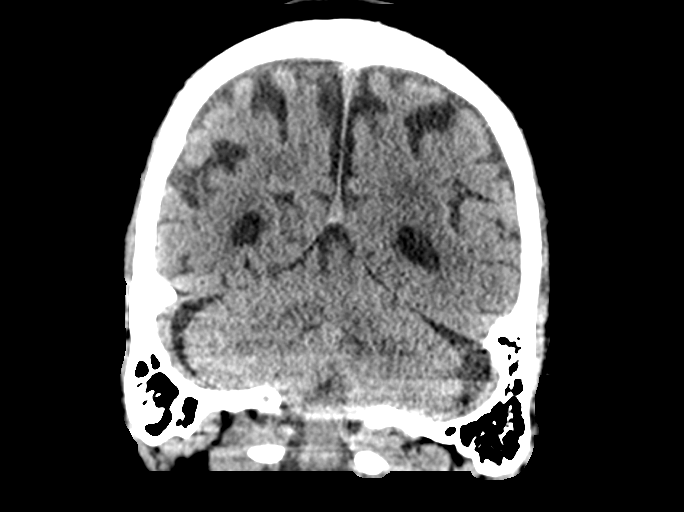
[im 34/77  brain]
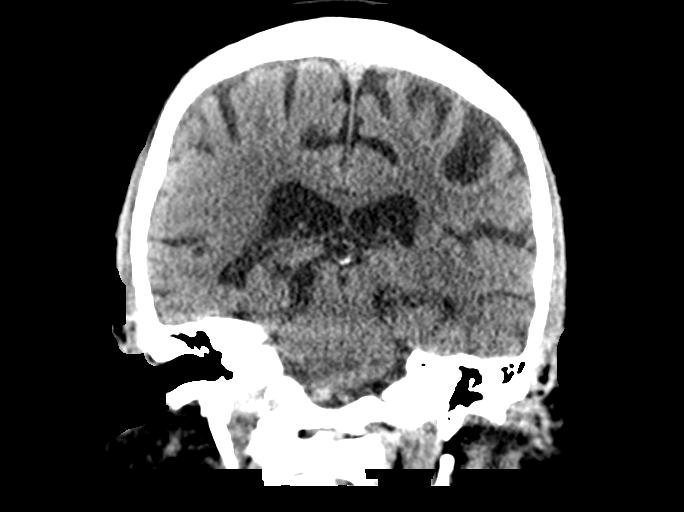
[im 43/77  brain]
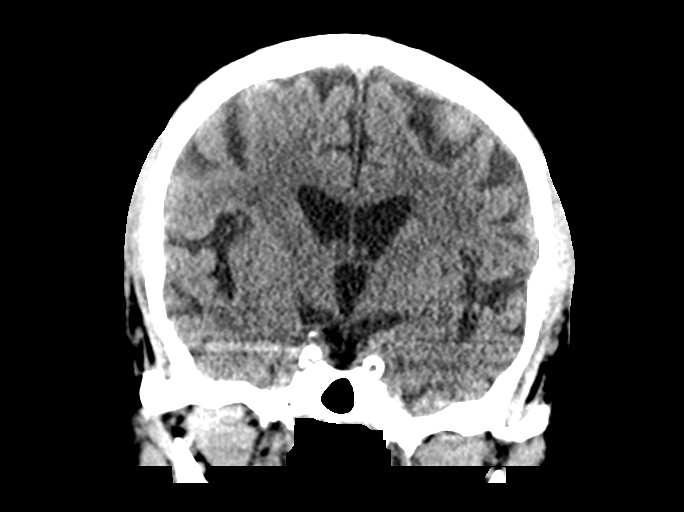

[Series 6: sagittal soft tissue · sagittal · 0.32mm/px · 3 of 62 slices shown]
[im 21/62  brain]
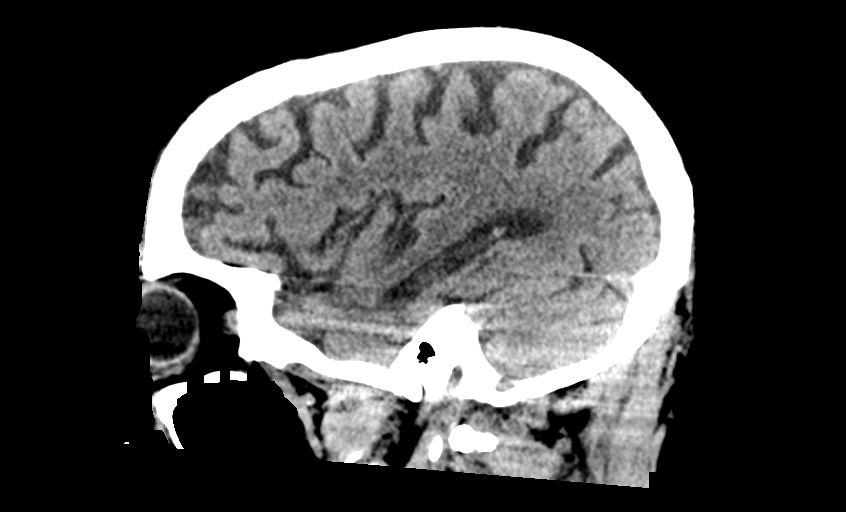
[im 31/62  brain]
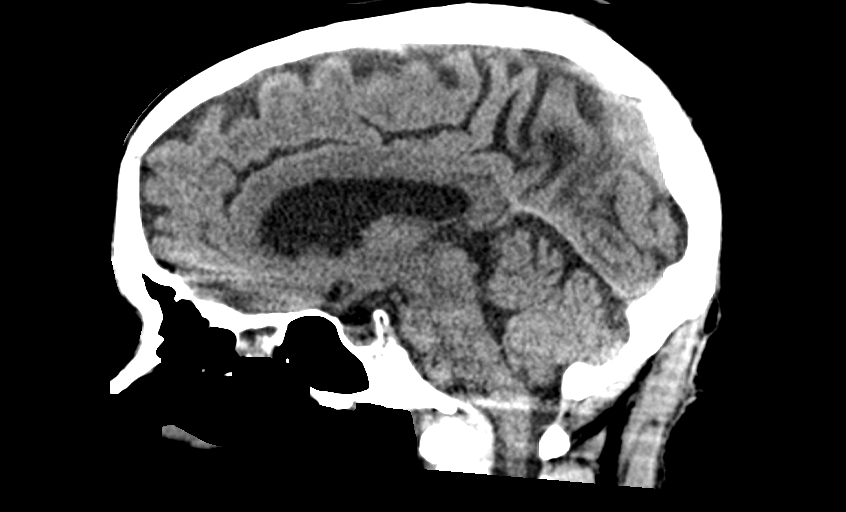
[im 41/62  brain]
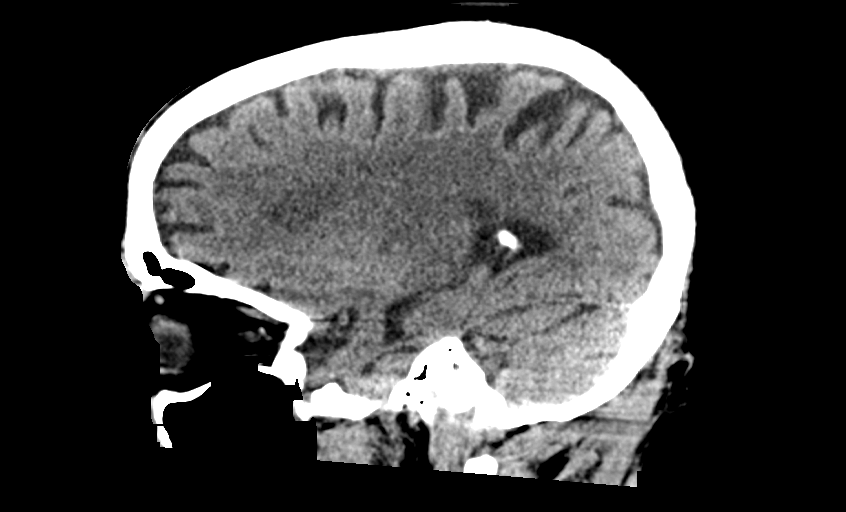

[14 of 47 positions shown; findings below may reference images not displayed]

FINDINGS: Brain: No evidence of acute infarction, hemorrhage, hydrocephalus,
extra-axial collection or mass lesion/mass effect. Periventricular
white matter hypodensity.

Vascular: No hyperdense vessel or unexpected calcification.

Skull: Normal. Negative for fracture or focal lesion.

Sinuses/Orbits: No acute finding.

Other: None.
IMPRESSION: No acute intracranial pathology. Small-vessel white matter disease.

## 2021-03-20 NOTE — ED Triage Notes (Signed)
Patient states his 87 year old granddaughter said he fell down the stairs this morning. Patient says he doesn't remember falling and doesn't know if the grandchild witness a fall or just heard a noise. States the granddaughter was walking with him at breakfast and stated he wasn't walking right., patient denies pain in triage.

## 2021-03-20 NOTE — ED Provider Notes (Signed)
Emergency Medicine Provider Triage Evaluation Note  Erik Moss , a 87 y.o. male  was evaluated in triage.  Pt complains of fall. Per pt his granddaughter was concerned that he had fallen down the stairs this am. He does no recall the event. Later in the day his daughter felt that he was having difficulty walking. Pt denies any complaints or symptoms.  Review of Systems  Positive: fall Negative: Neck pain, back pain  Physical Exam  BP 134/67 (BP Location: Left Arm)   Pulse 78   Temp 98.5 F (36.9 C) (Oral)   Resp 18   Ht '5\' 6"'$  (1.676 m)   Wt 70.3 kg   SpO2 99%   BMI 25.02 kg/m  Gen:   Awake, no distress   Resp:  Normal effort  MSK:   Moves extremities without difficulty  Other:  Clear speech, no facial droop, no focal deficits, no midline ttp  Medical Decision Making  Medically screening exam initiated at 3:26 PM.  Appropriate orders placed.  [place] was informed that the remainder of the evaluation will be completed by another provider, this initial triage assessment does not replace that evaluation, and the importance of remaining in the ED until their evaluation is complete.     Bishop Dublin 03/20/21 1528    Dorie Rank, MD 03/20/21 2159

## 2021-03-20 NOTE — ED Provider Notes (Signed)
[facility] DEPT Provider Note   CSN: [id number] Arrival date & time: 03/20/21  1419     History Chief Complaint  Patient presents with  . Erik Moss is a 87 y.o. male.  Erik Moss presented to the ER because he had 2 episodes of difficulty walking today.  Erik granddaughter thought that he possibly stumbled while going downstairs.  It looked like he was having difficulty walking.  He went out to breakfast, and while getting into the car, it also appeared that he had difficulty walking.  He has a history of stroke and was recently evaluated here in the emergency department for a neurologic complaint.  He had MRI in April of this year and is on maximal medical management.  He is a patient of Erik Moss.  The history is provided by the patient and a relative (Erik Moss). History limited by: He has a history of mild dementia.  Weakness Severity:  Moderate Onset quality:  Sudden Timing:  Intermittent Progression:  Resolved Chronicity:  New Context: not change in medication and not recent infection   Relieved by:  Nothing Worsened by:  Nothing Ineffective treatments:  None tried Associated symptoms: difficulty walking   Associated symptoms: no abdominal pain, no arthralgias, no chest pain, no cough, no dysuria, no fever, no seizures, no shortness of breath, no vision change and no vomiting        Past Medical History:  Diagnosis Date  . BPH (benign prostatic hyperplasia) 10/08/2019  . Diabetes mellitus without complication ([place])   . Hyperlipidemia 10/08/2019  . Hypertension 10/08/2019  . Renal disease   . Stroke ([place])   . TIA (transient ischemic attack)   . Type 2 diabetes mellitus ([name]) 10/08/2019    Patient Active Problem List   Diagnosis Date Noted  . Dementia without behavioral disturbance ([name]) 02/23/2021  . Cerebrovascular accident (CVA) ([name]) 02/23/2021  . Hyperglycemia due to diabetes  mellitus ([name]) 09/24/2020  . Acute CVA (cerebrovascular accident) ([name]) 10/08/2019  . Type 2 diabetes mellitus ([name]) 10/08/2019  . Hyperlipidemia 10/08/2019  . BPH (benign prostatic hyperplasia) 10/08/2019  . Hypertension 10/08/2019  . ARF (acute renal failure) ([name]) 05/08/2019    Past Surgical History:  Procedure Laterality Date  . BUBBLE STUDY  10/10/2019   Procedure: BUBBLE STUDY;  Surgeon: Geralynn Rile, MD;  Location: [name];  Service: Cardiology;;  . TEE WITHOUT CARDIOVERSION N/A 10/10/2019   Procedure: TRANSESOPHAGEAL ECHOCARDIOGRAM (TEE);  Surgeon: Geralynn Rile, MD;  Location: [name];  Service: Cardiology;  Laterality: N/A;       Family History  Problem Relation Age of Onset  . Other Mother        Passed from "old age"  . Other Father        Accident    Social History   Tobacco Use  . Smoking status: Never Smoker  . Smokeless tobacco: Never Used  Substance Use Topics  . Alcohol use: Never  . Drug use: Never    Home Medications Prior to Admission medications   Medication Sig Start Date End Date Taking? Authorizing Provider  amLODipine (NORVASC) 5 MG tablet Take 5 mg by mouth daily.    [provider]  aspirin EC 81 MG tablet Take 81 mg by mouth daily. Swallow whole.    [provider]  atorvastatin (LIPITOR) 40 MG tablet Take 1 tablet (40 mg total) by mouth daily. 10/10/19 09/24/20  [place] [name], DO  blood glucose meter kit and supplies Dispense based on patient and insurance preference. Use up to four times daily as directed. (FOR ICD-10 E10.9, E11.9). 09/25/20   Erik Pert, MD  Cholecalciferol (D3 PO) Take 1 capsule by mouth daily.    [provider]  clopidogrel (PLAVIX) 75 MG tablet Take 75 mg by mouth daily. 11/05/19   [provider]  diphenhydrAMINE (BENADRYL) 12.5 MG/5ML elixir Take 6.25 mg by mouth 4 (four) times daily as needed for allergies.    [provider]  docusate sodium  (COLACE) 100 MG capsule Take 100 mg by mouth daily.    [provider]  glimepiride (AMARYL) 2 MG tablet Take 2 mg by mouth in the morning and at bedtime.    [provider]  memantine (NAMENDA) 10 MG tablet Take 1 tablet (10 mg total) by mouth 2 (two) times daily. 02/23/21   Erik Pacas, MD  mirabegron ER (MYRBETRIQ) 50 MG TB24 tablet Take 50 mg by mouth daily.    [provider]  Multiple Vitamin (MULTIVITAMIN WITH MINERALS) TABS tablet Take 1 tablet by mouth daily.    [provider]  sitaGLIPtin (JANUVIA) 25 MG tablet Take 25 mg by mouth daily.    [provider]  sodium bicarbonate 650 MG tablet Take 650 mg by mouth 2 (two) times daily.    [provider]  tamsulosin (FLOMAX) 0.4 MG CAPS capsule Take 0.4 mg by mouth at bedtime.     [provider]  triamcinolone ointment (KENALOG) 0.1 % Apply 1 application topically daily as needed (itching on lower legs.).     [provider]    Allergies    Patient has no known allergies.  Review of Systems   Review of Systems  Constitutional: Negative for chills and fever.  HENT: Negative for ear pain and sore throat.   Eyes: Negative for pain and visual disturbance.  Respiratory: Negative for cough and shortness of breath.   Cardiovascular: Negative for chest pain and palpitations.  Gastrointestinal: Negative for abdominal pain and vomiting.  Genitourinary: Negative for dysuria and hematuria.  Musculoskeletal: Negative for arthralgias and back pain.  Skin: Negative for color change and rash.  Neurological: Positive for weakness. Negative for seizures and syncope.  All other systems reviewed and are negative.   Physical Exam Updated Vital Signs BP (!) 151/81 (BP Location: Left Arm)   Pulse 83   Temp 98.2 F (36.8 C) (Oral)   Resp 16   Ht '5\' 6"'  (1.676 m)   Wt 70.3 kg   SpO2 100%   BMI 25.02 kg/m   Physical Exam Vitals and nursing note reviewed.  Constitutional:       Appearance: He is well-developed.  HENT:     Head: Normocephalic and atraumatic.  Eyes:     Extraocular Movements: Extraocular movements intact.     Conjunctiva/sclera: Conjunctivae normal.     Pupils: Pupils are equal, round, and reactive to light.  Cardiovascular:     Rate and Rhythm: Normal rate and regular rhythm.     Heart sounds: No murmur heard.   Pulmonary:     Effort: Pulmonary effort is normal. No respiratory distress.     Breath sounds: Normal breath sounds.  Abdominal:     Palpations: Abdomen is soft.     Tenderness: There is no abdominal tenderness.  Musculoskeletal:     Cervical back: Neck supple.  Skin:    General: Skin is warm and dry.  Neurological:  Mental Status: He is alert. Mental status is at baseline.     Cranial Nerves: No cranial nerve deficit.     Sensory: No sensory deficit.     Motor: No weakness.     Coordination: Coordination normal.     Gait: Gait normal.  Psychiatric:        Mood and Affect: Mood normal.        Behavior: Behavior normal.     ED Results / Procedures / Treatments   Labs (all labs ordered are listed, but only abnormal results are displayed) Labs Reviewed  CBC WITH DIFFERENTIAL/PLATELET - Abnormal; Notable for the following components:      Result Value   RBC 3.45 (*)    Hemoglobin 11.0 (*)    HCT 33.8 (*)    All other components within normal limits  COMPREHENSIVE METABOLIC PANEL - Abnormal; Notable for the following components:   Glucose, Bld 129 (*)    BUN 27 (*)    Creatinine, Ser 2.22 (*)    GFR, Estimated 28 (*)    All other components within normal limits  URINALYSIS, ROUTINE W REFLEX MICROSCOPIC    EKG EKG Interpretation  Date/Time:  Saturday Mar 20 2021 16:58:41 EDT Ventricular Rate:  81 PR Interval:  227 QRS Duration: 88 QT Interval:  359 QTC Calculation: 417 R Axis:   -43 Text Interpretation: Sinus rhythm Prolonged PR interval Left anterior fascicular block Anterior infarct, old 12 Lead;  [place] No acute ischemia Confirmed by Lorre Munroe ([id number]) on 03/20/2021 9:06:27 PM   Radiology CT Head Wo Contrast  Result Date: 03/20/2021 CLINICAL DATA:  Fall down stairs EXAM: CT HEAD WITHOUT CONTRAST TECHNIQUE: Contiguous axial images were obtained from the base of the skull through the vertex without intravenous contrast. COMPARISON:  10/08/2019 FINDINGS: Brain: No evidence of acute infarction, hemorrhage, hydrocephalus, extra-axial collection or mass lesion/mass effect. Periventricular white matter hypodensity. Vascular: No hyperdense vessel or unexpected calcification. Skull: Normal. Negative for fracture or focal lesion. Sinuses/Orbits: No acute finding. Other: None. IMPRESSION: No acute intracranial pathology. Small-vessel white matter disease. Electronically Signed   By: Eddie Candle M.D.   On: 03/20/2021 16:00    Procedures Procedures   Medications Ordered in ED Medications - No data to display  ED Course  I have reviewed the triage vital signs and the nursing notes.  Pertinent labs & imaging results that were available during my care of the patient were reviewed by me and considered in my medical decision making (see chart for details).    MDM Rules/Calculators/A&P                          [place] presents with possible difficulty walking.  He has been here in the ED for quite some time, and he is currently asymptomatic.  ED work-up failed to reveal acute abnormality.  As documented, he is maximally medically managed for stroke.  Episode today could have been TIA, but it also could have been related to Erik underlying dementia or any number of other things.  Given that he is well-appearing and currently asymptomatic and that Erik head CT and lab work appears to be at Erik baseline, he will be discharged home with careful return precautions and recommendations for follow-up should things change. Final Clinical Impression(s) / ED Diagnoses Final diagnoses:  Weakness     Rx / DC Orders ED Discharge Orders    None       Lorre Munroe  G, MD 03/20/21 2126

## 2021-04-14 DIAGNOSIS — D649 Anemia, unspecified: Secondary | ICD-10-CM | POA: Diagnosis not present

## 2021-04-14 DIAGNOSIS — N184 Chronic kidney disease, stage 4 (severe): Secondary | ICD-10-CM | POA: Diagnosis not present

## 2021-04-14 DIAGNOSIS — I10 Essential (primary) hypertension: Secondary | ICD-10-CM | POA: Diagnosis not present

## 2021-04-14 DIAGNOSIS — R319 Hematuria, unspecified: Secondary | ICD-10-CM | POA: Diagnosis not present

## 2021-04-14 DIAGNOSIS — E1169 Type 2 diabetes mellitus with other specified complication: Secondary | ICD-10-CM | POA: Diagnosis not present

## 2021-04-14 DIAGNOSIS — R413 Other amnesia: Secondary | ICD-10-CM | POA: Diagnosis not present

## 2021-04-14 DIAGNOSIS — Z6824 Body mass index (BMI) 24.0-24.9, adult: Secondary | ICD-10-CM | POA: Diagnosis not present

## 2021-06-02 DIAGNOSIS — N1832 Chronic kidney disease, stage 3b: Secondary | ICD-10-CM | POA: Diagnosis not present

## 2021-06-10 DIAGNOSIS — E1122 Type 2 diabetes mellitus with diabetic chronic kidney disease: Secondary | ICD-10-CM | POA: Diagnosis not present

## 2021-06-10 DIAGNOSIS — E875 Hyperkalemia: Secondary | ICD-10-CM | POA: Diagnosis not present

## 2021-06-10 DIAGNOSIS — D631 Anemia in chronic kidney disease: Secondary | ICD-10-CM | POA: Diagnosis not present

## 2021-06-10 DIAGNOSIS — R69 Illness, unspecified: Secondary | ICD-10-CM | POA: Diagnosis not present

## 2021-06-10 DIAGNOSIS — N400 Enlarged prostate without lower urinary tract symptoms: Secondary | ICD-10-CM | POA: Diagnosis not present

## 2021-06-10 DIAGNOSIS — E872 Acidosis: Secondary | ICD-10-CM | POA: Diagnosis not present

## 2021-06-10 DIAGNOSIS — N281 Cyst of kidney, acquired: Secondary | ICD-10-CM | POA: Diagnosis not present

## 2021-06-10 DIAGNOSIS — I129 Hypertensive chronic kidney disease with stage 1 through stage 4 chronic kidney disease, or unspecified chronic kidney disease: Secondary | ICD-10-CM | POA: Diagnosis not present

## 2021-06-10 DIAGNOSIS — N1832 Chronic kidney disease, stage 3b: Secondary | ICD-10-CM | POA: Diagnosis not present

## 2021-08-22 ENCOUNTER — Emergency Department (HOSPITAL_COMMUNITY)
Admission: EM | Admit: 2021-08-22 | Discharge: 2021-08-23 | Disposition: A | Discharge status: Home / Self Care | Payer: Medicare HMO | Attending: Emergency Medicine | Admitting: Emergency Medicine

## 2021-08-22 ENCOUNTER — Emergency Department (HOSPITAL_COMMUNITY): Payer: Medicare HMO

## 2021-08-22 ENCOUNTER — Other Ambulatory Visit: Payer: Self-pay

## 2021-08-22 DIAGNOSIS — J101 Influenza due to other identified influenza virus with other respiratory manifestations: Secondary | ICD-10-CM | POA: Diagnosis not present

## 2021-08-22 DIAGNOSIS — R531 Weakness: Secondary | ICD-10-CM | POA: Diagnosis not present

## 2021-08-22 DIAGNOSIS — Z7984 Long term (current) use of oral hypoglycemic drugs: Secondary | ICD-10-CM | POA: Insufficient documentation

## 2021-08-22 DIAGNOSIS — E119 Type 2 diabetes mellitus without complications: Secondary | ICD-10-CM | POA: Diagnosis not present

## 2021-08-22 DIAGNOSIS — R2681 Unsteadiness on feet: Secondary | ICD-10-CM | POA: Diagnosis not present

## 2021-08-22 DIAGNOSIS — R410 Disorientation, unspecified: Secondary | ICD-10-CM | POA: Diagnosis not present

## 2021-08-22 DIAGNOSIS — Z7902 Long term (current) use of antithrombotics/antiplatelets: Secondary | ICD-10-CM | POA: Insufficient documentation

## 2021-08-22 DIAGNOSIS — Z7982 Long term (current) use of aspirin: Secondary | ICD-10-CM | POA: Insufficient documentation

## 2021-08-22 DIAGNOSIS — F0390 Unspecified dementia without behavioral disturbance: Secondary | ICD-10-CM | POA: Diagnosis not present

## 2021-08-22 DIAGNOSIS — Z79899 Other long term (current) drug therapy: Secondary | ICD-10-CM | POA: Diagnosis not present

## 2021-08-22 DIAGNOSIS — G319 Degenerative disease of nervous system, unspecified: Secondary | ICD-10-CM | POA: Diagnosis not present

## 2021-08-22 DIAGNOSIS — I499 Cardiac arrhythmia, unspecified: Secondary | ICD-10-CM | POA: Diagnosis not present

## 2021-08-22 DIAGNOSIS — Z743 Need for continuous supervision: Secondary | ICD-10-CM | POA: Diagnosis not present

## 2021-08-22 DIAGNOSIS — R0902 Hypoxemia: Secondary | ICD-10-CM | POA: Diagnosis not present

## 2021-08-22 DIAGNOSIS — R262 Difficulty in walking, not elsewhere classified: Secondary | ICD-10-CM | POA: Diagnosis not present

## 2021-08-22 DIAGNOSIS — R791 Abnormal coagulation profile: Secondary | ICD-10-CM | POA: Diagnosis not present

## 2021-08-22 DIAGNOSIS — I10 Essential (primary) hypertension: Secondary | ICD-10-CM | POA: Diagnosis not present

## 2021-08-22 DIAGNOSIS — R2689 Other abnormalities of gait and mobility: Secondary | ICD-10-CM | POA: Insufficient documentation

## 2021-08-22 DIAGNOSIS — Z20822 Contact with and (suspected) exposure to covid-19: Secondary | ICD-10-CM | POA: Diagnosis not present

## 2021-08-22 DIAGNOSIS — D649 Anemia, unspecified: Secondary | ICD-10-CM | POA: Insufficient documentation

## 2021-08-22 DIAGNOSIS — I440 Atrioventricular block, first degree: Secondary | ICD-10-CM | POA: Diagnosis not present

## 2021-08-22 DIAGNOSIS — R69 Illness, unspecified: Secondary | ICD-10-CM | POA: Diagnosis not present

## 2021-08-22 LAB — COMPREHENSIVE METABOLIC PANEL
ALT: 30 U/L (ref 0–44)
AST: 21 U/L (ref 15–41)
Albumin: 3.9 g/dL (ref 3.5–5.0)
Alkaline Phosphatase: 72 U/L (ref 38–126)
Anion gap: 11 (ref 5–15)
BUN: 41 mg/dL — ABNORMAL HIGH (ref 8–23)
CO2: 19 mmol/L — ABNORMAL LOW (ref 22–32)
Calcium: 9.3 mg/dL (ref 8.9–10.3)
Chloride: 107 mmol/L (ref 98–111)
Creatinine, Ser: 2.78 mg/dL — ABNORMAL HIGH (ref 0.61–1.24)
GFR, Estimated: 21 mL/min — ABNORMAL LOW (ref >60)
Glucose, Bld: 99 mg/dL (ref 70–99)
Potassium: 4.8 mmol/L (ref 3.5–5.1)
Sodium: 137 mmol/L (ref 135–145)
Total Bilirubin: 0.9 mg/dL (ref 0.3–1.2)
Total Protein: 6.9 g/dL (ref 6.5–8.1)

## 2021-08-22 LAB — DIFFERENTIAL
Abs Immature Granulocytes: 0.05 10*3/uL (ref 0.00–0.07)
Basophils Absolute: 0 10*3/uL (ref 0.0–0.1)
Basophils Relative: 0 %
Eosinophils Absolute: 0 10*3/uL (ref 0.0–0.5)
Eosinophils Relative: 0 %
Immature Granulocytes: 1 %
Lymphocytes Relative: 11 %
Lymphs Abs: 1 10*3/uL (ref 0.7–4.0)
Monocytes Absolute: 0.7 10*3/uL (ref 0.1–1.0)
Monocytes Relative: 7 %
Neutro Abs: 7.4 10*3/uL (ref 1.7–7.7)
Neutrophils Relative %: 81 %

## 2021-08-22 LAB — I-STAT CHEM 8, ED
BUN: 45 mg/dL — ABNORMAL HIGH (ref 8–23)
Calcium, Ion: 1.11 mmol/L — ABNORMAL LOW (ref 1.15–1.40)
Chloride: 109 mmol/L (ref 98–111)
Creatinine, Ser: 3 mg/dL — ABNORMAL HIGH (ref 0.61–1.24)
Glucose, Bld: 93 mg/dL (ref 70–99)
HCT: 34 % — ABNORMAL LOW (ref 39.0–52.0)
Hemoglobin: 11.6 g/dL — ABNORMAL LOW (ref 13.0–17.0)
Potassium: 4.9 mmol/L (ref 3.5–5.1)
Sodium: 139 mmol/L (ref 135–145)
TCO2: 21 mmol/L — ABNORMAL LOW (ref 22–32)

## 2021-08-22 LAB — CBC
HCT: 34.9 % — ABNORMAL LOW (ref 39.0–52.0)
Hemoglobin: 11.4 g/dL — ABNORMAL LOW (ref 13.0–17.0)
MCH: 31.2 pg (ref 26.0–34.0)
MCHC: 32.7 g/dL (ref 30.0–36.0)
MCV: 95.6 fL (ref 80.0–100.0)
Platelets: 255 10*3/uL (ref 150–400)
RBC: 3.65 MIL/uL — ABNORMAL LOW (ref 4.22–5.81)
RDW: 14.3 % (ref 11.5–15.5)
WBC: 9.2 10*3/uL (ref 4.0–10.5)
nRBC: 0 % (ref 0.0–0.2)

## 2021-08-22 LAB — PROTIME-INR
INR: 1.1 (ref 0.8–1.2)
Prothrombin Time: 14.3 seconds (ref 11.4–15.2)

## 2021-08-22 LAB — ETHANOL: Alcohol, Ethyl (B): <10 mg/dL (ref <10)

## 2021-08-22 LAB — APTT: aPTT: 27 seconds (ref 24–36)

## 2021-08-22 IMAGING — CT CT HEAD W/O CM
4 series · 16 of 47 positions shown, 18 images · non-contrast
Comparison: [DATE]

CLINICAL DATA: TIA, weakness, unsteady gait

EXAM:
CT HEAD WITHOUT CONTRAST
TECHNIQUE: Contiguous axial images were obtained from the base of the skull
through the vertex without intravenous contrast.

[Series 3: head without · axial · non-contrast · 0.50mm/px · z∈[-90,+35]mm · 7 of 35 slices shown, 9 images]
[im 5/35  brain]
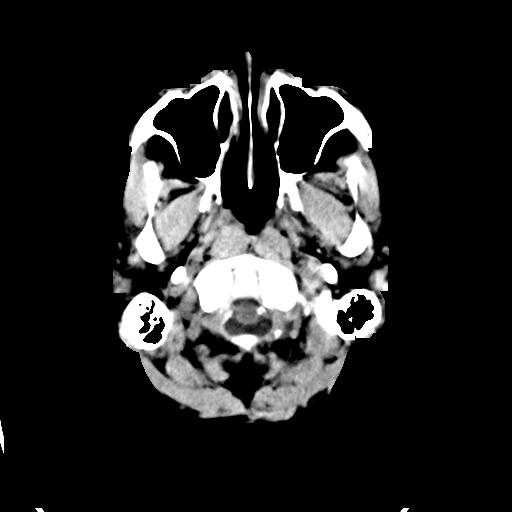
[im 5/35  bone]
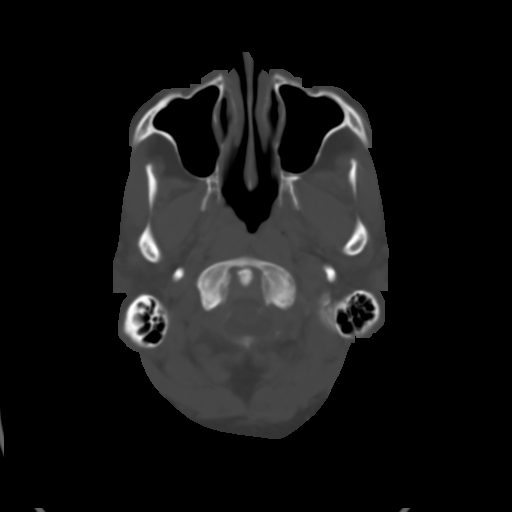
[im 9/35  brain]
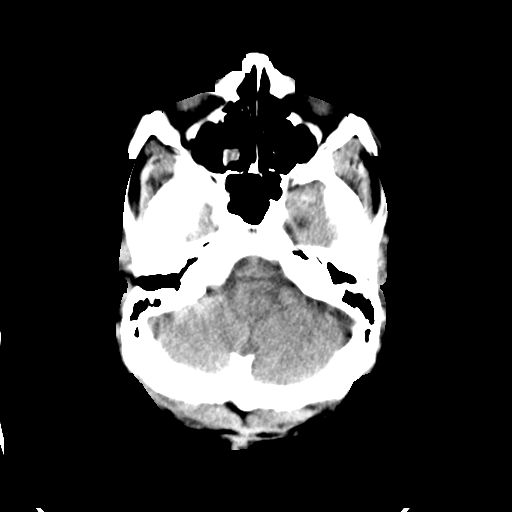
[im 13/35  brain]
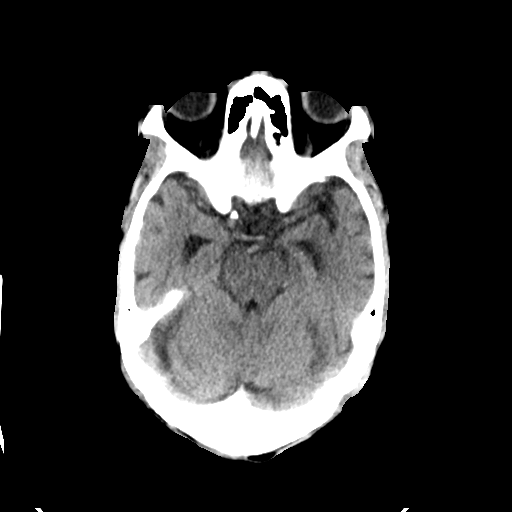
[im 18/35  brain]
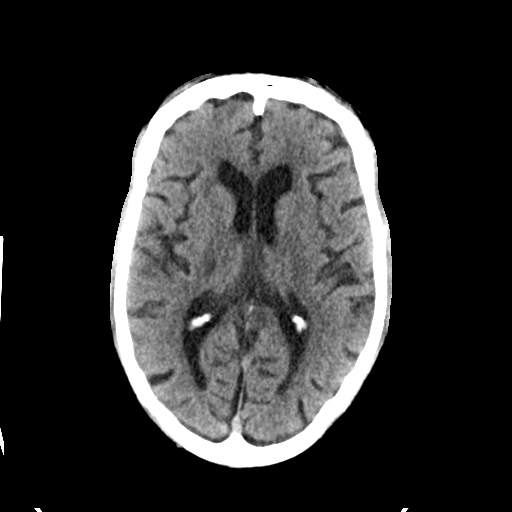
[im 22/35  brain]
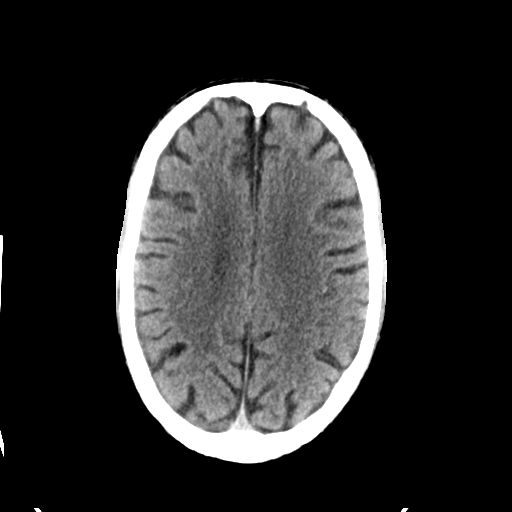
[im 22/35  bone]
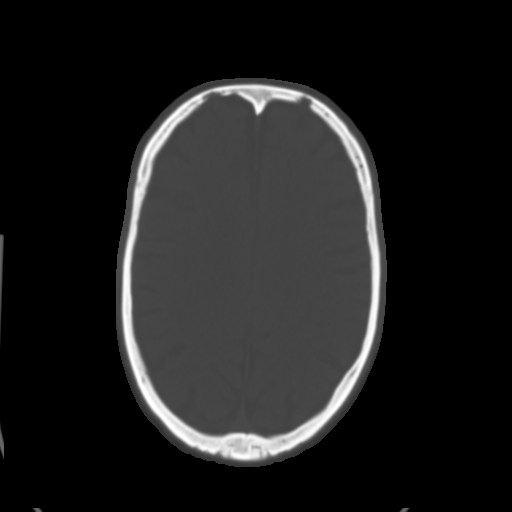
[im 26/35  brain]
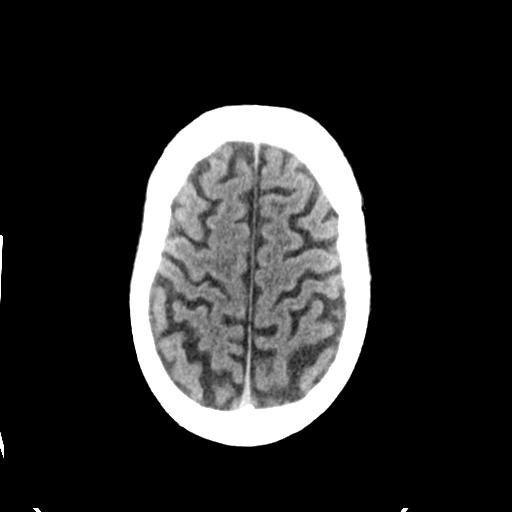
[im 30/35  brain]
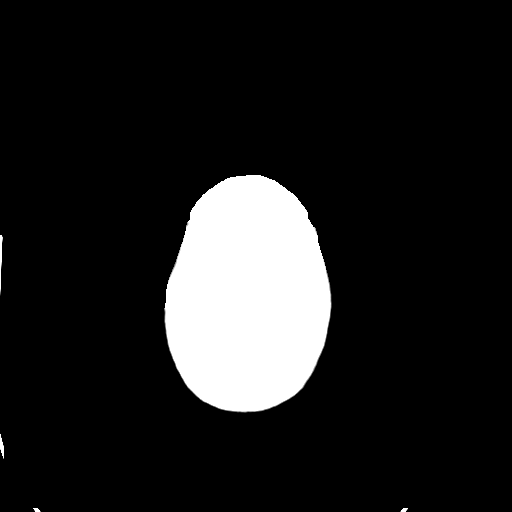

[Series 4: head bone · axial · 0.50mm/px · z∈[-94,-60]mm · 3 of 87 slices shown]
[im 9/87  bone]
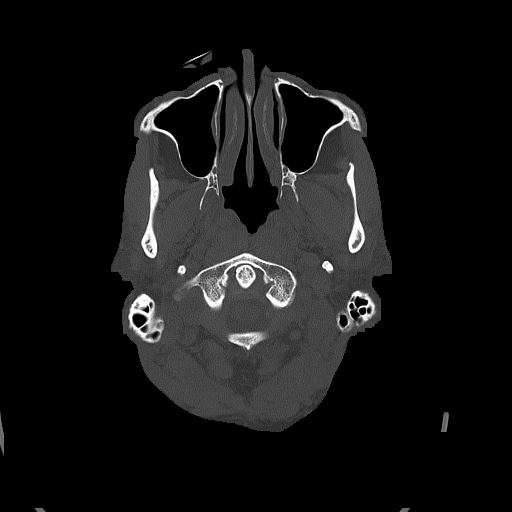
[im 18/87  bone]
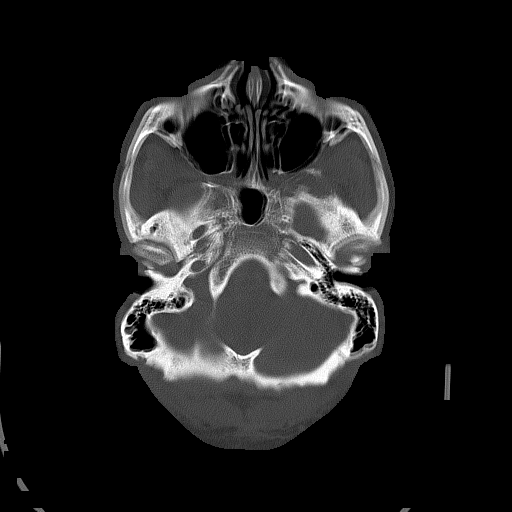
[im 26/87  bone]
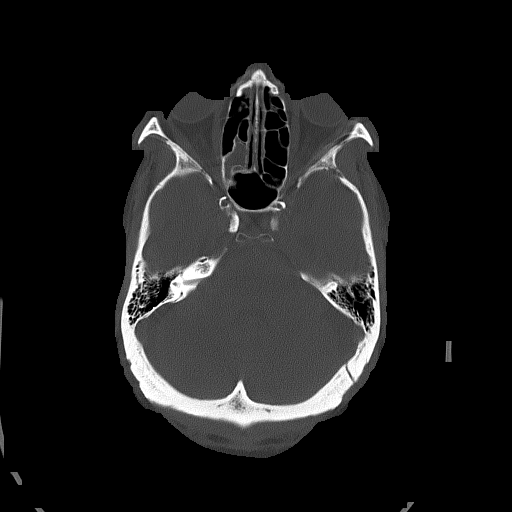

[Series 5: head without cor · coronal · non-contrast · 0.36mm/px · 3 of 73 slices shown]
[im 25/73  brain]
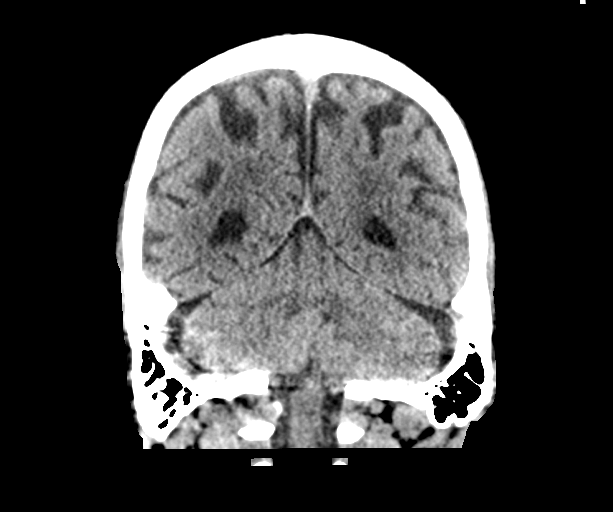
[im 33/73  brain]
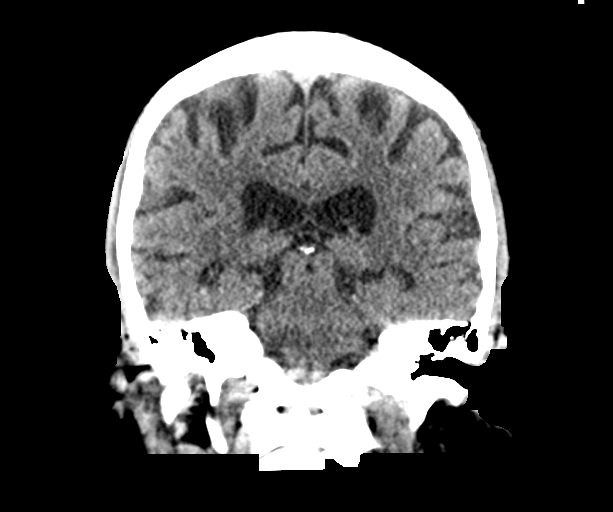
[im 41/73  brain]
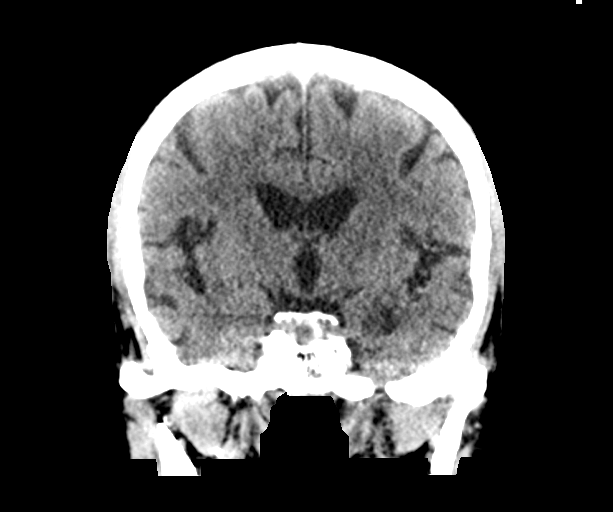

[Series 6: head without sag · sagittal · non-contrast · 0.34mm/px · 3 of 67 slices shown]
[im 23/67  brain]
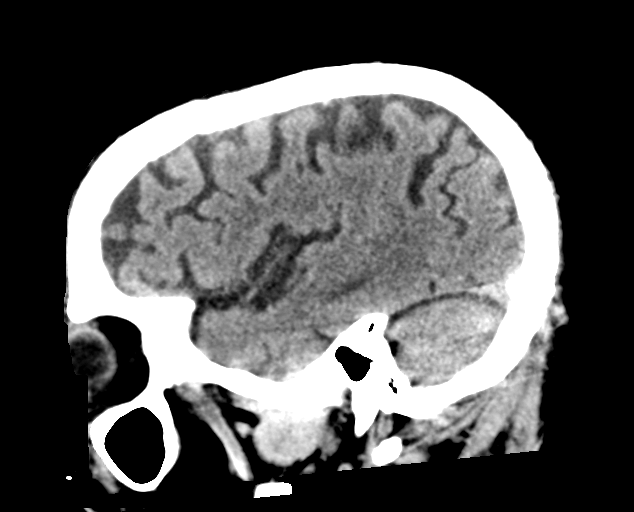
[im 34/67  brain]
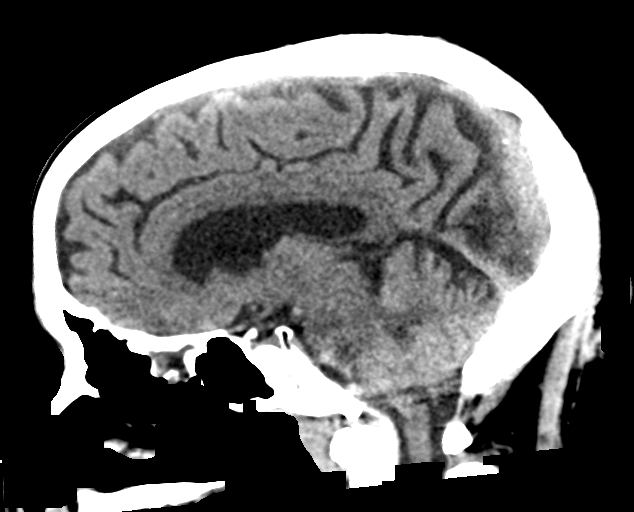
[im 45/67  brain]
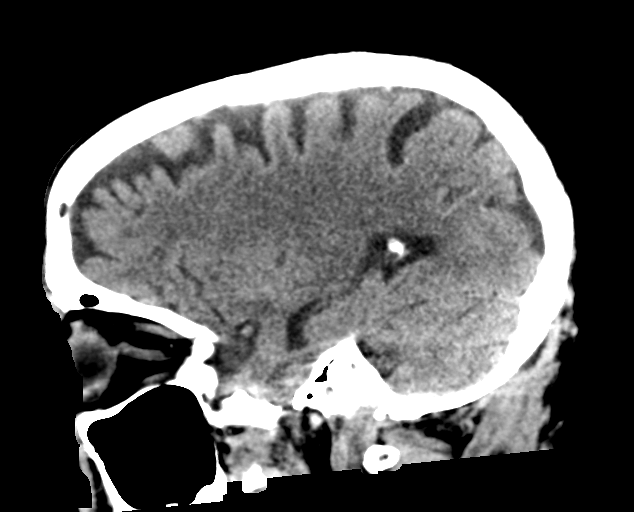

[16 of 47 positions shown; findings below may reference images not displayed]

FINDINGS: Brain: No evidence of acute infarction, hemorrhage, hydrocephalus,
extra-axial collection or mass lesion/mass effect.

Subcortical white matter and periventricular small vessel ischemic
changes.

Vascular: Intracranial atherosclerosis.

Skull: Normal. Negative for fracture or focal lesion.

Sinuses/Orbits: Partial opacification of the right ethmoid sinus.
Visualized paranasal sinuses and mastoid air cells are otherwise
clear.

Other: None.
IMPRESSION: No evidence of acute intracranial abnormality. Small vessel ischemic
changes.

## 2021-08-22 NOTE — ED Provider Notes (Addendum)
Emergency Medicine Provider Triage Evaluation Note  Erik Moss , a 87 y.o. male  was evaluated in triage.  Pt complains of nothing. His daughter reports that when she left at noon this morning she felt like he was okay, however since coming home she is noted that he has had a hard time walking.  She says this is worse than his usual. She tells me "I am having surgery and did not have to be in the hospital for 3 days so he is just going to have to stay there until I can come home after my surgery."    She denies any slurred speech.  No fall or known injury.  History of dementia previous strokes.  Review of Systems  Positive: Difficulty walking Negative: syncope  Physical Exam  BP 123/88   Pulse (!) 103   Temp 98.6 F (37 C) (Oral)   Resp 17   Ht 5\' 6"  (1.676 m)   Wt 70 kg   SpO2 100%   BMI 24.91 kg/m  Gen:   Awake, no distress   Resp:  Normal effort  MSK:   Moves extremities without difficulty, lifts legs bilaterally without difficulty.  No pronator drift.  Face without droop or obvious weakness. Other:  Speech is not slurred.  He is able to answer questions.  Medical Decision Making  Medically screening exam initiated at 10:32 PM.  Appropriate orders placed.  [place] was informed that the remainder of the evaluation will be completed by another provider, this initial triage assessment does not replace that evaluation, and the importance of remaining in the ED until their evaluation is complete.  Note: Portions of this report may have been transcribed using voice recognition software. Every effort was made to ensure accuracy; however, inadvertent computerized transcription errors may be present    Lorin Glass, PA-C 08/22/21 2239  Patient does not appear to meet code stroke criteria at this time.  He is outside of a tPA window, and does not meet LVO criteria.    Lorin Glass, PA-C 08/22/21 2240    Davonna Belling, MD 08/22/21 [phone]

## 2021-08-22 NOTE — ED Triage Notes (Signed)
Patient arrived with EMS from home family reported " shuffling gait" this afternoon , denies injury or fall , no neuro deficits , history of dementia/CVA .

## 2021-08-23 ENCOUNTER — Emergency Department (HOSPITAL_COMMUNITY): Payer: Medicare HMO

## 2021-08-23 DIAGNOSIS — G319 Degenerative disease of nervous system, unspecified: Secondary | ICD-10-CM | POA: Diagnosis not present

## 2021-08-23 LAB — URINALYSIS, ROUTINE W REFLEX MICROSCOPIC
Bilirubin Urine: NEGATIVE
Glucose, UA: NEGATIVE mg/dL
Hgb urine dipstick: NEGATIVE
Ketones, ur: 5 mg/dL — AB
Leukocytes,Ua: NEGATIVE
Nitrite: NEGATIVE
Protein, ur: 30 mg/dL — AB
Specific Gravity, Urine: 1.021 (ref 1.005–1.030)
pH: 5 (ref 5.0–8.0)

## 2021-08-23 LAB — RAPID URINE DRUG SCREEN, HOSP PERFORMED
Amphetamines: NOT DETECTED
Barbiturates: NOT DETECTED
Benzodiazepines: NOT DETECTED
Cocaine: NOT DETECTED
Opiates: NOT DETECTED
Tetrahydrocannabinol: NOT DETECTED

## 2021-08-23 LAB — RESP PANEL BY RT-PCR (FLU A&B, COVID) ARPGX2
Influenza A by PCR: POSITIVE — AB
Influenza B by PCR: NEGATIVE
SARS Coronavirus 2 by RT PCR: NEGATIVE

## 2021-08-23 NOTE — ED Notes (Signed)
Patient transported to MRI 

## 2021-08-23 NOTE — ED Notes (Signed)
Pt located by sort staff, chart placed back in waiting.

## 2021-08-23 NOTE — ED Provider Notes (Signed)
I personally evaluated the patient during the encounter and completed a history, physical, procedures, medical decision making to contribute to the overall care of the patient and decision making for the patient briefly, the patient is a 87 y.o. male for evaluation of gait issue.  Patient with unremarkable vitals.  No fever.  Per triage note family concerned about how he is walking.  Supposed walk with a walker.  Does have history of dementia.  Patient overall denies any symptoms.  No cough or sputum production.  Lab work and images done prior to my evaluation.  He is positive for flu a but denies any myalgias or arthralgias.  He has a normal neurological exam.  He does have a short shuffling gait.  Creatinine basically at baseline.  Head CT with no evidence of acute issues.  There is no hydrocephalus.  We will get an MRI to rule out stroke although have low clinical suspicion.  This could be a progression of his dementia with some Parkinson type symptoms.  He does not have a resting tremor on exam.  Assuming MRI to be unremarkable will recommend outpatient follow-up with neurologist/primary care doctor.  Please see PA note for further results, evaluation, disposition of the patient.  This chart was dictated using voice recognition software.  Despite best efforts to proofread,  errors can occur which can change the documentation meaning.    EKG Interpretation  Date/Time:  Sunday August 22 2021 22:51:15 EDT Ventricular Rate:  98 PR Interval:  234 QRS Duration: 76 QT Interval:  310 QTC Calculation: 395 R Axis:   -64 Text Interpretation: Sinus rhythm with 1st degree A-V block Left axis deviation Abnormal ECG Confirmed by Ronnald Nian, Boysie Bonebrake ([id number]) on 08/23/2021 8:21:26 AM            Lennice Sites, DO 08/23/21 2585

## 2021-08-23 NOTE — ED Provider Notes (Signed)
[facility] EMERGENCY DEPARTMENT Provider Note   CSN: [id number] Arrival date & time: 08/22/21  2023     History Chief Complaint  Patient presents with   Gait Problems    Erik Moss is a 87 y.o. male with past medical history of diabetes, hypertension, hyperlipidemia, renal insufficiency, previous stroke/TIA, mild dementia who was brought in by his daughter for apparent shuffling gait.  Patient states that he is not sure why he is here except that his daughter noticed something with his walking.  He states he is feeling well and has no complaints.  Notably patient's respiratory panel is positive for influenza.  He states that he has not had any nausea, vomiting, diarrhea, fevers, chills, cough or shortness of breath.  He states that he had his flu shot a couple months ago.  The history is provided by medical records and the patient. No language interpreter was used.      Past Medical History:  Diagnosis Date   BPH (benign prostatic hyperplasia) 10/08/2019   Diabetes mellitus without complication ([place])    Hyperlipidemia 10/08/2019   Hypertension 10/08/2019   Renal disease    Stroke [facility])    TIA (transient ischemic attack)    Type 2 diabetes mellitus ([place]) 10/08/2019    Patient Active Problem List   Diagnosis Date Noted   Dementia without behavioral disturbance ([place]) 02/23/2021   Cerebrovascular accident (CVA) ([place]) 02/23/2021   Hyperglycemia due to diabetes mellitus ([place]) 09/24/2020   Acute CVA (cerebrovascular accident) ([place]) 10/08/2019   Type 2 diabetes mellitus ([place]) 10/08/2019   Hyperlipidemia 10/08/2019   BPH (benign prostatic hyperplasia) 10/08/2019   Hypertension 10/08/2019   ARF (acute renal failure) ([place]) 05/08/2019    Past Surgical History:  Procedure Laterality Date   BUBBLE STUDY  10/10/2019   Procedure: BUBBLE STUDY;  Surgeon: Geralynn Rile, MD;  Location: [facility];  Service: Cardiology;;   TEE WITHOUT CARDIOVERSION  N/A 10/10/2019   Procedure: TRANSESOPHAGEAL ECHOCARDIOGRAM (TEE);  Surgeon: Geralynn Rile, MD;  Location: [facility];  Service: Cardiology;  Laterality: N/A;       Family History  Problem Relation Age of Onset   Other Mother        Passed from "old age"   Other Father        Accident    Social History   Tobacco Use   Smoking status: Never   Smokeless tobacco: Never  Substance Use Topics   Alcohol use: Never   Drug use: Never    Home Medications Prior to Admission medications   Medication Sig Start Date End Date Taking? Authorizing Provider  amLODipine (NORVASC) 5 MG tablet Take 5 mg by mouth daily.    [provider]  aspirin EC 81 MG tablet Take 81 mg by mouth daily. Swallow whole.    [provider]  atorvastatin (LIPITOR) 40 MG tablet Take 1 tablet (40 mg total) by mouth daily. 10/10/19 09/24/20  [place] [name], DO  blood glucose meter kit and supplies Dispense based on patient and insurance preference. Use up to four times daily as directed. (FOR ICD-10 E10.9, E11.9). 09/25/20   [name], MD  Cholecalciferol (D3 PO) Take 1 capsule by mouth daily.    [provider]  clopidogrel (PLAVIX) 75 MG tablet Take 75 mg by mouth daily. 11/05/19   [provider]  diphenhydrAMINE (BENADRYL) 12.5 MG/5ML elixir Take 6.25 mg by mouth 4 (four) times daily as needed for allergies.    [provider]  docusate sodium (COLACE) 100 MG capsule Take 100 mg by mouth daily.    [provider]  glimepiride (AMARYL) 2 MG tablet Take 2 mg by mouth in the morning and at bedtime.    [provider]  memantine (NAMENDA) 10 MG tablet Take 1 tablet (10 mg total) by mouth 2 (two) times daily. 02/23/21   [name], MD  mirabegron ER (MYRBETRIQ) 50 MG TB24 tablet Take 50 mg by mouth daily.    [provider]  Multiple Vitamin (MULTIVITAMIN WITH MINERALS) TABS tablet Take 1 tablet by mouth daily.    [provider]   sitaGLIPtin (JANUVIA) 25 MG tablet Take 25 mg by mouth daily.    [provider]  sodium bicarbonate 650 MG tablet Take 650 mg by mouth 2 (two) times daily.    [provider]  tamsulosin (FLOMAX) 0.4 MG CAPS capsule Take 0.4 mg by mouth at bedtime.     [provider]  triamcinolone ointment (KENALOG) 0.1 % Apply 1 application topically daily as needed (itching on lower legs.).     [provider]    Allergies    Patient has no known allergies.  Review of Systems   Review of Systems Ten systems reviewed and are negative for acute change, except as noted in the HPI.   Physical Exam Updated Vital Signs BP (!) 147/72 (BP Location: Right Arm)   Pulse 93   Temp 98.3 F (36.8 C) (Oral)   Resp 15   Ht _0  (1.676 m)   Wt 70 kg   SpO2 99%   BMI 24.91 kg/m   Physical Exam Vitals and nursing note reviewed.  Constitutional:      General: He is not in acute distress.    Appearance: He is well-developed. He is not diaphoretic.  HENT:     Head: Normocephalic and atraumatic.     Mouth/Throat:     Mouth: Mucous membranes are moist.  Eyes:     General: No scleral icterus.    Extraocular Movements: Extraocular movements intact.     Conjunctiva/sclera: Conjunctivae normal.     Pupils: Pupils are equal, round, and reactive to light.  Cardiovascular:     Rate and Rhythm: Normal rate and regular rhythm.     Heart sounds: Normal heart sounds.  Pulmonary:     Effort: Pulmonary effort is normal. No respiratory distress.     Breath sounds: Normal breath sounds.  Abdominal:     General: Bowel sounds are normal. There is no distension.     Palpations: Abdomen is soft.     Tenderness: There is no abdominal tenderness.  Musculoskeletal:     Cervical back: Normal range of motion and neck supple.     Right lower leg: No edema.     Left lower leg: No edema.  Skin:    General: Skin is warm and dry.  Neurological:     Mental Status: He is alert and  oriented to person, place, and time. Mental status is at baseline.     GCS: GCS eye subscore is 4. GCS verbal subscore is 5. GCS motor subscore is 6.     Cranial Nerves: Cranial nerves 2-12 are intact.     Sensory: Sensation is intact.     Motor: Motor function is intact.     Coordination: Finger-Nose-Finger Test normal.     Comments: Patient gait is shuffling but he is able to ambulate around the room unassisted. Patient has  very sligh imbalance with turning, but turns carefully without assistance  Psychiatric:        Behavior: Behavior normal.    ED Results / Procedures / Treatments   Labs (all labs ordered are listed, but only abnormal results are displayed) Labs Reviewed  RESP PANEL BY RT-PCR (FLU A&B, COVID) ARPGX2 - Abnormal; Notable for the following components:      Result Value   Influenza A by PCR POSITIVE (*)    All other components within normal limits  CBC - Abnormal; Notable for the following components:   RBC 3.65 (*)    Hemoglobin 11.4 (*)    HCT 34.9 (*)    All other components within normal limits  COMPREHENSIVE METABOLIC PANEL - Abnormal; Notable for the following components:   CO2 19 (*)    BUN 41 (*)    Creatinine, Ser 2.78 (*)    GFR, Estimated 21 (*)    All other components within normal limits  URINALYSIS, ROUTINE W REFLEX MICROSCOPIC - Abnormal; Notable for the following components:   APPearance HAZY (*)    Ketones, ur 5 (*)    Protein, ur 30 (*)    Bacteria, UA RARE (*)    All other components within normal limits  I-STAT CHEM 8, ED - Abnormal; Notable for the following components:   BUN 45 (*)    Creatinine, Ser 3.00 (*)    Calcium, Ion 1.11 (*)    TCO2 21 (*)    Hemoglobin 11.6 (*)    HCT 34.0 (*)    All other components within normal limits  ETHANOL  PROTIME-INR  APTT  DIFFERENTIAL  RAPID URINE DRUG SCREEN, HOSP PERFORMED    EKG EKG Interpretation  Date/Time:  Sunday August 22 2021 22:51:15 EDT Ventricular Rate:  98 PR  Interval:  234 QRS Duration: 76 QT Interval:  310 QTC Calculation: 395 R Axis:   -64 Text Interpretation: Sinus rhythm with 1st degree A-V block Left axis deviation Abnormal ECG Confirmed by Lennice Sites ([id number]) on 08/23/2021 8:21:26 AM  Radiology CT HEAD WO CONTRAST  Result Date: 08/22/2021 CLINICAL DATA:  TIA, weakness, unsteady gait EXAM: CT HEAD WITHOUT CONTRAST TECHNIQUE: Contiguous axial images were obtained from the base of the skull through the vertex without intravenous contrast. COMPARISON:  03/20/2021 FINDINGS: Brain: No evidence of acute infarction, hemorrhage, hydrocephalus, extra-axial collection or mass lesion/mass effect. Subcortical white matter and periventricular small vessel ischemic changes. Vascular: Intracranial atherosclerosis. Skull: Normal. Negative for fracture or focal lesion. Sinuses/Orbits: Partial opacification of the right ethmoid sinus. Visualized paranasal sinuses and mastoid air cells are otherwise clear. Other: None. IMPRESSION: No evidence of acute intracranial abnormality. Small vessel ischemic changes. Electronically Signed   By: Julian Hy M.D.   On: 08/22/2021 23:41   MR BRAIN WO CONTRAST  Result Date: 08/23/2021 CLINICAL DATA:  Neuro deficit, acute, stroke suspected EXAM: MRI HEAD WITHOUT CONTRAST TECHNIQUE: Multiplanar, multiecho pulse sequences of the brain and surrounding structures were obtained without intravenous contrast. COMPARISON:  CT head August 22, 2021.  MRI January 26, 2021. FINDINGS: Brain: No acute infarction, hemorrhage, hydrocephalus, extra-axial collection or mass lesion. Similar patchy T2 hyperintensities in the supratentorial and pontine white matter, which are nonspecific but compatible with mild-to-moderate chronic microvascular ischemic disease. Similar remote lacunar infarcts in the right thalamus and right subinsular white matter. Similar atrophy. Vascular: Major arterial flow voids are maintained at the skull base. Skull and  upper cervical spine: Normal marrow signal. Sinuses/Orbits: Opacified right posterior ethmoid air cells.  Otherwise, mild paranasal sinus mucosal thickening. Unremarkable orbits. Other: No sizable mastoid effusions. IMPRESSION: 1. No evidence of acute intracranial abnormality, including acute infarct. 2. Similar mild-to-moderate chronic microvascular disease and remote lacunar infarcts. Electronically Signed   By: Margaretha Sheffield M.D.   On: 08/23/2021 10:17    Procedures Procedures   Medications Ordered in ED Medications - No data to display  ED Course  I have reviewed the triage vital signs and the nursing notes.  Pertinent labs & imaging results that were available during my care of the patient were reviewed by me and considered in my medical decision making (see chart for details).    MDM Rules/Calculators/A&P                         87 year old male who was sent in to the emergency department for abnormal gait.  Patient does not have altered mental status and is a fairly good historian despite noted history of dementia. I ordered and reviewed labs that include CBC which shows mild anemia of insignificant value, CMP shows baseline renal insufficiency urine without evidence of infection, UDS negative, PT/INR and APTT within normal limits, respiratory panel positive for influenza however patient is asymptomatic. I ordered and reviewed a CT head which shows no abnormalities I also reviewed images of an MRI brain which shows no evidence of acute infarct or other significant abnormalities.  EKG shows sinus rhythm at a rate of 98.  10:32 AM  I have attempted to contact the patient's daughter and legal guardian several times however have not been able to get her to pick the phone up.  I have also texted her to let her know about her father as he is currently ready for discharge. Final Clinical Impression(s) / ED Diagnoses Final diagnoses:  None    Rx / DC Orders ED Discharge Orders      None        Margarita Mail, PA-C 08/23/21 [place], [place], DO 08/23/21 [id number]

## 2021-08-23 NOTE — ED Notes (Signed)
Discharge instructions reviewed with patient . Patient verbalized understanding of instructions. Follow-up care was reviewed. Patient ambulatory with steady gait. VSS upon discharge.

## 2021-08-23 NOTE — ED Notes (Signed)
Pt was called multiple times and did not respond

## 2021-08-23 NOTE — Discharge Instructions (Signed)
1) Your flu test came back positive, however you are not having symptoms and there is noting to do at this time. 2) Be sure to use your walker to maintain your balance is much as possible at home.  This may actually help you get more at exercise and increase your leg strength.  Follow-up with [name] for evaluation of your shuffling gait.  Contact a health care provider if: You develop new symptoms. You have: Chest pain. Diarrhea. A fever. Your cough gets worse. You produce more mucus. You feel nauseous or you vomit. Get help right away if you: Develop shortness of breath or have difficulty breathing. Have skin or nails that turn a bluish color. Have severe pain or stiffness in your neck. Develop a sudden headache or sudden pain in your face or ear. Cannot eat or drink without vomiting.

## 2021-09-07 ENCOUNTER — Ambulatory Visit: Payer: Medicare HMO | Admitting: Neurology

## 2021-09-29 DIAGNOSIS — N1832 Chronic kidney disease, stage 3b: Secondary | ICD-10-CM | POA: Diagnosis not present

## 2021-10-04 DIAGNOSIS — E872 Acidosis, unspecified: Secondary | ICD-10-CM | POA: Diagnosis not present

## 2021-10-04 DIAGNOSIS — Z23 Encounter for immunization: Secondary | ICD-10-CM | POA: Diagnosis not present

## 2021-10-04 DIAGNOSIS — N1832 Chronic kidney disease, stage 3b: Secondary | ICD-10-CM | POA: Diagnosis not present

## 2021-10-04 DIAGNOSIS — N400 Enlarged prostate without lower urinary tract symptoms: Secondary | ICD-10-CM | POA: Diagnosis not present

## 2021-10-04 DIAGNOSIS — E1122 Type 2 diabetes mellitus with diabetic chronic kidney disease: Secondary | ICD-10-CM | POA: Diagnosis not present

## 2021-10-04 DIAGNOSIS — I129 Hypertensive chronic kidney disease with stage 1 through stage 4 chronic kidney disease, or unspecified chronic kidney disease: Secondary | ICD-10-CM | POA: Diagnosis not present

## 2021-10-04 DIAGNOSIS — R69 Illness, unspecified: Secondary | ICD-10-CM | POA: Diagnosis not present

## 2021-10-04 DIAGNOSIS — D631 Anemia in chronic kidney disease: Secondary | ICD-10-CM | POA: Diagnosis not present

## 2021-10-04 DIAGNOSIS — E875 Hyperkalemia: Secondary | ICD-10-CM | POA: Diagnosis not present

## 2021-10-04 DIAGNOSIS — N281 Cyst of kidney, acquired: Secondary | ICD-10-CM | POA: Diagnosis not present

## 2021-10-28 DIAGNOSIS — Z0000 Encounter for general adult medical examination without abnormal findings: Secondary | ICD-10-CM | POA: Diagnosis not present

## 2021-10-28 DIAGNOSIS — I10 Essential (primary) hypertension: Secondary | ICD-10-CM | POA: Diagnosis not present

## 2021-10-28 DIAGNOSIS — E782 Mixed hyperlipidemia: Secondary | ICD-10-CM | POA: Diagnosis not present

## 2021-10-28 DIAGNOSIS — N400 Enlarged prostate without lower urinary tract symptoms: Secondary | ICD-10-CM | POA: Diagnosis not present

## 2021-10-28 DIAGNOSIS — N184 Chronic kidney disease, stage 4 (severe): Secondary | ICD-10-CM | POA: Diagnosis not present

## 2021-10-28 DIAGNOSIS — R809 Proteinuria, unspecified: Secondary | ICD-10-CM | POA: Diagnosis not present

## 2021-10-28 DIAGNOSIS — Z125 Encounter for screening for malignant neoplasm of prostate: Secondary | ICD-10-CM | POA: Diagnosis not present

## 2021-10-28 DIAGNOSIS — E559 Vitamin D deficiency, unspecified: Secondary | ICD-10-CM | POA: Diagnosis not present

## 2021-10-28 DIAGNOSIS — R69 Illness, unspecified: Secondary | ICD-10-CM | POA: Diagnosis not present

## 2021-10-28 DIAGNOSIS — E1169 Type 2 diabetes mellitus with other specified complication: Secondary | ICD-10-CM | POA: Diagnosis not present

## 2021-10-29 DIAGNOSIS — I10 Essential (primary) hypertension: Secondary | ICD-10-CM | POA: Diagnosis not present

## 2021-10-29 DIAGNOSIS — N184 Chronic kidney disease, stage 4 (severe): Secondary | ICD-10-CM | POA: Diagnosis not present

## 2021-10-29 DIAGNOSIS — E1169 Type 2 diabetes mellitus with other specified complication: Secondary | ICD-10-CM | POA: Diagnosis not present

## 2021-10-29 DIAGNOSIS — E559 Vitamin D deficiency, unspecified: Secondary | ICD-10-CM | POA: Diagnosis not present

## 2021-10-29 DIAGNOSIS — E782 Mixed hyperlipidemia: Secondary | ICD-10-CM | POA: Diagnosis not present

## 2021-10-29 DIAGNOSIS — Z125 Encounter for screening for malignant neoplasm of prostate: Secondary | ICD-10-CM | POA: Diagnosis not present

## 2021-10-29 DIAGNOSIS — N400 Enlarged prostate without lower urinary tract symptoms: Secondary | ICD-10-CM | POA: Diagnosis not present

## 2021-11-30 ENCOUNTER — Telehealth: Payer: Self-pay | Admitting: Neurology

## 2021-11-30 ENCOUNTER — Ambulatory Visit: Payer: Medicare HMO | Admitting: Neurology

## 2021-11-30 ENCOUNTER — Encounter: Payer: Self-pay | Admitting: Neurology

## 2021-11-30 VITALS — BP 101/60 | HR 92 | Ht 66.0 in | Wt 144.5 lb

## 2021-11-30 DIAGNOSIS — R269 Unspecified abnormalities of gait and mobility: Secondary | ICD-10-CM | POA: Diagnosis not present

## 2021-11-30 DIAGNOSIS — I639 Cerebral infarction, unspecified: Secondary | ICD-10-CM | POA: Diagnosis not present

## 2021-11-30 DIAGNOSIS — F0390 Unspecified dementia without behavioral disturbance: Secondary | ICD-10-CM | POA: Diagnosis not present

## 2021-11-30 DIAGNOSIS — R32 Unspecified urinary incontinence: Secondary | ICD-10-CM

## 2021-11-30 DIAGNOSIS — R69 Illness, unspecified: Secondary | ICD-10-CM | POA: Diagnosis not present

## 2021-11-30 MED ORDER — MEMANTINE HCL 10 MG PO TABS: 10.0000 mg | ORAL_TABLET | Freq: Two times a day (BID) | ORAL | 4 refills | Status: DC

## 2021-11-30 NOTE — Telephone Encounter (Signed)
Marjory Lies with [facility]  is able to take the patient, but he was wondering if he can get a verbal order to do OT with the aide?

## 2021-11-30 NOTE — Telephone Encounter (Signed)
I called Marjory Lies at [facility] and gave VO for OT.

## 2021-11-30 NOTE — Progress Notes (Signed)
Chief Complaint  Patient presents with   Follow-up    Room 15 with daughter, [name]. MoCA 18/30. Dementia follow up. Feels his memory is stable.      ASSESSMENT AND PLAN  Erik Moss is a 88 y.o. male   History of stroke involving right posterior limb of internal capsule in December 2020  Vascular risk factor of aging, hypertension, hyperlipidemia, diabetes  From stroke standpoint, he does not need dual antiplatelet agent, suggesting take Plavix 75 mg alone  Dementia  MoCA examination 18 /30 in February 2023  Laboratory evaluation showed no treatable etiology  Continue moderate exercise  Add on Namenda 10 mg twice a day, keep Aricept 10 mg daily  Worsening gait abnormality, incontinence,  Hyperreflexia on examinations,  MRI of cervical spine to rule out cervical spondylitic myelopathy  Refer to home physical therapy  DIAGNOSTIC DATA (LABS, IMAGING, TESTING) - I reviewed patient records, labs, notes, testing and imaging myself where available.  MRI of the brain without contrast January 26, 2021: No acute abnormality, generalized atrophy, small vessel disease  MRA of neck and head December 2020, no significant large vessel disease  MRI of the brain December 2020, small acute infarction involving right lateral thalamus, posterior limb of internal capsule  Laboratory evaluations, in December 2021, 1.71, glucose 214, A1C 10.5, CBC Hg 10.5,   Left Ventricle: Left ventricular ejection fraction, by visual estimation, is 60 to 65%. The left ventricle has normal function. The left ventricle has no regional wall motion abnormalities. There is no left ventricular hypertrophy. Right Ventricle: The right ventricular size is normal. No increase in right ventricular wall thickness. Global RV systolic function is has normal systolic function. Left Atrium: Left atrial size was normal in size. No LAA thrombus. LAA emptying velocity 91 cm/s. Right Atrium: Right atrial size was normal in  size Pericardium: Trivial pericardial effusion is present. Mitral Valve: The mitral valve is grossly normal. Mild mitral valve regurgitation. There is no evidence of mitral valve vegetation. Tricuspid Valve: The tricuspid valve is grossly normal. Tricuspid valve regurgitation is mild. Aortic Valve: The aortic valve is tricuspid. Aortic valve regurgitation is not visualized. The aortic valve is structurally normal, with no evidence of sclerosis or stenosis. Pulmonic Valve: The pulmonic valve was grossly normal. Pulmonic valve regurgitation is trivial. Aorta: The aortic root and ascending aorta are structurally normal, with no evidence of dilitation. There is severe, protruding plaque involving the aortic root. There is protruding calcified atheroma in the aortic root (best seen image 42). It is mass-like and measures 0.7 cm x 0.5 cm. This was likely seen in and out of plane on transthoracic imaging around the aortic valve. There is no mass or vegetation attached to the aortic valve. This represents severe (Grade 4) atheroma in the aortic root. There is moderate (Grade 3) atheroma in the descending aorta. Venous: The left upper pulmonary vein is and the right upper pulmonary vein is normal.   HISTORICAL  Erik Moss is a 88 year old male, seen in request by his primary care nurse practitioner brake, Beryle Lathe for evaluation of transient confusion, he is accompanied by his daughter [name] at today's visit Feb 23, 2021  I reviewed and summarized the referring note.  Past medical history Hypertension Hyperlipidemia Diabetes Prostate hypertrophy  He is retired [employer], he retired at age 46s, since then, he completed total of 9 masters degree, Writer, [place] history, creative writing English, Air cabin crew, international Insurance claims handler,  Prior to 2020, he  lives alone, moved to his daughter during pandemic, was noted to have slow worsening memory loss,    He was admitted to hospital in December 2020, was noted to have acute onset left-sided weakness, dragging his left leg while walking  MRI of the brain on October 08, 2019 showed small region of acute infarction involving right lateral thalamus, posterior limb of internal capsule, was also noted to have poorly controlled diabetes, A1c up to 10.5,  Extensive evaluation showed no significant large vessel disease, no significant abnormality on echocardiogram,  He was started on Plavix 75 mg daily, also taking aspirin 81 mg daily, daughter began to help in managing his medications   He recovered almost back to his baseline, he wished to register for college class, but was denied, he was noted to have worsening memory loss, he still enjoying reading newspaper, walk few miles each day,  His mother lived to more than 10 years old, began to develop mild memory loss in her 79s  In January 26, 2021, woke up in the morning, he was noted by his daughter to be slow reaction, mildly slurred speech, confused, was taken to the emergency room, MRI of the brain showed no acute abnormality  UPDATE Nov 30 2021: He lives with his daughter now, still likes to read newspaper, also exercise regularly, his memory continues to decline, MoCA examination is 18/30 today, daughter also reported that he has increased gait abnormality, occasionally bowel and bladder incontinence.  Personally reviewed MRI of brain on Aug 23 2021:1. No evidence of acute intracranial abnormality, including acute infarct.2. Similar mild-to-moderate chronic microvascular disease and remote lacunar infarcts. He is tolerating Aricept 10 mg daily Laboratory evaluations October 2022: CBC showed hemoglobin of 11.4, CMP, creatinine 2.78, negative alcohol, negative UDS  REVIEW OF SYSTEMS:  Full 14 system review of systems performed and notable only for as above All other review of systems were negative.  PHYSICAL EXAM:   Vitals:   11/30/21 0721   BP: 101/60  Pulse: 92  Weight: 144 lb 8 oz (65.5 kg)  Height: '5\' 6"'  (1.676 m)   Not recorded     Body mass index is 23.32 kg/m.  PHYSICAL EXAMNIATION:  Gen: NAD, conversant, well nourised, well groomed                     Cardiovascular: Regular rate rhythm, no peripheral edema, warm, nontender. Eyes: Conjunctivae clear without exudates or hemorrhage Neck: Supple, no carotid bruits. Pulmonary: Clear to auscultation bilaterally   NEUROLOGICAL EXAM:  MENTAL STATUS: Speech:    Speech is normal; fluent and spontaneous with normal comprehension.   Montreal Cognitive Assessment  11/30/2021 02/25/2021  Visuospatial/ Executive (0/5) 3 1  Naming (0/3) 1 3  Attention: Read list of digits (0/2) 2 2  Attention: Read list of letters (0/1) 1 1  Attention: Serial 7 subtraction starting at 100 (0/3) 3 3  Language: Repeat phrase (0/2) 1 2  Language : Fluency (0/1) 1 1  Abstraction (0/2) 1 2  Delayed Recall (0/5) 0 0  Orientation (0/6) 5 6  Total 18 21  Adjusted Score (based on education) 18 -     CRANIAL NERVES: CN II: Visual fields are full to confrontation. Pupils are round equal and briskly reactive to light. CN III, IV, VI: extraocular movement are normal. No ptosis. CN V: Facial sensation is intact to light touch CN VII: Face is symmetric with normal eye closure  CN VIII: Hearing is normal to causal conversation. CN  IX, X: Phonation is normal. CN XI: Head turning and shoulder shrug are intact  MOTOR: There is no pronator drift of out-stretched arms. Muscle bulk and tone are normal. Muscle strength is normal.  REFLEXES: Reflexes are 2+ and symmetric at the biceps, triceps, knees, and ankles. Plantar responses are flexor.  SENSORY: Intact to light touch, pinprick and vibratory sensation are intact in fingers and toes.  COORDINATION: There is no trunk or limb dysmetria noted.  GAIT/STANCE: He can get up from seated position arm crossed, slight dragging left leg,  mildly  unsteady Romberg is absent.  ALLERGIES: No Known Allergies  HOME MEDICATIONS: Current Outpatient Medications  Medication Sig Dispense Refill   amLODipine (NORVASC) 5 MG tablet Take 5 mg by mouth daily.     aspirin EC 81 MG tablet Take 81 mg by mouth daily. Swallow whole.     blood glucose meter kit and supplies Dispense based on patient and insurance preference. Use up to four times daily as directed. (FOR ICD-10 E10.9, E11.9). 1 each 0   Cholecalciferol (D3 PO) Take 1 capsule by mouth daily.     clopidogrel (PLAVIX) 75 MG tablet Take 75 mg by mouth daily.     diphenhydrAMINE (BENADRYL) 12.5 MG/5ML elixir Take 6.25 mg by mouth 4 (four) times daily as needed for allergies.     docusate sodium (COLACE) 100 MG capsule Take 100 mg by mouth daily.     glimepiride (AMARYL) 2 MG tablet Take 2 mg by mouth in the morning and at bedtime.     memantine (NAMENDA) 10 MG tablet Take 1 tablet (10 mg total) by mouth 2 (two) times daily. 60 tablet 11   mirabegron ER (MYRBETRIQ) 50 MG TB24 tablet Take 50 mg by mouth daily.     Multiple Vitamin (MULTIVITAMIN WITH MINERALS) TABS tablet Take 1 tablet by mouth daily.     sitaGLIPtin (JANUVIA) 25 MG tablet Take 25 mg by mouth daily.     sodium bicarbonate 650 MG tablet Take 650 mg by mouth 2 (two) times daily.     atorvastatin (LIPITOR) 40 MG tablet Take 1 tablet (40 mg total) by mouth daily. 90 tablet 0   No current facility-administered medications for this visit.    PAST MEDICAL HISTORY: Past Medical History:  Diagnosis Date   BPH (benign prostatic hyperplasia) 10/08/2019   Diabetes mellitus without complication ([place])    Hyperlipidemia 10/08/2019   Hypertension 10/08/2019   Renal disease    Stroke [facility])    TIA (transient ischemic attack)    Type 2 diabetes mellitus ([place]) 10/08/2019    PAST SURGICAL HISTORY: Past Surgical History:  Procedure Laterality Date   BUBBLE STUDY  10/10/2019   Procedure: BUBBLE STUDY;  Surgeon: Geralynn Rile,  MD;  Location: [facility];  Service: Cardiology;;   TEE WITHOUT CARDIOVERSION N/A 10/10/2019   Procedure: TRANSESOPHAGEAL ECHOCARDIOGRAM (TEE);  Surgeon: Geralynn Rile, MD;  Location: [facility];  Service: Cardiology;  Laterality: N/A;    FAMILY HISTORY: Family History  Problem Relation Age of Onset   Other Mother        Passed from "old age"   Other Father        Accident    SOCIAL HISTORY: Social History   Socioeconomic History   Marital status: Divorced    Spouse name: Not on file   Number of children: 1   Years of education: collge   Highest education level: Master's degree (e.g., MA, MS, MEng, MEd, MSW, MBA)  Occupational  History   Occupation: Retired  Tobacco Use   Smoking status: Never   Smokeless tobacco: Never  Substance and Sexual Activity   Alcohol use: Never   Drug use: Never   Sexual activity: Not on file  Other Topics Concern   Not on file  Social History Narrative   11/19/19 lives with dgtr, grand dgtr.   Left-handed.   Reports having nine masters' degrees.   1 cup tea each morning.   Social Determinants of Health   Financial Resource Strain: Not on file  Food Insecurity: Not on file  Transportation Needs: Not on file  Physical Activity: Not on file  Stress: Not on file  Social Connections: Not on file  Intimate Partner Violence: Not on file      [name], M.D. Ph.D.  [facility] [address], [place], [place] [address] Ph: [phone] Fax: [phone]  CC:  [name], [place] [employer],  [place] [address]  [name], FNP

## 2021-11-30 NOTE — Telephone Encounter (Signed)
[employer] medicare order sent to [facility], they will obtain the auth and reach out to the patient to schedule.  

## 2021-11-30 NOTE — Telephone Encounter (Signed)
Ok for OT

## 2021-12-03 DIAGNOSIS — Z961 Presence of intraocular lens: Secondary | ICD-10-CM | POA: Diagnosis not present

## 2021-12-03 DIAGNOSIS — H40013 Open angle with borderline findings, low risk, bilateral: Secondary | ICD-10-CM | POA: Diagnosis not present

## 2021-12-03 DIAGNOSIS — H02831 Dermatochalasis of right upper eyelid: Secondary | ICD-10-CM | POA: Diagnosis not present

## 2021-12-03 DIAGNOSIS — H524 Presbyopia: Secondary | ICD-10-CM | POA: Diagnosis not present

## 2021-12-03 DIAGNOSIS — E119 Type 2 diabetes mellitus without complications: Secondary | ICD-10-CM | POA: Diagnosis not present

## 2021-12-06 DIAGNOSIS — Z7984 Long term (current) use of oral hypoglycemic drugs: Secondary | ICD-10-CM | POA: Diagnosis not present

## 2021-12-06 DIAGNOSIS — E785 Hyperlipidemia, unspecified: Secondary | ICD-10-CM | POA: Diagnosis not present

## 2021-12-06 DIAGNOSIS — I639 Cerebral infarction, unspecified: Secondary | ICD-10-CM | POA: Diagnosis not present

## 2021-12-06 DIAGNOSIS — I69354 Hemiplegia and hemiparesis following cerebral infarction affecting left non-dominant side: Secondary | ICD-10-CM | POA: Diagnosis not present

## 2021-12-06 DIAGNOSIS — I69318 Other symptoms and signs involving cognitive functions following cerebral infarction: Secondary | ICD-10-CM | POA: Diagnosis not present

## 2021-12-06 DIAGNOSIS — N289 Disorder of kidney and ureter, unspecified: Secondary | ICD-10-CM | POA: Diagnosis not present

## 2021-12-06 DIAGNOSIS — I10 Essential (primary) hypertension: Secondary | ICD-10-CM | POA: Diagnosis not present

## 2021-12-06 DIAGNOSIS — N400 Enlarged prostate without lower urinary tract symptoms: Secondary | ICD-10-CM | POA: Diagnosis not present

## 2021-12-06 DIAGNOSIS — Z7982 Long term (current) use of aspirin: Secondary | ICD-10-CM | POA: Diagnosis not present

## 2021-12-06 DIAGNOSIS — R69 Illness, unspecified: Secondary | ICD-10-CM | POA: Diagnosis not present

## 2021-12-06 DIAGNOSIS — E119 Type 2 diabetes mellitus without complications: Secondary | ICD-10-CM | POA: Diagnosis not present

## 2021-12-09 DIAGNOSIS — I69354 Hemiplegia and hemiparesis following cerebral infarction affecting left non-dominant side: Secondary | ICD-10-CM | POA: Diagnosis not present

## 2021-12-09 DIAGNOSIS — I69318 Other symptoms and signs involving cognitive functions following cerebral infarction: Secondary | ICD-10-CM | POA: Diagnosis not present

## 2021-12-09 DIAGNOSIS — E119 Type 2 diabetes mellitus without complications: Secondary | ICD-10-CM | POA: Diagnosis not present

## 2021-12-09 DIAGNOSIS — N400 Enlarged prostate without lower urinary tract symptoms: Secondary | ICD-10-CM | POA: Diagnosis not present

## 2021-12-09 DIAGNOSIS — N289 Disorder of kidney and ureter, unspecified: Secondary | ICD-10-CM | POA: Diagnosis not present

## 2021-12-09 DIAGNOSIS — Z7982 Long term (current) use of aspirin: Secondary | ICD-10-CM | POA: Diagnosis not present

## 2021-12-09 DIAGNOSIS — R69 Illness, unspecified: Secondary | ICD-10-CM | POA: Diagnosis not present

## 2021-12-09 DIAGNOSIS — Z7984 Long term (current) use of oral hypoglycemic drugs: Secondary | ICD-10-CM | POA: Diagnosis not present

## 2021-12-09 DIAGNOSIS — I10 Essential (primary) hypertension: Secondary | ICD-10-CM | POA: Diagnosis not present

## 2021-12-09 DIAGNOSIS — E785 Hyperlipidemia, unspecified: Secondary | ICD-10-CM | POA: Diagnosis not present

## 2021-12-10 DIAGNOSIS — N400 Enlarged prostate without lower urinary tract symptoms: Secondary | ICD-10-CM | POA: Diagnosis not present

## 2021-12-10 DIAGNOSIS — E785 Hyperlipidemia, unspecified: Secondary | ICD-10-CM | POA: Diagnosis not present

## 2021-12-10 DIAGNOSIS — I69318 Other symptoms and signs involving cognitive functions following cerebral infarction: Secondary | ICD-10-CM | POA: Diagnosis not present

## 2021-12-10 DIAGNOSIS — I69354 Hemiplegia and hemiparesis following cerebral infarction affecting left non-dominant side: Secondary | ICD-10-CM | POA: Diagnosis not present

## 2021-12-10 DIAGNOSIS — Z7982 Long term (current) use of aspirin: Secondary | ICD-10-CM | POA: Diagnosis not present

## 2021-12-10 DIAGNOSIS — Z7984 Long term (current) use of oral hypoglycemic drugs: Secondary | ICD-10-CM | POA: Diagnosis not present

## 2021-12-10 DIAGNOSIS — E119 Type 2 diabetes mellitus without complications: Secondary | ICD-10-CM | POA: Diagnosis not present

## 2021-12-10 DIAGNOSIS — I10 Essential (primary) hypertension: Secondary | ICD-10-CM | POA: Diagnosis not present

## 2021-12-10 DIAGNOSIS — N289 Disorder of kidney and ureter, unspecified: Secondary | ICD-10-CM | POA: Diagnosis not present

## 2021-12-10 DIAGNOSIS — R69 Illness, unspecified: Secondary | ICD-10-CM | POA: Diagnosis not present

## 2021-12-11 ENCOUNTER — Other Ambulatory Visit: Payer: Self-pay

## 2021-12-11 ENCOUNTER — Ambulatory Visit
Admission: RE | Admit: 2021-12-11 | Discharge: 2021-12-11 | Disposition: A | Discharge status: Home / Self Care | Payer: Medicare HMO | Source: Ambulatory Visit | Attending: Neurology | Admitting: Neurology

## 2021-12-11 DIAGNOSIS — R32 Unspecified urinary incontinence: Secondary | ICD-10-CM | POA: Diagnosis not present

## 2021-12-11 DIAGNOSIS — F0390 Unspecified dementia without behavioral disturbance: Secondary | ICD-10-CM

## 2021-12-11 DIAGNOSIS — I639 Cerebral infarction, unspecified: Secondary | ICD-10-CM

## 2021-12-11 DIAGNOSIS — R269 Unspecified abnormalities of gait and mobility: Secondary | ICD-10-CM

## 2021-12-13 ENCOUNTER — Telehealth: Payer: Self-pay | Admitting: *Deleted

## 2021-12-13 NOTE — Telephone Encounter (Signed)
-----   Message from Genia Harold, MD sent at 12/13/2021 12:44 PM EST ----- MRI C-spine shows spinal stenosis most prominent at C4-5 with possible nerve root compression on the left. Recommend he continue physical therapy per Dr. Rhea Belton plan, will defer further workup to her.

## 2021-12-13 NOTE — Telephone Encounter (Signed)
Spoke with patient's daughter [name] (as per DPR). Informed of patient results and instructed that pt continue with physical therapy. [name] expressed her gratitude to Dr. [name] for her help. She verbalized understanding of the results. All questions answered. Informed patient Dr. [name] would contact her with any additional recommendations.

## 2021-12-15 DIAGNOSIS — N289 Disorder of kidney and ureter, unspecified: Secondary | ICD-10-CM | POA: Diagnosis not present

## 2021-12-15 DIAGNOSIS — N400 Enlarged prostate without lower urinary tract symptoms: Secondary | ICD-10-CM | POA: Diagnosis not present

## 2021-12-15 DIAGNOSIS — I10 Essential (primary) hypertension: Secondary | ICD-10-CM | POA: Diagnosis not present

## 2021-12-15 DIAGNOSIS — I69354 Hemiplegia and hemiparesis following cerebral infarction affecting left non-dominant side: Secondary | ICD-10-CM | POA: Diagnosis not present

## 2021-12-15 DIAGNOSIS — Z7982 Long term (current) use of aspirin: Secondary | ICD-10-CM | POA: Diagnosis not present

## 2021-12-15 DIAGNOSIS — E785 Hyperlipidemia, unspecified: Secondary | ICD-10-CM | POA: Diagnosis not present

## 2021-12-15 DIAGNOSIS — E119 Type 2 diabetes mellitus without complications: Secondary | ICD-10-CM | POA: Diagnosis not present

## 2021-12-15 DIAGNOSIS — R69 Illness, unspecified: Secondary | ICD-10-CM | POA: Diagnosis not present

## 2021-12-15 DIAGNOSIS — I69318 Other symptoms and signs involving cognitive functions following cerebral infarction: Secondary | ICD-10-CM | POA: Diagnosis not present

## 2021-12-15 DIAGNOSIS — Z7984 Long term (current) use of oral hypoglycemic drugs: Secondary | ICD-10-CM | POA: Diagnosis not present

## 2021-12-21 DIAGNOSIS — I10 Essential (primary) hypertension: Secondary | ICD-10-CM | POA: Diagnosis not present

## 2021-12-21 DIAGNOSIS — I69354 Hemiplegia and hemiparesis following cerebral infarction affecting left non-dominant side: Secondary | ICD-10-CM | POA: Diagnosis not present

## 2021-12-21 DIAGNOSIS — Z7984 Long term (current) use of oral hypoglycemic drugs: Secondary | ICD-10-CM | POA: Diagnosis not present

## 2021-12-21 DIAGNOSIS — E785 Hyperlipidemia, unspecified: Secondary | ICD-10-CM | POA: Diagnosis not present

## 2021-12-21 DIAGNOSIS — I69318 Other symptoms and signs involving cognitive functions following cerebral infarction: Secondary | ICD-10-CM | POA: Diagnosis not present

## 2021-12-21 DIAGNOSIS — Z7982 Long term (current) use of aspirin: Secondary | ICD-10-CM | POA: Diagnosis not present

## 2021-12-21 DIAGNOSIS — E119 Type 2 diabetes mellitus without complications: Secondary | ICD-10-CM | POA: Diagnosis not present

## 2021-12-21 DIAGNOSIS — N289 Disorder of kidney and ureter, unspecified: Secondary | ICD-10-CM | POA: Diagnosis not present

## 2021-12-21 DIAGNOSIS — R69 Illness, unspecified: Secondary | ICD-10-CM | POA: Diagnosis not present

## 2021-12-21 DIAGNOSIS — N400 Enlarged prostate without lower urinary tract symptoms: Secondary | ICD-10-CM | POA: Diagnosis not present

## 2021-12-22 DIAGNOSIS — I10 Essential (primary) hypertension: Secondary | ICD-10-CM | POA: Diagnosis not present

## 2021-12-22 DIAGNOSIS — I69318 Other symptoms and signs involving cognitive functions following cerebral infarction: Secondary | ICD-10-CM | POA: Diagnosis not present

## 2021-12-22 DIAGNOSIS — E785 Hyperlipidemia, unspecified: Secondary | ICD-10-CM | POA: Diagnosis not present

## 2021-12-22 DIAGNOSIS — I69354 Hemiplegia and hemiparesis following cerebral infarction affecting left non-dominant side: Secondary | ICD-10-CM | POA: Diagnosis not present

## 2021-12-22 DIAGNOSIS — Z7984 Long term (current) use of oral hypoglycemic drugs: Secondary | ICD-10-CM | POA: Diagnosis not present

## 2021-12-22 DIAGNOSIS — Z7982 Long term (current) use of aspirin: Secondary | ICD-10-CM | POA: Diagnosis not present

## 2021-12-22 DIAGNOSIS — N289 Disorder of kidney and ureter, unspecified: Secondary | ICD-10-CM | POA: Diagnosis not present

## 2021-12-22 DIAGNOSIS — R69 Illness, unspecified: Secondary | ICD-10-CM | POA: Diagnosis not present

## 2021-12-22 DIAGNOSIS — E119 Type 2 diabetes mellitus without complications: Secondary | ICD-10-CM | POA: Diagnosis not present

## 2021-12-22 DIAGNOSIS — N400 Enlarged prostate without lower urinary tract symptoms: Secondary | ICD-10-CM | POA: Diagnosis not present

## 2021-12-28 DIAGNOSIS — Z7984 Long term (current) use of oral hypoglycemic drugs: Secondary | ICD-10-CM | POA: Diagnosis not present

## 2021-12-28 DIAGNOSIS — I69354 Hemiplegia and hemiparesis following cerebral infarction affecting left non-dominant side: Secondary | ICD-10-CM | POA: Diagnosis not present

## 2021-12-28 DIAGNOSIS — I10 Essential (primary) hypertension: Secondary | ICD-10-CM | POA: Diagnosis not present

## 2021-12-28 DIAGNOSIS — Z7982 Long term (current) use of aspirin: Secondary | ICD-10-CM | POA: Diagnosis not present

## 2021-12-28 DIAGNOSIS — R69 Illness, unspecified: Secondary | ICD-10-CM | POA: Diagnosis not present

## 2021-12-28 DIAGNOSIS — E119 Type 2 diabetes mellitus without complications: Secondary | ICD-10-CM | POA: Diagnosis not present

## 2021-12-28 DIAGNOSIS — N289 Disorder of kidney and ureter, unspecified: Secondary | ICD-10-CM | POA: Diagnosis not present

## 2021-12-28 DIAGNOSIS — N400 Enlarged prostate without lower urinary tract symptoms: Secondary | ICD-10-CM | POA: Diagnosis not present

## 2021-12-28 DIAGNOSIS — E785 Hyperlipidemia, unspecified: Secondary | ICD-10-CM | POA: Diagnosis not present

## 2021-12-28 DIAGNOSIS — I69318 Other symptoms and signs involving cognitive functions following cerebral infarction: Secondary | ICD-10-CM | POA: Diagnosis not present

## 2021-12-30 DIAGNOSIS — I69354 Hemiplegia and hemiparesis following cerebral infarction affecting left non-dominant side: Secondary | ICD-10-CM | POA: Diagnosis not present

## 2021-12-30 DIAGNOSIS — N400 Enlarged prostate without lower urinary tract symptoms: Secondary | ICD-10-CM | POA: Diagnosis not present

## 2021-12-30 DIAGNOSIS — N289 Disorder of kidney and ureter, unspecified: Secondary | ICD-10-CM | POA: Diagnosis not present

## 2021-12-30 DIAGNOSIS — E119 Type 2 diabetes mellitus without complications: Secondary | ICD-10-CM | POA: Diagnosis not present

## 2021-12-30 DIAGNOSIS — I69318 Other symptoms and signs involving cognitive functions following cerebral infarction: Secondary | ICD-10-CM | POA: Diagnosis not present

## 2021-12-30 DIAGNOSIS — Z7982 Long term (current) use of aspirin: Secondary | ICD-10-CM | POA: Diagnosis not present

## 2021-12-30 DIAGNOSIS — E785 Hyperlipidemia, unspecified: Secondary | ICD-10-CM | POA: Diagnosis not present

## 2021-12-30 DIAGNOSIS — R69 Illness, unspecified: Secondary | ICD-10-CM | POA: Diagnosis not present

## 2021-12-30 DIAGNOSIS — I10 Essential (primary) hypertension: Secondary | ICD-10-CM | POA: Diagnosis not present

## 2021-12-30 DIAGNOSIS — Z7984 Long term (current) use of oral hypoglycemic drugs: Secondary | ICD-10-CM | POA: Diagnosis not present

## 2022-01-04 DIAGNOSIS — Z7982 Long term (current) use of aspirin: Secondary | ICD-10-CM | POA: Diagnosis not present

## 2022-01-04 DIAGNOSIS — N289 Disorder of kidney and ureter, unspecified: Secondary | ICD-10-CM | POA: Diagnosis not present

## 2022-01-04 DIAGNOSIS — N400 Enlarged prostate without lower urinary tract symptoms: Secondary | ICD-10-CM | POA: Diagnosis not present

## 2022-01-04 DIAGNOSIS — I10 Essential (primary) hypertension: Secondary | ICD-10-CM | POA: Diagnosis not present

## 2022-01-04 DIAGNOSIS — Z7984 Long term (current) use of oral hypoglycemic drugs: Secondary | ICD-10-CM | POA: Diagnosis not present

## 2022-01-04 DIAGNOSIS — E785 Hyperlipidemia, unspecified: Secondary | ICD-10-CM | POA: Diagnosis not present

## 2022-01-04 DIAGNOSIS — R69 Illness, unspecified: Secondary | ICD-10-CM | POA: Diagnosis not present

## 2022-01-04 DIAGNOSIS — E119 Type 2 diabetes mellitus without complications: Secondary | ICD-10-CM | POA: Diagnosis not present

## 2022-01-04 DIAGNOSIS — I69318 Other symptoms and signs involving cognitive functions following cerebral infarction: Secondary | ICD-10-CM | POA: Diagnosis not present

## 2022-01-04 DIAGNOSIS — I69354 Hemiplegia and hemiparesis following cerebral infarction affecting left non-dominant side: Secondary | ICD-10-CM | POA: Diagnosis not present

## 2022-02-06 ENCOUNTER — Encounter (HOSPITAL_COMMUNITY): Payer: Self-pay | Admitting: Emergency Medicine

## 2022-02-06 ENCOUNTER — Emergency Department (HOSPITAL_COMMUNITY): Payer: Medicare HMO

## 2022-02-06 ENCOUNTER — Emergency Department (HOSPITAL_COMMUNITY)
Admission: EM | Admit: 2022-02-06 | Discharge: 2022-02-06 | Disposition: A | Discharge status: Home / Self Care | Payer: Medicare HMO | Attending: Emergency Medicine | Admitting: Emergency Medicine

## 2022-02-06 DIAGNOSIS — F0390 Unspecified dementia without behavioral disturbance: Secondary | ICD-10-CM | POA: Insufficient documentation

## 2022-02-06 DIAGNOSIS — M1990 Unspecified osteoarthritis, unspecified site: Secondary | ICD-10-CM | POA: Diagnosis not present

## 2022-02-06 DIAGNOSIS — M79675 Pain in left toe(s): Secondary | ICD-10-CM | POA: Diagnosis not present

## 2022-02-06 DIAGNOSIS — E119 Type 2 diabetes mellitus without complications: Secondary | ICD-10-CM | POA: Insufficient documentation

## 2022-02-06 DIAGNOSIS — Z79899 Other long term (current) drug therapy: Secondary | ICD-10-CM | POA: Insufficient documentation

## 2022-02-06 DIAGNOSIS — Z7984 Long term (current) use of oral hypoglycemic drugs: Secondary | ICD-10-CM | POA: Insufficient documentation

## 2022-02-06 DIAGNOSIS — M79645 Pain in left finger(s): Secondary | ICD-10-CM | POA: Diagnosis not present

## 2022-02-06 DIAGNOSIS — R69 Illness, unspecified: Secondary | ICD-10-CM | POA: Diagnosis not present

## 2022-02-06 DIAGNOSIS — I10 Essential (primary) hypertension: Secondary | ICD-10-CM | POA: Insufficient documentation

## 2022-02-06 DIAGNOSIS — M7732 Calcaneal spur, left foot: Secondary | ICD-10-CM | POA: Diagnosis not present

## 2022-02-06 DIAGNOSIS — Z7901 Long term (current) use of anticoagulants: Secondary | ICD-10-CM | POA: Insufficient documentation

## 2022-02-06 DIAGNOSIS — Z7982 Long term (current) use of aspirin: Secondary | ICD-10-CM | POA: Diagnosis not present

## 2022-02-06 DIAGNOSIS — M19042 Primary osteoarthritis, left hand: Secondary | ICD-10-CM | POA: Diagnosis not present

## 2022-02-06 IMAGING — CR DG FOOT COMPLETE 3+V*L*
3 series · 3 of 3 positions shown · non-contrast
Comparison: None.

CLINICAL DATA: Left foot and left hand pain.  No injury.

EXAM:
LEFT FOOT - COMPLETE 3+ VIEW

[x foot ap left]
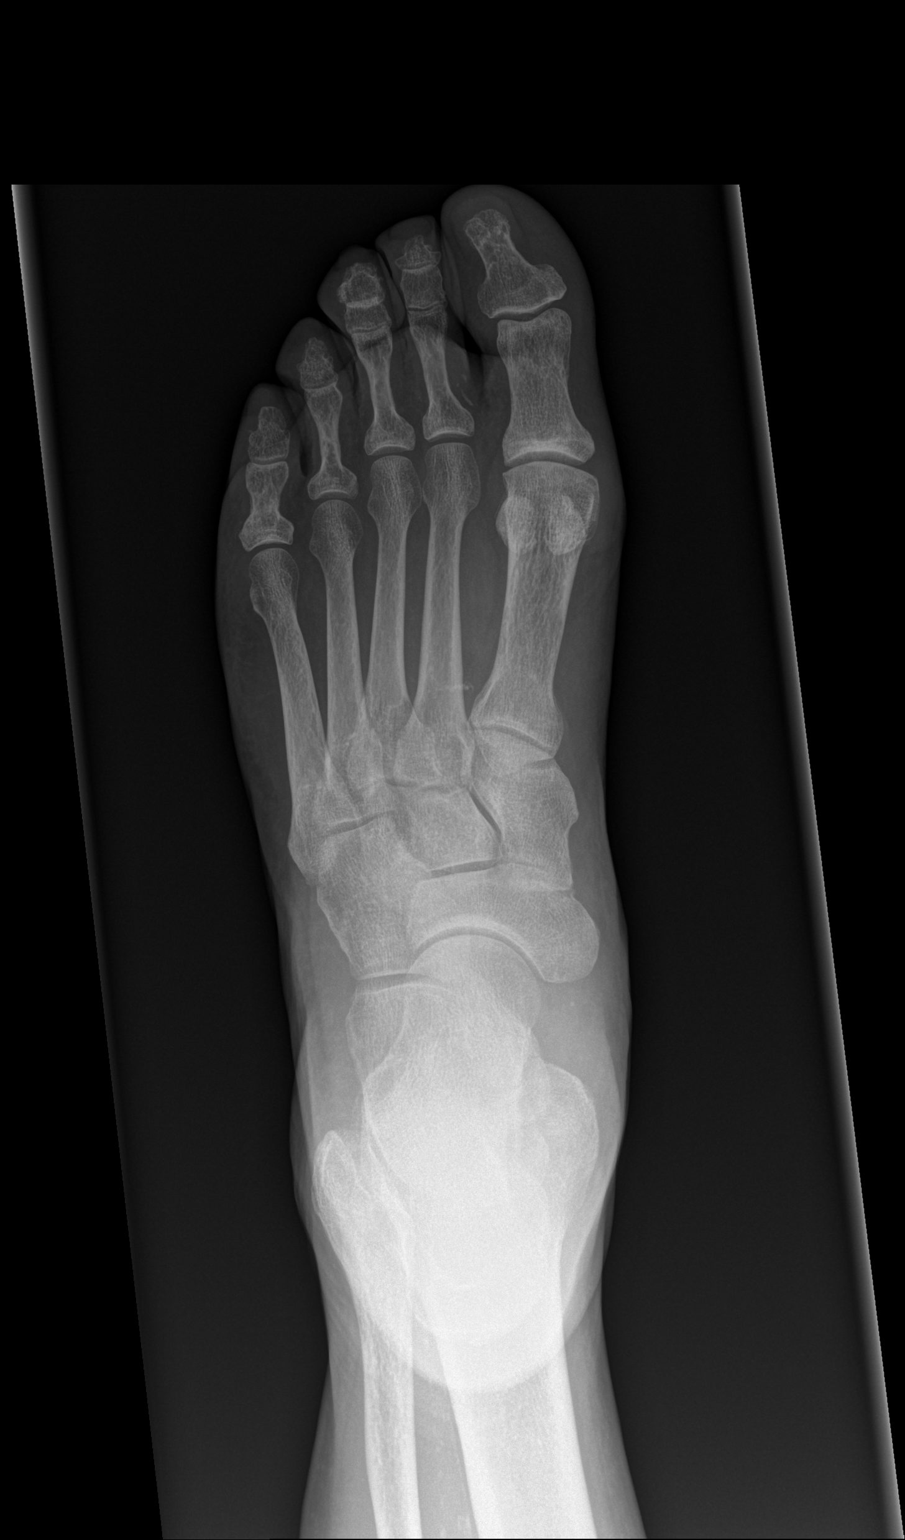

[x foot obl left]
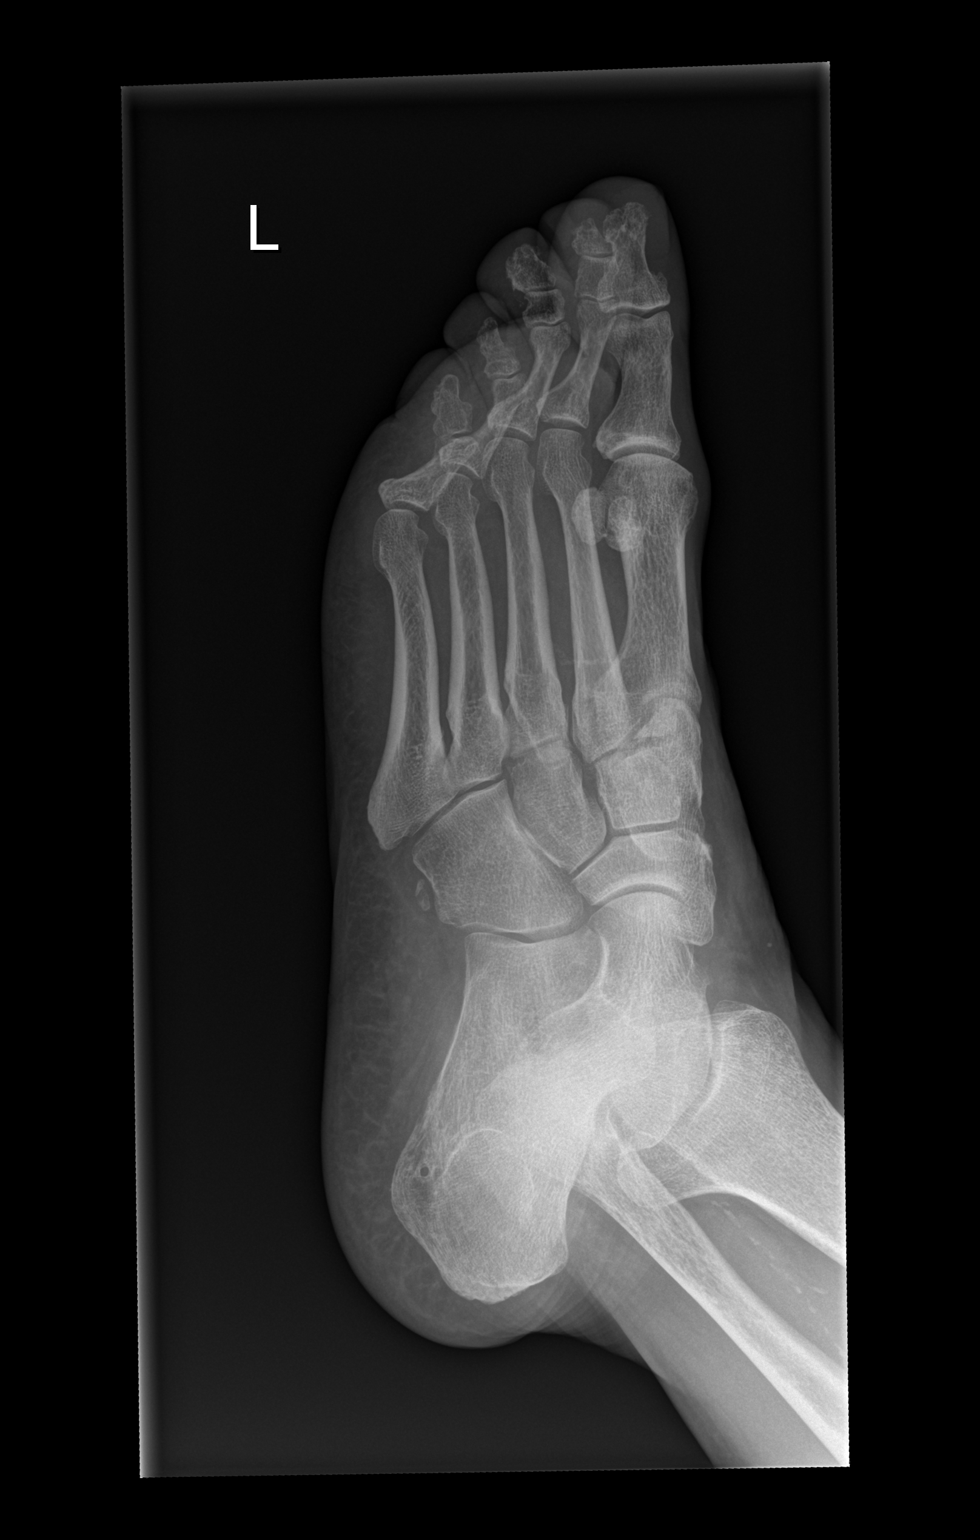

[x foot lat left]
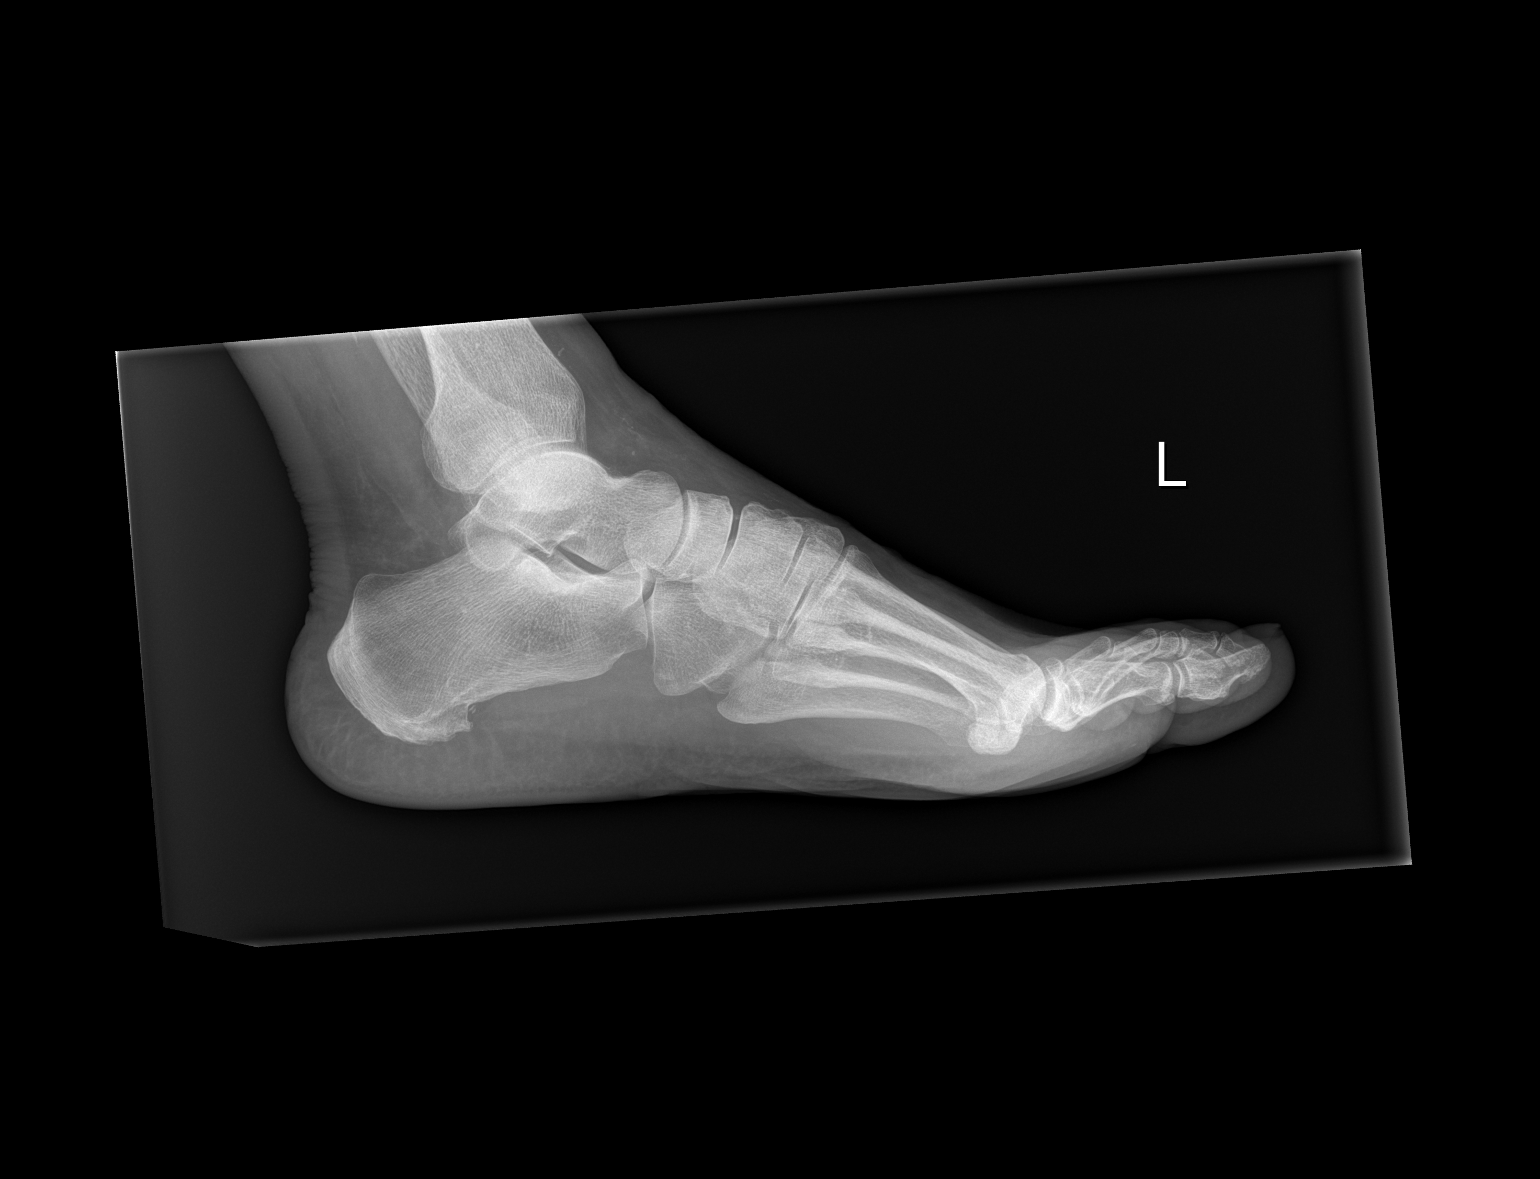

[3 of 3 positions shown; findings below may reference images not displayed]

FINDINGS: No fracture or bone lesion.

Joints normally spaced and aligned.

Small plantar calcaneal spur.

Soft tissues are unremarkable.
IMPRESSION: 1. No fracture.  No joint abnormality.
2. Small plantar calcaneal spur.

## 2022-02-06 IMAGING — CR DG HAND COMPLETE 3+V*L*
3 series · 3 of 3 positions shown · non-contrast
Comparison: None.

CLINICAL DATA: Left hand and left foot pain.  No injury.

EXAM:
LEFT HAND - COMPLETE 3+ VIEW

[x hand pa left]
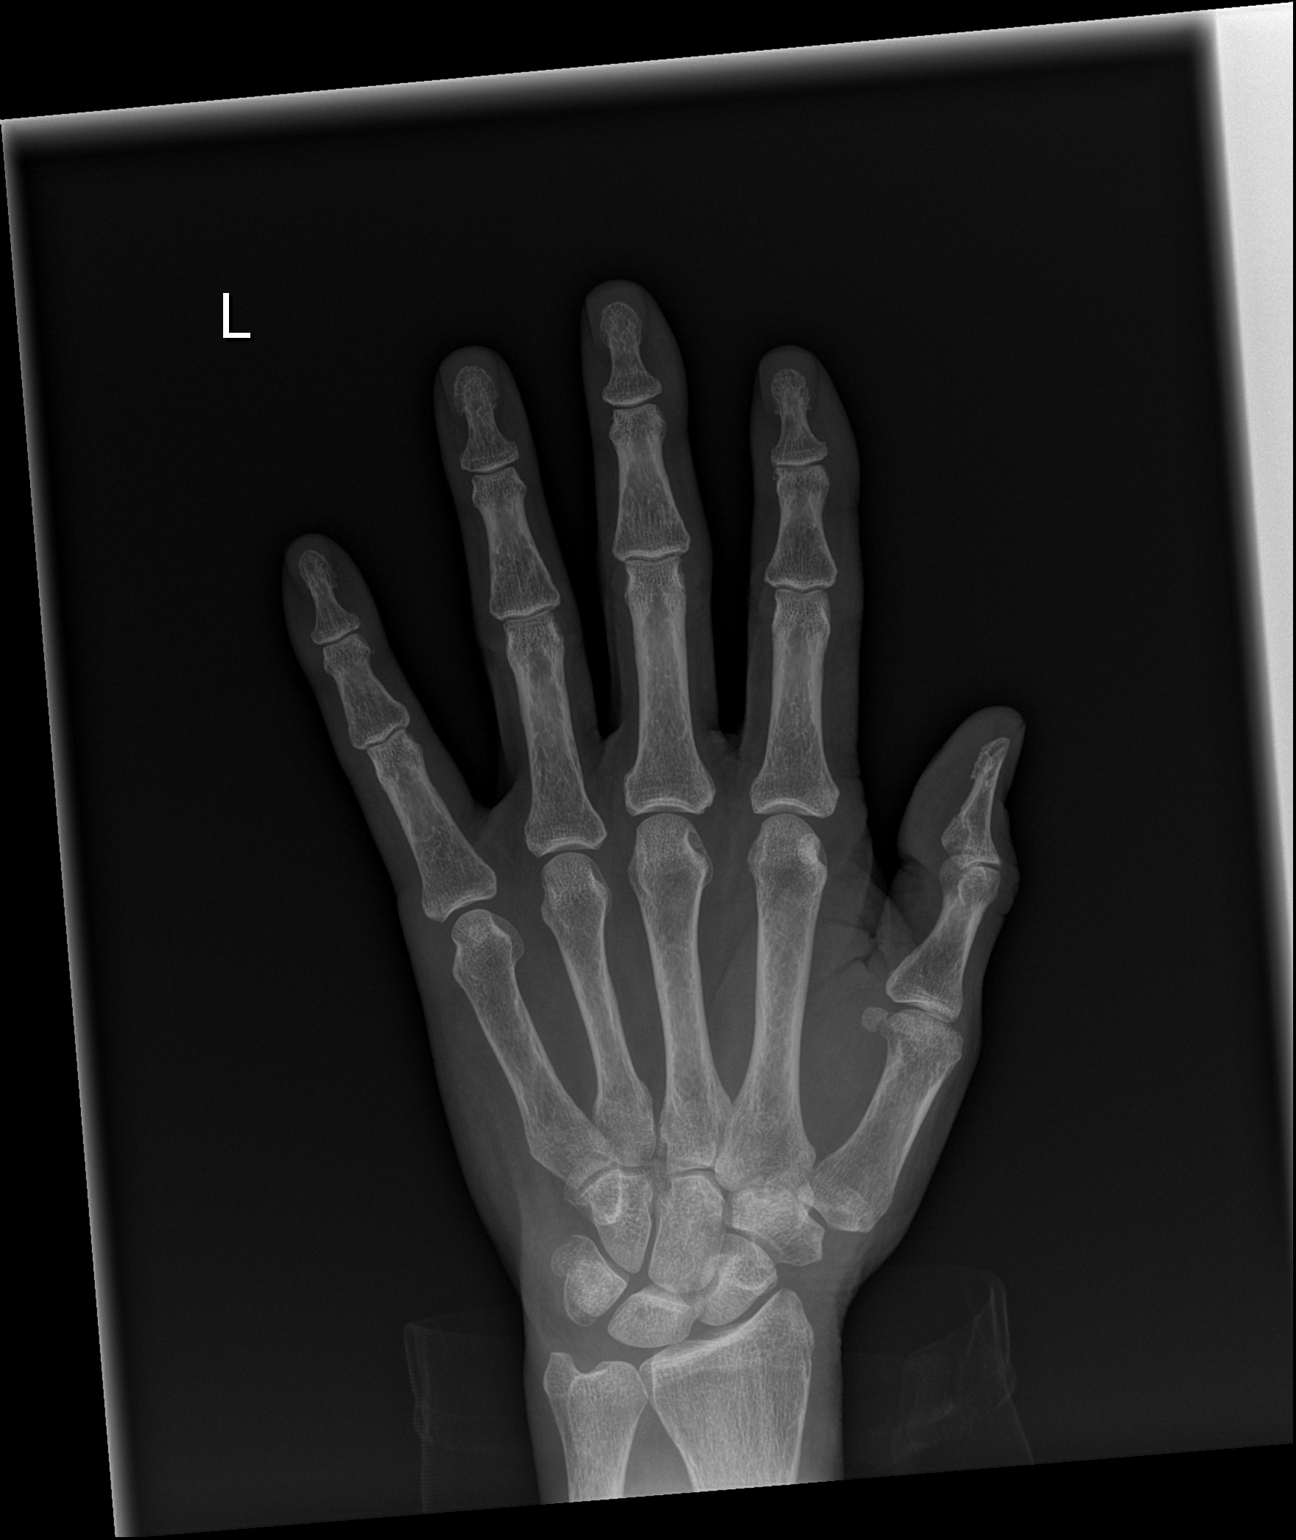

[x hand obl left]
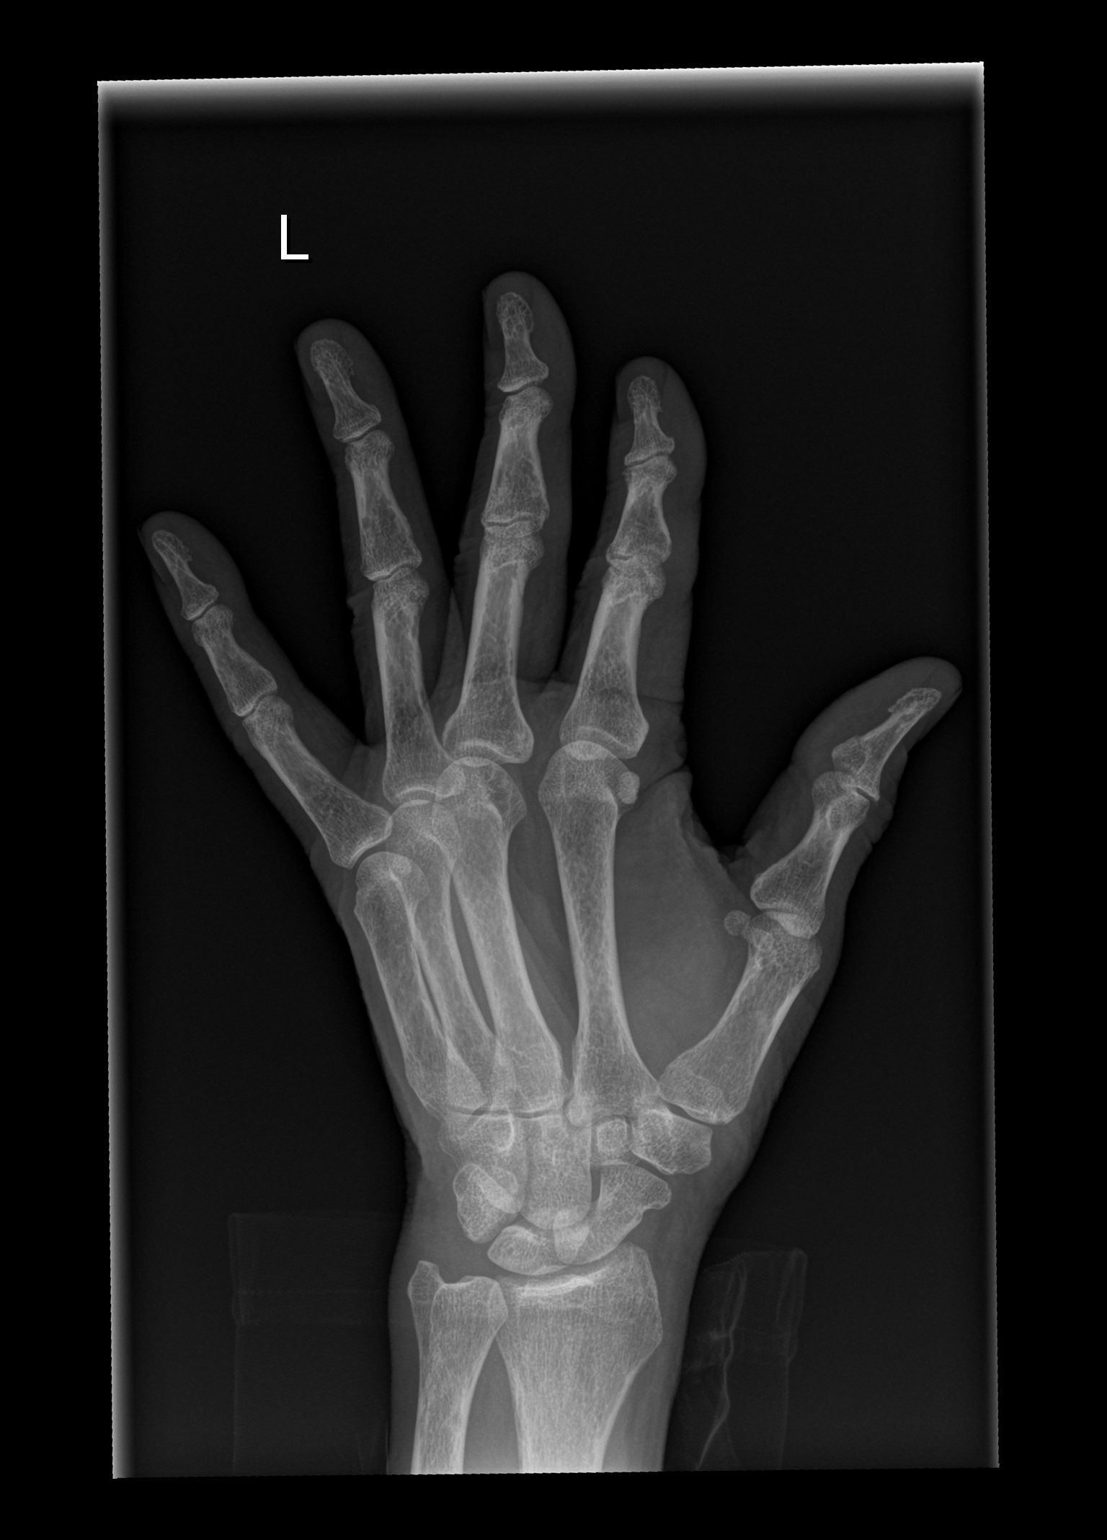

[x hand lat left]
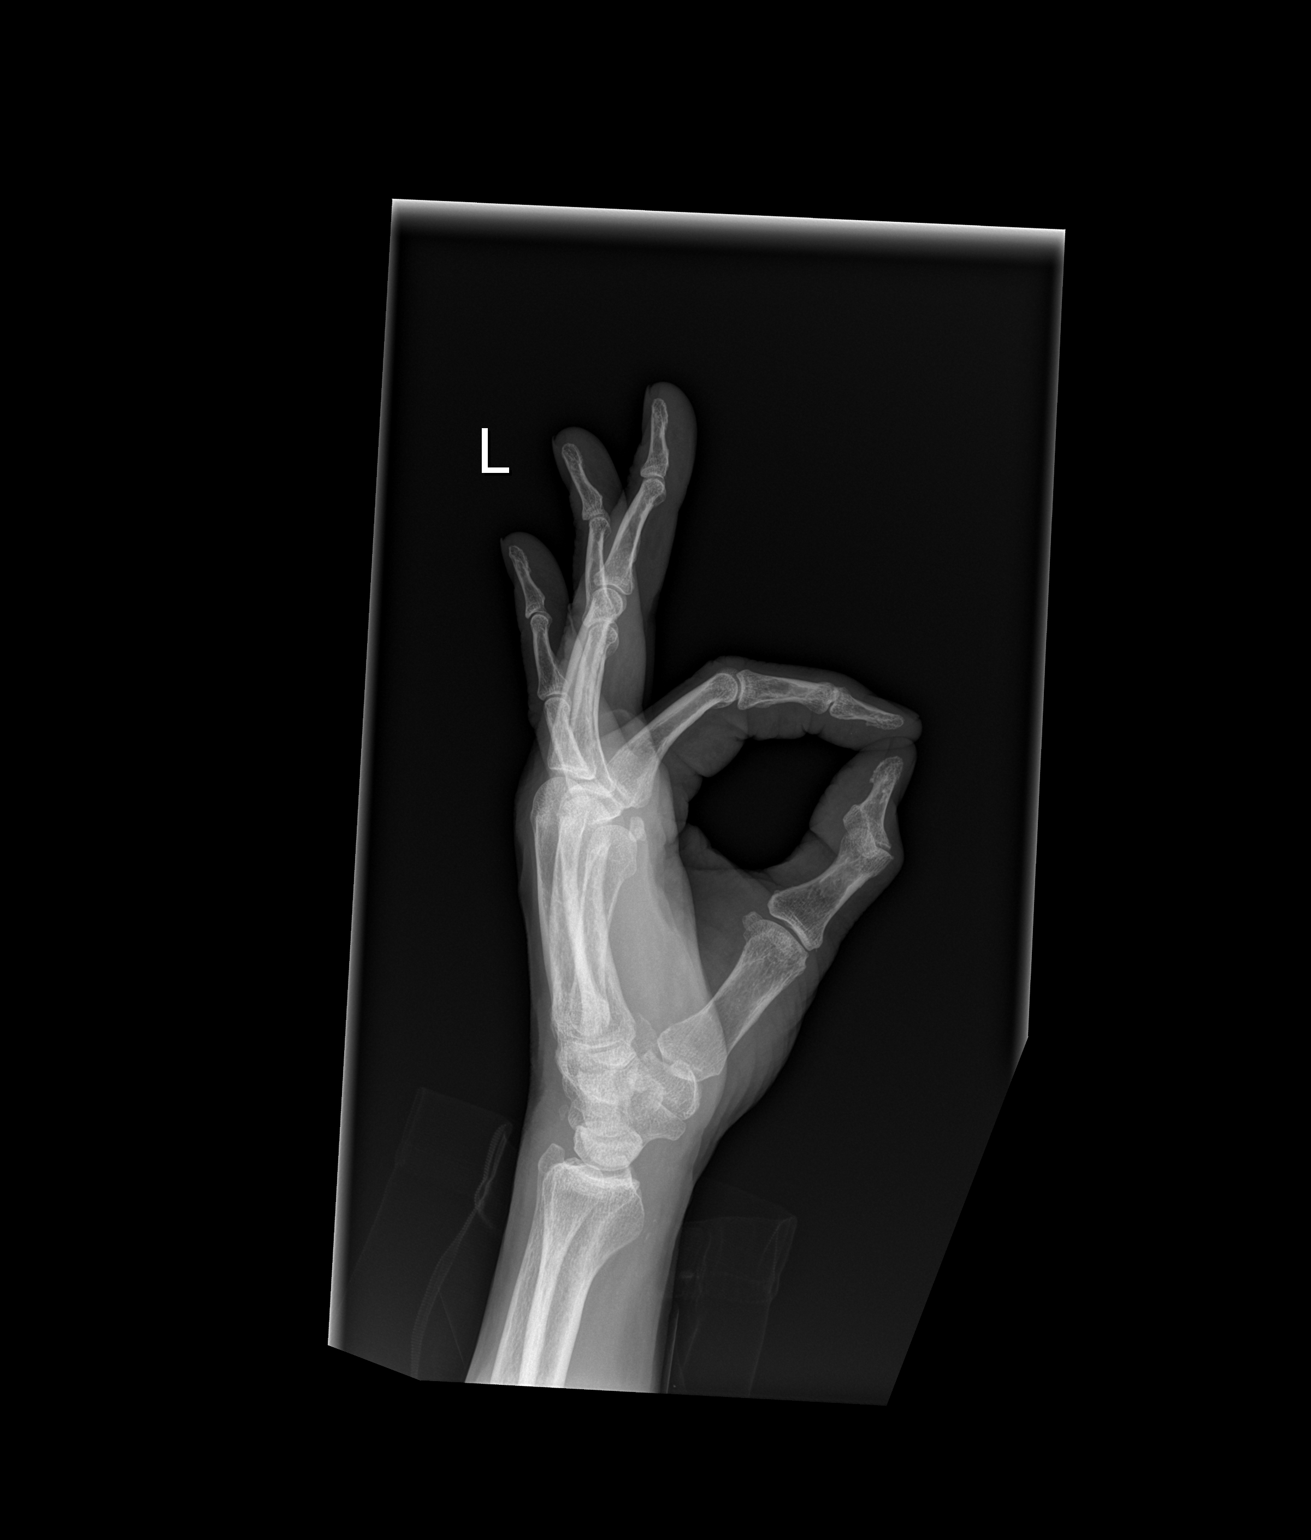

[3 of 3 positions shown; findings below may reference images not displayed]

FINDINGS: No fracture or bone lesion.

Minimal asymmetric narrowing and marginal osteophytes at the DIP
joint of the index finger consistent with osteoarthritis. Remaining
joints normally spaced and aligned. No periarticular erosions.

Soft tissues are unremarkable.
IMPRESSION: 1. No fracture or acute finding.
2. Minor index finger DIP joint osteoarthritis. No other
degenerative/arthropathic changes.

## 2022-02-06 MED ORDER — IBUPROFEN 200 MG PO TABS
600.0000 mg | ORAL_TABLET | Freq: Once | ORAL | Status: AC
  Administered 2022-02-06: 600 mg via ORAL
  Filled 2022-02-06: qty 3

## 2022-02-06 MED ORDER — IBUPROFEN 600 MG PO TABS: 600.0000 mg | ORAL_TABLET | Freq: Four times a day (QID) | ORAL | 0 refills | Status: DC | PRN

## 2022-02-06 NOTE — ED Provider Triage Note (Signed)
Emergency Medicine Provider Triage Evaluation Note ? ?[place] , a 88 y.o. male  was evaluated in triage.  Pt complains of pain in his left foot and left hand. No injury ? ?Review of Systems  ?Positive: Pain in big toe and 2 fingers on left hand ?Negative: Fever no injury ? ?Physical Exam  ?BP 130/89 (BP Location: Left Arm)   Pulse 91   Temp 98.2 ?F (36.8 ?C) (Oral)   Resp 18   SpO2 99%  ?Gen:   Awake, no distress   ?Resp:  Normal effort  ?MSK:   Moves extremities without difficulty  ?Other:  No sign of infection  from  ? ?Medical Decision Making  ?Medically screening exam initiated at 4:58 PM.  Appropriate orders placed.  [place] was informed that the remainder of the evaluation will be completed by another provider, this initial triage assessment does not replace that evaluation, and the importance of remaining in the ED until their evaluation is complete. ? ? ?  ?Fransico Meadow, [place] ?02/06/22 1659 ? ?

## 2022-02-06 NOTE — ED Triage Notes (Signed)
Patient c/o sore L great toe for weeks. Denies injury. ?

## 2022-02-06 NOTE — Discharge Instructions (Addendum)
Yarnell was seen in the emergency department today for toe and finger pain. ? ?Based on x-rays, I think his symptoms are likely related to arthritis.  This is best treated with anti-inflammatory medicines like ibuprofen or Tylenol. ? ?I have attached the contact information for the orthopedic doctor, to call and make a follow-up appointment if his symptoms persist. ? ?Continue to monitor how he's doing and return to the ER for new or worsening symptoms.  ?

## 2022-02-06 NOTE — ED Notes (Signed)
Spoke with patient's daughter and legal guardian who is aware patient is in the ED and will be his ride home at time of discharge. ?

## 2022-02-06 NOTE — ED Provider Notes (Signed)
?[facility] DEPT ?Provider Note ? ? ?CSN: [id number] ?Arrival date & time: 02/06/22  1640 ? ?  ? ?History ? ?Chief Complaint  ?Patient presents with  ? Toe Pain  ? ? ?Erik Moss is a 88 y.o. male who presents emergency department for pain in his big toe and 2 fingers on his left hand.  Patient states that they have been bothering him for several weeks.  He describes the pain as "sore".  His symptoms are made worse with weightbearing and trying to bend his fingers.  No recent injury or illness.  No fever.  No history of gout. ? ? ?Toe Pain ? ? ?  ? ?Home Medications ?Prior to Admission medications   ?Medication Sig Start Date End Date Taking? Authorizing Provider  ?ibuprofen (ADVIL) 600 MG tablet Take 1 tablet (600 mg total) by mouth every 6 (six) hours as needed. 02/06/22  Yes Priseis Cratty T, PA-C  ?amLODipine (NORVASC) 5 MG tablet Take 5 mg by mouth daily.    [provider]  ?aspirin EC 81 MG tablet Take 81 mg by mouth daily. Swallow whole.    [provider]  ?atorvastatin (LIPITOR) 40 MG tablet Take 1 tablet (40 mg total) by mouth daily. 10/10/19 09/24/20  [place] [name], DO  ?blood glucose meter kit and supplies Dispense based on patient and insurance preference. Use up to four times daily as directed. (FOR ICD-10 E10.9, E11.9). 09/25/20   [name], MD  ?Cholecalciferol (D3 PO) Take 1 capsule by mouth daily.    [provider]  ?clopidogrel (PLAVIX) 75 MG tablet Take 75 mg by mouth daily. 11/05/19   [provider]  ?diphenhydrAMINE (BENADRYL) 12.5 MG/5ML elixir Take 6.25 mg by mouth 4 (four) times daily as needed for allergies.    [provider]  ?docusate sodium (COLACE) 100 MG capsule Take 100 mg by mouth daily.    [provider]  ?glimepiride (AMARYL) 2 MG tablet Take 2 mg by mouth in the morning and at bedtime.    [provider]  ?memantine (NAMENDA) 10 MG tablet Take 1 tablet (10 mg total) by mouth 2  (two) times daily. 11/30/21   [name], MD  ?mirabegron ER (MYRBETRIQ) 50 MG TB24 tablet Take 50 mg by mouth daily.    [provider]  ?Multiple Vitamin (MULTIVITAMIN WITH MINERALS) TABS tablet Take 1 tablet by mouth daily.    [provider]  ?sitaGLIPtin (JANUVIA) 25 MG tablet Take 25 mg by mouth daily.    [provider]  ?sodium bicarbonate 650 MG tablet Take 650 mg by mouth 2 (two) times daily.    [provider]  ?   ? ?Allergies    ?Patient has no known allergies.   ? ?Review of Systems   ?Review of Systems  ?Musculoskeletal:  Positive for arthralgias.  ?All other systems reviewed and are negative. ? ?Physical Exam ?Updated Vital Signs ?BP 132/90 (BP Location: Left Arm)   Pulse 90   Temp 98.5 ?F (36.9 ?C) (Oral)   Resp 17   SpO2 100%  ?Physical Exam ?Vitals and nursing note reviewed.  ?Constitutional:   ?   Appearance: Normal appearance.  ?HENT:  ?   Head: Normocephalic and atraumatic.  ?Eyes:  ?   Conjunctiva/sclera: Conjunctivae normal.  ?Pulmonary:  ?   Effort: Pulmonary effort is normal. No respiratory distress.  ?Musculoskeletal:  ?   Comments: Decreased passive ROM of left 4th finger due to pain. No deformities,  contusions, abrasions, punctures/penetrations, burns, tenderness, lacerations or swelling of the fingers or toes.   ?Skin: ?   General: Skin is warm and dry.  ?Neurological:  ?   Mental Status: He is alert.  ?Psychiatric:     ?   Mood and Affect: Mood normal.     ?   Behavior: Behavior normal.  ? ? ?ED Results / Procedures / Treatments   ?Labs ?(all labs ordered are listed, but only abnormal results are displayed) ?Labs Reviewed - No data to display ? ?EKG ?None ? ?Radiology ?DG Hand Complete Left ? ?Result Date: 02/06/2022 ?CLINICAL DATA:  Left hand and left foot pain.  No injury. EXAM: LEFT HAND - COMPLETE 3+ VIEW COMPARISON:  None. FINDINGS: No fracture or bone lesion. Minimal asymmetric narrowing and marginal osteophytes at the DIP joint of the index  finger consistent with osteoarthritis. Remaining joints normally spaced and aligned. No periarticular erosions. Soft tissues are unremarkable. IMPRESSION: 1. No fracture or acute finding. 2. Minor index finger DIP joint osteoarthritis. No other degenerative/arthropathic changes. Electronically Signed   By: Lajean Manes M.D.   On: 02/06/2022 17:54  ? ?DG Foot Complete Left ? ?Result Date: 02/06/2022 ?CLINICAL DATA:  Left foot and left hand pain.  No injury. EXAM: LEFT FOOT - COMPLETE 3+ VIEW COMPARISON:  None. FINDINGS: No fracture or bone lesion. Joints normally spaced and aligned. Small plantar calcaneal spur. Soft tissues are unremarkable. IMPRESSION: 1. No fracture.  No joint abnormality. 2. Small plantar calcaneal spur. Electronically Signed   By: Lajean Manes M.D.   On: 02/06/2022 17:52   ? ?Procedures ?Procedures  ? ? ?Medications Ordered in ED ?Medications  ?ibuprofen (ADVIL) tablet 600 mg (600 mg Oral Given 02/06/22 1938)  ? ? ?ED Course/ Medical Decision Making/ A&P ?Clinical Course as of 02/06/22 2002  ?Sun Feb 06, 2022  ?1805 DG Hand Complete Left [AH]  ?1805 DG Foot Complete Left [AH]  ?  ?Clinical Course User Index ?[AH] Margarita Mail, PA-C  ? ?                        ?Medical Decision Making ?Amount and/or Complexity of Data Reviewed ?Radiology: ordered. Decision-making details documented in ED Course. ? ?Risk ?OTC drugs. ?Prescription drug management. ? ? ?This patient is a 88 year old male who presents to the ED for concern of hand and foot pain.  ? ?Differential diagnoses prior to evaluation: ?The emergent differential diagnosis includes, but is not limited to,  fractures, dislocations, wounds, cellulitis, ulcer, gout, septic arthritis.  This is not an exhaustive differential.  ? ?Past Medical History / Co-morbidities: ?CVA, HTN, type 2 diabetes, dementia, HLD ? ?Physical Exam: ?Physical exam performed. The pertinent findings include: Decreased passive ROM of left 4th finger due to pain. No  deformities, contusions, abrasions, punctures/penetrations, burns, tenderness, lacerations or swelling of the fingers or toes.  ? ?Lab Tests/Imaging studies: ?I Ordered, and personally interpreted labs/imaging including x-rays of left hand and left foot.  The pertinent results include:  no acute fractures or dislocations. I agree with the radiologist interpretation. ?  ?Medications: ?I ordered medication including ibuprofen  for joint pain.  I have reviewed the patients home medicines and have made adjustments as needed. ?  ?Disposition: ?After consideration of the diagnostic results and the patients response to treatment, I feel that patient's not requiring mission or inpatient treatment for symptoms.  I suspect symptoms are likely due to arthritis.  Recommended the patient take  ibuprofen as needed for pain.  He has not had any fever, recent illness, and the joint is not to a suspicion for septic arthritis. Discussed reasons to return to the emergency department, and the patient and caregiver are agreeable to the plan.  ? ?Final Clinical Impression(s) / ED Diagnoses ?Final diagnoses:  ?Pain of left great toe  ?Pain in left finger(s)  ?Arthritis  ? ? ?Rx / DC Orders ?ED Discharge Orders   ? ?      Ordered  ?  ibuprofen (ADVIL) 600 MG tablet  Every 6 hours PRN       ? 02/06/22 1930  ? ?  ?  ? ?  ? ?Portions of this report may have been transcribed using voice recognition software. Every effort was made to ensure accuracy; however, inadvertent computerized transcription errors may be present. ? ?  ?Jaquayla Hege T, PA-C ?02/06/22 2002 ? ?  ?Regan Lemming, MD ?02/06/22 2307 ? ?

## 2022-02-08 DIAGNOSIS — N281 Cyst of kidney, acquired: Secondary | ICD-10-CM | POA: Diagnosis not present

## 2022-02-08 DIAGNOSIS — N189 Chronic kidney disease, unspecified: Secondary | ICD-10-CM | POA: Diagnosis not present

## 2022-02-08 DIAGNOSIS — I129 Hypertensive chronic kidney disease with stage 1 through stage 4 chronic kidney disease, or unspecified chronic kidney disease: Secondary | ICD-10-CM | POA: Diagnosis not present

## 2022-02-08 DIAGNOSIS — N1832 Chronic kidney disease, stage 3b: Secondary | ICD-10-CM | POA: Diagnosis not present

## 2022-02-08 DIAGNOSIS — D631 Anemia in chronic kidney disease: Secondary | ICD-10-CM | POA: Diagnosis not present

## 2022-02-08 DIAGNOSIS — R69 Illness, unspecified: Secondary | ICD-10-CM | POA: Diagnosis not present

## 2022-02-08 DIAGNOSIS — N400 Enlarged prostate without lower urinary tract symptoms: Secondary | ICD-10-CM | POA: Diagnosis not present

## 2022-02-08 DIAGNOSIS — E1122 Type 2 diabetes mellitus with diabetic chronic kidney disease: Secondary | ICD-10-CM | POA: Diagnosis not present

## 2022-02-08 DIAGNOSIS — E872 Acidosis, unspecified: Secondary | ICD-10-CM | POA: Diagnosis not present

## 2022-02-08 DIAGNOSIS — E875 Hyperkalemia: Secondary | ICD-10-CM | POA: Diagnosis not present

## 2022-04-06 DIAGNOSIS — Z008 Encounter for other general examination: Secondary | ICD-10-CM | POA: Diagnosis not present

## 2022-04-06 DIAGNOSIS — R69 Illness, unspecified: Secondary | ICD-10-CM | POA: Diagnosis not present

## 2022-04-06 DIAGNOSIS — N529 Male erectile dysfunction, unspecified: Secondary | ICD-10-CM | POA: Diagnosis not present

## 2022-04-06 DIAGNOSIS — I10 Essential (primary) hypertension: Secondary | ICD-10-CM | POA: Diagnosis not present

## 2022-04-06 DIAGNOSIS — G309 Alzheimer's disease, unspecified: Secondary | ICD-10-CM | POA: Diagnosis not present

## 2022-04-06 DIAGNOSIS — Z7902 Long term (current) use of antithrombotics/antiplatelets: Secondary | ICD-10-CM | POA: Diagnosis not present

## 2022-04-06 DIAGNOSIS — N1832 Chronic kidney disease, stage 3b: Secondary | ICD-10-CM | POA: Diagnosis not present

## 2022-04-06 DIAGNOSIS — F0280 Dementia in other diseases classified elsewhere without behavioral disturbance: Secondary | ICD-10-CM | POA: Diagnosis not present

## 2022-04-06 DIAGNOSIS — E785 Hyperlipidemia, unspecified: Secondary | ICD-10-CM | POA: Diagnosis not present

## 2022-04-06 DIAGNOSIS — R350 Frequency of micturition: Secondary | ICD-10-CM | POA: Diagnosis not present

## 2022-04-06 DIAGNOSIS — Z7984 Long term (current) use of oral hypoglycemic drugs: Secondary | ICD-10-CM | POA: Diagnosis not present

## 2022-04-06 DIAGNOSIS — E1122 Type 2 diabetes mellitus with diabetic chronic kidney disease: Secondary | ICD-10-CM | POA: Diagnosis not present

## 2022-04-06 DIAGNOSIS — N400 Enlarged prostate without lower urinary tract symptoms: Secondary | ICD-10-CM | POA: Diagnosis not present

## 2022-05-02 DIAGNOSIS — E1169 Type 2 diabetes mellitus with other specified complication: Secondary | ICD-10-CM | POA: Diagnosis not present

## 2022-05-02 DIAGNOSIS — I10 Essential (primary) hypertension: Secondary | ICD-10-CM | POA: Diagnosis not present

## 2022-05-02 DIAGNOSIS — R809 Proteinuria, unspecified: Secondary | ICD-10-CM | POA: Diagnosis not present

## 2022-05-02 DIAGNOSIS — M79675 Pain in left toe(s): Secondary | ICD-10-CM | POA: Diagnosis not present

## 2022-05-02 DIAGNOSIS — M79645 Pain in left finger(s): Secondary | ICD-10-CM | POA: Diagnosis not present

## 2022-05-02 DIAGNOSIS — R69 Illness, unspecified: Secondary | ICD-10-CM | POA: Diagnosis not present

## 2022-05-02 DIAGNOSIS — N184 Chronic kidney disease, stage 4 (severe): Secondary | ICD-10-CM | POA: Diagnosis not present

## 2022-05-02 DIAGNOSIS — D638 Anemia in other chronic diseases classified elsewhere: Secondary | ICD-10-CM | POA: Diagnosis not present

## 2022-05-26 NOTE — Progress Notes (Signed)
[facility] [address] [place]. [place] [address] [phone]       OFFICE FOLLOW UP NOTE  [name] Date of Birth:  [birth year] Medical Record Number:  [id number]   Reason for visit: hx of stroke, dementia, imbalance    SUBJECTIVE:   CHIEF COMPLAINT:  Chief Complaint  Patient presents with   Follow-up    RM 3 with daughter paula  Pt is well, no new stroke or memory concerns. He and daughter states both are stable but has had more trouble with gait     HPI:    [place] Moss is a 88 year old male, seen in request by his PCP [name], NP for evaluation of transient confusion with initial evaluation by Dr. Krista Moss on 02/23/2021.   I reviewed and summarized the referring note.  Past medical history Hypertension Hyperlipidemia Diabetes Prostate hypertrophy   Consult visit 02/23/2021 Dr. Krista Moss: He is retired [employer], he retired at age 89s, since then, he completed total of 9 masters degree, involving museum, [place] history, creative writing English, Air cabin crew, international relationship management,   Prior to 2020, he lives alone, moved to his daughter during pandemic, was noted to have slow worsening memory loss,    He was admitted to hospital in December 2020, was noted to have acute onset left-sided weakness, dragging his left leg while walking   MRI of the brain on October 08, 2019 showed small region of acute infarction involving right lateral thalamus, posterior limb of internal capsule, was also noted to have poorly controlled diabetes, A1c up to 10.5,   Extensive evaluation showed no significant large vessel disease, no significant abnormality on echocardiogram,   He was started on Plavix 75 mg daily, also taking aspirin 81 mg daily, daughter began to help in managing his medications   He recovered almost back to his baseline, he wished to register for college class, but was denied, he was noted to have  worsening memory loss, he still enjoying reading newspaper, walk few miles each day,   His mother lived to more than 41 years old, began to develop mild memory loss in her 77s   In January 26, 2021, woke up in the morning, he was noted by his daughter to be slow reaction, mildly slurred speech, confused, was taken to the emergency room, MRI of the brain showed no acute abnormality   UPDATE Nov 30 2021 Dr. Krista Moss: He lives with his daughter now, still likes to read newspaper, also exercise regularly, his memory continues to decline, MoCA examination is 18/30 today, daughter also reported that he has increased gait abnormality, occasionally bowel and bladder incontinence.   Personally reviewed MRI of brain on Aug 23 2021:1. No evidence of acute intracranial abnormality, including acute infarct.2. Similar mild-to-moderate chronic microvascular disease and remote lacunar infarcts. He is tolerating Aricept 10 mg daily Laboratory evaluations October 2022: CBC showed hemoglobin of 11.4, CMP, creatinine 2.78, negative alcohol, negative UDS   UPDATE 05/26/2022 JM:   He is accompanied today by his daughter, follow-up.  Overall stable since prior visit without any new stroke/TIA symptoms.  Compliant on Plavix and atorvastatin, denies side effects.  Also reports continuing on aspirin 81 mg daily for stroke prevention purposes Blood pressure 142/77 - does not routinely monitor at home Routinely follows with PCP for stroke risk factor management with recent visit yesterday  Both patient and daughter report cognition has been stable since prior visit Remains on Namenda 10 mg twice daily,  denies side effects. Per daughter, was never on Aricept previously  [place] 12/30 (prior 18/30 11/2021)  MRI of cervical spine showed C4-5 spinal stenosis with possible nerve root compression on the left. Daughter believes his gait continues to gradually decline.  He does try to stay active and ambulates around his yard.  Does not  use any assistive device, denies any recent falls.  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  Denies any hand or feet numbness/tingling.  Occasional bowel and bladder incontinence, unchanged since prior visit.  Denies any neck pain.  No further concerns at this time     MRI CERVICAL SPINE 12/11/2021 IMPRESSION: This MRI of the cervical spine without contrast shows the following: 1.   Abnormal signal within the spinal cord at C4 to C4-C5 involving the anterior and posterior horns of the spinal cord and small T2 hyperintense foci to the right and posterolaterally to the right adjacent to C5.  These are consistent with myelopathic signal changes, likely related to the spinal stenosis and anterolisthesis at C4-C5.  Additionally at this level there is moderately severe left foraminal narrowing with potential for left C5 nerve root compression. 2.   At C3-C4, there are degenerative changes causing mild spinal stenosis and moderate bilateral foraminal narrowing but no definite nerve root compression. 3.   At C5-C6, there are degenerative changes causing mild spinal stenosis and moderately severe left foraminal narrowing and moderate right foraminal narrowing.  There is potential for left C6 nerve root compression. 4.   At C6-C7, there are degenerative changes causing moderate spinal stenosis and moderate bilateral foraminal narrowing but no definite nerve root compression. 5.   At C7-T1, there is anterolisthesis and other degenerative changes causing mild spinal stenosis and moderate bilateral foraminal narrowing but no definite nerve root compression. 6.   Degenerative changes at C2-C3 and T1-T2 do not lead to spinal stenosis.  There is foraminal narrowing but no definite nerve root compression.     ROS:   14 system review of systems performed and negative with exception of those listed  in HPI  PMH:  Past Medical History:  Diagnosis Date   BPH (benign prostatic hyperplasia) 10/08/2019   Diabetes mellitus without complication ([place])    Hyperlipidemia 10/08/2019   Hypertension 10/08/2019   Renal disease    Stroke [facility])    TIA (transient ischemic attack)    Type 2 diabetes mellitus ([place]) 10/08/2019    PSH:  Past Surgical History:  Procedure Laterality Date   BUBBLE STUDY  10/10/2019   Procedure: BUBBLE STUDY;  Surgeon: Geralynn Rile, MD;  Location: [place];  Service: Cardiology;;   TEE WITHOUT CARDIOVERSION N/A 10/10/2019   Procedure: TRANSESOPHAGEAL ECHOCARDIOGRAM (TEE);  Surgeon: Geralynn Rile, MD;  Location: [place];  Service: Cardiology;  Laterality: N/A;    Social History:  Social History   Socioeconomic History   Marital status: Divorced    Spouse name: Not on file   Number of children: 1   Years of education: collge   Highest education level: Master's degree (e.g., MA, MS, MEng, MEd, MSW, MBA)  Occupational History   Occupation: Retired  Tobacco Use   Smoking status: Never   Smokeless tobacco: Never  Substance and Sexual Activity   Alcohol use: Never   Drug use: Never   Sexual activity: Not on file  Other Topics Concern   Not on file  Social History Narrative   11/19/19 lives with dgtr, grand dgtr.   Left-handed.   Reports having nine masters' degrees.   1 cup tea each morning.   Social Determinants of Health   Financial Resource Strain: Not on file  Food Insecurity: Not on file  Transportation Needs: Not on file  Physical Activity: Not on file  Stress: Not on file  Social Connections: Not on file  Intimate Partner Violence: Not on file    Family History:  Family History  Problem Relation Age of Onset   Other Mother        Passed from "old age"   Other Father        Accident    Medications:   Current Outpatient Medications on File Prior to Visit  Medication Sig Dispense Refill   amLODipine (NORVASC)  5 MG tablet Take 5 mg by mouth daily.     aspirin EC 81 MG tablet Take 81 mg by mouth daily. Swallow whole.     blood glucose meter kit and supplies Dispense based on patient and insurance preference. Use up to four times daily as directed. (FOR ICD-10 E10.9, E11.9). 1 each 0   Cholecalciferol (D3 PO) Take 1 capsule by mouth daily.     clopidogrel (PLAVIX) 75 MG tablet Take 75 mg by mouth daily.     diphenhydrAMINE (BENADRYL) 12.5 MG/5ML elixir Take 6.25 mg by mouth 4 (four) times daily as needed for allergies.     docusate sodium (COLACE) 100 MG capsule Take 100 mg by mouth daily.     glimepiride (AMARYL) 2 MG tablet Take 2 mg by mouth in the morning and at bedtime.     memantine (NAMENDA) 10 MG tablet Take 1 tablet (10 mg total) by mouth 2 (two) times daily. 180 tablet 4   mirabegron ER (MYRBETRIQ) 50 MG TB24 tablet Take 50 mg by mouth daily.     sitaGLIPtin (JANUVIA) 25 MG tablet Take 25 mg by mouth daily.     sodium bicarbonate 650 MG tablet Take 650 mg by mouth 2 (two) times daily.     atorvastatin (LIPITOR) 40 MG tablet Take 1 tablet (40 mg total) by mouth daily. 90 tablet 0   No current facility-administered medications on file prior to visit.    Allergies:  No Known Allergies    OBJECTIVE:  Physical Exam  Vitals:   05/31/22 0735  BP: (!) 142/77  Pulse: 82  Weight: 144 lb (65.3 kg)  Height: _0  (1.676 m)   Body mass index is 23.24 kg/m. No results found.   General: well developed, well nourished, frail very pleasant elderly African-American male, seated, in no evident distress Head: head normocephalic and atraumatic.   Neck: supple with no carotid or supraclavicular bruits Cardiovascular: regular rate and rhythm, no murmurs Musculoskeletal: no deformity Skin:  no rash/petichiae Vascular:  Normal pulses all extremities   Neurologic Exam Mental Status: Awake and fully alert.  Fluent speech and language.  Oriented to place and time. Recent and remote memory intact.  Attention span, concentration and fund of knowledge appropriate. Mood and affect appropriate.  Cranial Nerves: Pupils equal, briskly reactive to light. Extraocular movements full without nystagmus. Visual fields full to confrontation.  HOH bilaterally. Facial sensation intact. Face, tongue, palate moves normally and symmetrically.  Motor: Normal bulk and tone. Normal strength in all tested extremity muscles Sensory.: intact to touch , pinprick , position and vibratory sensation.  Coordination: Rapid alternating movements normal in all extremities. Finger-to-nose and heel-to-shin  performed accurately bilaterally. Gait and Station: Arises from chair without difficulty. Stance is stooped. Gait demonstrates decreased stride length and step height bilaterally with mild unsteadiness and occasional veering towards the right.  No assistive device. Tandem walk and heel toe not attempted Reflexes: 1+ and symmetric. Toes downgoing.      05/31/2022    7:37 AM 11/30/2021    7:30 AM 02/25/2021    9:00 PM  [place] Cognitive Assessment   Visuospatial/ Executive (0/5) 0 3 1  Naming (0/3) 0 1 3  Attention: Read list of digits (0/2) _0 Attention: Read list of letters (0/1) _1 Attention: Serial 7 subtraction starting at 100 (0/3) _2 Language: Repeat phrase (0/2) _3 Language : Fluency (0/1) _4 Abstraction (0/2) _5 Delayed Recall (0/5) 0 0 0  Orientation (0/6) _6 Total _7 Adjusted Score (based on education)  18           ASSESSMENT/PLAN: [place] Moss is a 88 y.o. year old male    History of stroke involving right posterior limb of internal capsule in December 2020             Vascular risk factor of aging, hypertension, hyperlipidemia, diabetes  Continue Plavix and atorvastatin for secondary stroke prevention measures  Advised to discontinue aspirin as prolonged DAPT not indicated for stroke prevention reasons  Continue close PCP follow-up for aggressive stroke  risk factor management        Dementia             MoCA examination 12/30 (prior 18 /30 11/2021)  Subjectively, both patient and daughter believes memory has been stable  Continue Namenda 10 mg twice daily  Denies previously being on Aricept - can consider initiating in the future if indicated  Increase participation with memory exercises such as crossword puzzles, word search and sudoku.  Continue routine exercise and ensure continued healthy diet, adequate sleep and good management of risk factors             Laboratory evaluation showed no treatable etiology           Worsening gait abnormality, incontinence,             Continue gradual decline in gait  MRI of the cervical spine showed multilevel degenerative changes with potential nerve root compression at left C5 and left C6  Referral placed to neurosurgery for further evaluation     Follow up in 6 months or call earlier if needed   CC:  PCP: [name], FNP    I spent 31 minutes of face-to-face and non-face-to-face time with patient and daughter.  This included previsit chart review, lab review, study review, order entry, electronic health record documentation, patient and daughter education and discussion regarding regarding above diagnoses and answered all other questions to patient and daughter satisfaction   Frann Rider, AGNP-BC  [facility] [address] [place], [place] [address]  Phone [phone] Fax [phone] Note: This document was prepared with digital dictation and possible smart phrase technology. Any transcriptional errors that result from this process are unintentional.

## 2022-05-31 ENCOUNTER — Ambulatory Visit: Payer: Medicare HMO | Admitting: Adult Health

## 2022-05-31 ENCOUNTER — Encounter: Payer: Self-pay | Admitting: Adult Health

## 2022-05-31 VITALS — BP 142/77 | HR 82 | Ht 66.0 in | Wt 144.0 lb

## 2022-05-31 DIAGNOSIS — F0390 Unspecified dementia without behavioral disturbance: Secondary | ICD-10-CM

## 2022-05-31 DIAGNOSIS — R69 Illness, unspecified: Secondary | ICD-10-CM | POA: Diagnosis not present

## 2022-05-31 DIAGNOSIS — R269 Unspecified abnormalities of gait and mobility: Secondary | ICD-10-CM

## 2022-05-31 DIAGNOSIS — G959 Disease of spinal cord, unspecified: Secondary | ICD-10-CM | POA: Diagnosis not present

## 2022-05-31 DIAGNOSIS — I639 Cerebral infarction, unspecified: Secondary | ICD-10-CM | POA: Diagnosis not present

## 2022-05-31 NOTE — Patient Instructions (Signed)
Continue Namenda 10mg  twice daily for cognition  You will be contacted to schedule evaluation by neurosurgery   Continue plavix and atorvastatin for secondary stroke prevention measures - please stop aspirin at this time as both aspirin and plavix are needed for stroke prevention reasons    Follow up in 6 months or call earlier if needed

## 2022-06-17 DIAGNOSIS — N1832 Chronic kidney disease, stage 3b: Secondary | ICD-10-CM | POA: Diagnosis not present

## 2022-06-20 DIAGNOSIS — E872 Acidosis, unspecified: Secondary | ICD-10-CM | POA: Diagnosis not present

## 2022-06-20 DIAGNOSIS — D631 Anemia in chronic kidney disease: Secondary | ICD-10-CM | POA: Diagnosis not present

## 2022-06-20 DIAGNOSIS — E875 Hyperkalemia: Secondary | ICD-10-CM | POA: Diagnosis not present

## 2022-06-20 DIAGNOSIS — R69 Illness, unspecified: Secondary | ICD-10-CM | POA: Diagnosis not present

## 2022-06-20 DIAGNOSIS — E1122 Type 2 diabetes mellitus with diabetic chronic kidney disease: Secondary | ICD-10-CM | POA: Diagnosis not present

## 2022-06-20 DIAGNOSIS — N184 Chronic kidney disease, stage 4 (severe): Secondary | ICD-10-CM | POA: Diagnosis not present

## 2022-06-20 DIAGNOSIS — N281 Cyst of kidney, acquired: Secondary | ICD-10-CM | POA: Diagnosis not present

## 2022-06-20 DIAGNOSIS — N400 Enlarged prostate without lower urinary tract symptoms: Secondary | ICD-10-CM | POA: Diagnosis not present

## 2022-06-20 DIAGNOSIS — I129 Hypertensive chronic kidney disease with stage 1 through stage 4 chronic kidney disease, or unspecified chronic kidney disease: Secondary | ICD-10-CM | POA: Diagnosis not present

## 2022-08-09 DIAGNOSIS — R079 Chest pain, unspecified: Secondary | ICD-10-CM | POA: Diagnosis not present

## 2022-08-30 ENCOUNTER — Telehealth: Payer: Self-pay

## 2022-08-30 NOTE — Telephone Encounter (Signed)
Completed and placed in outbox.  Thank you.

## 2022-08-30 NOTE — Telephone Encounter (Signed)
Adult day care/memory care facility forms received, placed on NP desk for review and signature.

## 2022-08-30 NOTE — Telephone Encounter (Signed)
Forms placed in MR for pick up  

## 2022-10-13 DIAGNOSIS — N1832 Chronic kidney disease, stage 3b: Secondary | ICD-10-CM | POA: Diagnosis not present

## 2022-10-14 DIAGNOSIS — E872 Acidosis, unspecified: Secondary | ICD-10-CM | POA: Diagnosis not present

## 2022-10-14 DIAGNOSIS — E1122 Type 2 diabetes mellitus with diabetic chronic kidney disease: Secondary | ICD-10-CM | POA: Diagnosis not present

## 2022-10-14 DIAGNOSIS — N281 Cyst of kidney, acquired: Secondary | ICD-10-CM | POA: Diagnosis not present

## 2022-10-14 DIAGNOSIS — E875 Hyperkalemia: Secondary | ICD-10-CM | POA: Diagnosis not present

## 2022-10-14 DIAGNOSIS — N400 Enlarged prostate without lower urinary tract symptoms: Secondary | ICD-10-CM | POA: Diagnosis not present

## 2022-10-14 DIAGNOSIS — N184 Chronic kidney disease, stage 4 (severe): Secondary | ICD-10-CM | POA: Diagnosis not present

## 2022-10-14 DIAGNOSIS — D631 Anemia in chronic kidney disease: Secondary | ICD-10-CM | POA: Diagnosis not present

## 2022-10-14 DIAGNOSIS — I129 Hypertensive chronic kidney disease with stage 1 through stage 4 chronic kidney disease, or unspecified chronic kidney disease: Secondary | ICD-10-CM | POA: Diagnosis not present

## 2022-10-14 DIAGNOSIS — R69 Illness, unspecified: Secondary | ICD-10-CM | POA: Diagnosis not present

## 2022-10-14 DIAGNOSIS — E870 Hyperosmolality and hypernatremia: Secondary | ICD-10-CM | POA: Diagnosis not present

## 2022-10-21 DIAGNOSIS — I10 Essential (primary) hypertension: Secondary | ICD-10-CM | POA: Diagnosis not present

## 2022-10-21 DIAGNOSIS — Z6824 Body mass index (BMI) 24.0-24.9, adult: Secondary | ICD-10-CM | POA: Diagnosis not present

## 2022-10-21 DIAGNOSIS — E1169 Type 2 diabetes mellitus with other specified complication: Secondary | ICD-10-CM | POA: Diagnosis not present

## 2022-11-08 ENCOUNTER — Other Ambulatory Visit: Payer: Self-pay

## 2022-11-08 ENCOUNTER — Encounter (HOSPITAL_BASED_OUTPATIENT_CLINIC_OR_DEPARTMENT_OTHER): Payer: Self-pay

## 2022-11-08 ENCOUNTER — Emergency Department (HOSPITAL_BASED_OUTPATIENT_CLINIC_OR_DEPARTMENT_OTHER)
Admission: EM | Admit: 2022-11-08 | Discharge: 2022-11-08 | Disposition: A | Discharge status: Home / Self Care | Payer: Medicare HMO | Attending: Emergency Medicine | Admitting: Emergency Medicine

## 2022-11-08 ENCOUNTER — Emergency Department (HOSPITAL_BASED_OUTPATIENT_CLINIC_OR_DEPARTMENT_OTHER): Payer: Medicare HMO

## 2022-11-08 DIAGNOSIS — S0081XA Abrasion of other part of head, initial encounter: Secondary | ICD-10-CM | POA: Insufficient documentation

## 2022-11-08 DIAGNOSIS — W19XXXA Unspecified fall, initial encounter: Secondary | ICD-10-CM

## 2022-11-08 DIAGNOSIS — Z79899 Other long term (current) drug therapy: Secondary | ICD-10-CM | POA: Insufficient documentation

## 2022-11-08 DIAGNOSIS — Z743 Need for continuous supervision: Secondary | ICD-10-CM | POA: Diagnosis not present

## 2022-11-08 DIAGNOSIS — W010XXA Fall on same level from slipping, tripping and stumbling without subsequent striking against object, initial encounter: Secondary | ICD-10-CM | POA: Diagnosis not present

## 2022-11-08 DIAGNOSIS — S0990XA Unspecified injury of head, initial encounter: Secondary | ICD-10-CM | POA: Diagnosis not present

## 2022-11-08 DIAGNOSIS — Z7902 Long term (current) use of antithrombotics/antiplatelets: Secondary | ICD-10-CM | POA: Insufficient documentation

## 2022-11-08 DIAGNOSIS — R609 Edema, unspecified: Secondary | ICD-10-CM | POA: Diagnosis not present

## 2022-11-08 DIAGNOSIS — R58 Hemorrhage, not elsewhere classified: Secondary | ICD-10-CM | POA: Diagnosis not present

## 2022-11-08 DIAGNOSIS — S199XXA Unspecified injury of neck, initial encounter: Secondary | ICD-10-CM | POA: Diagnosis not present

## 2022-11-08 DIAGNOSIS — S0993XA Unspecified injury of face, initial encounter: Secondary | ICD-10-CM | POA: Diagnosis not present

## 2022-11-08 NOTE — Discharge Instructions (Addendum)
You were seen in the emergency department for a fall. Thankfully all of your CT scans were negative for any fractures or obvious bleeding in the head. You should plan to follow up with your primary care provider within the next 3 days for further evaluation.You can manage any pain with Tylenol/Ibuprofen as tolerated. You should also plan to follow up with your dentist for further evaluation but currently there does not appear to be any problems with your teeth on physical exam.

## 2022-11-08 NOTE — ED Notes (Signed)
Pt is CAOx4, able to answer all questions appropriately... Has a hx of dementia according to EMS... Bleeding controlled.Marland KitchenMarland Kitchen

## 2022-11-08 NOTE — ED Notes (Signed)
Iona Beard (family friend) for updates: [phone]

## 2022-11-08 NOTE — ED Provider Notes (Signed)
[facility] Provider Note   CSN: [id number] Arrival date & time: 11/08/22  1730     History Chief Complaint  Patient presents with   Erik Moss is a 89 y.o. male.   Fall Pertinent negatives include no chest pain, no abdominal pain and no shortness of breath.  Patient presents emergency department with complaints of a mechanical fall. Patient currently taking Clopidogrel.  Patient denies that he had any loss of consciousness or a syncopal event causing fall.  He reports that he tripped when he was try to get onto the bus and hit his face directly into the stairs walking into the box.  Patient denies any headaches, nausea, vomiting, diarrhea, confusion.  Patient is primarily concerned about his dentition as he has recently had some dental work performed and did notice that he had some significant bleeding coming from his mouth that has not appeared to stop.     Home Medications Prior to Admission medications   Medication Sig Start Date End Date Taking? Authorizing Provider  amLODipine (NORVASC) 5 MG tablet Take 5 mg by mouth daily.    [provider]  atorvastatin (LIPITOR) 40 MG tablet Take 1 tablet (40 mg total) by mouth daily. 10/10/19 09/24/20  [place] [name], DO  blood glucose meter kit and supplies Dispense based on patient and insurance preference. Use up to four times daily as directed. (FOR ICD-10 E10.9, E11.9). 09/25/20   [name], MD  Cholecalciferol (D3 PO) Take 1 capsule by mouth daily.    [provider]  clopidogrel (PLAVIX) 75 MG tablet Take 75 mg by mouth daily. 11/05/19   [provider]  diphenhydrAMINE (BENADRYL) 12.5 MG/5ML elixir Take 6.25 mg by mouth 4 (four) times daily as needed for allergies.    [provider]  docusate sodium (COLACE) 100 MG capsule Take 100 mg by mouth daily.    [provider]  glimepiride (AMARYL) 2 MG tablet Take 2 mg by mouth in the morning and at  bedtime.    [provider]  memantine (NAMENDA) 10 MG tablet Take 1 tablet (10 mg total) by mouth 2 (two) times daily. 11/30/21   [name], MD  mirabegron ER (MYRBETRIQ) 50 MG TB24 tablet Take 50 mg by mouth daily.    [provider]  sitaGLIPtin (JANUVIA) 25 MG tablet Take 25 mg by mouth daily.    [provider]  sodium bicarbonate 650 MG tablet Take 650 mg by mouth 2 (two) times daily.    [provider]      Allergies    Patient has no known allergies.    Review of Systems   Review of Systems  Constitutional:  Negative for activity change, appetite change and fever.  Respiratory:  Negative for cough, choking and shortness of breath.   Cardiovascular:  Negative for chest pain.  Gastrointestinal:  Negative for abdominal pain, nausea and vomiting.  Musculoskeletal:  Negative for gait problem, joint swelling and neck stiffness.  All other systems reviewed and are negative.   Physical Exam Updated Vital Signs BP (!) 148/69 (BP Location: Right Arm)   Pulse 79   Resp 18   SpO2 100%  Physical Exam  ED Results / Procedures / Treatments   Labs (all labs ordered are listed, but only abnormal results are displayed) Labs Reviewed - No data to display  EKG None  Radiology CT Head Wo Contrast  Result Date: 11/08/2022 CLINICAL DATA:  Head trauma,  moderate-severe; Neck trauma (Age >= 65y); Facial trauma, blunt. Fall EXAM: CT HEAD WITHOUT CONTRAST CT MAXILLOFACIAL WITHOUT CONTRAST CT CERVICAL SPINE WITHOUT CONTRAST TECHNIQUE: Multidetector CT imaging of the head, cervical spine, and maxillofacial structures were performed using the standard protocol without intravenous contrast. Multiplanar CT image reconstructions of the cervical spine and maxillofacial structures were also generated. RADIATION DOSE REDUCTION: This exam was performed according to the departmental dose-optimization program which includes automated exposure control, adjustment of the mA  and/or kV according to patient size and/or use of iterative reconstruction technique. COMPARISON:  MRI head 08/23/2021 FINDINGS: CT HEAD FINDINGS Brain: Cerebral ventricle sizes are concordant with the degree of cerebral volume loss. Patchy and confluent areas of decreased attenuation are noted throughout the deep and periventricular white matter of the cerebral hemispheres bilaterally, compatible with chronic microvascular ischemic disease. Chronic right basal ganglia lacunar infarction. No evidence of large-territorial acute infarction. No parenchymal hemorrhage. No mass lesion. No extra-axial collection. No mass effect or midline shift. No hydrocephalus. Basilar cisterns are patent. Vascular: No hyperdense vessel. Atherosclerotic calcifications are present within the cavernous internal carotid and vertebral arteries. Skull: No acute fracture or focal lesion. Other: None. CT MAXILLOFACIAL FINDINGS Osseous: No fracture or mandibular dislocation. No destructive process. Sinuses/Orbits: Right ethmoid sinus mucosal thickening. Paranasal sinuses and mastoid air cells are clear. Bilateral lens replacement. Otherwise the orbits are unremarkable. Soft tissues: Negative. CT CERVICAL SPINE FINDINGS Alignment: Normal. Skull base and vertebrae: Multilevel severe degenerative changes of the spine. Associated moderate to severe osseous neural foraminal stenosis at the right C5-C6 level. No associated severe osseous central canal stenosis. No acute fracture. No aggressive appearing focal osseous lesion or focal pathologic process. Soft tissues and spinal canal: No prevertebral fluid or swelling. No visible canal hematoma. Upper chest: Unremarkable. Other: Atherosclerotic plaque of the aortic arch. Atherosclerotic plaque of the carotid arteries within the neck. Old healed rib fractures. IMPRESSION: 1. No acute intracranial abnormality. 2. No acute displaced facial fracture. 3. No acute displaced fracture or traumatic listhesis of  the cervical spine. Electronically Signed   By: Iven Finn M.D.   On: 11/08/2022 19:19   CT Maxillofacial WO CM  Result Date: 11/08/2022 CLINICAL DATA:  Head trauma, moderate-severe; Neck trauma (Age >= 65y); Facial trauma, blunt. Fall EXAM: CT HEAD WITHOUT CONTRAST CT MAXILLOFACIAL WITHOUT CONTRAST CT CERVICAL SPINE WITHOUT CONTRAST TECHNIQUE: Multidetector CT imaging of the head, cervical spine, and maxillofacial structures were performed using the standard protocol without intravenous contrast. Multiplanar CT image reconstructions of the cervical spine and maxillofacial structures were also generated. RADIATION DOSE REDUCTION: This exam was performed according to the departmental dose-optimization program which includes automated exposure control, adjustment of the mA and/or kV according to patient size and/or use of iterative reconstruction technique. COMPARISON:  MRI head 08/23/2021 FINDINGS: CT HEAD FINDINGS Brain: Cerebral ventricle sizes are concordant with the degree of cerebral volume loss. Patchy and confluent areas of decreased attenuation are noted throughout the deep and periventricular white matter of the cerebral hemispheres bilaterally, compatible with chronic microvascular ischemic disease. Chronic right basal ganglia lacunar infarction. No evidence of large-territorial acute infarction. No parenchymal hemorrhage. No mass lesion. No extra-axial collection. No mass effect or midline shift. No hydrocephalus. Basilar cisterns are patent. Vascular: No hyperdense vessel. Atherosclerotic calcifications are present within the cavernous internal carotid and vertebral arteries. Skull: No acute fracture or focal lesion. Other: None. CT MAXILLOFACIAL FINDINGS Osseous: No fracture or mandibular dislocation. No destructive process. Sinuses/Orbits: Right ethmoid sinus mucosal thickening. Paranasal sinuses and  mastoid air cells are clear. Bilateral lens replacement. Otherwise the orbits are unremarkable.  Soft tissues: Negative. CT CERVICAL SPINE FINDINGS Alignment: Normal. Skull base and vertebrae: Multilevel severe degenerative changes of the spine. Associated moderate to severe osseous neural foraminal stenosis at the right C5-C6 level. No associated severe osseous central canal stenosis. No acute fracture. No aggressive appearing focal osseous lesion or focal pathologic process. Soft tissues and spinal canal: No prevertebral fluid or swelling. No visible canal hematoma. Upper chest: Unremarkable. Other: Atherosclerotic plaque of the aortic arch. Atherosclerotic plaque of the carotid arteries within the neck. Old healed rib fractures. IMPRESSION: 1. No acute intracranial abnormality. 2. No acute displaced facial fracture. 3. No acute displaced fracture or traumatic listhesis of the cervical spine. Electronically Signed   By: Iven Finn M.D.   On: 11/08/2022 19:19   CT Cervical Spine Wo Contrast  Result Date: 11/08/2022 CLINICAL DATA:  Head trauma, moderate-severe; Neck trauma (Age >= 65y); Facial trauma, blunt. Fall EXAM: CT HEAD WITHOUT CONTRAST CT MAXILLOFACIAL WITHOUT CONTRAST CT CERVICAL SPINE WITHOUT CONTRAST TECHNIQUE: Multidetector CT imaging of the head, cervical spine, and maxillofacial structures were performed using the standard protocol without intravenous contrast. Multiplanar CT image reconstructions of the cervical spine and maxillofacial structures were also generated. RADIATION DOSE REDUCTION: This exam was performed according to the departmental dose-optimization program which includes automated exposure control, adjustment of the mA and/or kV according to patient size and/or use of iterative reconstruction technique. COMPARISON:  MRI head 08/23/2021 FINDINGS: CT HEAD FINDINGS Brain: Cerebral ventricle sizes are concordant with the degree of cerebral volume loss. Patchy and confluent areas of decreased attenuation are noted throughout the deep and periventricular white matter of the  cerebral hemispheres bilaterally, compatible with chronic microvascular ischemic disease. Chronic right basal ganglia lacunar infarction. No evidence of large-territorial acute infarction. No parenchymal hemorrhage. No mass lesion. No extra-axial collection. No mass effect or midline shift. No hydrocephalus. Basilar cisterns are patent. Vascular: No hyperdense vessel. Atherosclerotic calcifications are present within the cavernous internal carotid and vertebral arteries. Skull: No acute fracture or focal lesion. Other: None. CT MAXILLOFACIAL FINDINGS Osseous: No fracture or mandibular dislocation. No destructive process. Sinuses/Orbits: Right ethmoid sinus mucosal thickening. Paranasal sinuses and mastoid air cells are clear. Bilateral lens replacement. Otherwise the orbits are unremarkable. Soft tissues: Negative. CT CERVICAL SPINE FINDINGS Alignment: Normal. Skull base and vertebrae: Multilevel severe degenerative changes of the spine. Associated moderate to severe osseous neural foraminal stenosis at the right C5-C6 level. No associated severe osseous central canal stenosis. No acute fracture. No aggressive appearing focal osseous lesion or focal pathologic process. Soft tissues and spinal canal: No prevertebral fluid or swelling. No visible canal hematoma. Upper chest: Unremarkable. Other: Atherosclerotic plaque of the aortic arch. Atherosclerotic plaque of the carotid arteries within the neck. Old healed rib fractures. IMPRESSION: 1. No acute intracranial abnormality. 2. No acute displaced facial fracture. 3. No acute displaced fracture or traumatic listhesis of the cervical spine. Electronically Signed   By: Iven Finn M.D.   On: 11/08/2022 19:19    Procedures Procedures   Medications Ordered in ED Medications - No data to display  ED Course/ Medical Decision Making/ A&P   {   Click here for ABCD2, HEART and other calculators                         Medical Decision Making Amount and/or  Complexity of Data Reviewed Radiology: ordered.   This patient presents to  the ED for concern of fall. Differential diagnosis includes skin laceration, brain bleed, facial fracture, syncope   Imaging Studies ordered:  I ordered imaging studies including CT head, maxillofacial, and neck  I independently visualized and interpreted imaging which showed no obvious bleeding or fractures noted I agree with the radiologist interpretation   Medicines ordered and prescription drug management:  I have reviewed the patients home medicines and have made adjustments as needed   Problem List / ED Course:  Patient presented to the ED after a fall. Patient reports that he fell walking into the bus stairs.  Per patient's account, he did not lose consciousness walking up the steps but had a mechanical fall.  Advised patient that based on all negative imaging of his head, face, neck, he is safe to discharge home but to be monitored by family if there is any significant changes in his cognitive abilities for possible further evaluation here in the emergency department.  Patient and family verbalized understanding all return precautions and denies any further questions at the end of this encounter.    Final Clinical Impression(s) / ED Diagnoses Final diagnoses:  Abrasion of face, initial encounter  Fall, initial encounter    Rx / DC Orders ED Discharge Orders     None         Luvenia Heller, PA-C 38/32/91 [id number]    Campbell Stall P, DO 06/00/45 0001

## 2022-11-08 NOTE — ED Notes (Signed)
Discharge paperwork given and verbally understood. 

## 2022-11-08 NOTE — ED Triage Notes (Signed)
Pt via [facility] from "adult day care facility," was leaving to get on bus. Tripped & hit face on concrete, abrasion/lac noted to upper lip, swelling to lower. Dentation intact, pt denies additional complaint.   150/60 HR 90

## 2022-12-01 NOTE — Progress Notes (Signed)
[name] [address] [place]. [place] [address] [phone]       OFFICE FOLLOW UP NOTE  [name] Date of Birth:  04-22-1933 Medical Record Number:  [id number]   Reason for visit: hx of stroke, dementia, imbalance    SUBJECTIVE:   CHIEF COMPLAINT:  Chief Complaint  Patient presents with   Follow-up    Rm 3 with daughter Erik Moss  Pt is well and stable, no new concerns per daughter     HPI:    Erik Moss is a 89 year old male, seen in request by his PCP Erik Loader, NP for evaluation of transient confusion with initial evaluation by Dr. Krista Moss on 02/23/2021.   I reviewed and summarized the referring note.  Past medical history Hypertension Hyperlipidemia Diabetes Prostate hypertrophy   Consult visit 02/23/2021 Dr. Krista Moss: He is retired [employer], he retired at age 69s, since then, he completed total of 9 masters degree, involving museum, [place] history, creative writing English, Air cabin crew, international relationship management,   Prior to 2020, he lives alone, moved to his daughter during pandemic, was noted to have slow worsening memory loss,    He was admitted to hospital in December 2020, was noted to have acute onset left-sided weakness, dragging his left leg while walking   MRI of the brain on October 08, 2019 showed small region of acute infarction involving right lateral thalamus, posterior limb of internal capsule, was also noted to have poorly controlled diabetes, A1c up to 10.5,   Extensive evaluation showed no significant large vessel disease, no significant abnormality on echocardiogram,   He was started on Plavix 75 mg daily, also taking aspirin 81 mg daily, daughter began to help in managing his medications   He recovered almost back to his baseline, he wished to register for college class, but was denied, he was noted to have worsening memory loss, he still enjoying reading newspaper, walk few  miles each day,   His mother lived to more than 74 years old, began to develop mild memory loss in her 93s   In January 26, 2021, woke up in the morning, he was noted by his daughter to be slow reaction, mildly slurred speech, confused, was taken to the emergency room, MRI of the brain showed no acute abnormality   UPDATE Nov 30 2021 Dr. Krista Moss: He lives with his daughter now, still likes to read newspaper, also exercise regularly, his memory continues to decline, MoCA examination is 18/30 today, daughter also reported that he has increased gait abnormality, occasionally bowel and bladder incontinence.   Personally reviewed MRI of brain on Aug 23 2021:1. No evidence of acute intracranial abnormality, including acute infarct.2. Similar mild-to-moderate chronic microvascular disease and remote lacunar infarcts. He is tolerating Aricept 10 mg daily Laboratory evaluations October 2022: CBC showed hemoglobin of 11.4, CMP, creatinine 2.78, negative alcohol, negative UDS   UPDATE 05/26/2022 JM:   He is accompanied today by his daughter, follow-up.  Overall stable since prior visit without any new stroke/TIA symptoms.  Compliant on Plavix and atorvastatin, denies side effects.  Also reports continuing on aspirin 81 mg daily for stroke prevention purposes Blood pressure 142/77 - does not routinely monitor at home Routinely follows with PCP for stroke risk factor management with recent visit yesterday  Both patient and daughter report cognition has been stable since prior visit Remains on Namenda 10 mg twice daily, denies side effects. Per daughter, was never on Aricept previously  [facility] 12/30 (  prior 18/30 11/2021)  MRI of cervical spine showed C4-5 spinal stenosis with possible nerve root compression on the left. Daughter believes his gait continues to gradually decline.  He does try to stay active and ambulates around his yard.  Does not use any assistive device, denies any recent falls.  Reports  previously working with PT at daycare program but daughter is in the process of looking for a new program as they significantly increase their rates.  Denies any hand or feet numbness/tingling.  Occasional bowel and bladder incontinence, unchanged since prior visit.  Denies any neck pain.  No further concerns at this time    MRI CERVICAL SPINE 12/11/2021 IMPRESSION: This MRI of the cervical spine without contrast shows the following: 1.   Abnormal signal within the spinal cord at C4 to C4-C5 involving the anterior and posterior horns of the spinal cord and small T2 hyperintense foci to the right and posterolaterally to the right adjacent to C5.  These are consistent with myelopathic signal changes, likely related to the spinal stenosis and anterolisthesis at C4-C5.  Additionally at this level there is moderately severe left foraminal narrowing with potential for left C5 nerve root compression. 2.   At C3-C4, there are degenerative changes causing mild spinal stenosis and moderate bilateral foraminal narrowing but no definite nerve root compression. 3.   At C5-C6, there are degenerative changes causing mild spinal stenosis and moderately severe left foraminal narrowing and moderate right foraminal narrowing.  There is potential for left C6 nerve root compression. 4.   At C6-C7, there are degenerative changes causing moderate spinal stenosis and moderate bilateral foraminal narrowing but no definite nerve root compression. 5.   At C7-T1, there is anterolisthesis and other degenerative changes causing mild spinal stenosis and moderate bilateral foraminal narrowing but no definite nerve root compression. 6.   Degenerative changes at C2-C3 and T1-T2 do not lead to spinal stenosis.  There is foraminal narrowing but no definite nerve root compression.    Update 12/05/2022 JM: Patient returns for 18-monthfollow-up accompanied by his daughter.  Stable from stroke standpoint, no new stroke/TIA symptoms.   Remains on Plavix and atorvastatin.  Blood pressure today 130/67.  Routinely follows with PCP.  Cognition has been stable since prior visit, continues to go to [facility] daily and actively participates in social activities there. 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  [facility] unable to complete top portion therefore switched to MMSE) Remains on Namenda 182mBID, denies side effects  Continued gait impairment/imbalance but overall stable since prior visit.  Has been participating in dancing and exercise classes at [employer] has been helpful. Was not seen by neurosurgery as previously discussed as stable.      ROS:   14 system review of systems performed and negative with exception of those listed in HPI  PMH:  Past Medical History:  Diagnosis Date   BPH (benign prostatic hyperplasia) 10/08/2019   Diabetes mellitus without complication ([place]   Hyperlipidemia 10/08/2019   Hypertension 10/08/2019   Renal disease    [facility]   TIA (transient ischemic attack)    Type 2 diabetes mellitus (HCR[place]12/15/2020    PSH:  Past Surgical History:  Procedure Laterality Date   BUBBLE STUDY  10/10/2019   Procedure: BUBBLE STUDY;  Surgeon: O'Geralynn RileMD;  [place] Service: Cardiology;;   TEE WITHOUT CARDIOVERSION N/A 10/10/2019   Procedure: TRANSESOPHAGEAL ECHOCARDIOGRAM (TEE);  Surgeon: O'Neal,  Cassie Freer, MD;  Location: [place];  Service: Cardiology;  Laterality: N/A;    Social History:  Social History   Socioeconomic History   Marital status: Divorced    Spouse name: Not on file   Number of children: 1   Years of education: collge   Highest education level: Master's degree (e.g., MA, MS, MEng, MEd, MSW, MBA)  Occupational History   Occupation: Retired  Tobacco Use   Smoking status: Never   Smokeless tobacco: Never  Substance and Sexual Activity   Alcohol use: Never   Drug use: Never   Sexual  activity: Not on file  Other Topics Concern   Not on file  Social History Narrative   11/19/19 lives with dgtr, grand dgtr.   Left-handed.   Reports having nine masters' degrees.   1 cup tea each morning.   Social Determinants of Health   Financial Resource Strain: Not on file  Food Insecurity: Not on file  Transportation Needs: Not on file  Physical Activity: Not on file  Stress: Not on file  Social Connections: Not on file  Intimate Partner Violence: Not on file    Family History:  Family History  Problem Relation Age of Onset   Other Mother        Passed from "old age"   Other Father        Accident    Medications:   Current Outpatient Medications on File Prior to Visit  Medication Sig Dispense Refill   amLODipine (NORVASC) 5 MG tablet Take 5 mg by mouth daily.     atorvastatin (LIPITOR) 40 MG tablet Take 1 tablet (40 mg total) by mouth daily. 90 tablet 0   blood glucose meter kit and supplies Dispense based on patient and insurance preference. Use up to four times daily as directed. (FOR ICD-10 E10.9, E11.9). 1 each 0   Cholecalciferol (D3 PO) Take 1 capsule by mouth daily.     clopidogrel (PLAVIX) 75 MG tablet Take 75 mg by mouth daily.     diphenhydrAMINE (BENADRYL) 12.5 MG/5ML elixir Take 6.25 mg by mouth 4 (four) times daily as needed for allergies.     docusate sodium (COLACE) 100 MG capsule Take 100 mg by mouth daily.     glimepiride (AMARYL) 2 MG tablet Take 2 mg by mouth in the morning and at bedtime.     memantine (NAMENDA) 10 MG tablet Take 1 tablet (10 mg total) by mouth 2 (two) times daily. 180 tablet 4   mirabegron ER (MYRBETRIQ) 50 MG TB24 tablet Take 50 mg by mouth daily.     sitaGLIPtin (JANUVIA) 25 MG tablet Take 25 mg by mouth daily.     sodium bicarbonate 650 MG tablet Take 650 mg by mouth 2 (two) times daily.     No current facility-administered medications on file prior to visit.    Allergies:  No Known Allergies    OBJECTIVE:  Physical  Exam  Vitals:   12/05/22 1039  BP: 130/67  Pulse: 95  Weight: 139 lb (63 kg)  Height: 5' 6"$  (1.676 m)    Body mass index is 22.44 kg/m. No results found.   General: well developed, well nourished, frail very pleasant elderly African-American male, seated, in no evident distress Head: head normocephalic and atraumatic.   Neck: supple with no carotid or supraclavicular bruits Cardiovascular: regular rate and rhythm, no murmurs Musculoskeletal: no deformity Skin:  no rash/petichiae Vascular:  Normal pulses all extremities   Neurologic Exam Mental Status: Awake  and fully alert.  Fluent speech and language.  Oriented to place and time. Recent and remote memory intact. Attention span, concentration and fund of knowledge appropriate. Mood and affect appropriate.  Cranial Nerves: Pupils equal, briskly reactive to light. Extraocular movements full without nystagmus. Visual fields full to confrontation.  HOH bilaterally. Facial sensation intact. Face, tongue, palate moves normally and symmetrically.  Motor: Normal bulk and tone. Normal strength in all tested extremity muscles Sensory.: intact to touch , pinprick , position and vibratory sensation.  Coordination: Rapid alternating movements normal in all extremities. Finger-to-nose and heel-to-shin performed accurately bilaterally. Gait and Station: Arises from chair without difficulty. Stance is stooped. Gait demonstrates decreased stride length and step height bilaterally with mild unsteadiness and occasional veering towards the right.  No assistive device. Tandem walk and heel toe not attempted Reflexes: 1+ and symmetric. Toes downgoing.      05/31/2022    7:37 AM 11/30/2021    7:30 AM 02/25/2021    9:00 PM  Montreal Cognitive Assessment   Visuospatial/ Executive (0/5) 0 3 1  Naming (0/3) 0 1 3  Attention: Read list of digits (0/2) 2 2 2  $ Attention: Read list of letters (0/1) 1 1 1  $ Attention: Serial 7 subtraction starting at 100 (0/3) 2  3 3  $ Language: Repeat phrase (0/2) 1 1 2  $ Language : Fluency (0/1) 1 1 1  $ Abstraction (0/2) 1 1 2  $ Delayed Recall (0/5) 0 0 0  Orientation (0/6) 4 5 6  $ Total 12 18 21  $ Adjusted Score (based on education)  18       12/05/2022   10:54 AM  MMSE - Mini Mental State Exam  Orientation to time 5  Orientation to Place 3  Registration 3  Attention/ Calculation 1  Recall 0  Language- name 2 objects 2  Language- repeat 1  Language- follow 3 step command 2  Language- read & follow direction 1  Write a sentence 1  Copy design 0  Total score 19          ASSESSMENT/PLAN: Pam Seats Carre is a 89 y.o. year old male    History of stroke involving right posterior limb of internal capsule in December 2020             Vascular risk factor of aging, hypertension, hyperlipidemia, diabetes  Continue Plavix and atorvastatin for secondary stroke prevention measures  Continue close PCP follow-up for aggressive stroke risk factor management        Dementia             MMSE 19/30 11/2022 MoCA examination 12/30 05/2022 (prior 18 /30 11/2021)  Subjectively, both patient and daughter believes memory has been stable  Continue Namenda 10 mg twice daily- refill provided   Denies previously being on Aricept - can consider initiating in the future if indicated but will hold off as memory has been nonprogressive  Increase participation with memory exercises such as crossword puzzles, word search and sudoku.  Continue routine exercise and ensure continued healthy diet, adequate sleep and good management of risk factors             Laboratory evaluation showed no treatable etiology           Worsening gait abnormality, incontinence,             stable since prior visit  Continue to participate in exercise and dancing classes at [employer]  MRI of the cervical spine showed multilevel degenerative changes with potential  nerve root compression at left C5 and left C6 - can hold off on neurosurgery  evaluation at this time as stable but if any progression, would recommend evaluation at that time       Follow up in 1 year or call earlier if needed   CC:  PCP: Erik Loader, FNP    I spent 34 minutes of face-to-face and non-face-to-face time with patient and daughter.  This included previsit chart review, lab review, study review, order entry, electronic health record documentation, patient and daughter education and discussion regarding regarding above diagnoses and answered all other questions to patient and daughter satisfaction  Frann Rider, AGNP-BC  [facility] [address] [place], [place] [address]  Phone [phone] Fax [phone] Note: This document was prepared with digital dictation and possible smart phrase technology. Any transcriptional errors that result from this process are unintentional.

## 2022-12-05 ENCOUNTER — Ambulatory Visit: Payer: Medicare HMO | Admitting: Adult Health

## 2022-12-05 ENCOUNTER — Encounter: Payer: Self-pay | Admitting: Adult Health

## 2022-12-05 VITALS — BP 130/67 | HR 95 | Ht 66.0 in | Wt 139.0 lb

## 2022-12-05 DIAGNOSIS — R269 Unspecified abnormalities of gait and mobility: Secondary | ICD-10-CM

## 2022-12-05 DIAGNOSIS — I639 Cerebral infarction, unspecified: Secondary | ICD-10-CM

## 2022-12-05 DIAGNOSIS — F0390 Unspecified dementia without behavioral disturbance: Secondary | ICD-10-CM | POA: Diagnosis not present

## 2022-12-05 DIAGNOSIS — R69 Illness, unspecified: Secondary | ICD-10-CM | POA: Diagnosis not present

## 2022-12-05 MED ORDER — MEMANTINE HCL 10 MG PO TABS: 10.0000 mg | ORAL_TABLET | Freq: Two times a day (BID) | ORAL | 3 refills | Status: DC

## 2022-12-05 NOTE — Patient Instructions (Addendum)
Your Plan:  Continue Namenda 10 mg twice daily  Continue to follow with PCP for aggressive stroke risk factor management and monitoring as well as ongoing use of Plavix and atorvastatin  Continue to participate in social activities and exercise classes at [employer]     Follow up in 1 year or call earlier if needed     Thank you for coming to see [place] at [facility]. I hope we have been able to provide you high quality care today.  You may receive a patient satisfaction survey over the next few weeks. We would appreciate your feedback and comments so that we may continue to improve ourselves and the health of our patients.

## 2023-02-06 DIAGNOSIS — R69 Illness, unspecified: Secondary | ICD-10-CM | POA: Diagnosis not present

## 2023-02-06 DIAGNOSIS — N400 Enlarged prostate without lower urinary tract symptoms: Secondary | ICD-10-CM | POA: Diagnosis not present

## 2023-02-06 DIAGNOSIS — E872 Acidosis, unspecified: Secondary | ICD-10-CM | POA: Diagnosis not present

## 2023-02-06 DIAGNOSIS — E875 Hyperkalemia: Secondary | ICD-10-CM | POA: Diagnosis not present

## 2023-02-06 DIAGNOSIS — I129 Hypertensive chronic kidney disease with stage 1 through stage 4 chronic kidney disease, or unspecified chronic kidney disease: Secondary | ICD-10-CM | POA: Diagnosis not present

## 2023-02-06 DIAGNOSIS — E1122 Type 2 diabetes mellitus with diabetic chronic kidney disease: Secondary | ICD-10-CM | POA: Diagnosis not present

## 2023-02-06 DIAGNOSIS — N281 Cyst of kidney, acquired: Secondary | ICD-10-CM | POA: Diagnosis not present

## 2023-02-06 DIAGNOSIS — N184 Chronic kidney disease, stage 4 (severe): Secondary | ICD-10-CM | POA: Diagnosis not present

## 2023-02-06 DIAGNOSIS — D631 Anemia in chronic kidney disease: Secondary | ICD-10-CM | POA: Diagnosis not present

## 2023-02-06 DIAGNOSIS — E870 Hyperosmolality and hypernatremia: Secondary | ICD-10-CM | POA: Diagnosis not present

## 2023-02-28 DIAGNOSIS — E782 Mixed hyperlipidemia: Secondary | ICD-10-CM | POA: Diagnosis not present

## 2023-02-28 DIAGNOSIS — E1169 Type 2 diabetes mellitus with other specified complication: Secondary | ICD-10-CM | POA: Diagnosis not present

## 2023-02-28 DIAGNOSIS — E1122 Type 2 diabetes mellitus with diabetic chronic kidney disease: Secondary | ICD-10-CM | POA: Diagnosis not present

## 2023-02-28 DIAGNOSIS — F0390 Unspecified dementia without behavioral disturbance: Secondary | ICD-10-CM | POA: Diagnosis not present

## 2023-02-28 DIAGNOSIS — N184 Chronic kidney disease, stage 4 (severe): Secondary | ICD-10-CM | POA: Diagnosis not present

## 2023-02-28 DIAGNOSIS — R350 Frequency of micturition: Secondary | ICD-10-CM | POA: Diagnosis not present

## 2023-02-28 DIAGNOSIS — I10 Essential (primary) hypertension: Secondary | ICD-10-CM | POA: Diagnosis not present

## 2023-03-22 DIAGNOSIS — W19XXXA Unspecified fall, initial encounter: Secondary | ICD-10-CM | POA: Diagnosis not present

## 2023-03-22 DIAGNOSIS — I10 Essential (primary) hypertension: Secondary | ICD-10-CM | POA: Diagnosis not present

## 2023-03-22 DIAGNOSIS — R2681 Unsteadiness on feet: Secondary | ICD-10-CM | POA: Diagnosis not present

## 2023-03-22 DIAGNOSIS — E1165 Type 2 diabetes mellitus with hyperglycemia: Secondary | ICD-10-CM | POA: Diagnosis not present

## 2023-03-22 DIAGNOSIS — N189 Chronic kidney disease, unspecified: Secondary | ICD-10-CM | POA: Diagnosis not present

## 2023-03-22 DIAGNOSIS — E7849 Other hyperlipidemia: Secondary | ICD-10-CM | POA: Diagnosis not present

## 2023-03-22 DIAGNOSIS — N3281 Overactive bladder: Secondary | ICD-10-CM | POA: Diagnosis not present

## 2023-03-22 DIAGNOSIS — F03B0 Unspecified dementia, moderate, without behavioral disturbance, psychotic disturbance, mood disturbance, and anxiety: Secondary | ICD-10-CM | POA: Diagnosis not present

## 2023-04-20 DIAGNOSIS — L600 Ingrowing nail: Secondary | ICD-10-CM | POA: Diagnosis not present

## 2023-04-20 DIAGNOSIS — E1165 Type 2 diabetes mellitus with hyperglycemia: Secondary | ICD-10-CM | POA: Diagnosis not present

## 2023-04-20 DIAGNOSIS — Z79899 Other long term (current) drug therapy: Secondary | ICD-10-CM | POA: Diagnosis not present

## 2023-04-25 ENCOUNTER — Ambulatory Visit: Payer: Medicare HMO | Attending: Internal Medicine | Admitting: Physical Therapy

## 2023-04-25 DIAGNOSIS — R2689 Other abnormalities of gait and mobility: Secondary | ICD-10-CM | POA: Insufficient documentation

## 2023-04-25 DIAGNOSIS — R2681 Unsteadiness on feet: Secondary | ICD-10-CM | POA: Insufficient documentation

## 2023-04-25 NOTE — Therapy (Signed)
OUTPATIENT PHYSICAL THERAPY NEURO EVALUATION   Patient Name: Erik Moss MRN: [id number] DOB:[birth year], 89 y.o., male Today's Date: 04/26/2023   PCP: Harvest Forest, MD REFERRING PROVIDER: Harvest Forest, MD  END OF SESSION:  PT End of Session - 04/26/23 1414     Visit Number 1    Number of Visits 5    Date for PT Re-Evaluation 06/02/23   pushed out due to schedule availability   Authorization Type [employer] Medicare    PT Start Time 0935    PT Stop Time 1018    PT Time Calculation (min) 43 min    Equipment Utilized During Treatment Gait belt    Activity Tolerance Patient tolerated treatment well    Behavior During Therapy [facility] for tasks assessed/performed             Past Medical History:  Diagnosis Date   BPH (benign prostatic hyperplasia) 10/08/2019   Diabetes mellitus without complication (HCC)    Hyperlipidemia 10/08/2019   Hypertension 10/08/2019   Renal disease    Stroke [facility])    TIA (transient ischemic attack)    Type 2 diabetes mellitus (HCC) 10/08/2019   Past Surgical History:  Procedure Laterality Date   BUBBLE STUDY  10/10/2019   Procedure: BUBBLE STUDY;  Surgeon: Sande Rives, MD;  Location: [facility] ENDOSCOPY;  Service: Cardiology;;   TEE WITHOUT CARDIOVERSION N/A 10/10/2019   Procedure: TRANSESOPHAGEAL ECHOCARDIOGRAM (TEE);  Surgeon: Sande Rives, MD;  Location: [facility] ENDOSCOPY;  Service: Cardiology;  Laterality: N/A;   Patient Active Problem List   Diagnosis Date Noted   Dementia without behavioral disturbance, psychotic disturbance, mood disturbance, or anxiety (HCC) 11/30/2021   Urinary incontinence 11/30/2021   Gait abnormality 11/30/2021   Dementia without behavioral disturbance (HCC) 02/23/2021   Cerebrovascular accident (CVA) (HCC) 02/23/2021   Hyperglycemia due to diabetes mellitus (HCC) 09/24/2020   Acute CVA (cerebrovascular accident) (HCC) 10/08/2019   Type 2 diabetes mellitus (HCC) 10/08/2019   Hyperlipidemia  10/08/2019   BPH (benign prostatic hyperplasia) 10/08/2019   Hypertension 10/08/2019   ARF (acute renal failure) (HCC) 05/08/2019    ONSET DATE: Dec. 2020 for CVA:  Referral date 04-12-23  REFERRING DIAG:  Diagnosis  R26.81 (ICD-10-CM) - Unsteadiness on feet    THERAPY DIAG:  Unsteadiness on feet  Other abnormalities of gait and mobility  Rationale for Evaluation and Treatment: Rehabilitation  SUBJECTIVE:                                                                                                                                                                                             SUBJECTIVE STATEMENT:  Pt reports he thought he was coming to an appt to address pain in his Lt big toe - did not know he was coming to PT - pt states he is doing well - exercises daily for about an hour at his Day program and doesn't really feel he needs PT; Pt reports he just started using rollator few months ago; started going to [facility] about a year ago - went [facility]; [place] [facility] [place] in 2020 to live with daughter and then had 2 strokes - one in Dec. 2020 - received OP PT at this facility for approx. 7 visits in 2021  Pt accompanied by:  daughter [name] - waited in lobby  PERTINENT HISTORY: CVA in Dec. 2020; dementia: HTN,   PAIN:  Are you having pain? Yes: NPRS scale: 8-9/10 Pain location: Lt great toe Pain description: intermittent; soreness;  Aggravating factors: none specific Relieving factors: none - "just deal with it" Has not been that bad until this week - pt states he did inform his doctor about the pain but he didn't do anything for it  PRECAUTIONS: Fall  WEIGHT BEARING RESTRICTIONS: No  FALLS: Has patient fallen in last 6 months? Yes. Number of falls 1- pt states he slipped and fell getting on the bus at [facility] (11-08-22)  LIVING ENVIRONMENT: Lives with: lives with their daughter Lives in: House/apartment Stairs:  Yes: Internal: 12 steps; can reach both and External: 1 steps; none Has following equipment at home: Walker - 4 wheeled  PLOF: Independent with basic ADLs, Independent with household mobility without device, and goes to [employer] 5 days/week and does chair exercise for approx. 1 hour exercise and dancing  PATIENT GOALS: "I want to do everything I can"; improve with walking and balance   OBJECTIVE:   DIAGNOSTIC FINDINGS: none related to this POC  COGNITION: Overall cognitive status: Impaired and decreased safety awareness   SENSATION: WFL  COORDINATION: WFL's bil. LE's  POSTURE: rounded shoulders, increased thoracic kyphosis, flexed trunk , and use of rollator contributes to posture deviations  LOWER EXTREMITY ROM:   WFL's bil. LE's   LOWER EXTREMITY MMT:  WFL's bil. LE's   BED MOBILITY:  Independent  TRANSFERS: Assistive device utilized: Environmental consultant - 4 wheeled  Sit to stand: SBA Stand to sit: SBA  GAIT: Gait pattern: step through pattern Distance walked: 50' Assistive device utilized: Environmental consultant - 4 wheeled Level of assistance: SBA Comments: needs cues to stay close to RW  FUNCTIONAL TESTS:  5 times sit to stand: 23.75 secs without UE support Timed up and go (TUG): 18.31 secs without rollator (pt had LOB with turn, requiring min assist for balance recovery);  23.38 secs with rollator 10 meter walk test: 14.44 secs with rollator = 2.27 ft/sec  PATIENT SURVEYS:  N/A as referring diagnosis is imbalance  TODAY'S TREATMENT:  DATE: eval only    PATIENT EDUCATION: Education details: eval results discussed with pt and then with daughter after eval as she did not stay for evaluation Person educated: Patient and Child(ren) Education method: Explanation Education comprehension: verbalized understanding  HOME EXERCISE PROGRAM: To be  issued  GOALS: Goals reviewed with patient? Yes  SHORT TERM GOALS: Target date: 06-02-23 - same as LTG's   LONG TERM GOALS: Target date: 06-02-23  Pt will amb. 250' with rollator with SBA with min. Cues to stay close to RW to facilitate upright posture. Baseline:  Goal status: INITIAL  2.  Improve TUG score to </= 18 secs with use of rollator to reduce fall risk. Baseline: 23.38 secs  Goal status: INITIAL  3.  Improve 5x sit to stand score to </= 19 secs without UE support to demo improved LE strength and balance. Baseline: 23.75 secs Goal status: INITIAL  4.  Caregiver will verbalize understanding of HEP for balance exercises in order to assist pt with performing HEP.  Baseline:  Goal status: INITIAL  5.  Pt will negotiate 4 steps with use of 1 handrail with SBA using step by step sequence. Baseline:  Goal status: INITIAL  ASSESSMENT:  CLINICAL IMPRESSION: Patient is a 89 y.o. gentleman who was seen today for physical therapy evaluation and treatment for imbalance.  PMH includes CVA sustained in Dec. 2020, dementia, h/o TIAs in April and  in May 2022.  Pt is currently using rollator for assistance with community ambulation (states the Day Program employees recommended he start using this device to increase his safety with walking); pt reports he walks in his home without device.  Pt demonstrates some decreased safety awareness as evidenced by reporting he could perform TUG test without rollator, but had LOB during the turn in this test.  Pt able to stand from chair without UE support with mild unsteadiness upon initial standing.  Pt has postural deviations which are partially due to use of rollator as it produces a forward flexed posture when device is not kept close to the user.  Pt also demonstrates age related balance deficits.  Pt will benefit from PT to address gait and balance deficits and postural deviations.    OBJECTIVE IMPAIRMENTS: Abnormal gait, decreased balance, decreased  knowledge of use of DME, and postural dysfunction.   ACTIVITY LIMITATIONS: carrying, lifting, squatting, stairs, and locomotion level  PARTICIPATION LIMITATIONS: meal prep, cleaning, laundry, shopping, and community activity  PERSONAL FACTORS: Age, Behavior pattern, Time since onset of injury/illness/exacerbation, and h/o dementia  are also affecting patient's functional outcome.   REHAB POTENTIAL: Fair due to h/o dementia and age related balance deficits   CLINICAL DECISION MAKING: Stable/uncomplicated  EVALUATION COMPLEXITY: Low  PLAN:  PT FREQUENCY: 1x/week  PT DURATION: 4 weeks  PLANNED INTERVENTIONS: Therapeutic exercises, Therapeutic activity, Neuromuscular re-education, Balance training, Gait training, Patient/Family education, Self Care, Stair training, and DME instructions  PLAN FOR NEXT SESSION: issue balance HEP   Jeanee Fabre, Donavan Burnet, PT 04/26/2023, 2:18 PM

## 2023-05-09 ENCOUNTER — Ambulatory Visit: Payer: Medicare HMO | Admitting: Physical Therapy

## 2023-05-09 DIAGNOSIS — R2681 Unsteadiness on feet: Secondary | ICD-10-CM

## 2023-05-09 DIAGNOSIS — R2689 Other abnormalities of gait and mobility: Secondary | ICD-10-CM

## 2023-05-09 NOTE — Therapy (Unsigned)
OUTPATIENT PHYSICAL THERAPY NEURO TREATMENT NOTE   Patient Name: Erik Moss MRN: [id number] DOB:13-Nov-1932, 89 y.o., male Today's Date: 05/10/2023   PCP: Harvest Forest, MD REFERRING PROVIDER: Harvest Forest, MD  END OF SESSION:  PT End of Session - 05/10/23 2132     Visit Number 2    Number of Visits 5    Date for PT Re-Evaluation 06/02/23   pushed out due to schedule availability   Authorization Type [employer] Medicare    PT Start Time 1450    PT Stop Time 1535    PT Time Calculation (min) 45 min    Equipment Utilized During Treatment Gait belt    Activity Tolerance Patient tolerated treatment well    Behavior During Therapy [facility] for tasks assessed/performed              Past Medical History:  Diagnosis Date   BPH (benign prostatic hyperplasia) 10/08/2019   Diabetes mellitus without complication (HCC)    Hyperlipidemia 10/08/2019   Hypertension 10/08/2019   Renal disease    Stroke [facility])    TIA (transient ischemic attack)    Type 2 diabetes mellitus (HCC) 10/08/2019   Past Surgical History:  Procedure Laterality Date   BUBBLE STUDY  10/10/2019   Procedure: BUBBLE STUDY;  Surgeon: Sande Rives, MD;  Location: [facility] ENDOSCOPY;  Service: Cardiology;;   TEE WITHOUT CARDIOVERSION N/A 10/10/2019   Procedure: TRANSESOPHAGEAL ECHOCARDIOGRAM (TEE);  Surgeon: Sande Rives, MD;  Location: [facility] ENDOSCOPY;  Service: Cardiology;  Laterality: N/A;   Patient Active Problem List   Diagnosis Date Noted   Dementia without behavioral disturbance, psychotic disturbance, mood disturbance, or anxiety (HCC) 11/30/2021   Urinary incontinence 11/30/2021   Gait abnormality 11/30/2021   Dementia without behavioral disturbance (HCC) 02/23/2021   Cerebrovascular accident (CVA) (HCC) 02/23/2021   Hyperglycemia due to diabetes mellitus (HCC) 09/24/2020   Acute CVA (cerebrovascular accident) (HCC) 10/08/2019   Type 2 diabetes mellitus (HCC) 10/08/2019    Hyperlipidemia 10/08/2019   BPH (benign prostatic hyperplasia) 10/08/2019   Hypertension 10/08/2019   ARF (acute renal failure) (HCC) 05/08/2019    ONSET DATE: Dec. 2020 for CVA:  Referral date 04-12-23  REFERRING DIAG:  Diagnosis  R26.81 (ICD-10-CM) - Unsteadiness on feet    THERAPY DIAG:  Unsteadiness on feet  Other abnormalities of gait and mobility  Rationale for Evaluation and Treatment: Rehabilitation  SUBJECTIVE:  SUBJECTIVE STATEMENT: Pt reports no problems or changes since initial eval 2 weeks ago - states he is doing fine   Pt accompanied by:  daughter [name] - waited in lobby  PERTINENT HISTORY: CVA in Dec. 2020; dementia: HTN,   PAIN:  Are you having pain? Yes: NPRS scale: 8-9/10 Pain location: Lt great toe Pain description: intermittent; soreness;  Aggravating factors: none specific Relieving factors: none - "just deal with it" Has not been that bad until this week - pt states he did inform his doctor about the pain but he didn't do anything for it  PRECAUTIONS: Fall  WEIGHT BEARING RESTRICTIONS: No  FALLS: Has patient fallen in last 6 months? Yes. Number of falls 1- pt states he slipped and fell getting on the bus at [facility] (11-08-22)  LIVING ENVIRONMENT: Lives with: lives with their daughter Lives in: House/apartment Stairs: Yes: Internal: 12 steps; can reach both and External: 1 steps; none Has following equipment at home: Walker - 4 wheeled  PLOF: Independent with basic ADLs, Independent with household mobility without device, and goes to [employer] 5 days/week and does chair exercise for approx. 1 hour exercise and dancing  PATIENT GOALS: "I want to do everything I can"; improve with walking and balance   OBJECTIVE:  Today's Treatment:   05-09-23   GAIT: Gait pattern: step through pattern Distance walked: 350' (3 laps) Assistive device utilized: Environmental consultant - 4 wheeled Level of assistance: SBA Comments: needs cues to stay close to RW and to stand erect   Sit to stand transfers from mat - 5 reps without UE support    Pt performed standing balance exercises and postural retraining exercises for HEP - see below for details; pt performed exercises 10 reps each   HEP - Medbridge  Access Code: [email or web address] URL: [email or web address] Date: 05/10/2023 Prepared by: Maebelle Munroe  Exercises - Standing March with Counter Support  - 1 x daily - 7 x weekly - 1 sets - 10 reps - Standing Hip Flexion with Counter Support  - 1 x daily - 7 x weekly - 1 sets - 10 reps - Standing Hip Abduction with Counter Support  - 1 x daily - 7 x weekly - 1 sets - 10 reps - Standing Hip Extension with Counter Support  - 1 x daily - 7 x weekly - 1 sets - 10 reps - Seated Scapular Retraction with External Rotation  - 1 x daily - 7 x weekly - 3 sets - 10 reps - Seated Scapular Retraction  - 1 x daily - 7 x weekly - 1 sets - 1 reps - 3 sec hold  PATIENT EDUCATION: Education details: Medbridge  Person educated: Patient and Child(ren) Education method: Explanation Education comprehension: verbalized understanding  HOME EXERCISE PROGRAM: To be issued  GOALS: Goals reviewed with patient? Yes  SHORT TERM GOALS: Target date: 06-02-23 - same as LTG's   LONG TERM GOALS: Target date: 06-02-23  Pt will amb. 250' with rollator with SBA with min. Cues to stay close to RW to facilitate upright posture. Baseline:  Goal status: INITIAL  2.  Improve TUG score to </= 18 secs with use of rollator to reduce fall risk. Baseline: 23.38 secs  Goal status: INITIAL  3.  Improve 5x sit to stand score to </= 19 secs without UE support to demo improved LE strength and balance. Baseline: 23.75 secs Goal status: INITIAL  4.  Caregiver will verbalize  understanding of HEP for balance exercises in order to assist  pt with performing HEP.  Baseline:  Goal status: INITIAL  5.  Pt will negotiate 4 steps with use of 1 handrail with SBA using step by step sequence. Baseline:  Goal status: INITIAL  ASSESSMENT:  CLINICAL IMPRESSION: PT session focused on establishing HEP for balance and postural retraining exercises.  Pt needs cues to stay close to rollator to facilitate upright/erect  posture.  Pt had one occurrence of Rt foot scuffing floor but corrected with verbal cues.  Cont with POC.    OBJECTIVE IMPAIRMENTS: Abnormal gait, decreased balance, decreased knowledge of use of DME, and postural dysfunction.   ACTIVITY LIMITATIONS: carrying, lifting, squatting, stairs, and locomotion level  PARTICIPATION LIMITATIONS: meal prep, cleaning, laundry, shopping, and community activity  PERSONAL FACTORS: Age, Behavior pattern, Time since onset of injury/illness/exacerbation, and h/o dementia  are also affecting patient's functional outcome.   REHAB POTENTIAL: Fair due to h/o dementia and age related balance deficits   CLINICAL DECISION MAKING: Stable/uncomplicated  EVALUATION COMPLEXITY: Low  PLAN:  PT FREQUENCY: 1x/week  PT DURATION: 4 weeks  PLANNED INTERVENTIONS: Therapeutic exercises, Therapeutic activity, Neuromuscular re-education, Balance training, Gait training, Patient/Family education, Self Care, Stair training, and DME instructions  PLAN FOR NEXT SESSION: check balance HEP; cont strengthening and posture exs .   Kary Kos, PT 05/10/2023, 9:34 PM

## 2023-05-10 ENCOUNTER — Encounter: Payer: Self-pay | Admitting: Physical Therapy

## 2023-05-10 ENCOUNTER — Encounter: Payer: Self-pay | Admitting: Podiatry

## 2023-05-10 ENCOUNTER — Ambulatory Visit: Payer: Medicare HMO | Admitting: Podiatry

## 2023-05-10 DIAGNOSIS — L600 Ingrowing nail: Secondary | ICD-10-CM

## 2023-05-10 DIAGNOSIS — B351 Tinea unguium: Secondary | ICD-10-CM | POA: Diagnosis not present

## 2023-05-10 NOTE — Patient Instructions (Signed)

## 2023-05-10 NOTE — Progress Notes (Signed)
Subjective:   Patient ID: Erik Moss, male   DOB: 89 y.o.   MRN: [id number]   HPI Patient presents with caregiver with ingrown toenail deformity of the left big toenail very sore when pressed they tried to trim it and soak it without relief the symptoms with patient's daughter with him today.  Patient never has smoked is not active   Review of Systems  All other systems reviewed and are negative.       Objective:  Physical Exam Vitals and nursing note reviewed.  Constitutional:      Appearance: He is well-developed.  Pulmonary:     Effort: Pulmonary effort is normal.  Musculoskeletal:        General: Normal range of motion.  Skin:    General: Skin is warm.  Neurological:     Mental Status: He is alert.     Neurovascular status found to be intact muscle strength was found to be adequate range of motion adequate with incurvated medial border left hallux with thickness of the entire nailbed.  Patient is having discomfort when I palpated to the area no erythema edema drainage noted     Assessment:  Significant ingrown toenail deformity left hallux medial border painful     Plan:  H&P reviewed condition diabetes under good control explained different options patient opted for surgical correction of deformity explained procedure risk and patient signed consent form.  Today I infiltrated the left hallux 60 mg like Marcaine mixture sterile prep done using sterile instrumentation remove the medial border exposed matrix applied phenol 3 applications 30 seconds followed by alcohol lavage sterile dressing gave instructions on soaks leave dressing on 24 hours take it off earlier if throbbing were to occur and encouraged to call with questions concerns

## 2023-05-22 ENCOUNTER — Ambulatory Visit: Payer: Medicare HMO | Admitting: Physical Therapy

## 2023-05-22 ENCOUNTER — Encounter: Payer: Self-pay | Admitting: Physical Therapy

## 2023-05-22 DIAGNOSIS — R2681 Unsteadiness on feet: Secondary | ICD-10-CM

## 2023-05-22 DIAGNOSIS — R2689 Other abnormalities of gait and mobility: Secondary | ICD-10-CM

## 2023-05-22 NOTE — Therapy (Signed)
OUTPATIENT PHYSICAL THERAPY NEURO TREATMENT NOTE   Patient Name: Erik Moss MRN: [id number] DOB:December 19, 1932, 89 y.o., male Today's Date: 05/22/2023   PCP: Harvest Forest, MD REFERRING PROVIDER: Harvest Forest, MD  END OF SESSION:  PT End of Session - 05/22/23 2018     Visit Number 3    Number of Visits 5    Date for PT Re-Evaluation 06/02/23   pushed out due to schedule availability   Authorization Type [employer] Medicare    PT Start Time 0802    PT Stop Time 0844    PT Time Calculation (min) 42 min    Equipment Utilized During Treatment Gait belt    Activity Tolerance Patient tolerated treatment well    Behavior During Therapy [facility] for tasks assessed/performed               Past Medical History:  Diagnosis Date   BPH (benign prostatic hyperplasia) 10/08/2019   Diabetes mellitus without complication (HCC)    Hyperlipidemia 10/08/2019   Hypertension 10/08/2019   Renal disease    Stroke [facility])    TIA (transient ischemic attack)    Type 2 diabetes mellitus (HCC) 10/08/2019   Past Surgical History:  Procedure Laterality Date   BUBBLE STUDY  10/10/2019   Procedure: BUBBLE STUDY;  Surgeon: Sande Rives, MD;  Location: [facility] ENDOSCOPY;  Service: Cardiology;;   TEE WITHOUT CARDIOVERSION N/A 10/10/2019   Procedure: TRANSESOPHAGEAL ECHOCARDIOGRAM (TEE);  Surgeon: Sande Rives, MD;  Location: [facility] ENDOSCOPY;  Service: Cardiology;  Laterality: N/A;   Patient Active Problem List   Diagnosis Date Noted   Dementia without behavioral disturbance, psychotic disturbance, mood disturbance, or anxiety (HCC) 11/30/2021   Urinary incontinence 11/30/2021   Gait abnormality 11/30/2021   Dementia without behavioral disturbance (HCC) 02/23/2021   Cerebrovascular accident (CVA) (HCC) 02/23/2021   Hyperglycemia due to diabetes mellitus (HCC) 09/24/2020   Acute CVA (cerebrovascular accident) (HCC) 10/08/2019   Type 2 diabetes mellitus (HCC) 10/08/2019    Hyperlipidemia 10/08/2019   BPH (benign prostatic hyperplasia) 10/08/2019   Hypertension 10/08/2019   ARF (acute renal failure) (HCC) 05/08/2019    ONSET DATE: Dec. 2020 for CVA:  Referral date 04-12-23  REFERRING DIAG:  Diagnosis  R26.81 (ICD-10-CM) - Unsteadiness on feet    THERAPY DIAG:  Unsteadiness on feet  Other abnormalities of gait and mobility  Rationale for Evaluation and Treatment: Rehabilitation  SUBJECTIVE:  SUBJECTIVE STATEMENT: Pt reports his ingrown toenail is healing well - no longer having any pain; reports he did some of the exercises at home but does a lot at his day program.  Pt reports he does fine-    Pt accompanied by:  daughter [name] - waited in lobby  PERTINENT HISTORY: CVA in Dec. 2020; dementia: HTN,   PAIN:  Are you having pain? No   PRECAUTIONS: Fall  WEIGHT BEARING RESTRICTIONS: No  FALLS: Has patient fallen in last 6 months? Yes. Number of falls 1- pt states he slipped and fell getting on the bus at [facility] (11-08-22)  LIVING ENVIRONMENT: Lives with: lives with their daughter Lives in: House/apartment Stairs: Yes: Internal: 12 steps; can reach both and External: 1 steps; none Has following equipment at home: Walker - 4 wheeled  PLOF: Independent with basic ADLs, Independent with household mobility without device, and goes to [employer] 5 days/week and does chair exercise for approx. 1 hour exercise and dancing  PATIENT GOALS: "I want to do everything I can"; improve with walking and balance   OBJECTIVE:  Today's Treatment:  05-22-23   GAIT: Gait pattern: step through pattern Distance walked: 350' (3 laps) Assistive device utilized: No device Level of assistance: SBA Comments: cues for increased step length and to stand erect;  no scuffing of Rt foot in today's session  NeuroRe-ed: Rockerboard inside // bars with tactile cues for correct posture and verbal cues to stand erect  - pt performed 10 reps with bil. UE support; 10 reps with RUE support:  pt stood on board without UE support - performed horizontal head turns 5 reps with CGA for balance recovery  Marching 10 reps each leg without UE support on // bars  Pt performed SLS activity - touching 3 balance bubbles with UE support prn; performed stepping over and back of black balance beam 5 reps each leg with UE support prn with CGA  Pt performed fig. 8's around 5 cones (no device used) to improve balance with turning with CGA; performed cone taps to each cone 1 rep each foot with min assist for balance recovery  Pt performed SLS activity of kicking bean bags (initially 3 used, then only 2)  - min assist needed for balance recovery  TherEx: SciFit level 2.8 x 6" with UE's and LE's  Standing hip flexion, extension and abduction with 2# weight on each leg with UE support on bar at mirror - verbal & demonstrational cues needed for correct positioning  Sit to stand transfers from mat with feet on Airex - 5 reps without UE support with SBA to CGA   HEP - Medbridge  Access Code: [email or web address] URL: [email or web address] Date: 05/10/2023 Prepared by: Maebelle Munroe  Exercises - Standing March with Counter Support  - 1 x daily - 7 x weekly - 1 sets - 10 reps - Standing Hip Flexion with Counter Support  - 1 x daily - 7 x weekly - 1 sets - 10 reps - Standing Hip Abduction with Counter Support  - 1 x daily - 7 x weekly - 1 sets - 10 reps - Standing Hip Extension with Counter Support  - 1 x daily - 7 x weekly - 1 sets - 10 reps - Seated Scapular Retraction with External Rotation  - 1 x daily - 7 x weekly - 3 sets - 10 reps - Seated Scapular Retraction  - 1 x daily - 7 x weekly - 1 sets - 1 reps - 3  sec hold  PATIENT EDUCATION: Education details: Medbridge   Person educated: Patient and Child(ren) Education method: Explanation Education comprehension: verbalized understanding  HOME EXERCISE PROGRAM: To be issued  GOALS: Goals reviewed with patient? Yes  SHORT TERM GOALS: Target date: 06-02-23 - same as LTG's   LONG TERM GOALS: Target date: 06-02-23  Pt will amb. 250' with rollator with SBA with min. Cues to stay close to RW to facilitate upright posture. Baseline:  Goal status: INITIAL  2.  Improve TUG score to </= 18 secs with use of rollator to reduce fall risk. Baseline: 23.38 secs  Goal status: INITIAL  3.  Improve 5x sit to stand score to </= 19 secs without UE support to demo improved LE strength and balance. Baseline: 23.75 secs Goal status: INITIAL  4.  Caregiver will verbalize understanding of HEP for balance exercises in order to assist pt with performing HEP.  Baseline:  Goal status: INITIAL  5.  Pt will negotiate 4 steps with use of 1 handrail with SBA using step by step sequence. Baseline:  Goal status: INITIAL  ASSESSMENT:  CLINICAL IMPRESSION: PT session focused on dynamic balance training and gait training without use of rollator.  Pt needs cues to stand erect to decrease forward trunk lean but able to correct somewhat with cues.  No occurrence of Rt foot scuffing in today's session.  Pt is progressing well towards goals.  Cont with POC.    OBJECTIVE IMPAIRMENTS: Abnormal gait, decreased balance, decreased knowledge of use of DME, and postural dysfunction.   ACTIVITY LIMITATIONS: carrying, lifting, squatting, stairs, and locomotion level  PARTICIPATION LIMITATIONS: meal prep, cleaning, laundry, shopping, and community activity  PERSONAL FACTORS: Age, Behavior pattern, Time since onset of injury/illness/exacerbation, and h/o dementia  are also affecting patient's functional outcome.   REHAB POTENTIAL: Fair due to h/o dementia and age related balance deficits   CLINICAL DECISION MAKING:  Stable/uncomplicated  EVALUATION COMPLEXITY: Low  PLAN:  PT FREQUENCY: 1x/week  PT DURATION: 4 weeks  PLANNED INTERVENTIONS: Therapeutic exercises, Therapeutic activity, Neuromuscular re-education, Balance training, Gait training, Patient/Family education, Self Care, Stair training, and DME instructions  PLAN FOR NEXT SESSION: cont balance & strengthening exercises   Solace Wendorff, Donavan Burnet, PT 05/22/2023, 8:23 PM

## 2023-05-25 DIAGNOSIS — N400 Enlarged prostate without lower urinary tract symptoms: Secondary | ICD-10-CM | POA: Diagnosis not present

## 2023-05-25 DIAGNOSIS — N281 Cyst of kidney, acquired: Secondary | ICD-10-CM | POA: Diagnosis not present

## 2023-05-25 DIAGNOSIS — N184 Chronic kidney disease, stage 4 (severe): Secondary | ICD-10-CM | POA: Diagnosis not present

## 2023-05-25 DIAGNOSIS — E875 Hyperkalemia: Secondary | ICD-10-CM | POA: Diagnosis not present

## 2023-05-25 DIAGNOSIS — E872 Acidosis, unspecified: Secondary | ICD-10-CM | POA: Diagnosis not present

## 2023-05-25 DIAGNOSIS — I129 Hypertensive chronic kidney disease with stage 1 through stage 4 chronic kidney disease, or unspecified chronic kidney disease: Secondary | ICD-10-CM | POA: Diagnosis not present

## 2023-05-25 DIAGNOSIS — D631 Anemia in chronic kidney disease: Secondary | ICD-10-CM | POA: Diagnosis not present

## 2023-05-25 DIAGNOSIS — F0390 Unspecified dementia without behavioral disturbance: Secondary | ICD-10-CM | POA: Diagnosis not present

## 2023-05-25 DIAGNOSIS — E1122 Type 2 diabetes mellitus with diabetic chronic kidney disease: Secondary | ICD-10-CM | POA: Diagnosis not present

## 2023-05-29 ENCOUNTER — Ambulatory Visit: Payer: Medicare HMO | Attending: Internal Medicine | Admitting: Physical Therapy

## 2023-05-29 ENCOUNTER — Encounter: Payer: Self-pay | Admitting: Physical Therapy

## 2023-05-29 DIAGNOSIS — R2689 Other abnormalities of gait and mobility: Secondary | ICD-10-CM | POA: Insufficient documentation

## 2023-05-29 DIAGNOSIS — R2681 Unsteadiness on feet: Secondary | ICD-10-CM | POA: Insufficient documentation

## 2023-05-29 NOTE — Therapy (Signed)
OUTPATIENT PHYSICAL THERAPY NEURO TREATMENT NOTE   Patient Name: Erik Moss MRN: [id number] DOB:26-Jun-1933, 89 y.o., male Today's Date: 05/29/2023   PCP: Harvest Forest, MD REFERRING PROVIDER: Harvest Forest, MD  END OF SESSION:  PT End of Session - 05/29/23 1021     Visit Number 4    Number of Visits 5    Date for PT Re-Evaluation 06/02/23   pushed out due to schedule availability   Authorization Type [employer]    PT Start Time 8413   pt arrived late for appt.   PT Stop Time 0845    PT Time Calculation (min) 32 min    Equipment Utilized During Treatment Gait belt    Activity Tolerance Patient tolerated treatment well    Behavior During Therapy WFL for tasks assessed/performed                Past Medical History:  Diagnosis Date   BPH (benign prostatic hyperplasia) 10/08/2019   Diabetes mellitus without complication (HCC)    Hyperlipidemia 10/08/2019   Hypertension 10/08/2019   Renal disease    Stroke [employer])    TIA (transient ischemic attack)    Type 2 diabetes mellitus (HCC) 10/08/2019   Past Surgical History:  Procedure Laterality Date   BUBBLE STUDY  10/10/2019   Procedure: BUBBLE STUDY;  Surgeon: Sande Rives, MD;  Location: [facility] ENDOSCOPY;  Service: Cardiology;;   TEE WITHOUT CARDIOVERSION N/A 10/10/2019   Procedure: TRANSESOPHAGEAL ECHOCARDIOGRAM (TEE);  Surgeon: Sande Rives, MD;  Location: [facility] ENDOSCOPY;  Service: Cardiology;  Laterality: N/A;   Patient Active Problem List   Diagnosis Date Noted   Dementia without behavioral disturbance, psychotic disturbance, mood disturbance, or anxiety (HCC) 11/30/2021   Urinary incontinence 11/30/2021   Gait abnormality 11/30/2021   Dementia without behavioral disturbance (HCC) 02/23/2021   Cerebrovascular accident (CVA) (HCC) 02/23/2021   Hyperglycemia due to diabetes mellitus (HCC) 09/24/2020   Acute CVA (cerebrovascular accident) (HCC) 10/08/2019   Type 2 diabetes mellitus  (HCC) 10/08/2019   Hyperlipidemia 10/08/2019   BPH (benign prostatic hyperplasia) 10/08/2019   Hypertension 10/08/2019   ARF (acute renal failure) (HCC) 05/08/2019    ONSET DATE: Dec. 2020 for CVA:  Referral date 04-12-23  REFERRING DIAG:  Diagnosis  R26.81 (ICD-10-CM) - Unsteadiness on feet    THERAPY DIAG:  Unsteadiness on feet  Other abnormalities of gait and mobility  Rationale for Evaluation and Treatment: Rehabilitation  SUBJECTIVE:  SUBJECTIVE STATEMENT: Pt reports he is doing well; states he takes his rollator to the day program but does not use it a lot - says he is able to walk without it   Pt accompanied by:  daughter [name] - waited in lobby  PERTINENT HISTORY: CVA in Dec. 2020; dementia: HTN,   PAIN:  Are you having pain? No   PRECAUTIONS: Fall  WEIGHT BEARING RESTRICTIONS: No  FALLS: Has patient fallen in last 6 months? Yes. Number of falls 1- pt states he slipped and fell getting on the bus at [facility] (11-08-22)  LIVING ENVIRONMENT: Lives with: lives with their daughter Lives in: House/apartment Stairs: Yes: Internal: 12 steps; can reach both and External: 1 steps; none Has following equipment at home: Walker - 4 wheeled  PLOF: Independent with basic ADLs, Independent with household mobility without device, and goes to [employer] 5 days/week and does chair exercise for approx. 1 hour exercise and dancing  PATIENT GOALS: "I want to do everything I can"; improve with walking and balance   OBJECTIVE:  Today's Treatment:  05-29-23   GAIT: Gait pattern: step through pattern Distance walked: 230' (2 laps) Assistive device utilized: No device Level of assistance: SBA Comments: cues for increased step length and to stand erect; 1 occurrence of Rt foot  scuffing floor  Step training: pt negotiated steps with use of hand rail intermittently with ascension and with constant use of hand rail with descension with SBA; pt used step over step sequence with ascension and step by step sequence with descension  Ramp; pt amb. Up/down ramp with CGA without use of assistive device Curb:  pt negotiated curb without device with CGA with descension, SBA with ascension    NeuroRe-ed:  Rockerboard inside // bars with tactile cues for correct posture and verbal cues to stand erect  - pt performed 10 reps with bil. UE support; 10 reps with RUE support:  pt stood on board without UE support - performed horizontal head turns 5 reps with CGA for balance recovery  Marching 10 reps each leg standing on Airex with min. UE support on // bars  Pt performed SLS activity - standing on floor - touching 3 cones with UE support on // bars:  performed stepping over and back of black balance beam 5 reps each leg with UE support prn with CGA  TherEx:  Sit to stand transfers from mat without UE support 5 reps feet on floor  Pt held 5# kettlebell - performed sit to stand 1 rep holding kettlebell; then performed elbow flexion/extension in standing 10 reps RUE & RLE  SciFit level 2.5 x 6" with UE's and LE's   HEP - Medbridge  Access Code: [email or web address] URL: [email or web address] Date: 05/10/2023 Prepared by: Maebelle Munroe  Exercises - Standing March with Counter Support  - 1 x daily - 7 x weekly - 1 sets - 10 reps - Standing Hip Flexion with Counter Support  - 1 x daily - 7 x weekly - 1 sets - 10 reps - Standing Hip Abduction with Counter Support  - 1 x daily - 7 x weekly - 1 sets - 10 reps - Standing Hip Extension with Counter Support  - 1 x daily - 7 x weekly - 1 sets - 10 reps - Seated Scapular Retraction with External Rotation  - 1 x daily - 7 x weekly - 3 sets - 10 reps - Seated Scapular Retraction  - 1 x daily - 7 x weekly - 1  sets - 1 reps - 3 sec  hold  PATIENT EDUCATION: Education details: Medbridge  Person educated: Patient and Child(ren) Education method: Explanation Education comprehension: verbalized understanding  HOME EXERCISE PROGRAM: To be issued  GOALS: Goals reviewed with patient? Yes  SHORT TERM GOALS: Target date: 06-02-23 - same as LTG's   LONG TERM GOALS: Target date: 06-02-23  Pt will amb. 250' with rollator with SBA with min. Cues to stay close to RW to facilitate upright posture. Baseline:  Goal status: INITIAL  2.  Improve TUG score to </= 18 secs with use of rollator to reduce fall risk. Baseline: 23.38 secs  Goal status: INITIAL  3.  Improve 5x sit to stand score to </= 19 secs without UE support to demo improved LE strength and balance. Baseline: 23.75 secs Goal status: INITIAL  4.  Caregiver will verbalize understanding of HEP for balance exercises in order to assist pt with performing HEP.  Baseline:  Goal status: INITIAL  5.  Pt will negotiate 4 steps with use of 1 handrail with SBA using step by step sequence. Baseline:  Goal status: INITIAL  ASSESSMENT:  CLINICAL IMPRESSION: PT session focused on functional strengthening, gait training without device including step, curb and ramp training with pt able to negotiate these with only CGA to SBA and did not have LOB.  Pt demonstrates good balance skills with normal age related deficits.  Rollator was not used with gait training during session. Pt is progressing well towards goals.  Plan D/C next session.  Cont with POC.    OBJECTIVE IMPAIRMENTS: Abnormal gait, decreased balance, decreased knowledge of use of DME, and postural dysfunction.   ACTIVITY LIMITATIONS: carrying, lifting, squatting, stairs, and locomotion level  PARTICIPATION LIMITATIONS: meal prep, cleaning, laundry, shopping, and community activity  PERSONAL FACTORS: Age, Behavior pattern, Time since onset of injury/illness/exacerbation, and h/o dementia  are also affecting  patient's functional outcome.   REHAB POTENTIAL: Fair due to h/o dementia and age related balance deficits   CLINICAL DECISION MAKING: Stable/uncomplicated  EVALUATION COMPLEXITY: Low  PLAN:  PT FREQUENCY: 1x/week  PT DURATION: 4 weeks  PLANNED INTERVENTIONS: Therapeutic exercises, Therapeutic activity, Neuromuscular re-education, Balance training, Gait training, Patient/Family education, Self Care, Stair training, and DME instructions  PLAN FOR NEXT SESSION: check LTG's - D/C - cont balance & strengthening exercises   , Donavan Burnet, PT 05/29/2023, 10:23 AM

## 2023-06-05 ENCOUNTER — Ambulatory Visit: Payer: Medicare HMO | Admitting: Physical Therapy

## 2023-07-05 DIAGNOSIS — Z8673 Personal history of transient ischemic attack (TIA), and cerebral infarction without residual deficits: Secondary | ICD-10-CM | POA: Diagnosis not present

## 2023-07-05 DIAGNOSIS — N400 Enlarged prostate without lower urinary tract symptoms: Secondary | ICD-10-CM | POA: Diagnosis not present

## 2023-07-05 DIAGNOSIS — N184 Chronic kidney disease, stage 4 (severe): Secondary | ICD-10-CM | POA: Diagnosis not present

## 2023-07-05 DIAGNOSIS — I2510 Atherosclerotic heart disease of native coronary artery without angina pectoris: Secondary | ICD-10-CM | POA: Diagnosis not present

## 2023-07-05 DIAGNOSIS — E1122 Type 2 diabetes mellitus with diabetic chronic kidney disease: Secondary | ICD-10-CM | POA: Diagnosis not present

## 2023-07-05 DIAGNOSIS — E785 Hyperlipidemia, unspecified: Secondary | ICD-10-CM | POA: Diagnosis not present

## 2023-07-05 DIAGNOSIS — E1151 Type 2 diabetes mellitus with diabetic peripheral angiopathy without gangrene: Secondary | ICD-10-CM | POA: Diagnosis not present

## 2023-07-05 DIAGNOSIS — Z7984 Long term (current) use of oral hypoglycemic drugs: Secondary | ICD-10-CM | POA: Diagnosis not present

## 2023-07-05 DIAGNOSIS — Z9181 History of falling: Secondary | ICD-10-CM | POA: Diagnosis not present

## 2023-07-05 DIAGNOSIS — I129 Hypertensive chronic kidney disease with stage 1 through stage 4 chronic kidney disease, or unspecified chronic kidney disease: Secondary | ICD-10-CM | POA: Diagnosis not present

## 2023-07-05 DIAGNOSIS — N3941 Urge incontinence: Secondary | ICD-10-CM | POA: Diagnosis not present

## 2023-07-05 DIAGNOSIS — Z7902 Long term (current) use of antithrombotics/antiplatelets: Secondary | ICD-10-CM | POA: Diagnosis not present

## 2023-07-21 ENCOUNTER — Emergency Department (HOSPITAL_COMMUNITY)
Admission: EM | Admit: 2023-07-21 | Discharge: 2023-07-21 | Disposition: A | Discharge status: Home / Self Care | Payer: Medicare HMO | Attending: Emergency Medicine | Admitting: Emergency Medicine

## 2023-07-21 ENCOUNTER — Encounter (HOSPITAL_COMMUNITY): Payer: Self-pay

## 2023-07-21 ENCOUNTER — Emergency Department (HOSPITAL_COMMUNITY): Payer: Medicare HMO

## 2023-07-21 ENCOUNTER — Other Ambulatory Visit: Payer: Self-pay

## 2023-07-21 DIAGNOSIS — Z79899 Other long term (current) drug therapy: Secondary | ICD-10-CM | POA: Diagnosis not present

## 2023-07-21 DIAGNOSIS — E119 Type 2 diabetes mellitus without complications: Secondary | ICD-10-CM | POA: Insufficient documentation

## 2023-07-21 DIAGNOSIS — F0390 Unspecified dementia without behavioral disturbance: Secondary | ICD-10-CM | POA: Diagnosis not present

## 2023-07-21 DIAGNOSIS — R519 Headache, unspecified: Secondary | ICD-10-CM | POA: Diagnosis not present

## 2023-07-21 DIAGNOSIS — I10 Essential (primary) hypertension: Secondary | ICD-10-CM | POA: Diagnosis not present

## 2023-07-21 DIAGNOSIS — Z8673 Personal history of transient ischemic attack (TIA), and cerebral infarction without residual deficits: Secondary | ICD-10-CM | POA: Insufficient documentation

## 2023-07-21 DIAGNOSIS — S0990XA Unspecified injury of head, initial encounter: Secondary | ICD-10-CM

## 2023-07-21 DIAGNOSIS — W108XXA Fall (on) (from) other stairs and steps, initial encounter: Secondary | ICD-10-CM | POA: Diagnosis not present

## 2023-07-21 DIAGNOSIS — S0083XA Contusion of other part of head, initial encounter: Secondary | ICD-10-CM | POA: Insufficient documentation

## 2023-07-21 DIAGNOSIS — S12600A Unspecified displaced fracture of seventh cervical vertebra, initial encounter for closed fracture: Secondary | ICD-10-CM | POA: Diagnosis not present

## 2023-07-21 DIAGNOSIS — S40011A Contusion of right shoulder, initial encounter: Secondary | ICD-10-CM

## 2023-07-21 DIAGNOSIS — Z7984 Long term (current) use of oral hypoglycemic drugs: Secondary | ICD-10-CM | POA: Diagnosis not present

## 2023-07-21 DIAGNOSIS — S0003XA Contusion of scalp, initial encounter: Secondary | ICD-10-CM | POA: Diagnosis not present

## 2023-07-21 DIAGNOSIS — M542 Cervicalgia: Secondary | ICD-10-CM | POA: Diagnosis not present

## 2023-07-21 DIAGNOSIS — M25511 Pain in right shoulder: Secondary | ICD-10-CM | POA: Diagnosis not present

## 2023-07-21 DIAGNOSIS — Z7902 Long term (current) use of antithrombotics/antiplatelets: Secondary | ICD-10-CM | POA: Diagnosis not present

## 2023-07-21 DIAGNOSIS — W19XXXA Unspecified fall, initial encounter: Secondary | ICD-10-CM

## 2023-07-21 DIAGNOSIS — M4802 Spinal stenosis, cervical region: Secondary | ICD-10-CM | POA: Diagnosis not present

## 2023-07-21 DIAGNOSIS — M4312 Spondylolisthesis, cervical region: Secondary | ICD-10-CM | POA: Diagnosis not present

## 2023-07-21 LAB — CBG MONITORING, ED: Glucose-Capillary: 145 mg/dL — ABNORMAL HIGH (ref 70–99)

## 2023-07-21 NOTE — ED Provider Notes (Signed)
[place] EMERGENCY DEPARTMENT AT [facility] Provider Note   CSN: [id number] Arrival date & time: 07/21/23  0813     History  Chief Complaint  Patient presents with   Fall   Head Injury    Evertt Chouinard Pickney is a 89 y.o. male.  Patient is a 89 year old male with a history of diabetes, hypertension, hyperlipidemia, prior stroke/TIA and some dementia/cognitive dysfunction on Plavix who presents with head injury after a fall.  He was at home with his daughter and got up to get ready and apparently slipped on some stairs falling down some stairs.  Unclear exactly how many stairs he fell down.  He denies any loss of consciousness.  He denies any symptoms preceding the fall.  He was noted to have a hematoma to his right forehead.  He went to his memory care day program and the advised that he needs to come here to the emergency room for evaluation.  He complains of pain to his right shoulder.  He denies any other injuries from the fall.  Per daughter, patient is baseline mental status.       Home Medications Prior to Admission medications   Medication Sig Start Date End Date Taking? Authorizing Provider  amLODipine (NORVASC) 5 MG tablet Take 5 mg by mouth daily.    [provider]  atorvastatin (LIPITOR) 40 MG tablet Take 1 tablet (40 mg total) by mouth daily. 10/10/19 09/24/20  [place], [name], DO  blood glucose meter kit and supplies Dispense based on patient and insurance preference. Use up to four times daily as directed. (FOR ICD-10 E10.9, E11.9). 09/25/20   Lanae Boast, MD  Cholecalciferol (D3 PO) Take 1 capsule by mouth daily.    [provider]  clopidogrel (PLAVIX) 75 MG tablet Take 75 mg by mouth daily. 11/05/19   [provider]  diphenhydrAMINE (BENADRYL) 12.5 MG/5ML elixir Take 6.25 mg by mouth 4 (four) times daily as needed for allergies.    [provider]  docusate sodium (COLACE) 100 MG capsule Take 100 mg by mouth daily.     [provider]  glimepiride (AMARYL) 2 MG tablet Take 2 mg by mouth in the morning and at bedtime.    [provider]  memantine (NAMENDA) 10 MG tablet Take 1 tablet (10 mg total) by mouth 2 (two) times daily. 12/05/22   Ihor Austin, NP  mirabegron ER (MYRBETRIQ) 50 MG TB24 tablet Take 50 mg by mouth daily.    [provider]  sitaGLIPtin (JANUVIA) 25 MG tablet Take 25 mg by mouth daily.    [provider]  sodium bicarbonate 650 MG tablet Take 650 mg by mouth 2 (two) times daily.    [provider]      Allergies    Patient has no known allergies.    Review of Systems   Review of Systems  Constitutional:  Negative for fatigue and fever.  Respiratory:  Negative for shortness of breath.   Cardiovascular:  Negative for chest pain and palpitations.  Gastrointestinal:  Negative for abdominal pain and vomiting.  Musculoskeletal:  Positive for arthralgias. Negative for back pain and neck pain.  Skin:  Negative for wound.  Neurological:  Negative for light-headedness and headaches.    Physical Exam Updated Vital Signs BP 128/65   Pulse 76   Temp 98.1 F (36.7 C) (Oral)   Resp 16   Ht 5\' 6"  (1.676 m)   Wt 63 kg   SpO2 97%  BMI 22.42 kg/m  Physical Exam Constitutional:      Appearance: He is well-developed.  HENT:     Head: Normocephalic.     Comments: Large hematoma into the right forehead, no overlying wounds Eyes:     Pupils: Pupils are equal, round, and reactive to light.  Neck:     Comments: No pain to the cervical, thoracic or lumbosacral spine, no step-offs or deformities Cardiovascular:     Rate and Rhythm: Normal rate and regular rhythm.     Heart sounds: Normal heart sounds.  Pulmonary:     Effort: Pulmonary effort is normal. No respiratory distress.     Breath sounds: Normal breath sounds. No wheezing or rales.  Chest:     Chest wall: No tenderness.  Abdominal:     General: Bowel sounds are normal.      Palpations: Abdomen is soft.     Tenderness: There is no abdominal tenderness. There is no guarding or rebound.  Musculoskeletal:        General: Normal range of motion.     Comments: Mild tenderness on palpation of the right shoulder, no deformity noted, no other pain on palpation or range of motion of the extremities including the hips.  Radial pulses are intact.  He has normal sensation and motor function in all extremities.  Lymphadenopathy:     Cervical: No cervical adenopathy.  Skin:    General: Skin is warm and dry.     Findings: No rash.  Neurological:     Mental Status: He is alert and oriented to person, place, and time.     ED Results / Procedures / Treatments   Labs (all labs ordered are listed, but only abnormal results are displayed) Labs Reviewed  CBG MONITORING, ED - Abnormal; Notable for the following components:      Result Value   Glucose-Capillary 145 (*)    All other components within normal limits    EKG None  Radiology CT Cervical Spine Wo Contrast  Result Date: 07/21/2023 CLINICAL DATA:  89 year old male status post fall down stairs at 0630 hours. Right head hematoma. Pain. EXAM: CT CERVICAL SPINE WITHOUT CONTRAST TECHNIQUE: Multidetector CT imaging of the cervical spine was performed without intravenous contrast. Multiplanar CT image reconstructions were also generated. RADIATION DOSE REDUCTION: This exam was performed according to the departmental dose-optimization program which includes automated exposure control, adjustment of the mA and/or kV according to patient size and/or use of iterative reconstruction technique. COMPARISON:  Head CT today.  Cervical spine CT 11/08/2022. FINDINGS: Alignment: Increased cervical lordosis since January. Chronic degenerative appearing spondylolisthesis at C2-C3 through C4-C5, C7-T1. Chronic mostly levoconvex cervical scoliosis. Bilateral posterior element alignment is within normal limits. Skull base and vertebrae:  Visualized skull base is intact. No atlanto-occipital dissociation. C1 and C2 appear intact and aligned. No acute osseous abnormality identified. Soft tissues and spinal canal: No prevertebral fluid or swelling. No visible canal hematoma. Calcified carotid atherosclerosis in the neck. Disc levels: Multilevel chronic severe disc and endplate degeneration, relatively sparing C2-C3 and C4-C5. Developing degenerative ankylosis at C3-C4, possibly also C6-C7. multilevel facet hypertrophy. Multilevel chronic cervical spinal stenosis. No significant change identified since January. Upper chest: Similar chronic severe upper thoracic disc and endplate degeneration. Chronic benign T3 vertebral body hemangioma. Grossly stable, intact visible upper thoracic vertebrae, with chronic posterior element ankylosis at T3-T4. Negative lung apices. IMPRESSION: 1. No acute traumatic injury identified in the cervical spine. 2. Chronic severe cervical and upper thoracic spine degeneration. Chronic  multilevel cervical spinal stenosis. Electronically Signed   By: Odessa Fleming M.D.   On: 07/21/2023 10:33   CT Head Wo Contrast  Result Date: 07/21/2023 CLINICAL DATA:  89 year old male status post fall down stairs at 0630 hours. Right head hematoma. Pain. EXAM: CT HEAD WITHOUT CONTRAST TECHNIQUE: Contiguous axial images were obtained from the base of the skull through the vertex without intravenous contrast. RADIATION DOSE REDUCTION: This exam was performed according to the departmental dose-optimization program which includes automated exposure control, adjustment of the mA and/or kV according to patient size and/or use of iterative reconstruction technique. COMPARISON:  Brain MRI 08/23/2021.  Head CT 11/08/2022. FINDINGS: Brain: Scaphocephaly, normal variant. Stable cerebral volume. No midline shift, mass effect, or evidence of intracranial mass lesion. No ventriculomegaly. No acute intracranial hemorrhage identified. No cortically based acute  infarct identified. Stable gray-white matter differentiation throughout the brain. Mild for age white matter hypodensity. Some deep white matter capsule involvement. Vascular: Calcified atherosclerosis at the skull base. No suspicious intracranial vascular hyperdensity. Skull: Stable visualized osseous structures. No fracture identified. Sinuses/Orbits: Visualized paranasal sinuses and mastoids are stable and well aerated. Other: Right anterior scalp convexity hematoma measures up to 14 mm in thickness. This is superior and lateral to the right orbit soft tissues which appear spared. No underlying fracture identified there. No scalp soft tissue gas. IMPRESSION: 1. Right anterior scalp hematoma. No skull fracture or acute intracranial abnormality identified. 2. Stable mild for age chronic white matter changes. Electronically Signed   By: Odessa Fleming M.D.   On: 07/21/2023 10:29   DG Shoulder Right  Result Date: 07/21/2023 CLINICAL DATA:  Right shoulder pain after fall today. EXAM: RIGHT SHOULDER - 2+ VIEW COMPARISON:  None Available. FINDINGS: There is no evidence of fracture or dislocation. There is no evidence of arthropathy. Calcification is seen over greater tuberosity suggesting calcific tendinosis. Soft tissues are unremarkable. IMPRESSION: Possible calcific tendinosis.  No fracture or dislocation. Electronically Signed   By: Lupita Raider M.D.   On: 07/21/2023 09:38    Procedures Procedures    Medications Ordered in ED Medications - No data to display  ED Course/ Medical Decision Making/ A&P                                 Medical Decision Making Amount and/or Complexity of Data Reviewed Radiology: ordered.   Patient is a 89 year old male who presents after mechanical fall.  He has a hematoma to his right forehead and some mild right shoulder pain.  CT scan of the head and cervical spine show no acute abnormalities.  No intracranial hemorrhage.  No cervical spine fracture.  There is noted  hematoma on imaging.  X-rays of the right shoulder were interpreted by me and confirmed by the radiologist to show no evidence of fracture or dislocation.  Patient is otherwise well-appearing and at his baseline mental status.  He was discharged home in good condition.  On chart review, it looks like he is on Plavix for prior strokes.  I do not see that he had stents placed.  Advised him to hold his Plavix for the next 3 days and then restarted.  He and his daughter were given head injury precautions and return precautions.  Patient was discharged home in good condition.  Was advised to follow-up with her primary care doctor if his symptoms are improving.  Return precautions were given.  Final Clinical Impression(s) /  ED Diagnoses Final diagnoses:  Fall, initial encounter  Injury of head, initial encounter  Contusion of right shoulder, initial encounter    Rx / DC Orders ED Discharge Orders     None         Rolan Bucco, MD 07/21/23 1122

## 2023-07-21 NOTE — ED Triage Notes (Signed)
Fall down stairs this morning at 0630. Pt is on plavix. Pt has hematoma to right side of head. Additional c/o right shoulder pain.

## 2023-07-21 NOTE — Discharge Instructions (Signed)
Stop taking your Plavix for the next 3 days, then resume taking it.  You can use Tylenol as needed for pain.  Follow-up with your primary care doctor if your symptoms are not improving.  Return to the emergency room if you have any worsening symptoms.

## 2023-07-24 ENCOUNTER — Telehealth: Payer: Self-pay | Admitting: Adult Health

## 2023-07-24 DIAGNOSIS — S129XXS Fracture of neck, unspecified, sequela: Secondary | ICD-10-CM

## 2023-07-24 NOTE — Telephone Encounter (Signed)
Received telephone call from [facility] imaging radiologist Dr. Margo Aye regarding recent CT cervical spine.    Patient was evaluated in ED on 9/27 after mechanical fall, imaging during ED visit notes CTH right anterior scalp hematoma without skull fracture or acute intercranial abnormality and CT c-spine no acute traumatic injury identified.   CT c-spine re-reviewed by Dr. Margo Aye which he reports shows evidence of transverse process fracture of C7 on the right. Based on ED note, c/o some mild right shoulder pain with shoulder imaging negative, no c/o neck pain, neuro exam intact with normal sensation and motor function.   Please notify patient/family of the above, will refer to neurosurgery for further management recommendations.  During the interval time, if he should develop any significant neck pain or neurological symptoms (weakness, numbness/tingling), he should return to ED for further evaluation.

## 2023-07-24 NOTE — Telephone Encounter (Signed)
Called the number on file which is the daughter listed on DPR. Was able to review the imaging that was completed and advised her that Shanda Bumps recommends that he is referred to neuro surgery for further evaluation and treatment plan. Advised this referral has been placed and someone should be reaching out to get him scheduled.

## 2023-07-25 NOTE — Telephone Encounter (Signed)
Urgent referral sent to [facility], phone # [phone]. 

## 2023-08-02 DIAGNOSIS — S129XXA Fracture of neck, unspecified, initial encounter: Secondary | ICD-10-CM | POA: Diagnosis not present

## 2023-08-10 NOTE — Telephone Encounter (Signed)
Evaluated by neurosurgery Dr. Johnsie Cancel on 10/9.  Due to lack of neck pain, no intervention required and no further follow-up recommended.

## 2023-08-23 DIAGNOSIS — U071 COVID-19: Secondary | ICD-10-CM | POA: Diagnosis not present

## 2023-08-23 DIAGNOSIS — R051 Acute cough: Secondary | ICD-10-CM | POA: Diagnosis not present

## 2023-09-05 ENCOUNTER — Telehealth: Payer: Self-pay | Admitting: Adult Health

## 2023-09-05 NOTE — Telephone Encounter (Signed)
Pt's daughter, Erik Moss after  patient had Covid he began having hallucinations; he believe he is talking to [employer], and the attorney general. Sunday woke up went downstairs and the house smelled of gas and the gas was on. Erik Moss was asleep in the bed. He usually goes to the memory center but did not want to go the memory center. Would like a call back before 12pm or after 2 pm.  [name] is very bewildered, and upset at what is going on with her father.

## 2023-09-05 NOTE — Telephone Encounter (Signed)
Called the patient's daughter back. Pt prior to getting Covid was overall in good shape and doing well going to the memory care center twice a week. He was able to follow the news and tell what he did when he went to the memory care unit.  He got covid first week of November and overall did ok. She states over the last 2-3 days he has been sleeping a lot more and she said its like his " brain is broke". He is hallucinating, carrying on conversations ppl that aren't there and then there was the instance where he messed with the stove cutting the gas on and he had previously always knew the stove was off limits. Advised that this could be a change in mental status as a residual side effect from Covid and that it could be just taking his body a little longer than normal to recoup. It may slowly get better. Also it could be that this is a neuro change from the dementia and that he could be having a decline. Advised it would be worth to have him come on in for a follow up appointment and [place] repeat memory testing to compare to where he was as well as possibly discuss medication adjustments. Daughter verbalized understanding and was appreciative for the call back. Scheduled pt for the first available apt that I had and advised we would watch out for if a cancellation comes available. She is unable to come on tues and Thursdays.

## 2023-09-07 NOTE — Telephone Encounter (Signed)
Called the patient's daughter and was able to offer a sooner apt for next wed. She accepted.

## 2023-09-08 ENCOUNTER — Emergency Department (HOSPITAL_COMMUNITY): Payer: Medicare HMO

## 2023-09-08 ENCOUNTER — Inpatient Hospital Stay (HOSPITAL_COMMUNITY)
Admission: EM | Admit: 2023-09-08 | Discharge: 2023-09-14 | DRG: 640 | Disposition: A | Discharge status: Skilled Nursing Facility | Payer: Medicare HMO | Attending: Internal Medicine | Admitting: Internal Medicine

## 2023-09-08 ENCOUNTER — Other Ambulatory Visit: Payer: Self-pay

## 2023-09-08 DIAGNOSIS — R531 Weakness: Principal | ICD-10-CM

## 2023-09-08 DIAGNOSIS — E870 Hyperosmolality and hypernatremia: Secondary | ICD-10-CM | POA: Diagnosis present

## 2023-09-08 DIAGNOSIS — R404 Transient alteration of awareness: Secondary | ICD-10-CM | POA: Diagnosis not present

## 2023-09-08 DIAGNOSIS — E860 Dehydration: Secondary | ICD-10-CM | POA: Diagnosis not present

## 2023-09-08 DIAGNOSIS — Z8616 Personal history of COVID-19: Secondary | ICD-10-CM

## 2023-09-08 DIAGNOSIS — Z743 Need for continuous supervision: Secondary | ICD-10-CM | POA: Diagnosis not present

## 2023-09-08 DIAGNOSIS — N400 Enlarged prostate without lower urinary tract symptoms: Secondary | ICD-10-CM | POA: Diagnosis present

## 2023-09-08 DIAGNOSIS — I9589 Other hypotension: Secondary | ICD-10-CM | POA: Diagnosis present

## 2023-09-08 DIAGNOSIS — E1122 Type 2 diabetes mellitus with diabetic chronic kidney disease: Secondary | ICD-10-CM | POA: Diagnosis present

## 2023-09-08 DIAGNOSIS — R7989 Other specified abnormal findings of blood chemistry: Secondary | ICD-10-CM | POA: Diagnosis not present

## 2023-09-08 DIAGNOSIS — R262 Difficulty in walking, not elsewhere classified: Secondary | ICD-10-CM | POA: Diagnosis not present

## 2023-09-08 DIAGNOSIS — Z7984 Long term (current) use of oral hypoglycemic drugs: Secondary | ICD-10-CM

## 2023-09-08 DIAGNOSIS — G319 Degenerative disease of nervous system, unspecified: Secondary | ICD-10-CM | POA: Diagnosis not present

## 2023-09-08 DIAGNOSIS — N1832 Chronic kidney disease, stage 3b: Secondary | ICD-10-CM | POA: Diagnosis present

## 2023-09-08 DIAGNOSIS — I6782 Cerebral ischemia: Secondary | ICD-10-CM | POA: Diagnosis not present

## 2023-09-08 DIAGNOSIS — Z79899 Other long term (current) drug therapy: Secondary | ICD-10-CM

## 2023-09-08 DIAGNOSIS — R627 Adult failure to thrive: Secondary | ICD-10-CM | POA: Diagnosis present

## 2023-09-08 DIAGNOSIS — Z515 Encounter for palliative care: Secondary | ICD-10-CM

## 2023-09-08 DIAGNOSIS — N179 Acute kidney failure, unspecified: Secondary | ICD-10-CM | POA: Diagnosis present

## 2023-09-08 DIAGNOSIS — Z66 Do not resuscitate: Secondary | ICD-10-CM | POA: Diagnosis present

## 2023-09-08 DIAGNOSIS — F0390 Unspecified dementia without behavioral disturbance: Secondary | ICD-10-CM | POA: Diagnosis present

## 2023-09-08 DIAGNOSIS — F03A0 Unspecified dementia, mild, without behavioral disturbance, psychotic disturbance, mood disturbance, and anxiety: Secondary | ICD-10-CM | POA: Diagnosis present

## 2023-09-08 DIAGNOSIS — Z6821 Body mass index (BMI) 21.0-21.9, adult: Secondary | ICD-10-CM

## 2023-09-08 DIAGNOSIS — D631 Anemia in chronic kidney disease: Secondary | ICD-10-CM | POA: Diagnosis present

## 2023-09-08 DIAGNOSIS — Z751 Person awaiting admission to adequate facility elsewhere: Secondary | ICD-10-CM

## 2023-09-08 DIAGNOSIS — E8721 Acute metabolic acidosis: Secondary | ICD-10-CM | POA: Diagnosis present

## 2023-09-08 DIAGNOSIS — E861 Hypovolemia: Secondary | ICD-10-CM | POA: Diagnosis present

## 2023-09-08 DIAGNOSIS — E871 Hypo-osmolality and hyponatremia: Secondary | ICD-10-CM | POA: Diagnosis present

## 2023-09-08 DIAGNOSIS — I129 Hypertensive chronic kidney disease with stage 1 through stage 4 chronic kidney disease, or unspecified chronic kidney disease: Secondary | ICD-10-CM | POA: Diagnosis present

## 2023-09-08 DIAGNOSIS — E162 Hypoglycemia, unspecified: Secondary | ICD-10-CM | POA: Diagnosis not present

## 2023-09-08 DIAGNOSIS — G9341 Metabolic encephalopathy: Secondary | ICD-10-CM | POA: Diagnosis present

## 2023-09-08 DIAGNOSIS — E785 Hyperlipidemia, unspecified: Secondary | ICD-10-CM | POA: Diagnosis present

## 2023-09-08 DIAGNOSIS — R5383 Other fatigue: Secondary | ICD-10-CM | POA: Diagnosis not present

## 2023-09-08 DIAGNOSIS — Z8673 Personal history of transient ischemic attack (TIA), and cerebral infarction without residual deficits: Secondary | ICD-10-CM

## 2023-09-08 LAB — CBC
HCT: 36.7 % — ABNORMAL LOW (ref 39.0–52.0)
Hemoglobin: 11.1 g/dL — ABNORMAL LOW (ref 13.0–17.0)
MCH: 30.5 pg (ref 26.0–34.0)
MCHC: 30.2 g/dL (ref 30.0–36.0)
MCV: 100.8 fL — ABNORMAL HIGH (ref 80.0–100.0)
Platelets: 393 10*3/uL (ref 150–400)
RBC: 3.64 MIL/uL — ABNORMAL LOW (ref 4.22–5.81)
RDW: 14.3 % (ref 11.5–15.5)
WBC: 7.6 10*3/uL (ref 4.0–10.5)
nRBC: 0 % (ref 0.0–0.2)

## 2023-09-08 LAB — BASIC METABOLIC PANEL
Anion gap: 10 (ref 5–15)
BUN: 45 mg/dL — ABNORMAL HIGH (ref 8–23)
CO2: 25 mmol/L (ref 22–32)
Calcium: 9.2 mg/dL (ref 8.9–10.3)
Chloride: 115 mmol/L — ABNORMAL HIGH (ref 98–111)
Creatinine, Ser: 2.77 mg/dL — ABNORMAL HIGH (ref 0.61–1.24)
GFR, Estimated: 21 mL/min — ABNORMAL LOW (ref >60)
Glucose, Bld: 216 mg/dL — ABNORMAL HIGH (ref 70–99)
Potassium: 4.7 mmol/L (ref 3.5–5.1)
Sodium: 150 mmol/L — ABNORMAL HIGH (ref 135–145)

## 2023-09-08 LAB — URINALYSIS, ROUTINE W REFLEX MICROSCOPIC
Bacteria, UA: NONE SEEN
Bilirubin Urine: NEGATIVE
Glucose, UA: 50 mg/dL — AB
Hgb urine dipstick: NEGATIVE
Ketones, ur: NEGATIVE mg/dL
Leukocytes,Ua: NEGATIVE
Nitrite: NEGATIVE
Protein, ur: 30 mg/dL — AB
Specific Gravity, Urine: 1.021 (ref 1.005–1.030)
pH: 8 (ref 5.0–8.0)

## 2023-09-08 LAB — AMMONIA: Ammonia: 15 umol/L (ref 9–35)

## 2023-09-08 LAB — SODIUM, URINE, RANDOM: Sodium, Ur: 128 mmol/L

## 2023-09-08 LAB — CBG MONITORING, ED: Glucose-Capillary: 222 mg/dL — ABNORMAL HIGH (ref 70–99)

## 2023-09-08 MED ORDER — LACTATED RINGERS IV BOLUS
1000.0000 mL | Freq: Once | INTRAVENOUS | Status: AC
  Administered 2023-09-08: 1000 mL via INTRAVENOUS

## 2023-09-08 NOTE — ED Notes (Signed)
Unable to provide urine specimen att.

## 2023-09-08 NOTE — ED Provider Notes (Signed)
[place] EMERGENCY DEPARTMENT AT [facility] Provider Note   CSN: [id number] Arrival date & time: 09/08/23  1449     History {Add pertinent medical, surgical, social history, OB history to HPI:1} Chief Complaint  Patient presents with   Weakness    Erik Moss is a 89 y.o. male.  89 year old male with a history of mild dementia, diabetes, hypertension, hyperlipidemia, and stroke who presents to the emergency department with difficulty walking, confusion, and feeling tired for 3 days.  Patient's daughter accompanies him and says that he lives at home and was diagnosed with COVID on 1 November.  Had been recovering well until approximately 6 days ago when he started to get confused.  Thought that she was the [facility] and also has been unaware of where he is.  Typically is AOX3 despite his dementia.  Also noticed that he has been sleeping more often in the past few days and is now unable to walk.  Unsure if she had a stroke that she is not aware of and so she brought him into the emergency department for evaluation.  No fever, cough, shortness of breath, runny nose, sore throat, nausea, vomiting, or urinary symptoms.       Home Medications Prior to Admission medications   Medication Sig Start Date End Date Taking? Authorizing Provider  amLODipine (NORVASC) 5 MG tablet Take 5 mg by mouth daily.   Yes [provider]  atorvastatin (LIPITOR) 40 MG tablet Take 1 tablet (40 mg total) by mouth daily. 10/10/19 09/08/23 Yes [place], [name], DO  bisacodyl (DULCOLAX) 5 MG EC tablet Take 5 mg by mouth daily as needed for moderate constipation or mild constipation.   Yes [provider]  Cholecalciferol (D3 PO) Take 1 capsule by mouth daily.   Yes [provider]  diphenhydrAMINE (BENADRYL) 12.5 MG/5ML elixir Take 6.25 mg by mouth 4 (four) times daily as needed for allergies.   Yes [provider]  memantine (NAMENDA) 10 MG tablet Take 1  tablet (10 mg total) by mouth 2 (two) times daily. 12/05/22  Yes McCue, Shanda Bumps, NP  mirabegron ER (MYRBETRIQ) 50 MG TB24 tablet Take 50 mg by mouth daily.   Yes [provider]  sitaGLIPtin (JANUVIA) 25 MG tablet Take 25 mg by mouth daily.   Yes [provider]  sodium bicarbonate 650 MG tablet Take 650 mg by mouth 2 (two) times daily.   Yes [provider]  tamsulosin (FLOMAX) 0.4 MG CAPS capsule Take 0.4 mg by mouth at bedtime. 08/02/23  Yes [provider]  blood glucose meter kit and supplies Dispense based on patient and insurance preference. Use up to four times daily as directed. (FOR ICD-10 E10.9, E11.9). 09/25/20   Lanae Boast, MD      Allergies    Patient has no known allergies.    Review of Systems   Review of Systems  Physical Exam Updated Vital Signs BP (!) 97/55 (BP Location: Right Arm)   Pulse 79   Temp 98.3 F (36.8 C) (Oral)   Resp 16   SpO2 96%  Physical Exam Vitals and nursing note reviewed.  Constitutional:      General: He is not in acute distress.    Appearance: He is well-developed.     Comments: Alert and oriented x 2 (did not know date)  HENT:     Head: Normocephalic and atraumatic.     Right Ear: External ear normal.     Left Ear:  External ear normal.     Nose: Nose normal.  Eyes:     Extraocular Movements: Extraocular movements intact.     Conjunctiva/sclera: Conjunctivae normal.     Pupils: Pupils are equal, round, and reactive to light.  Neck:     Comments: No C-spine midline tenderness palpation Cardiovascular:     Rate and Rhythm: Normal rate and regular rhythm.     Heart sounds: Normal heart sounds.  Pulmonary:     Effort: Pulmonary effort is normal. No respiratory distress.     Breath sounds: Normal breath sounds.  Abdominal:     General: There is no distension.     Palpations: Abdomen is soft. There is no mass.     Tenderness: There is no abdominal tenderness. There is no guarding.  Musculoskeletal:      Cervical back: Normal range of motion and neck supple.     Right lower leg: No edema.     Left lower leg: No edema.  Skin:    General: Skin is warm and dry.  Neurological:     Mental Status: He is alert.     Comments: MENTAL STATUS: AAOx3 CRANIAL NERVES: II: Pupils equal and reactive 3 mm BL, no RAPD III, IV, VI: EOM intact, no gaze preference or deviation, no nystagmus. V: normal sensation to light touch in V1, V2, and V3 segments bilaterally VII: no facial weakness or asymmetry, no nasolabial fold flattening VIII: normal hearing to speech and finger friction IX, X: normal palatal elevation, no uvular deviation XI: 5/5 head turn and 5/5 shoulder shrug bilaterally XII: midline tongue protrusion MOTOR: 5/5 strength in R shoulder flexion, elbow flexion and extension, and grip strength. 5/5 strength in L shoulder flexion, elbow flexion and extension, and grip strength.  5/5 strength in R hip and knee flexion, knee extension, ankle plantar and dorsiflexion. 5/5 strength in L hip and knee flexion, knee extension, ankle plantar and dorsiflexion. SENSORY: Normal sensation to light touch in all extremities COORD: Abnormal heel-to-shin, no tremor, no dysmetria   Psychiatric:        Mood and Affect: Mood normal.        Behavior: Behavior normal.     ED Results / Procedures / Treatments   Labs (all labs ordered are listed, but only abnormal results are displayed) Labs Reviewed  BASIC METABOLIC PANEL - Abnormal; Notable for the following components:      Result Value   Sodium 150 (*)    Chloride 115 (*)    Glucose, Bld 216 (*)    BUN 45 (*)    Creatinine, Ser 2.77 (*)    GFR, Estimated 21 (*)    All other components within normal limits  CBC - Abnormal; Notable for the following components:   RBC 3.64 (*)    Hemoglobin 11.1 (*)    HCT 36.7 (*)    MCV 100.8 (*)    All other components within normal limits  CBG MONITORING, ED - Abnormal; Notable for the following components:    Glucose-Capillary 222 (*)    All other components within normal limits  URINALYSIS, ROUTINE W REFLEX MICROSCOPIC    EKG EKG Interpretation Date/Time:  Friday September 08 2023 14:58:52 EST Ventricular Rate:  81 PR Interval:  238 QRS Duration:  84 QT Interval:  374 QTC Calculation: 434 R Axis:   -53  Text Interpretation: Sinus rhythm with 1st degree A-V block Left axis deviation Abnormal ECG When compared with ECG of 22-Aug-2021 22:51, PREVIOUS ECG IS PRESENT  Confirmed by Vonita Moss [phone]) on 09/08/2023 6:17:11 PM  Radiology No results found.  Procedures Procedures  {Document cardiac monitor, telemetry assessment procedure when appropriate:1}  Medications Ordered in ED Medications  lactated ringers bolus 1,000 mL (has no administration in time range)    ED Course/ Medical Decision Making/ A&P Clinical Course as of 09/08/23 1818  Fri Sep 08, 2023  1816 Sodium(!): 150 [RP]    Clinical Course User Index [RP] Rondel Baton, MD   {   Click here for ABCD2, HEART and other calculatorsREFRESH Note before signing :1}                              Medical Decision Making Amount and/or Complexity of Data Reviewed Labs: ordered. Decision-making details documented in ED Course. Radiology: ordered.   ***  {Document critical care time when appropriate:1} {Document review of labs and clinical decision tools ie heart score, Chads2Vasc2 etc:1}  {Document your independent review of radiology images, and any outside records:1} {Document your discussion with family members, caretakers, and with consultants:1} {Document social determinants of health affecting pt's care:1} {Document your decision making why or why not admission, treatments were needed:1} Final Clinical Impression(s) / ED Diagnoses Final diagnoses:  None    Rx / DC Orders ED Discharge Orders     None

## 2023-09-08 NOTE — ED Provider Triage Note (Signed)
Emergency Medicine Provider Triage Evaluation Note  Moosa Klima Mathe , a 89 y.o. male  was evaluated in triage.  Pt complains of weakness.  Pt feels better with sitting  Review of Systems  Positive:  Negative: Pt states he has not eaten today  Physical Exam  BP (!) 97/55 (BP Location: Right Arm)   Pulse 79   Temp 98.3 F (36.8 C) (Oral)   Resp 16   SpO2 96%  Gen:   Awake, no distress   Resp:  Normal effort  MSK:   Moves extremities without difficulty  Other:    Medical Decision Making  Medically screening exam initiated at 3:54 PM.  Appropriate orders placed.  Jasten Duckwall Brozowski was informed that the remainder of the evaluation will be completed by another provider, this initial triage assessment does not replace that evaluation, and the importance of remaining in the ED until their evaluation is complete.     Elson Areas, [place] 09/08/23 1555

## 2023-09-08 NOTE — ED Triage Notes (Signed)
PT BIB [facility] from home with reports of feeling "tired" for 3 days. Pt with hx of dementia. Pt reports no complaints at this time.

## 2023-09-08 NOTE — ED Notes (Signed)
Patient transported to MRI 

## 2023-09-09 ENCOUNTER — Inpatient Hospital Stay (HOSPITAL_COMMUNITY): Payer: Medicare HMO

## 2023-09-09 DIAGNOSIS — Z8616 Personal history of COVID-19: Secondary | ICD-10-CM | POA: Diagnosis not present

## 2023-09-09 DIAGNOSIS — R4182 Altered mental status, unspecified: Secondary | ICD-10-CM | POA: Diagnosis not present

## 2023-09-09 DIAGNOSIS — R531 Weakness: Secondary | ICD-10-CM | POA: Diagnosis not present

## 2023-09-09 DIAGNOSIS — E785 Hyperlipidemia, unspecified: Secondary | ICD-10-CM | POA: Diagnosis not present

## 2023-09-09 DIAGNOSIS — Z789 Other specified health status: Secondary | ICD-10-CM | POA: Diagnosis not present

## 2023-09-09 DIAGNOSIS — Z8673 Personal history of transient ischemic attack (TIA), and cerebral infarction without residual deficits: Secondary | ICD-10-CM | POA: Diagnosis not present

## 2023-09-09 DIAGNOSIS — R569 Unspecified convulsions: Secondary | ICD-10-CM

## 2023-09-09 DIAGNOSIS — Z7189 Other specified counseling: Secondary | ICD-10-CM | POA: Diagnosis not present

## 2023-09-09 DIAGNOSIS — I10 Essential (primary) hypertension: Secondary | ICD-10-CM | POA: Diagnosis not present

## 2023-09-09 DIAGNOSIS — E1122 Type 2 diabetes mellitus with diabetic chronic kidney disease: Secondary | ICD-10-CM | POA: Diagnosis not present

## 2023-09-09 DIAGNOSIS — E8721 Acute metabolic acidosis: Secondary | ICD-10-CM | POA: Diagnosis not present

## 2023-09-09 DIAGNOSIS — N400 Enlarged prostate without lower urinary tract symptoms: Secondary | ICD-10-CM

## 2023-09-09 DIAGNOSIS — D638 Anemia in other chronic diseases classified elsewhere: Secondary | ICD-10-CM | POA: Diagnosis not present

## 2023-09-09 DIAGNOSIS — F03A0 Unspecified dementia, mild, without behavioral disturbance, psychotic disturbance, mood disturbance, and anxiety: Secondary | ICD-10-CM | POA: Diagnosis not present

## 2023-09-09 DIAGNOSIS — I9589 Other hypotension: Secondary | ICD-10-CM | POA: Diagnosis not present

## 2023-09-09 DIAGNOSIS — N179 Acute kidney failure, unspecified: Secondary | ICD-10-CM | POA: Diagnosis not present

## 2023-09-09 DIAGNOSIS — G9341 Metabolic encephalopathy: Secondary | ICD-10-CM | POA: Diagnosis not present

## 2023-09-09 DIAGNOSIS — R627 Adult failure to thrive: Secondary | ICD-10-CM | POA: Diagnosis not present

## 2023-09-09 DIAGNOSIS — R7989 Other specified abnormal findings of blood chemistry: Secondary | ICD-10-CM | POA: Diagnosis not present

## 2023-09-09 DIAGNOSIS — E7800 Pure hypercholesterolemia, unspecified: Secondary | ICD-10-CM | POA: Diagnosis not present

## 2023-09-09 DIAGNOSIS — Z7984 Long term (current) use of oral hypoglycemic drugs: Secondary | ICD-10-CM | POA: Diagnosis not present

## 2023-09-09 DIAGNOSIS — Z7401 Bed confinement status: Secondary | ICD-10-CM | POA: Diagnosis not present

## 2023-09-09 DIAGNOSIS — F0390 Unspecified dementia without behavioral disturbance: Secondary | ICD-10-CM | POA: Diagnosis not present

## 2023-09-09 DIAGNOSIS — E162 Hypoglycemia, unspecified: Secondary | ICD-10-CM | POA: Diagnosis not present

## 2023-09-09 DIAGNOSIS — Z79899 Other long term (current) drug therapy: Secondary | ICD-10-CM | POA: Diagnosis not present

## 2023-09-09 DIAGNOSIS — E119 Type 2 diabetes mellitus without complications: Secondary | ICD-10-CM | POA: Diagnosis not present

## 2023-09-09 DIAGNOSIS — I129 Hypertensive chronic kidney disease with stage 1 through stage 4 chronic kidney disease, or unspecified chronic kidney disease: Secondary | ICD-10-CM | POA: Diagnosis not present

## 2023-09-09 DIAGNOSIS — E860 Dehydration: Secondary | ICD-10-CM | POA: Diagnosis not present

## 2023-09-09 DIAGNOSIS — D631 Anemia in chronic kidney disease: Secondary | ICD-10-CM | POA: Diagnosis not present

## 2023-09-09 DIAGNOSIS — Z515 Encounter for palliative care: Secondary | ICD-10-CM | POA: Diagnosis not present

## 2023-09-09 DIAGNOSIS — E870 Hyperosmolality and hypernatremia: Secondary | ICD-10-CM | POA: Diagnosis not present

## 2023-09-09 DIAGNOSIS — R404 Transient alteration of awareness: Secondary | ICD-10-CM | POA: Diagnosis not present

## 2023-09-09 DIAGNOSIS — E871 Hypo-osmolality and hyponatremia: Secondary | ICD-10-CM | POA: Diagnosis not present

## 2023-09-09 DIAGNOSIS — Z711 Person with feared health complaint in whom no diagnosis is made: Secondary | ICD-10-CM | POA: Diagnosis not present

## 2023-09-09 DIAGNOSIS — Z66 Do not resuscitate: Secondary | ICD-10-CM | POA: Diagnosis not present

## 2023-09-09 DIAGNOSIS — Z6821 Body mass index (BMI) 21.0-21.9, adult: Secondary | ICD-10-CM | POA: Diagnosis not present

## 2023-09-09 DIAGNOSIS — N1832 Chronic kidney disease, stage 3b: Secondary | ICD-10-CM | POA: Diagnosis not present

## 2023-09-09 DIAGNOSIS — E861 Hypovolemia: Secondary | ICD-10-CM | POA: Diagnosis not present

## 2023-09-09 LAB — GLUCOSE, CAPILLARY
Glucose-Capillary: 150 mg/dL — ABNORMAL HIGH (ref 70–99)
Glucose-Capillary: 149 mg/dL — ABNORMAL HIGH (ref 70–99)
Glucose-Capillary: 165 mg/dL — ABNORMAL HIGH (ref 70–99)
Glucose-Capillary: 152 mg/dL — ABNORMAL HIGH (ref 70–99)

## 2023-09-09 LAB — BASIC METABOLIC PANEL
Anion gap: 11 (ref 5–15)
BUN: 40 mg/dL — ABNORMAL HIGH (ref 8–23)
CO2: 23 mmol/L (ref 22–32)
Calcium: 8.9 mg/dL (ref 8.9–10.3)
Chloride: 112 mmol/L — ABNORMAL HIGH (ref 98–111)
Creatinine, Ser: 2.53 mg/dL — ABNORMAL HIGH (ref 0.61–1.24)
GFR, Estimated: 24 mL/min — ABNORMAL LOW (ref >60)
Glucose, Bld: 214 mg/dL — ABNORMAL HIGH (ref 70–99)
Potassium: 4 mmol/L (ref 3.5–5.1)
Sodium: 146 mmol/L — ABNORMAL HIGH (ref 135–145)

## 2023-09-09 LAB — CBC WITH DIFFERENTIAL/PLATELET
Abs Immature Granulocytes: 0.03 10*3/uL (ref 0.00–0.07)
Basophils Absolute: 0.1 10*3/uL (ref 0.0–0.1)
Basophils Relative: 1 %
Eosinophils Absolute: 0.3 10*3/uL (ref 0.0–0.5)
Eosinophils Relative: 3 %
HCT: 34.1 % — ABNORMAL LOW (ref 39.0–52.0)
Hemoglobin: 10.6 g/dL — ABNORMAL LOW (ref 13.0–17.0)
Immature Granulocytes: 0 %
Lymphocytes Relative: 28 %
Lymphs Abs: 2.3 10*3/uL (ref 0.7–4.0)
MCH: 30.6 pg (ref 26.0–34.0)
MCHC: 31.1 g/dL (ref 30.0–36.0)
MCV: 98.6 fL (ref 80.0–100.0)
Monocytes Absolute: 0.6 10*3/uL (ref 0.1–1.0)
Monocytes Relative: 8 %
Neutro Abs: 5 10*3/uL (ref 1.7–7.7)
Neutrophils Relative %: 60 %
Platelets: 361 10*3/uL (ref 150–400)
RBC: 3.46 MIL/uL — ABNORMAL LOW (ref 4.22–5.81)
RDW: 14.3 % (ref 11.5–15.5)
WBC: 8.3 10*3/uL (ref 4.0–10.5)
nRBC: 0 % (ref 0.0–0.2)

## 2023-09-09 LAB — HEMOGLOBIN A1C
Hgb A1c MFr Bld: 8.6 % — ABNORMAL HIGH (ref 4.8–5.6)
Mean Plasma Glucose: 200.12 mg/dL

## 2023-09-09 LAB — OSMOLALITY: Osmolality: 324 mosm/kg (ref 275–295)

## 2023-09-09 MED ORDER — ALBUTEROL SULFATE (2.5 MG/3ML) 0.083% IN NEBU: 2.5000 mg | INHALATION_SOLUTION | RESPIRATORY_TRACT | Status: DC | PRN

## 2023-09-09 MED ORDER — ONDANSETRON HCL 4 MG/2ML IJ SOLN: 4.0000 mg | Freq: Four times a day (QID) | INTRAMUSCULAR | Status: DC | PRN

## 2023-09-09 MED ORDER — INSULIN ASPART 100 UNIT/ML IJ SOLN
0.0000 [IU] | Freq: Three times a day (TID) | INTRAMUSCULAR | Status: DC
  Administered 2023-09-09: 2 [IU] via SUBCUTANEOUS
  Administered 2023-09-09: 3 [IU] via SUBCUTANEOUS
  Administered 2023-09-09 – 2023-09-10 (×2): 2 [IU] via SUBCUTANEOUS
  Administered 2023-09-10 – 2023-09-11 (×2): 3 [IU] via SUBCUTANEOUS
  Administered 2023-09-11 (×2): 2 [IU] via SUBCUTANEOUS
  Administered 2023-09-12: 5 [IU] via SUBCUTANEOUS
  Administered 2023-09-12 – 2023-09-13 (×3): 3 [IU] via SUBCUTANEOUS
  Administered 2023-09-13 – 2023-09-14 (×3): 2 [IU] via SUBCUTANEOUS

## 2023-09-09 MED ORDER — ACETAMINOPHEN 650 MG RE SUPP: 650.0000 mg | Freq: Four times a day (QID) | RECTAL | Status: DC | PRN

## 2023-09-09 MED ORDER — LACTATED RINGERS IV SOLN: INTRAVENOUS | Status: DC

## 2023-09-09 MED ORDER — TAMSULOSIN HCL 0.4 MG PO CAPS
0.4000 mg | ORAL_CAPSULE | Freq: Every day | ORAL | Status: DC
  Administered 2023-09-09 – 2023-09-13 (×5): 0.4 mg via ORAL
  Filled 2023-09-09 (×5): qty 1

## 2023-09-09 MED ORDER — AMLODIPINE BESYLATE 5 MG PO TABS
5.0000 mg | ORAL_TABLET | Freq: Every day | ORAL | Status: DC
  Administered 2023-09-09 – 2023-09-14 (×5): 5 mg via ORAL
  Filled 2023-09-09 (×7): qty 1

## 2023-09-09 MED ORDER — SODIUM CHLORIDE 0.45 % IV SOLN: INTRAVENOUS | Status: AC

## 2023-09-09 MED ORDER — BISACODYL 5 MG PO TBEC: 5.0000 mg | DELAYED_RELEASE_TABLET | Freq: Every day | ORAL | Status: DC | PRN

## 2023-09-09 MED ORDER — MEMANTINE HCL 10 MG PO TABS
10.0000 mg | ORAL_TABLET | Freq: Two times a day (BID) | ORAL | Status: DC
  Administered 2023-09-09 – 2023-09-14 (×11): 10 mg via ORAL
  Filled 2023-09-09 (×11): qty 1

## 2023-09-09 MED ORDER — ONDANSETRON HCL 4 MG PO TABS: 4.0000 mg | ORAL_TABLET | Freq: Four times a day (QID) | ORAL | Status: DC | PRN

## 2023-09-09 MED ORDER — ACETAMINOPHEN 325 MG PO TABS: 650.0000 mg | ORAL_TABLET | Freq: Four times a day (QID) | ORAL | Status: DC | PRN

## 2023-09-09 MED ORDER — INSULIN ASPART 100 UNIT/ML IJ SOLN: 0.0000 [IU] | Freq: Every day | INTRAMUSCULAR | Status: DC

## 2023-09-09 MED ORDER — HEPARIN SODIUM (PORCINE) 5000 UNIT/ML IJ SOLN: 5000.0000 [IU] | Freq: Three times a day (TID) | INTRAMUSCULAR | Status: DC

## 2023-09-09 MED ORDER — HALOPERIDOL LACTATE 5 MG/ML IJ SOLN
2.0000 mg | Freq: Four times a day (QID) | INTRAMUSCULAR | Status: DC | PRN
  Administered 2023-09-10 (×2): 2 mg via INTRAVENOUS
  Filled 2023-09-09 (×2): qty 1

## 2023-09-09 MED ORDER — SODIUM BICARBONATE 650 MG PO TABS
650.0000 mg | ORAL_TABLET | Freq: Two times a day (BID) | ORAL | Status: DC
  Administered 2023-09-09 – 2023-09-14 (×11): 650 mg via ORAL
  Filled 2023-09-09 (×11): qty 1

## 2023-09-09 MED ORDER — ATORVASTATIN CALCIUM 40 MG PO TABS
40.0000 mg | ORAL_TABLET | Freq: Every day | ORAL | Status: DC
  Administered 2023-09-09 – 2023-09-14 (×6): 40 mg via ORAL
  Filled 2023-09-09 (×6): qty 1

## 2023-09-09 MED ORDER — HEPARIN SODIUM (PORCINE) 5000 UNIT/ML IJ SOLN
5000.0000 [IU] | Freq: Three times a day (TID) | INTRAMUSCULAR | Status: DC
  Administered 2023-09-09 – 2023-09-14 (×15): 5000 [IU] via SUBCUTANEOUS
  Filled 2023-09-09 (×15): qty 1

## 2023-09-09 MED ORDER — MIRABEGRON ER 25 MG PO TB24
50.0000 mg | ORAL_TABLET | Freq: Every day | ORAL | Status: DC
  Administered 2023-09-09 – 2023-09-14 (×6): 50 mg via ORAL
  Filled 2023-09-09 (×6): qty 2

## 2023-09-09 NOTE — ED Notes (Addendum)
ED TO INPATIENT HANDOFF REPORT  ED Nurse Name and Phone #: Riki Rusk, RN   S Name/Age/Gender Erik Moss Battin 89 y.o. male Room/Bed: H013C/H013C  Code Status   Code Status: Prior  Home/SNF/Other Home Patient oriented to: self, place, and situation Is this baseline? Yes   Triage Complete: Triage complete  Chief Complaint Hypernatremia [E87.0]  Triage Note PT BIB [facility] from home with reports of feeling "tired" for 3 days. Pt with hx of dementia. Pt reports no complaints at this time.    Allergies No Known Allergies  Level of Care/Admitting Diagnosis ED Disposition     ED Disposition  Admit   Condition  --   Comment  Hospital Area: [facility] [[id number]]  Level of Care: Med-Surg [16]  May admit patient to [facility] or [facility] if equivalent level of care is available:: Yes  Covid Evaluation: Asymptomatic - no recent exposure (last 10 days) testing not required  Diagnosis: Hypernatremia [093235]  Admitting Physician: Gery Pray [[id number]]  Attending Physician: Gery Pray [[id number]]  Certification:: I certify this patient will need inpatient services for at least 2 midnights  Expected Medical Readiness: 09/11/2023          B Medical/Surgery History Past Medical History:  Diagnosis Date   BPH (benign prostatic hyperplasia) 10/08/2019   Diabetes mellitus without complication (HCC)    Hyperlipidemia 10/08/2019   Hypertension 10/08/2019   Renal disease    Stroke [facility])    TIA (transient ischemic attack)    Type 2 diabetes mellitus (HCC) 10/08/2019   Past Surgical History:  Procedure Laterality Date   BUBBLE STUDY  10/10/2019   Procedure: BUBBLE STUDY;  Surgeon: Sande Rives, MD;  Location: [facility] END[facility];  Service: Cardiology;;   TEE WITHOUT CARDIOVERSION N/A 10/10/2019   Procedure: TRANSESOPHAGEAL ECHOCARDIOGRAM (TEE);  Surgeon: Sande Rives, MD;  Location: [facility] ENDOSCOPY;  Service: Cardiology;  Laterality: N/A;     A IV  Location/Drains/Wounds Patient Lines/Drains/Airways Status     Active Line/Drains/Airways     Name Placement date Placement time Site Days   Peripheral IV 09/08/23 22 G 1" Left;Posterior Hand 09/08/23  1958  Hand  1            Intake/Output Last 24 hours No intake or output data in the 24 hours ending 09/09/23 0325  Labs/Imaging Results for orders placed or performed during the hospital encounter of 09/08/23 (from the past 48 hour(s))  Basic metabolic panel     Status: Abnormal   Collection Time: 09/08/23  2:52 PM  Result Value Ref Range   Sodium 150 (H) 135 - 145 mmol/L   Potassium 4.7 3.5 - 5.1 mmol/L   Chloride 115 (H) 98 - 111 mmol/L   CO2 25 22 - 32 mmol/L   Glucose, Bld 216 (H) 70 - 99 mg/dL    Comment: Glucose reference range applies only to samples taken after fasting for at least 8 hours.   BUN 45 (H) 8 - 23 mg/dL   Creatinine, Ser 5.73 (H) 0.61 - 1.24 mg/dL   Calcium 9.2 8.9 - 22.0 mg/dL   GFR, Estimated 21 (L) >60 mL/min    Comment: (NOTE) Calculated using the CKD-EPI Creatinine Equation (2021)    Anion gap 10 5 - 15    Comment: Performed at [facility], [address], [place], [place] [address]  CBC     Status: Abnormal   Collection Time: 09/08/23  2:52 PM  Result Value Ref Range  WBC 7.6 4.0 - 10.5 K/uL   RBC 3.64 (L) 4.22 - 5.81 MIL/uL   Hemoglobin 11.1 (L) 13.0 - 17.0 g/dL   HCT 21.3 (L) 08.6 - 57.8 %   MCV 100.8 (H) 80.0 - 100.0 fL   MCH 30.5 26.0 - 34.0 pg   MCHC 30.2 30.0 - 36.0 g/dL   RDW 46.9 62.9 - 52.8 %   Platelets 393 150 - 400 K/uL   nRBC 0.0 0.0 - 0.2 %    Comment: Performed at [facility], [address] [place], [place] [address]  CBG monitoring, ED     Status: Abnormal   Collection Time: 09/08/23  4:13 PM  Result Value Ref Range   Glucose-Capillary 222 (H) 70 - 99 mg/dL    Comment: Glucose reference range applies only to samples taken after fasting for at least 8 hours.  Ammonia     Status: None   Collection Time:  09/08/23  7:58 PM  Result Value Ref Range   Ammonia 15 9 - 35 umol/L    Comment: Performed at [facility], [address], [place], [place] [address]  Urinalysis, Routine w reflex microscopic -Urine, Clean Catch     Status: Abnormal   Collection Time: 09/08/23 10:16 PM  Result Value Ref Range   Color, Urine YELLOW YELLOW   APPearance CLEAR CLEAR   Specific Gravity, Urine 1.021 1.005 - 1.030   pH 8.0 5.0 - 8.0   Glucose, UA 50 (A) NEGATIVE mg/dL   Hgb urine dipstick NEGATIVE NEGATIVE   Bilirubin Urine NEGATIVE NEGATIVE   Ketones, ur NEGATIVE NEGATIVE mg/dL   Protein, ur 30 (A) NEGATIVE mg/dL   Nitrite NEGATIVE NEGATIVE   Leukocytes,Ua NEGATIVE NEGATIVE   RBC / HPF 0-5 0 - 5 RBC/hpf   WBC, UA 0-5 0 - 5 WBC/hpf   Bacteria, UA NONE SEEN NONE SEEN   Squamous Epithelial / HPF 0-5 0 - 5 /HPF   Mucus PRESENT    Hyaline Casts, UA PRESENT    Triple Phosphate Crystal PRESENT     Comment: Performed at [facility], [address] [place], [place] [address]  Sodium, urine, random     Status: None   Collection Time: 09/08/23 10:16 PM  Result Value Ref Range   Sodium, Ur 128 mmol/L    Comment: Performed at [facility], [address], [place], [place] [address]   MR BRAIN WO CONTRAST  Result Date: 09/08/2023 CLINICAL DATA:  Initial evaluation for altered mental status, difficulty walking. EXAM: MRI HEAD WITHOUT CONTRAST TECHNIQUE: Multiplanar, multiecho pulse sequences of the brain and surrounding structures were obtained without intravenous contrast. COMPARISON:  CT from 07/21/2023 and MRI from 08/23/2021. FINDINGS: Brain: Diffuse prominence of the CSF containing spaces compatible generalized cerebral atrophy, moderate in nature. Patchy T2/FLAIR hyperintensity involving the periventricular and deep white matter both cerebral hemispheres as well as the pons, consistent with chronic small vessel ischemic disease, mild-to-moderate in nature. No evidence for acute or subacute  ischemia. Gray-white matter differentiation maintained. No acute intracranial hemorrhage. Small focus of chronic hemosiderin staining noted at the anterior right frontal lobe (series 7, image 67). No associated T1 or FLAIR signal intensity seen at this location to suggest acute blood products. No mass lesion, midline shift or mass effect. No hydrocephalus or extra-axial fluid collection. Partially empty sella noted. Vascular: Major intracranial vascular flow voids are maintained. Skull and upper cervical spine: Craniocervical junction within normal limits. Degenerative thickening about the  tectorial membrane noted. Moderate spondylosis within the visualized upper cervical spine. Bone marrow signal intensity within normal limits. No scalp soft tissue abnormality. Sinuses/Orbits: Prior bilateral ocular lens replacement. Scattered mucosal thickening present throughout the frontoethmoidal and maxillary sinuses. No significant mastoid effusion. Other: None. IMPRESSION: 1. No acute intracranial abnormality. 2. Moderate cerebral atrophy with mild-to-moderate chronic microvascular ischemic disease. Electronically Signed   By: Rise Mu M.D.   On: 09/08/2023 22:07   DG Chest 2 View  Result Date: 09/08/2023 CLINICAL DATA:  Altered level of consciousness, tired for 3 days EXAM: CHEST - 2 VIEW COMPARISON:  09/24/2020 FINDINGS: The heart size and mediastinal contours are within normal limits. Both lungs are clear. The visualized skeletal structures are unremarkable. IMPRESSION: No active cardiopulmonary disease. Electronically Signed   By: Sharlet Salina M.D.   On: 09/08/2023 20:25    Pending Labs Unresulted Labs (From admission, onward)     Start     Ordered   09/09/23 0600  Basic metabolic panel  Once-Timed,   STAT        09/09/23 0237   09/09/23 0600  CBC with Differential/Platelet  Once-Timed,   TIMED        09/09/23 0247   09/09/23 0237  Hemoglobin A1c  Once,   URGENT       Comments: To assess  prior glycemic control    09/09/23 0237   09/08/23 2302  Osmolality  Add-on,   AD        09/08/23 2301            Vitals/Pain Today's Vitals   09/08/23 2004 09/08/23 2027 09/08/23 2351 09/09/23 0252  BP:  (!) 151/86  134/71  Pulse:  78  75  Resp:  15  15  Temp:  98.3 F (36.8 C)  98 F (36.7 C)  TempSrc:    Oral  SpO2:  100%  98%  Weight: 140 lb (63.5 kg)     Height: 5\' 6"  (1.676 m)     PainSc:  0-No pain 0-No pain     Isolation Precautions No active isolations  Medications Medications  insulin aspart (novoLOG) injection 0-15 Units (has no administration in time range)  insulin aspart (novoLOG) injection 0-5 Units (has no administration in time range)  lactated ringers infusion (has no administration in time range)  lactated ringers bolus 1,000 mL (0 mLs Intravenous Stopped 09/08/23 2351)    Mobility walks with person assist     Focused Assessments     R Recommendations: See Admitting Provider Note  Report given to:   Additional Notes: Arrives with c/o generalized weakness for several days. Hx of dementia. Pt found to be hypernatremic and recommendations for admission and further evaluation.   Received MRI, CXR in ED which were normal.

## 2023-09-09 NOTE — Progress Notes (Signed)
EEG complete - results pending 

## 2023-09-09 NOTE — Evaluation (Signed)
Physical Therapy Evaluation Patient Details Name: Erik Moss MRN: [id number] DOB: Oct 23, 1933 Today's Date: 09/09/2023  History of Present Illness  Pt is a 89 y.o. M who presents 09/08/2023 with weakness. Found to have hyponatremia and AKI. Significant PMH: CVA, HTN, CKD stage IIIb, HLD, DM-2, dementia, BPH.  Clinical Impression  Pt admitted with above diagnosis. Pt pleasantly confused; follows one step commands inconsistently. Pt requiring min-mod assist for functional mobility. Ambulating 60 ft with a walker, with significant difficulty negotiating around obstacles and controlling forward momentum and walker. Pt at high risk for falls based on decreased gait speed and safety awareness. Pt currently with functional limitations due to the deficits listed below (see PT Problem List). Patient will benefit from continued inpatient follow up therapy, <3 hours/day.       If plan is discharge home, recommend the following: A little help with bathing/dressing/bathroom;A lot of help with walking and/or transfers;Assistance with cooking/housework;Direct supervision/assist for medications management;Direct supervision/assist for financial management;Assist for transportation;Help with stairs or ramp for entrance   Can travel by private vehicle   Yes    Equipment Recommendations None recommended by PT  Recommendations for Other Services       Functional Status Assessment Patient has had a recent decline in their functional status and demonstrates the ability to make significant improvements in function in a reasonable and predictable amount of time.     Precautions / Restrictions Precautions Precautions: Fall Restrictions Weight Bearing Restrictions: No      Mobility  Bed Mobility Overal bed mobility: Needs Assistance Bed Mobility: Supine to Sit     Supine to sit: Min assist     General bed mobility comments: Assist for guidance    Transfers Overall transfer level: Needs  assistance Equipment used: Rolling walker (2 wheels), None Transfers: Sit to/from Stand Sit to Stand: Min assist           General transfer comment: MinA to rise and initially steady, tendency for initial posterior lean    Ambulation/Gait Ambulation/Gait assistance: Min assist, Mod assist Gait Distance (Feet): 60 Feet Assistive device: Rolling walker (2 wheels) Gait Pattern/deviations: Step-through pattern, Decreased stride length, Trunk flexed Gait velocity: decreased Gait velocity interpretation: <1.31 ft/sec, indicative of household ambulator   General Gait Details: Difficulty controlling forward momentum and negotiating around obstacles with poor proximity to walker. Requiring min-modA to stabilize  Stairs            Wheelchair Mobility     Tilt Bed    Modified Rankin (Stroke Patients Only)       Balance Overall balance assessment: Needs assistance Sitting-balance support: Feet supported Sitting balance-Leahy Scale: Fair     Standing balance support: Bilateral upper extremity supported Standing balance-Leahy Scale: Poor                               Pertinent Vitals/Pain Pain Assessment Pain Assessment: No/denies pain    Home Living Family/patient expects to be discharged to:: Skilled nursing facility                        Prior Function Prior Level of Function : Patient poor historian/Family not available             Mobility Comments: per chart review, tpt independent with mobility ADLs Comments: per chart review, pt requiring increased assist with ADL's recently     Extremity/Trunk Assessment   Upper Extremity Assessment  Upper Extremity Assessment: Defer to OT evaluation    Lower Extremity Assessment Lower Extremity Assessment: Overall WFL for tasks assessed    Cervical / Trunk Assessment Cervical / Trunk Assessment: Kyphotic  Communication   Communication Communication: No apparent difficulties   Cognition Arousal: Alert Behavior During Therapy: WFL for tasks assessed/performed Overall Cognitive Status: History of cognitive impairments - at baseline                                 General Comments: Hx dementia; pt perseverative, however, pleasant. follows 1 step commands inconsistently, difficulty with sequencing        General Comments      Exercises     Assessment/Plan    PT Assessment Patient needs continued PT services  PT Problem List Decreased strength;Decreased activity tolerance;Decreased balance;Decreased mobility;Decreased cognition;Decreased safety awareness       PT Treatment Interventions DME instruction;Gait training;Functional mobility training;Therapeutic activities;Therapeutic exercise;Balance training;Patient/family education    PT Goals (Current goals can be found in the Care Plan section)  Acute Rehab PT Goals Patient Stated Goal: did not state PT Goal Formulation: With patient Time For Goal Achievement: 09/23/23 Potential to Achieve Goals: Fair    Frequency Min 1X/week     Co-evaluation               AM-PAC PT "6 Clicks" Mobility  Outcome Measure Help needed turning from your back to your side while in a flat bed without using bedrails?: A Little Help needed moving from lying on your back to sitting on the side of a flat bed without using bedrails?: A Little Help needed moving to and from a bed to a chair (including a wheelchair)?: A Little Help needed standing up from a chair using your arms (e.g., wheelchair or bedside chair)?: A Little Help needed to walk in hospital room?: A Lot Help needed climbing 3-5 steps with a railing? : Total 6 Click Score: 15    End of Session Equipment Utilized During Treatment: Gait belt Activity Tolerance: Patient tolerated treatment well Patient left: in chair;with call bell/phone within reach;with chair alarm set Nurse Communication: Mobility status PT Visit Diagnosis: Unsteadiness  on feet (R26.81)    Time: 1610-9604 PT Time Calculation (min) (ACUTE ONLY): 21 min   Charges:   PT Evaluation $PT Eval Low Complexity: 1 Low   PT General Charges $$ ACUTE PT VISIT: 1 Visit         Lillia Pauls, PT, DPT Acute Rehabilitation Services Office [phone]   Norval Morton 09/09/2023, 2:32 PM

## 2023-09-09 NOTE — Progress Notes (Signed)
Dr, Joneen Roach was informed that patient had arrived to unit and awaiting orders, I was informed to call patient placement.I called patient placement as instructed and was informed to chat with MD I informed patient placement that I did and was informed that patient placement would get in touch again with MD

## 2023-09-09 NOTE — Progress Notes (Signed)
Dr. Joneen Roach was informed via chat that patient has arrived to the unit

## 2023-09-09 NOTE — Plan of Care (Signed)
  Problem: Education: Goal: Knowledge of General Education information will improve Description Including pain rating scale, medication(s)/side effects and non-pharmacologic comfort measures Outcome: Progressing   Problem: Health Behavior/Discharge Planning: Goal: Ability to manage health-related needs will improve Outcome: Progressing   

## 2023-09-09 NOTE — Procedures (Signed)
Patient Name: Erik Moss  MRN: [id number]  Epilepsy Attending: Charlsie Quest  Referring Physician/Provider: Maretta Bees, MD  Date: 09/09/2023 Duration: 26.19 mins  Patient history: 89yo M with ams getting eeg to evaluate for seizure.  Level of alertness: Awake  AEDs during EEG study: None  Technical aspects: This EEG study was done with scalp electrodes positioned according to the 10-20 International system of electrode placement. Electrical activity was reviewed with band pass filter of 1-70Hz , sensitivity of 7 uV/mm, display speed of 68mm/sec with a 60Hz  notched filter applied as appropriate. EEG data were recorded continuously and digitally stored.  Video monitoring was available and reviewed as appropriate.  Description: The posterior dominant rhythm consists of 7 Hz activity of moderate voltage (25-35 uV) seen predominantly in posterior head regions, symmetric and reactive to eye opening and eye closing. EEG showed continuous generalized 5 to 6 Hz theta slowing. Hyperventilation and photic stimulation were not performed.     Of note, study was technically difficult due to significant myogenic artifact.  ABNORMALITY - Continuous slow, generalized  IMPRESSION: This technically difficult study is suggestive of mild to moderate diffuse encephalopathy. No seizures or epileptiform discharges were seen throughout the recording.  [name]

## 2023-09-09 NOTE — Progress Notes (Signed)
Critical value Osmolarity 324 reported via chat Dr. Joneen Roach

## 2023-09-09 NOTE — Progress Notes (Signed)
Patient arrived to room. Alert and oriented. VSS. Assessment completed. Admission questions attempted. Call bell within reach and bed alarm on. Patient resting comfortably.

## 2023-09-09 NOTE — H&P (Addendum)
History and Physical    Patient: Erik Moss KGM:[id number] DOB: 1933-01-17 DOA: 09/08/2023 DOS: the patient was seen and examined on 09/09/2023 PCP: Harvest Forest, MD  Patient coming from: Home  Chief Complaint:  Chief Complaint  Patient presents with   Weakness   HPI: Erik Moss is a 89 y.o. male with medical history significant of CKD stage IIIb, HTN, HLD, DM-2, dementia, prior history of CVA, BPH who presented to the hospital with the above-noted complaints.  Please note-patient is somewhat confused-most of this history is obtained by reviewing ED chart and speaking with the patient's daughter Erik Moss over the phone.  Apparently during the first week of November-patient was diagnosed with COVID-19 infection-he subsequently completely recovered-and was doing well-however for the past several days-he has been noted to have very poor oral intake-weakness and has been sleeping most the daytime.  He has also gotten very weak-and now needs more assistance from family for daily activities of living.  Prior to this acute event-patient was relatively independent-although he has dementia-he is high functioning when he is in a familiar environment.  Patient was subsequently evaluated in the emergency room for the above-noted complaints-found to have hyponatremia, AKI-thought to have acute metabolic encephalopathy superimposed on his dementia and subsequently admitted to the hospitalist service.  From the history obtained- No fever, no headaches No shortness of breath No chest pain No nausea, vomiting or diarrhea No abdominal pain No hematuria No hematochezia   Review of Systems: As mentioned in the history of present illness. All other systems reviewed and are negative. Past Medical History:  Diagnosis Date   BPH (benign prostatic hyperplasia) 10/08/2019   Diabetes mellitus without complication (HCC)    Hyperlipidemia 10/08/2019   Hypertension 10/08/2019   Renal  disease    Stroke [facility])    TIA (transient ischemic attack)    Type 2 diabetes mellitus (HCC) 10/08/2019   Past Surgical History:  Procedure Laterality Date   BUBBLE STUDY  10/10/2019   Procedure: BUBBLE STUDY;  Surgeon: Sande Rives, MD;  Location: [facility] ENDOSCOPY;  Service: Cardiology;;   TEE WITHOUT CARDIOVERSION N/A 10/10/2019   Procedure: TRANSESOPHAGEAL ECHOCARDIOGRAM (TEE);  Surgeon: Sande Rives, MD;  Location: [facility] ENDOSCOPY;  Service: Cardiology;  Laterality: N/A;   Social History:  reports that he has never smoked. He has never used smokeless tobacco. He reports that he does not drink alcohol and does not use drugs.  No Known Allergies  Family History  Problem Relation Age of Onset   Other Mother        Passed from "old age"   Other Father        Accident    Prior to Admission medications   Medication Sig Start Date End Date Taking? Authorizing Provider  amLODipine (NORVASC) 5 MG tablet Take 5 mg by mouth daily.   Yes [provider]  atorvastatin (LIPITOR) 40 MG tablet Take 1 tablet (40 mg total) by mouth daily. 10/10/19 09/08/23 Yes [place], [name], DO  bisacodyl (DULCOLAX) 5 MG EC tablet Take 5 mg by mouth daily as needed for moderate constipation or mild constipation.   Yes [provider]  Cholecalciferol (D3 PO) Take 1 capsule by mouth daily.   Yes [provider]  diphenhydrAMINE (BENADRYL) 12.5 MG/5ML elixir Take 6.25 mg by mouth 4 (four) times daily as needed for allergies.   Yes [provider]  memantine (NAMENDA) 10 MG tablet Take 1 tablet (10 mg total) by mouth 2 (two) times  daily. 12/05/22  Yes McCue, Shanda Bumps, NP  mirabegron ER (MYRBETRIQ) 50 MG TB24 tablet Take 50 mg by mouth daily.   Yes [provider]  sitaGLIPtin (JANUVIA) 25 MG tablet Take 25 mg by mouth daily.   Yes [provider]  sodium bicarbonate 650 MG tablet Take 650 mg by mouth 2 (two) times daily.   Yes [provider]   tamsulosin (FLOMAX) 0.4 MG CAPS capsule Take 0.4 mg by mouth at bedtime. 08/02/23  Yes [provider]  blood glucose meter kit and supplies Dispense based on patient and insurance preference. Use up to four times daily as directed. (FOR ICD-10 E10.9, E11.9). 09/25/20   Erik Boast, MD    Physical Exam: Vitals:   09/08/23 2004 09/08/23 2027 09/09/23 0252 09/09/23 0400  BP:  (!) 151/86 134/71 (!) 156/85  Pulse:  78 75 75  Resp:  15 15 16   Temp:  98.3 F (36.8 C) 98 F (36.7 C) 97.8 F (36.6 C)  TempSrc:   Oral Oral  SpO2:  100% 98% 100%  Weight: 63.5 kg   60 kg  Height: 5\' 6"  (1.676 m)      Gen Exam: Confused-somewhat redirectable at times-not in any distress HEENT:atraumatic, normocephalic Chest: B/L clear to auscultation anteriorly CVS:S1S2 regular Abdomen:soft non tender, non distended Extremities:no edema Neurology: Non focal Skin: no rash  Data Reviewed:     Latest Ref Rng & Units 09/09/2023    4:32 AM 09/08/2023    2:52 PM 08/22/2021   11:01 PM  CBC  WBC 4.0 - 10.5 K/uL 8.3  7.6    Hemoglobin 13.0 - 17.0 g/dL 09.8  11.9  14.7   Hematocrit 39.0 - 52.0 % 34.1  36.7  34.0   Platelets 150 - 400 K/uL 361  393         Latest Ref Rng & Units 09/09/2023    4:32 AM 09/08/2023    2:52 PM 08/22/2021   11:01 PM  BMP  Glucose 70 - 99 mg/dL 829  562  93   BUN 8 - 23 mg/dL 40  45  45   Creatinine 0.61 - 1.24 mg/dL 1.30  8.65  7.84   Sodium 135 - 145 mmol/L 146  150  139   Potassium 3.5 - 5.1 mmol/L 4.0  4.7  4.9   Chloride 98 - 111 mmol/L 112  115  109   CO2 22 - 32 mmol/L 23  25    Calcium 8.9 - 10.3 mg/dL 8.9  9.2      MRI brain: No acute CVA  Twelve-lead EKG: Normal sinus rhythm  Assessment and Plan: Acute metabolic encephalopathy-superimposed on dementia Etiology of encephalopathy likely due to hyponatremia/AKI/dehydration MRI brain without any acute abnormalities UA-no UTI, CXR no pneumonia-does not clinically appear to have an active infection as  the etiology. Obtain ammonia/B12 with a.m. labs-routine Spot EEG-although doubt these are potential etiologies. Plan is to continue to treat underlying etiologies and hopefully he will slowly improve. Long discussion with daughter at bedside-given his history of dementia-although his encephalopathy will improve-he will always be at risk of some superimposed delirium during this hospital stay  Hypernatremia Secondary to dehydration/poor oral intake Gently hydrate with half-normal saline today-recheck electrolytes tomorrow  AKI on CKD stage IIIb Likely hemodynamically mediated acute kidney injury in the setting of poor oral intake/recent COVID-19 infection UA with minimal proteinuria Gently hydrate-avoid nephrotoxic agents Frequent bladder scans to ensure no retention Recheck electrolytes tomorrow  Borderline hypotension Likely due to  dehydration/poor oral intake-reportedly initial blood pressure was in the 90s systolic-but this has not improved with IV fluids. No indication of sepsis-ruled out.  Worsening debility/deconditioning Probably multifactorial-due to dehydration/electrolyte abnormalities-superimposed on age-related debility/deconditioning Neuroimaging negative for any significant acute abnormalities Continue to treat underlying electrolyte abnormalities-and work with PT/OT Probably will require a short trip to SNF for subacute rehab.  Recent COVID-19 infection Apparently diagnosed 11/1 Currently asymptomatic-on room air-CXR without pneumonia Since > 2 weeks out-does not require isolation  DM-2 Hold oral hypoglycemics-start SSI/follow CBGs  HTN Resume amlodipine and follow BP trend  HLD Statin  BPH Resume Flomax Frequent bladder scans to ensure no retention  Dementia Highly functional in home environment per daughter At risk of delirium during this hospitalization Resume Namenda As needed Haldol for severe delirium    Advance Care Planning:   Code Status:  Limited: Do not attempt resuscitation (DNR) -DNR-LIMITED -Do Not Intubate/DNI (confirmed with daughter Erik Moss over the phone)  Consults: None  Family Communication: Daughter-Paul over the phone  Severity of Illness: The appropriate patient status for this patient is INPATIENT. Inpatient status is judged to be reasonable and necessary in order to provide the required intensity of service to ensure the patient's safety. The patient's presenting symptoms, physical exam findings, and initial radiographic and laboratory data in the context of their chronic comorbidities is felt to place them at high risk for further clinical deterioration. Furthermore, it is not anticipated that the patient will be medically stable for discharge from the hospital within 2 midnights of admission.   * I certify that at the point of admission it is my clinical judgment that the patient will require inpatient hospital care spanning beyond 2 midnights from the point of admission due to high intensity of service, high risk for further deterioration and high frequency of surveillance required.*  Author: Jeoffrey Massed, MD 09/09/2023 7:37 AM  For on call review [email or web address].

## 2023-09-10 DIAGNOSIS — Z515 Encounter for palliative care: Secondary | ICD-10-CM

## 2023-09-10 DIAGNOSIS — Z7189 Other specified counseling: Secondary | ICD-10-CM

## 2023-09-10 DIAGNOSIS — E870 Hyperosmolality and hypernatremia: Secondary | ICD-10-CM

## 2023-09-10 DIAGNOSIS — F0390 Unspecified dementia without behavioral disturbance: Secondary | ICD-10-CM | POA: Diagnosis not present

## 2023-09-10 LAB — CBC
HCT: 32.8 % — ABNORMAL LOW (ref 39.0–52.0)
Hemoglobin: 10.3 g/dL — ABNORMAL LOW (ref 13.0–17.0)
MCH: 30.2 pg (ref 26.0–34.0)
MCHC: 31.4 g/dL (ref 30.0–36.0)
MCV: 96.2 fL (ref 80.0–100.0)
Platelets: 323 10*3/uL (ref 150–400)
RBC: 3.41 MIL/uL — ABNORMAL LOW (ref 4.22–5.81)
RDW: 13.8 % (ref 11.5–15.5)
WBC: 8.6 10*3/uL (ref 4.0–10.5)
nRBC: 0 % (ref 0.0–0.2)

## 2023-09-10 LAB — GLUCOSE, CAPILLARY
Glucose-Capillary: 153 mg/dL — ABNORMAL HIGH (ref 70–99)
Glucose-Capillary: 122 mg/dL — ABNORMAL HIGH (ref 70–99)
Glucose-Capillary: 46 mg/dL — ABNORMAL LOW (ref 70–99)
Glucose-Capillary: 74 mg/dL (ref 70–99)
Glucose-Capillary: 190 mg/dL — ABNORMAL HIGH (ref 70–99)

## 2023-09-10 LAB — BASIC METABOLIC PANEL
Anion gap: 10 (ref 5–15)
BUN: 33 mg/dL — ABNORMAL HIGH (ref 8–23)
CO2: 20 mmol/L — ABNORMAL LOW (ref 22–32)
Calcium: 8.9 mg/dL (ref 8.9–10.3)
Chloride: 108 mmol/L (ref 98–111)
Creatinine, Ser: 2.22 mg/dL — ABNORMAL HIGH (ref 0.61–1.24)
GFR, Estimated: 28 mL/min — ABNORMAL LOW (ref >60)
Glucose, Bld: 136 mg/dL — ABNORMAL HIGH (ref 70–99)
Potassium: 4.3 mmol/L (ref 3.5–5.1)
Sodium: 138 mmol/L (ref 135–145)

## 2023-09-10 LAB — AMMONIA: Ammonia: 18 umol/L (ref 9–35)

## 2023-09-10 LAB — VITAMIN B12: Vitamin B-12: 1075 pg/mL — ABNORMAL HIGH (ref 180–914)

## 2023-09-10 MED ORDER — GLUCOSE 40 % PO GEL
ORAL | Status: AC
  Filled 2023-09-10: qty 1.21

## 2023-09-10 MED ORDER — DEXTROSE-SODIUM CHLORIDE 5-0.45 % IV SOLN: INTRAVENOUS | Status: AC

## 2023-09-10 MED ORDER — FOLIC ACID 1 MG PO TABS
1.0000 mg | ORAL_TABLET | Freq: Every day | ORAL | Status: DC
  Administered 2023-09-10 – 2023-09-14 (×5): 1 mg via ORAL
  Filled 2023-09-10 (×5): qty 1

## 2023-09-10 MED ORDER — QUETIAPINE 12.5 MG HALF TABLET
12.5000 mg | ORAL_TABLET | Freq: Every day | ORAL | Status: DC
  Administered 2023-09-10 – 2023-09-13 (×5): 12.5 mg via ORAL
  Filled 2023-09-10 (×5): qty 1

## 2023-09-10 MED ORDER — SODIUM CHLORIDE 0.45 % IV SOLN: INTRAVENOUS | Status: DC

## 2023-09-10 NOTE — Progress Notes (Signed)
Frequent up and downs for urination, provider made aware, pt given prn seroquel, re oriented

## 2023-09-10 NOTE — Progress Notes (Signed)
PROGRESS NOTE    Erik Moss  ZOX:[id number] DOB: 05-Aug-1933 DOA: 09/08/2023 PCP: Harvest Forest, MD   Brief Narrative:  89 y.o. male with medical history significant of CKD stage IIIb, HTN, HLD, DM-2, dementia, prior history of CVA, BPH and recent COVID-19 during first week of November presented with weakness, poor oral intake and worsening confusion.  On presentation, she was found to have hypernatremia, AKI and acute metabolic encephalopathy.  MRI of brain was negative for acute abnormalities; UA was negative for UTI; chest x-ray did not show any pneumonia.  She was started on IV fluids.  Assessment & Plan:   Acute metabolic encephalopathy superimposed on dementia -Possibly due to hypernatremia/AKI and dehydration. -MRI of brain was negative for acute abnormalities; UA was negative for UTI; chest x-ray did not show any pneumonia.  -Monitor mental status.  EEG negative for seizures.  Fall precautions.   -PT recommending SNF.  TOC consult. -Ammonia 15.  B12 not low.  Start empiric folate acid supplementation -Continue memantine and quetiapine -Use Haldol if needed.  Hypernatremia -Possibly from poor oral intake and dehydration.  Improved.  AKI on CKD stage IIIb -Creatinine improving.  Treated with IV fluids.  Encourage oral intake.  Acute metabolic acidosis -Mild.  Monitor.  Currently on oral sodium bicarbonate supplementation  Anemia of chronic disease -From chronic illnesses and renal failure.  Hemoglobin currently stable.  Monitor intermittently  Hypertension hyperlipidemia -Continue amlodipine and statin  Diabetes mellitus type 2 -Continue CBGs with SSI.  For modified diet  BPH -Continue tamsulosin  Goals of care -Overall prognosis is guarded to poor.  Consult palliative care for goals of care discussion.   DVT prophylaxis: Heparin subcutaneous Code Status: DNR Family Communication: None at bedside Disposition Plan: Status is: Inpatient Remains  inpatient appropriate because: Of severity of illness    Consultants: Consult palliative care  Procedures: EEG  Antimicrobials: None   Subjective: Patient seen and examined at bedside.  Nursing staff reports intermittent agitation overnight.  No fever, seizures, vomiting reported  Objective: Vitals:   09/09/23 0827 09/09/23 1716 09/09/23 1947 09/10/23 0414  BP: (!) 154/70 123/67 128/73 131/79  Pulse: 75 68 73 77  Resp: 16 17 18 18   Temp: 97.6 F (36.4 C) 98.2 F (36.8 C) 98.1 F (36.7 C) (!) 97.2 F (36.2 C)  TempSrc: Oral  Oral Oral  SpO2: 100% 100% 100% 100%  Weight:      Height:        Intake/Output Summary (Last 24 hours) at 09/10/2023 0737 Last data filed at 09/10/2023 0400 Gross per 24 hour  Intake 686.58 ml  Output 1400 ml  Net -713.42 ml   Filed Weights   09/08/23 2004 09/09/23 0400  Weight: 63.5 kg 60 kg    Examination:  General exam: Appears calm and comfortable.  Looks chronically ill and deconditioned.  On room air. Respiratory system: Bilateral decreased breath sounds at bases Cardiovascular system: S1 & S2 heard, Rate controlled Gastrointestinal system: Abdomen is nondistended, soft and nontender. Normal bowel sounds heard. Extremities: No cyanosis, clubbing, edema  Central nervous system: Wakes up slightly, confused.  No focal neurological deficits. Moving extremities Skin: No rashes, lesions or ulcers Psychiatry: Affect.  Not agitated.    Data Reviewed: I have personally reviewed following labs and imaging studies  CBC: Recent Labs  Lab 09/08/23 1452 09/09/23 0432 09/10/23 0622  WBC 7.6 8.3 8.6  NEUTROABS  --  5.0  --   HGB 11.1* 10.6* 10.3*  HCT 36.7* 34.1*  32.8*  MCV 100.8* 98.6 96.2  PLT 393 361 323   Basic Metabolic Panel: Recent Labs  Lab 09/08/23 1452 09-26-23 0432 09/10/23 0622  NA 150* 146* 138  K 4.7 4.0 4.3  CL 115* 112* 108  CO2 25 23 20*  GLUCOSE 216* 214* 136*  BUN 45* 40* 33*  CREATININE 2.77* 2.53* 2.22*   CALCIUM 9.2 8.9 8.9   GFR: Estimated Creatinine Clearance: 19.5 mL/min (A) (by C-G formula based on SCr of 2.22 mg/dL (H)). Liver Function Tests: No results for input(s): "AST", "ALT", "ALKPHOS", "BILITOT", "PROT", "ALBUMIN" in the last 168 hours. No results for input(s): "LIPASE", "AMYLASE" in the last 168 hours. Recent Labs  Lab 09/08/23 1958 09/10/23 0622  AMMONIA 15 18   Coagulation Profile: No results for input(s): "INR", "PROTIME" in the last 168 hours. Cardiac Enzymes: No results for input(s): "CKTOTAL", "CKMB", "CKMBINDEX", "TROPONINI" in the last 168 hours. BNP (last 3 results) No results for input(s): "PROBNP" in the last 8760 hours. HbA1C: Recent Labs    09/26/23 0432  HGBA1C 8.6*   CBG: Recent Labs  Lab 09/08/23 1613 Sep 26, 2023 0824 09/26/23 1151 September 26, 2023 1623 Sep 26, 2023 1948  GLUCAP 222* 150* 149* 165* 152*   Lipid Profile: No results for input(s): "CHOL", "HDL", "LDLCALC", "TRIG", "CHOLHDL", "LDLDIRECT" in the last 72 hours. Thyroid Function Tests: No results for input(s): "TSH", "T4TOTAL", "FREET4", "T3FREE", "THYROIDAB" in the last 72 hours. Anemia Panel: Recent Labs    09/10/23 0622  VITAMINB12 1,075*   Sepsis Labs: No results for input(s): "PROCALCITON", "LATICACIDVEN" in the last 168 hours.  No results found for this or any previous visit (from the past 240 hour(s)).       Radiology Studies: EEG adult  Result Date: 09-26-23 Charlsie Quest, MD     26-Sep-2023 12:49 PM Patient Name: Erik Moss MRN: [id number] Epilepsy Attending: Charlsie Quest Referring Physician/Provider: Maretta Bees, MD Date: 2023/09/26 Duration: 26.19 mins Patient history: 89yo M with ams getting eeg to evaluate for seizure. Level of alertness: Awake AEDs during EEG study: None Technical aspects: This EEG study was done with scalp electrodes positioned according to the 10-20 International system of electrode placement. Electrical activity was reviewed  with band pass filter of 1-70Hz , sensitivity of 7 uV/mm, display speed of 97mm/sec with a 60Hz  notched filter applied as appropriate. EEG data were recorded continuously and digitally stored.  Video monitoring was available and reviewed as appropriate. Description: The posterior dominant rhythm consists of 7 Hz activity of moderate voltage (25-35 uV) seen predominantly in posterior head regions, symmetric and reactive to eye opening and eye closing. EEG showed continuous generalized 5 to 6 Hz theta slowing. Hyperventilation and photic stimulation were not performed.   Of note, study was technically difficult due to significant myogenic artifact. ABNORMALITY - Continuous slow, generalized IMPRESSION: This technically difficult study is suggestive of mild to moderate diffuse encephalopathy. No seizures or epileptiform discharges were seen throughout the recording. Charlsie Quest   MR BRAIN WO CONTRAST  Result Date: 09/08/2023 CLINICAL DATA:  Initial evaluation for altered mental status, difficulty walking. EXAM: MRI HEAD WITHOUT CONTRAST TECHNIQUE: Multiplanar, multiecho pulse sequences of the brain and surrounding structures were obtained without intravenous contrast. COMPARISON:  CT from 07/21/2023 and MRI from 08/23/2021. FINDINGS: Brain: Diffuse prominence of the CSF containing spaces compatible generalized cerebral atrophy, moderate in nature. Patchy T2/FLAIR hyperintensity involving the periventricular and deep white matter both cerebral hemispheres as well as the pons, consistent with chronic small vessel ischemic disease,  mild-to-moderate in nature. No evidence for acute or subacute ischemia. Gray-white matter differentiation maintained. No acute intracranial hemorrhage. Small focus of chronic hemosiderin staining noted at the anterior right frontal lobe (series 7, image 67). No associated T1 or FLAIR signal intensity seen at this location to suggest acute blood products. No mass lesion, midline shift  or mass effect. No hydrocephalus or extra-axial fluid collection. Partially empty sella noted. Vascular: Major intracranial vascular flow voids are maintained. Skull and upper cervical spine: Craniocervical junction within normal limits. Degenerative thickening about the tectorial membrane noted. Moderate spondylosis within the visualized upper cervical spine. Bone marrow signal intensity within normal limits. No scalp soft tissue abnormality. Sinuses/Orbits: Prior bilateral ocular lens replacement. Scattered mucosal thickening present throughout the frontoethmoidal and maxillary sinuses. No significant mastoid effusion. Other: None. IMPRESSION: 1. No acute intracranial abnormality. 2. Moderate cerebral atrophy with mild-to-moderate chronic microvascular ischemic disease. Electronically Signed   By: Rise Mu M.D.   On: 09/08/2023 22:07   DG Chest 2 View  Result Date: 09/08/2023 CLINICAL DATA:  Altered level of consciousness, tired for 3 days EXAM: CHEST - 2 VIEW COMPARISON:  09/24/2020 FINDINGS: The heart size and mediastinal contours are within normal limits. Both lungs are clear. The visualized skeletal structures are unremarkable. IMPRESSION: No active cardiopulmonary disease. Electronically Signed   By: Sharlet Salina M.D.   On: 09/08/2023 20:25        Scheduled Meds:  amLODipine  5 mg Oral Daily   atorvastatin  40 mg Oral Daily   heparin  5,000 Units Subcutaneous Q8H   insulin aspart  0-15 Units Subcutaneous TID WC   insulin aspart  0-5 Units Subcutaneous QHS   memantine  10 mg Oral BID   mirabegron ER  50 mg Oral Daily   QUEtiapine  12.5 mg Oral QHS   sodium bicarbonate  650 mg Oral BID   tamsulosin  0.4 mg Oral QHS   Continuous Infusions:  sodium chloride 50 mL/hr at 09/09/23 1610          Sharonda Llamas Hanley Ben, MD [facility] 09/10/2023, 7:37 AM

## 2023-09-10 NOTE — Progress Notes (Signed)
Provider notified of pt frequent ups and downs and noncompliance of instruction, provider made aware that a sitter is the need at this time to assist with bathroom roundings and safety.. provider will place sitter orders, verbal orders to give dose of haldol. Dose given at this time

## 2023-09-10 NOTE — Evaluation (Signed)
Occupational Therapy Evaluation Patient Details Name: Lovett Benda Couser MRN: [id number] DOB: [birth year] Today's Date: 09/10/2023   History of Present Illness Pt is a 89 y.o. M who presents 09/08/2023 with weakness. Found to have hyponatremia and AKI. Significant PMH: CVA, HTN, CKD stage IIIb, HLD, DM-2, dementia, BPH.   Clinical Impression   Patient admitted for the diagnosis above.  Patient given sedating meds overnight due to confusion and restlessness.  Patient very lethargic, difficult to keep alert and needing Max A for basic transfers and ADL completion seated.  OT will continue efforts in the acute setting to address deficits, and Patient will benefit from continued inpatient follow up therapy, <3 hours/day        If plan is discharge home, recommend the following: Assist for transportation;Assistance with cooking/housework;A lot of help with walking and/or transfers;A lot of help with bathing/dressing/bathroom;Supervision due to cognitive status    Functional Status Assessment  Patient has had a recent decline in their functional status and demonstrates the ability to make significant improvements in function in a reasonable and predictable amount of time.  Equipment Recommendations  BSC/3in1    Recommendations for Other Services       Precautions / Restrictions Precautions Precautions: Fall Restrictions Weight Bearing Restrictions: No      Mobility Bed Mobility Overal bed mobility: Needs Assistance Bed Mobility: Supine to Sit     Supine to sit: Mod assist          Transfers Overall transfer level: Needs assistance   Transfers: Sit to/from Stand, Bed to chair/wheelchair/BSC Sit to Stand: Mod assist Stand pivot transfers: Max assist         General transfer comment: decreased ability to weight shift or take steps      Balance Overall balance assessment: Needs assistance Sitting-balance support: Feet supported Sitting balance-Leahy Scale: Fair      Standing balance support: Bilateral upper extremity supported Standing balance-Leahy Scale: Poor                             ADL either performed or assessed with clinical judgement   ADL       Grooming: Moderate assistance;Sitting   Upper Body Bathing: Maximal assistance;Sitting   Lower Body Bathing: Total assistance;Sitting/lateral leans   Upper Body Dressing : Moderate assistance;Sitting   Lower Body Dressing: Maximal assistance;Total assistance;Sitting/lateral leans                 General ADL Comments: Patient given PRN meds overnight due to confusion and trying to get OOB     Vision   Vision Assessment?: No apparent visual deficits     Perception Perception: Not tested       Praxis Praxis: Not tested       Pertinent Vitals/Pain Pain Assessment Pain Assessment: Faces Faces Pain Scale: No hurt Pain Intervention(s): Monitored during session     Extremity/Trunk Assessment Upper Extremity Assessment Upper Extremity Assessment: Generalized weakness   Lower Extremity Assessment Lower Extremity Assessment: Defer to PT evaluation   Cervical / Trunk Assessment Cervical / Trunk Assessment: Kyphotic   Communication Communication Communication: Difficulty communicating thoughts/reduced clarity of speech   Cognition Arousal: Lethargic, Suspect due to medications Behavior During Therapy: Flat affect Overall Cognitive Status: No family/caregiver present to determine baseline cognitive functioning  General Comments: Hx dementia; follows 1 step commands inconsistently, difficulty with sequencing     General Comments   VSS on RA    Exercises     Shoulder Instructions      Home Living Family/patient expects to be discharged to:: Skilled nursing facility                                        Prior Functioning/Environment Prior Level of Function : Patient poor historian/Family  not available             Mobility Comments: per chart review, patient independent with mobility ADLs Comments: per chart review, pt requiring increased assist with ADL's recently post Covid-19.        OT Problem List: Decreased activity tolerance;Impaired balance (sitting and/or standing);Decreased safety awareness      OT Treatment/Interventions: Self-care/ADL training;Therapeutic activities;DME and/or AE instruction;Cognitive remediation/compensation    OT Goals(Current goals can be found in the care plan section) Acute Rehab OT Goals OT Goal Formulation: Patient unable to participate in goal setting Time For Goal Achievement: 09/25/23 Potential to Achieve Goals: Fair  OT Frequency: Min 1X/week    Co-evaluation              AM-PAC OT "6 Clicks" Daily Activity     Outcome Measure Help from another person eating meals?: A Lot Help from another person taking care of personal grooming?: A Lot Help from another person toileting, which includes using toliet, bedpan, or urinal?: A Lot Help from another person bathing (including washing, rinsing, drying)?: A Lot Help from another person to put on and taking off regular upper body clothing?: A Lot Help from another person to put on and taking off regular lower body clothing?: A Lot 6 Click Score: 12   End of Session Equipment Utilized During Treatment: Gait belt Nurse Communication: Mobility status  Activity Tolerance: Patient limited by lethargy Patient left: in chair;with call bell/phone within reach;with chair alarm set;with nursing/sitter in room  OT Visit Diagnosis: Unsteadiness on feet (R26.81);Other symptoms and signs involving cognitive function                Time: 1140-1200 OT Time Calculation (min): 20 min Charges:  OT General Charges $OT Visit: 1 Visit OT Evaluation $OT Eval Moderate Complexity: 1 Mod  09/10/2023  RP, OTR/L  Acute Rehabilitation Services  Office:  [phone]   Suzanna Obey 09/10/2023, 12:05 PM

## 2023-09-10 NOTE — Consult Note (Signed)
Consultation Note Date: 09/10/2023   Patient Name: Erik Moss  DOB: 08-02-1933  MRN: [id number]  Age / Sex: 89 y.o., male  PCP: Harvest Forest, MD Referring Physician: Glade Lloyd, MD  Reason for Consultation: Establishing goals of care  HPI/Patient Profile: 89 y.o. male  with past medical history of  CKD stage IIIb, HTN, HLD, DM-2, dementia, prior history of CVA, BPH  admitted on 09/08/2023 with confusion, weakness, poor oral intake.   During the first week of November-patient was diagnosed with COVID-19 infection. He subsequently completely recovered-and was doing well-however for the past several days-he has been sleeping most the daytime. He now needs more assistance from family for daily activities of living. Prior to this acute event-patient was relatively independent-although he has dementia-he is high functioning when he is in a familiar environment. Currently admitted for AKI and hypernatremia, acute metabolic encephalopathy superimposed on dementia.  PMT has been consulted to assist with goals of care conversation.  Clinical Assessment and Goals of Care:  I have reviewed medical records including EPIC notes, labs and imaging, assessed the patient and then had a phone conversation with patient's daughter Erik Moss to discuss diagnosis prognosis, GOC, EOL wishes, disposition and options.  I introduced Palliative Medicine as specialized medical care for people living with serious illness. It focuses on providing relief from the symptoms and stress of a serious illness. The goal is to improve quality of life for both the patient and the family.  We discussed a brief life review of the patient and then focused on their current illness.  The natural disease trajectory and expectations at EOL were discussed.  I attempted to elicit values and goals of care important to the patient.    Medical History  Review and Understanding:  Provided daughter with update on patient's acute illness in the context of his chronic comorbidities, reviewing current care plan and recommendations.  Social History: Patient lives with his daughter. They are from [place] originally and she moved him to [facility] in 2020 when his facility was affected by COVID. She is his only child. Patient formerly worked as a Conservation officer, historic buildings on [employer] and holds more than 10 Master's degrees. He enjoys reading, keeping up with the news. He attends a memory care program M-F where he eats breakfast and lunch. His daughter provides Mom's meals for dinner.    Functional and Nutritional State: Patient was dressing/bathing/independent with use of walker until 4 days ago. For the past week, he has been much sleepier than normal. Daughter has not been sure about his water intake given his participation in his day program.   Palliative Symptoms: Lethargy, intermittent agitation  Discussion: Patient's daughter is receptive to palliative care support and appreciates clarification/reassurance that it is not the same as hospice. She seems a bit overwhelmed by PMT involvement and discussion of patient's high risk for further decline. We discussed the importance of preparing for all possible outcomes, best case and worst case, as well as the complications associated with delirium and hospitalizations when a patient has underlying dementia. Erik Moss has not discussed patient's wishes with him other than his clear preference for DNR/DNI. He normally responds that he is "feeling fine" when she inquires about his symptom burden and she asks if this PA thinks he will be able to get back to himself and his quality of life before the recent weeks. We discussed the uncertainty at this time and importance of ongoing GOC discussions based on his progress. She confirms that  she is interested in pursuing SNF placement. MOST form was introduced and encouraged. She  agrees that ongoing palliative support at SNF would be beneficial as well and will consider whether she wishes to fill out a MOST form. PMT contact information was provided.   Palliative Care services outpatient were explained and offered.   Discussed the importance of continued conversation with family and the medical providers regarding overall plan of care and treatment options, ensuring decisions are within the context of the patient's values and GOCs.   Questions and concerns were addressed. The family was encouraged to call with questions or concerns.  PMT will continue to support holistically.   SUMMARY OF RECOMMENDATIONS   -Continue DNR/DNI -Continue current care and allow more time for outcomes. Patient's daughter is processing his sudden decline, hopeful he may be able to return to his baseline QOL. Goal is SNF with outpatient palliative care f/u -[facility] consulted for outpatient palliative care referral. Assistance is appreciated -Psychosocial and emotional support provided -Ongoing GOC discussions pending clinical course -Recommended completion of MOST form prior to discharge -PMT will continue to follow and support  Prognosis:  Guarded, poor long-term prognosis given chronic comorbidities and functional/cognitive/nutritional decline  Discharge Planning: Skilled Nursing Facility for rehab with Palliative care service follow-up      Primary Diagnoses: Present on Admission:  Hypernatremia  Hyperlipidemia  BPH (benign prostatic hyperplasia)  Dementia without behavioral disturbance, psychotic disturbance, mood disturbance, or anxiety (HCC)   Physical Exam Vitals and nursing note reviewed.  Constitutional:      General: He is not in acute distress.    Appearance: He is ill-appearing.  Cardiovascular:     Rate and Rhythm: Normal rate.  Pulmonary:     Effort: Pulmonary effort is normal.  Skin:    General: Skin is warm and dry.  Neurological:     Mental Status: He is  disoriented.  Psychiatric:        Cognition and Memory: Cognition is impaired.    Vital Signs: BP (!) 106/58   Pulse 77   Temp 97.9 F (36.6 C) (Oral)   Resp 17   Ht 5\' 6"  (1.676 m)   Wt 60 kg   SpO2 98%   BMI 21.35 kg/m  Pain Scale: 0-10   Pain Score: 0-No pain   SpO2: SpO2: 98 % O2 Device:SpO2: 98 % O2 Flow Rate: .    Palliative Assessment/Data: TBD    MDM: high   Katie Faraone Jeni Salles, PA-C  Palliative Medicine Team Team phone # [phone]  Thank you for allowing the Palliative Medicine Team to assist in the care of this patient. Please utilize secure chat with additional questions, if there is no response within 30 minutes please call the above phone number.  Palliative Medicine Team providers are available by phone from 7am to 7pm daily and can be reached through the team cell phone.  Should this patient require assistance outside of these hours, please call the patient's attending physician.

## 2023-09-10 NOTE — Social Work (Cosign Needed)
Erik Moss [birth year]  To whom it may concern:  Please be advised that the above named has a primary diagnosis of dementia which supercedes any psychiatric diagnosis.

## 2023-09-10 NOTE — TOC Initial Note (Signed)
Transition of Care [facility]) - Initial/Assessment Note    Patient Details  Name: Erik Moss MRN: [id number] Date of Birth: 1933-04-29  Transition [facility]) CM/SW Contact:    Erik Picket, LCSW Phone Number: 09/10/2023, 12:23 PM  Clinical Narrative:                  CSW received SNF consult. CSW spoke with patients daughter Erik Moss on the phone. CSW introduced self and explained role at the hospital. Erik Moss reports that PTA he lives at home with her. She reports patient has been completing ADL's independently until about 4 days ago. She reports member is usually independent with a walker. Patient goes to a memory care day program during the week.    CSW reviewed PT/OT recommendations for SNF. Erik Moss reports she feels that pt will do well at rehab. Pt gave CSW permission to fax out to facilities in the area. Pt has no preference of facility at this time. CSW gave pt medicare.gov rating list to review. CSW explained insurance auth process.  CSW will continue to follow.   Expected Discharge Plan: Skilled Nursing Facility Barriers to Discharge: Continued Medical Work up   Patient Goals and CMS Choice Patient states their goals for this hospitalization and ongoing recovery are:: To go to rehab CMS Medicare.gov Compare Post Acute Care list provided to:: Patient Choice offered to / list presented to : Adult Children      Expected Discharge Plan and Services       Living arrangements for the past 2 months: Single Family Home                                      Prior Living Arrangements/Services Living arrangements for the past 2 months: Single Family Home Lives with:: Adult Children              Current home services: Other (comment), DME (memory care day program)    Activities of Daily Living      Permission Sought/Granted                  Emotional Assessment   Attitude/Demeanor/Rapport: Engaged Affect (typically observed): Calm Orientation:  : Oriented to Self, Oriented to Place Alcohol / Substance Use: Not Applicable Psych Involvement: No (comment)  Admission diagnosis:  Hypernatremia [E87.0] Patient Active Problem List   Diagnosis Date Noted   Hypernatremia 09/09/2023   Dementia without behavioral disturbance, psychotic disturbance, mood disturbance, or anxiety (HCC) 11/30/2021   Urinary incontinence 11/30/2021   Gait abnormality 11/30/2021   Dementia without behavioral disturbance (HCC) 02/23/2021   Cerebrovascular accident (CVA) (HCC) 02/23/2021   Hyperglycemia due to diabetes mellitus (HCC) 09/24/2020   Acute CVA (cerebrovascular accident) (HCC) 10/08/2019   Type 2 diabetes mellitus (HCC) 10/08/2019   Hyperlipidemia 10/08/2019   BPH (benign prostatic hyperplasia) 10/08/2019   Hypertension 10/08/2019   ARF (acute renal failure) (HCC) 05/08/2019   PCP:  Erik Forest, MD Pharmacy:   [facility] [address] [place] - [address] [place] [address] [place] [address] Phone: [phone] Fax: [phone]     Social Determinants of Health (SDOH) Social History: SDOH Screenings   Depression (PHQ2-9): Low Risk  (11/19/2019)  Tobacco Use: Low Risk  (08/23/2023)   Received from [facility]   SDOH Interventions:     Readmission Risk Interventions     No data to display

## 2023-09-10 NOTE — NC FL2 (Signed)
[place] MEDICAID FL2 LEVEL OF CARE FORM     IDENTIFICATION  Patient Name: Erik Moss Birthdate: 18-Nov-1932 Sex: male Admission Date (Current Location): 09/08/2023  [facility] and [place] Number:  Producer, television/film/video and Address:  [facility], [address], [place], [place] [address]      Provider Number: [id number]  Attending Physician Name and Address:  Glade Lloyd, MD  Relative Name and Phone Number:       Current Level of Care: Hospital Recommended Level of Care: Skilled Nursing Facility Prior Approval Number:    Date Approved/Denied:   PASRR Number: pending  Discharge Plan: SNF    Current Diagnoses: Patient Active Problem List   Diagnosis Date Noted   Hypernatremia 09/09/2023   Dementia without behavioral disturbance, psychotic disturbance, mood disturbance, or anxiety (HCC) 11/30/2021   Urinary incontinence 11/30/2021   Gait abnormality 11/30/2021   Dementia without behavioral disturbance (HCC) 02/23/2021   Cerebrovascular accident (CVA) (HCC) 02/23/2021   Hyperglycemia due to diabetes mellitus (HCC) 09/24/2020   Acute CVA (cerebrovascular accident) (HCC) 10/08/2019   Type 2 diabetes mellitus (HCC) 10/08/2019   Hyperlipidemia 10/08/2019   BPH (benign prostatic hyperplasia) 10/08/2019   Hypertension 10/08/2019   ARF (acute renal failure) (HCC) 05/08/2019    Orientation RESPIRATION BLADDER Height & Weight     Self, Place  Normal Incontinent Weight: 132 lb 4.4 oz (60 kg) Height:  5\' 6"  (167.6 cm)  BEHAVIORAL SYMPTOMS/MOOD NEUROLOGICAL BOWEL NUTRITION STATUS      Continent Diet (See DC summary)  AMBULATORY STATUS COMMUNICATION OF NEEDS Skin   Extensive Assist   Normal                       Personal Care Assistance Level of Assistance  Bathing, Feeding, Dressing Bathing Assistance: Maximum assistance Feeding assistance: Independent Dressing Assistance: Maximum assistance     Functional Limitations Info   Sight, Hearing, Speech Sight Info: Adequate Hearing Info: Adequate Speech Info: Adequate    SPECIAL CARE FACTORS FREQUENCY  PT (By licensed PT), OT (By licensed OT)     PT Frequency: 5x a week OT Frequency: 5x a week            Contractures Contractures Info: Not present    Additional Factors Info  Code Status, Allergies Code Status Info: DNR limited Allergies Info: NKA           Current Medications (09/10/2023):  This is the current hospital active medication list Current Facility-Administered Medications  Medication Dose Route Frequency Provider Last Rate Last Admin   0.45 % sodium chloride infusion   Intravenous Continuous Hanley Ben, Kshitiz, MD 50 mL/hr at 09/10/23 1013 New Bag at 09/10/23 1013   acetaminophen (TYLENOL) tablet 650 mg  650 mg Oral Q6H PRN Maretta Bees, MD       Or   acetaminophen (TYLENOL) suppository 650 mg  650 mg Rectal Q6H PRN Ghimire, Werner Lean, MD       albuterol (PROVENTIL) (2.5 MG/3ML) 0.083% nebulizer solution 2.5 mg  2.5 mg Nebulization Q2H PRN Ghimire, Werner Lean, MD       amLODipine (NORVASC) tablet 5 mg  5 mg Oral Daily Maretta Bees, MD   5 mg at 09/09/23 0556   atorvastatin (LIPITOR) tablet 40 mg  40 mg Oral Daily Maretta Bees, MD   40 mg at 09/10/23 1034   bisacodyl (DULCOLAX) EC tablet 5 mg  5 mg Oral Daily PRN Maretta Bees,  MD       folic acid (FOLVITE) tablet 1 mg  1 mg Oral Daily Alekh, Kshitiz, MD   1 mg at 09/10/23 1033   haloperidol lactate (HALDOL) injection 2 mg  2 mg Intravenous Q6H PRN Maretta Bees, MD   2 mg at 09/10/23 0614   heparin injection 5,000 Units  5,000 Units Subcutaneous Q8H Maretta Bees, MD   5,000 Units at 09/10/23 1610   insulin aspart (novoLOG) injection 0-15 Units  0-15 Units Subcutaneous TID WC Maretta Bees, MD   2 Units at 09/10/23 1249   insulin aspart (novoLOG) injection 0-5 Units  0-5 Units Subcutaneous QHS Maretta Bees, MD       memantine [facility]) tablet 10 mg   10 mg Oral BID Maretta Bees, MD   10 mg at 09/10/23 1033   mirabegron ER (MYRBETRIQ) tablet 50 mg  50 mg Oral Daily Maretta Bees, MD   50 mg at 09/10/23 1033   ondansetron (ZOFRAN) tablet 4 mg  4 mg Oral Q6H PRN Maretta Bees, MD       Or   ondansetron [facility]) injection 4 mg  4 mg Intravenous Q6H PRN Ghimire, Werner Lean, MD       QUEtiapine (SEROQUEL) tablet 12.5 mg  12.5 mg Oral QHS Anthoney Harada, NP   12.5 mg at 09/10/23 9604   sodium bicarbonate tablet 650 mg  650 mg Oral BID Maretta Bees, MD   650 mg at 09/10/23 1034   tamsulosin (FLOMAX) capsule 0.4 mg  0.4 mg Oral QHS Maretta Bees, MD   0.4 mg at 09/09/23 2117     Discharge Medications: Please see discharge summary for a list of discharge medications.  Relevant Imaging Results:  Relevant Lab Results:   Additional Information SSN [id number]  Jimmy Picket, [place]

## 2023-09-10 NOTE — Progress Notes (Signed)
Pt continually getting out of bed, insisting on going to grocery store and asking what is the plan, frequent re orientations to place and time, prn dose given to assist with agitation and anxiety

## 2023-09-10 NOTE — Progress Notes (Signed)
Hypoglycemic Event  CBG: 46  Treatment: OJ 8 oz given po  Symptoms: none noted  Follow-up CBG: Time: 1630 CBG Result:  74  Possible Reasons for Event: Inadequate meal intake, was sleepy from the Haldol given earlier, did not eat lunch.  Comments/MD notified:Dr. Sheryle Spray, Nelson Chimes

## 2023-09-10 NOTE — Plan of Care (Signed)

## 2023-09-10 NOTE — Progress Notes (Signed)
Mobility Specialist: Progress Note   09/10/23 1616  Mobility  Activity Transferred from chair to bed  Level of Assistance Moderate assist, patient does 50-74%  Assistive Device Front wheel walker  Distance Ambulated (ft) 3 ft  Activity Response Tolerated fair  Mobility Referral Yes  $Mobility charge 1 Mobility  Mobility Specialist Start Time (ACUTE ONLY) 1336  Mobility Specialist Stop Time (ACUTE ONLY) 1344  Mobility Specialist Time Calculation (min) (ACUTE ONLY) 8 min    Pt was agreeable to mobility session - received in bed. ModA for STS, heavy modA throughout transfer. Required verbal and tactile cues for RW proximity. Left in bed with all needs met, call bell in reach. Bed alarm on.   Maurene Capes Mobility Specialist Please contact via SecureChat or Rehab office at [phone]

## 2023-09-10 NOTE — Progress Notes (Signed)
Pt being assisted to eat meal.

## 2023-09-10 NOTE — Plan of Care (Signed)
  Problem: Activity: Goal: Risk for activity intolerance will decrease Outcome: Progressing   Problem: Nutrition: Goal: Adequate nutrition will be maintained Outcome: Progressing   Problem: Coping: Goal: Level of anxiety will decrease Outcome: Progressing   Problem: Pain Management: Goal: General experience of comfort will improve Outcome: Progressing   Problem: Safety: Goal: Ability to remain free from injury will improve Outcome: Progressing

## 2023-09-10 NOTE — Evaluation (Signed)
Clinical/Bedside Swallow Evaluation Patient Details  Name: Erik Moss MRN: [id number] Date of Birth: [birth year]  Today's Date: 09/10/2023 Time: SLP Start Time (ACUTE ONLY): 1115 SLP Stop Time (ACUTE ONLY): 1120 SLP Time Calculation (min) (ACUTE ONLY): 5 min  Past Medical History:  Past Medical History:  Diagnosis Date   BPH (benign prostatic hyperplasia) 10/08/2019   Diabetes mellitus without complication (HCC)    Hyperlipidemia 10/08/2019   Hypertension 10/08/2019   Renal disease    Stroke [name])    TIA (transient ischemic attack)    Type 2 diabetes mellitus (HCC) 10/08/2019   Past Surgical History:  Past Surgical History:  Procedure Laterality Date   BUBBLE STUDY  10/10/2019   Procedure: BUBBLE STUDY;  Surgeon: Sande Rives, MD;  Location: [facility] ENDOSCOPY;  Service: Cardiology;;   TEE WITHOUT CARDIOVERSION N/A 10/10/2019   Procedure: TRANSESOPHAGEAL ECHOCARDIOGRAM (TEE);  Surgeon: Sande Rives, MD;  Location: [facility] ENDOSCOPY;  Service: Cardiology;  Laterality: N/A;   HPI:  Erik Moss is an 89 y.o. male who presented with weakness, poor oral intake and worsening confusion.  He was found to have hypernatremia, AKI and acute metabolic encephalopathy.  MRI 11/15 with no acute findings.  CXR 11/15 with clear lungs. Pt with medical history significant of CKD stage IIIb, HTN, HLD, DM-2, dementia, prior history of CVA, BPH and recent COVID-19 during first week of November.    Assessment / Plan / Recommendation  Clinical Impression  Pt presents with functional swallowing as assessed clinically.  Pt tolerated all consistencies trialed with no clinical s/s of aspiration.  He exhibited adequate clearance of regular solids.  Unable to complete OME.  Pt's CXR is unconcerning for pna.  Decreased PO intake may be a part of natural progression of dementia.  Would not recommend PEG tube for pt as in patients with advanced dementia AMN does not improve quality or increase  quantity of life. Would not recommend modifed liquid consistencies given presence of hypernatremia on admission. Fortunately, pt is consuming thin liquid without difficulty at this time. Pt consumed 8 oz of water by serial straw sips without any clinical s/s of aspiration.   Pt has no further ST needs.  SLP will sign off.    Recommend continuing regular diet with thin liquids.   SLP Visit Diagnosis: Dysphagia, unspecified (R13.10)    Aspiration Risk  No limitations    Diet Recommendation Regular;Thin liquid    Liquid Administration via: Straw;Cup Medication Administration: Crushed with puree (as needed) Supervision: Full supervision/cueing for compensatory strategies;Staff to assist with self feeding Compensations: Slow rate;Small sips/bites Postural Changes: Seated upright at 90 degrees    Other  Recommendations Oral Care Recommendations: Oral care BID    Recommendations for follow up therapy are one component of a multi-disciplinary discharge planning process, led by the attending physician.  Recommendations may be updated based on patient status, additional functional criteria and insurance authorization.  Follow up Recommendations No SLP follow up      Assistance Recommended at Discharge    Functional Status Assessment Patient has not had a recent decline in their functional status  Frequency and Duration  (N/A)          Prognosis Prognosis for improved oropharyngeal function:  (N/A)      Swallow Study   General Date of Onset: 09/08/23 HPI: Erik Moss is an 89 y.o. male who presented with weakness, poor oral intake and worsening confusion.  He was found to have hypernatremia, AKI and acute metabolic encephalopathy.  MRI 11/15 with no acute findings.  CXR 11/15 with clear lungs. Pt with medical history significant of CKD stage IIIb, HTN, HLD, DM-2, dementia, prior history of CVA, BPH and recent COVID-19 during first week of November. Type of Study: Bedside Swallow  Evaluation Previous Swallow Assessment: None Diet Prior to this Study: Regular Respiratory Status: Room air History of Recent Intubation: No Behavior/Cognition: Alert;Cooperative;Pleasant mood Oral Cavity Assessment: Within Functional Limits Oral Care Completed by SLP: No Oral Cavity - Dentition:  (Unable to assess, but seemingly adequate) Self-Feeding Abilities: Needs assist Patient Positioning: Upright in bed Baseline Vocal Quality: Not observed Volitional Cough: Cognitively unable to elicit Volitional Swallow: Unable to elicit    Oral/Motor/Sensory Function Overall Oral Motor/Sensory Function:  (Unable to assess)   Ice Chips Ice chips: Not tested   Thin Liquid Thin Liquid: Within functional limits Presentation: Straw    Nectar Thick Nectar Thick Liquid: Not tested   Honey Thick Honey Thick Liquid: Not tested   Puree Puree: Within functional limits Presentation: Spoon   Solid     Solid: Within functional limits Presentation:  (SLP fed)      Kerrie Pleasure, MA, CCC-SLP Acute Rehabilitation Services Office: [phone] 09/10/2023,11:34 AM

## 2023-09-11 DIAGNOSIS — Z515 Encounter for palliative care: Secondary | ICD-10-CM | POA: Diagnosis not present

## 2023-09-11 DIAGNOSIS — R531 Weakness: Secondary | ICD-10-CM | POA: Diagnosis not present

## 2023-09-11 DIAGNOSIS — Z789 Other specified health status: Secondary | ICD-10-CM

## 2023-09-11 DIAGNOSIS — Z66 Do not resuscitate: Secondary | ICD-10-CM

## 2023-09-11 DIAGNOSIS — Z7189 Other specified counseling: Secondary | ICD-10-CM | POA: Diagnosis not present

## 2023-09-11 DIAGNOSIS — Z711 Person with feared health complaint in whom no diagnosis is made: Secondary | ICD-10-CM

## 2023-09-11 DIAGNOSIS — E870 Hyperosmolality and hypernatremia: Secondary | ICD-10-CM | POA: Diagnosis not present

## 2023-09-11 LAB — GLUCOSE, CAPILLARY
Glucose-Capillary: 126 mg/dL — ABNORMAL HIGH (ref 70–99)
Glucose-Capillary: 186 mg/dL — ABNORMAL HIGH (ref 70–99)
Glucose-Capillary: 135 mg/dL — ABNORMAL HIGH (ref 70–99)
Glucose-Capillary: 171 mg/dL — ABNORMAL HIGH (ref 70–99)

## 2023-09-11 LAB — COMPREHENSIVE METABOLIC PANEL
ALT: 14 U/L (ref 0–44)
AST: 15 U/L (ref 15–41)
Albumin: 3.2 g/dL — ABNORMAL LOW (ref 3.5–5.0)
Alkaline Phosphatase: 81 U/L (ref 38–126)
Anion gap: 6 (ref 5–15)
BUN: 29 mg/dL — ABNORMAL HIGH (ref 8–23)
CO2: 23 mmol/L (ref 22–32)
Calcium: 8.8 mg/dL — ABNORMAL LOW (ref 8.9–10.3)
Chloride: 108 mmol/L (ref 98–111)
Creatinine, Ser: 2.28 mg/dL — ABNORMAL HIGH (ref 0.61–1.24)
GFR, Estimated: 27 mL/min — ABNORMAL LOW (ref >60)
Glucose, Bld: 142 mg/dL — ABNORMAL HIGH (ref 70–99)
Potassium: 4.2 mmol/L (ref 3.5–5.1)
Sodium: 137 mmol/L (ref 135–145)
Total Bilirubin: 0.8 mg/dL (ref <1.2)
Total Protein: 6 g/dL — ABNORMAL LOW (ref 6.5–8.1)

## 2023-09-11 LAB — CBC WITH DIFFERENTIAL/PLATELET
Abs Immature Granulocytes: 0.02 10*3/uL (ref 0.00–0.07)
Basophils Absolute: 0 10*3/uL (ref 0.0–0.1)
Basophils Relative: 0 %
Eosinophils Absolute: 0.2 10*3/uL (ref 0.0–0.5)
Eosinophils Relative: 2 %
HCT: 35.4 % — ABNORMAL LOW (ref 39.0–52.0)
Hemoglobin: 11.2 g/dL — ABNORMAL LOW (ref 13.0–17.0)
Immature Granulocytes: 0 %
Lymphocytes Relative: 31 %
Lymphs Abs: 2.7 10*3/uL (ref 0.7–4.0)
MCH: 30 pg (ref 26.0–34.0)
MCHC: 31.6 g/dL (ref 30.0–36.0)
MCV: 94.9 fL (ref 80.0–100.0)
Monocytes Absolute: 0.6 10*3/uL (ref 0.1–1.0)
Monocytes Relative: 6 %
Neutro Abs: 5.1 10*3/uL (ref 1.7–7.7)
Neutrophils Relative %: 61 %
Platelets: 349 10*3/uL (ref 150–400)
RBC: 3.73 MIL/uL — ABNORMAL LOW (ref 4.22–5.81)
RDW: 13.8 % (ref 11.5–15.5)
WBC: 8.5 10*3/uL (ref 4.0–10.5)
nRBC: 0 % (ref 0.0–0.2)

## 2023-09-11 LAB — MAGNESIUM: Magnesium: 2.1 mg/dL (ref 1.7–2.4)

## 2023-09-11 NOTE — Progress Notes (Signed)
Physical Therapy Treatment Patient Details Name: Erik Moss MRN: [id number] DOB: 08/11/1933 Today's Date: 09/11/2023   History of Present Illness Pt is a 89 y.o. M who presents 09/08/2023 with weakness. Found to have hyponatremia and AKI. Significant PMH: CVA, HTN, CKD stage IIIb, HLD, DM-2, dementia, BPH.    PT Comments  Pt received in supine, agreeable to therapy session, alert and oriented to location, self, and year, not fully to situation. Pt with good participation in transfer training, but tends to be tangential and increased time needed to initiate and perform tasks with notable difficulty with motor sequencing needing multimodal cues for most tasks. Pt needing up to modA (+2 safety) for gait with chair follow and pt tending to buckle as he fatigues. Pt without c/o dizziness while standing but plan to check orthostatic BP next session given BLE fatigue after short period of time standing. Pt agreeable to sit up in recliner at end of session, RN aware and chair alarm on for pt safety. Pt continues to benefit from PT services to progress toward functional mobility goals.     If plan is discharge home, recommend the following: A little help with bathing/dressing/bathroom;A lot of help with walking and/or transfers;Assistance with cooking/housework;Direct supervision/assist for medications management;Direct supervision/assist for financial management;Assist for transportation;Help with stairs or ramp for entrance   Can travel by private vehicle     Yes  Equipment Recommendations  None recommended by PT    Recommendations for Other Services       Precautions / Restrictions Precautions Precautions: Fall Restrictions Weight Bearing Restrictions: No     Mobility  Bed Mobility Overal bed mobility: Needs Assistance Bed Mobility: Supine to Sit     Supine to sit: Mod assist, +2 for physical assistance, Used rails     General bed mobility comments: Pt difficult to redirect  and pulling up on both side rails when cued to reach cross-body for rail to sit up on R side of bed, pt needs hand over hand assist to remove grip from L bed rail and to place on R bed rail both hands, and BLE to initiate scooting to EOB. Increased assist also due to no bed pad under his hips.    Transfers Overall transfer level: Needs assistance Equipment used: Rolling walker (2 wheels), None Transfers: Sit to/from Stand Sit to Stand: Min assist   Step pivot transfers: Mod assist, +2 safety/equipment       General transfer comment: from EOB>RW x1 and chair<>RW x1 and chair>+2 HHA x5 reciprocal reps    Ambulation/Gait Ambulation/Gait assistance: Mod assist, +2 safety/equipment Gait Distance (Feet): 15 Feet Assistive device: Rolling walker (2 wheels) Gait Pattern/deviations: Step-through pattern, Decreased stride length, Trunk flexed, Knee flexed in stance - right, Knee flexed in stance - left, Shuffle Gait velocity: decreased     General Gait Details: Difficulty controlling forward momentum and negotiating around obstacles with poor proximity to walker. Consistent modA for RW management, postural cues and stabilizing with gait belt; Pt tending to have increased bil knee/trunk flexion as he fatigues, chair pulled up behind him to sit. Pt had been using bari RW as this was what was in his room, RW switched for regular width after session.   Stairs             Wheelchair Mobility     Tilt Bed    Modified Rankin (Stroke Patients Only)       Balance Overall balance assessment: Needs assistance Sitting-balance support: Feet supported Sitting balance-Leahy  Scale: Fair     Standing balance support: Bilateral upper extremity supported Standing balance-Leahy Scale: Poor Standing balance comment: +2 safety-gait, slowly buckling with fatigue                            Cognition Arousal: Alert Behavior During Therapy: WFL for tasks assessed/performed Overall  Cognitive Status: History of cognitive impairments - at baseline                                 General Comments: Hx dementia; Tangential, however, pleasant. He follows 1 step commands inconsistently, difficulty with sequencing mobility tasks, carries on tangential conversation fairly well but needs frequent redirection to task.        Exercises Other Exercises Other Exercises: STS x 5 reps reciprocal using hands to push up    General Comments General comments (skin integrity, edema, etc.): BP 116/73 in recliner after session, HR 70's bpm seated      Pertinent Vitals/Pain Pain Assessment Pain Assessment: PAINAD Breathing: normal Negative Vocalization: none Facial Expression: smiling or inexpressive Body Language: relaxed Consolability: no need to console PAINAD Score: 0 Pain Intervention(s): Monitored during session, Repositioned    Home Living                          Prior Function            PT Goals (current goals can now be found in the care plan section) Acute Rehab PT Goals Patient Stated Goal: did not state PT Goal Formulation: With patient Time For Goal Achievement: 09/23/23 Progress towards PT goals: Progressing toward goals    Frequency    Min 1X/week      PT Plan      Co-evaluation              AM-PAC PT "6 Clicks" Mobility   Outcome Measure  Help needed turning from your back to your side while in a flat bed without using bedrails?: A Little Help needed moving from lying on your back to sitting on the side of a flat bed without using bedrails?: A Lot (flat bed/no rails) Help needed moving to and from a bed to a chair (including a wheelchair)?: A Lot Help needed standing up from a chair using your arms (e.g., wheelchair or bedside chair)?: A Little Help needed to walk in hospital room?: Total Help needed climbing 3-5 steps with a railing? : Total 6 Click Score: 12    End of Session Equipment Utilized During  Treatment: Gait belt Activity Tolerance: Patient tolerated treatment well;Other (comment);Patient limited by fatigue (BLE buckling with fatigue) Patient left: in chair;with call bell/phone within reach;with chair alarm set Nurse Communication: Mobility status PT Visit Diagnosis: Unsteadiness on feet (R26.81)     Time: 0347-4259 PT Time Calculation (min) (ACUTE ONLY): 22 min  Charges:    $Therapeutic Activity: 8-22 mins PT General Charges $$ ACUTE PT VISIT: 1 Visit                     Jomayra Novitsky P., PTA Acute Rehabilitation Services Secure Chat Preferred 9a-5:30pm Office: [phone]    Dorathy Kinsman [facility] 09/11/2023, 4:22 PM

## 2023-09-11 NOTE — TOC Progression Note (Addendum)
Transition of Care [facility] - Progression Note    Patient Details  Name: Erik Moss MRN: [id number] Date of Birth: Apr 16, 1933  Transition of Care [facility]) CM/SW Contact  Carley Hammed, LCSW Phone Number: 09/11/2023, 10:03 AM  Clinical Narrative:    CSW uploaded PASRR docs to [facility], will await approval. Additional in network referrals sent out for review. Will provide bed offers as available. TOC will continue to follow.   12:00 CSW met with pt's dtr at bedside and provided medicare list and bed offers. Barriers were discussed including dementia and needed use for Halidol administration overnight. Dtr noting a preference for [facility] or [facility]. [facility] considering, no response from the others. PASRR continues to pend. TOC will continue to follow.  Expected Discharge Plan: Skilled Nursing Facility Barriers to Discharge: Continued Medical Work up  Expected Discharge Plan and Services       Living arrangements for the past 2 months: Single Family Home                                       Social Determinants of Health (SDOH) Interventions SDOH Screenings   Depression (PHQ2-9): Low Risk  (11/19/2019)  Tobacco Use: Low Risk  (08/23/2023)   Received from [facility]    Readmission Risk Interventions     No data to display

## 2023-09-11 NOTE — Progress Notes (Signed)
Daily Progress Note   Patient Name: Erik Moss       Date: 09/11/2023 DOB: 01-22-1933  Age: 89 y.o. MRN#: [id number] Attending Physician: Glade Lloyd, MD Primary Care Physician: Harvest Forest, MD Admit Date: 09/08/2023  Reason for Consultation/Follow-up: Establishing goals of care  Subjective: I have reviewed medical records including EPIC notes, MAR, and labs. Received report from primary RN - no acute concerns.   Went to visit patient at bedside - no family/visitors present. Patient was lying in bed awake, alert, oriented to self and year (not month, place, or situation). Patient was able to participate in simple conversation; he is not able to make complex medical decisions. No signs or non-verbal gestures of pain or discomfort noted. No respiratory distress, increased work of breathing, or secretions noted. Patient is calm and watching tv.   1:31 PM Called patient's daughter/Paula - emotional support provided. She expressed concern regarding the haldol he received last night - stating this morning he was "out of it" with slurred speech. Provided updates per my assessment this afternoon - she was happy to hear he was more awake with improved speech. Medication education provided regarding haldol and Seroquel. She understand the use of haldol is a barrier to his discharge. [name] confirms goals remain for his discharge to rehab with outpatient Palliative Care to follow.  Introduced and reviewed MOST form - will leave form as well as Hard Choices book at patient's bedside for Paula's review when she returns tonight. If desires, she will complete form tonight and leave at patient's bedside for PMT to finalize tomorrow. She teaches on [employer] and is unable to visit or accept phone calls on  these days. If the form is not at bedside, will not complete form during hospitalization. Education provided that MOST form can be completed with outpatient Palliative Care after his discharge.   DNR/DNI confirmed.  All questions and concerns addressed. Encouraged to call with questions and/or concerns. PMT card provided.  Patient was feeding himself lunch when MOST form and Hard Choices book was left at bedside.  Length of Stay: 2  Current Medications: Scheduled Meds:   amLODipine  5 mg Oral Daily   atorvastatin  40 mg Oral Daily   folic acid  1 mg Oral Daily   heparin  5,000 Units Subcutaneous Q8H  insulin aspart  0-15 Units Subcutaneous TID WC   insulin aspart  0-5 Units Subcutaneous QHS   memantine  10 mg Oral BID   mirabegron ER  50 mg Oral Daily   QUEtiapine  12.5 mg Oral QHS   sodium bicarbonate  650 mg Oral BID   tamsulosin  0.4 mg Oral QHS    Continuous Infusions:  dextrose 5 % and 0.45 % NaCl 50 mL/hr at 09/10/23 1756    PRN Meds: acetaminophen **OR** acetaminophen, albuterol, bisacodyl, haloperidol lactate, ondansetron **OR** ondansetron (ZOFRAN) IV  Physical Exam Vitals and nursing note reviewed.  Constitutional:      General: He is not in acute distress.    Appearance: He is ill-appearing.  Pulmonary:     Effort: No respiratory distress.  Skin:    General: Skin is warm and dry.  Neurological:     Mental Status: He is alert. Mental status is at baseline. He is disoriented and confused.     Motor: Weakness present.  Psychiatric:        Attention and Perception: Attention normal.        Behavior: Behavior is cooperative.        Cognition and Memory: Cognition is impaired. Memory is impaired.             Vital Signs: BP (!) 157/74   Pulse 74   Temp 97.9 F (36.6 C) (Oral)   Resp 16   Ht 5\' 6"  (1.676 m)   Wt 61 kg   SpO2 97%   BMI 21.71 kg/m  SpO2: SpO2: 97 % O2 Device: O2 Device: Room Air O2 Flow Rate:    Intake/output summary:   Intake/Output Summary (Last 24 hours) at 09/11/2023 1343 Last data filed at 09/11/2023 0910 Gross per 24 hour  Intake 1547.56 ml  Output --  Net 1547.56 ml   LBM: Last BM Date : 09/10/23 Baseline Weight: Weight: 63.5 kg Most recent weight: Weight: 61 kg       Palliative Assessment/Data: PPS 40%      Patient Active Problem List   Diagnosis Date Noted   Hypernatremia 09/09/2023   Dementia without behavioral disturbance, psychotic disturbance, mood disturbance, or anxiety (HCC) 11/30/2021   Urinary incontinence 11/30/2021   Gait abnormality 11/30/2021   Dementia without behavioral disturbance (HCC) 02/23/2021   Cerebrovascular accident (CVA) (HCC) 02/23/2021   Hyperglycemia due to diabetes mellitus (HCC) 09/24/2020   Acute CVA (cerebrovascular accident) (HCC) 10/08/2019   Type 2 diabetes mellitus (HCC) 10/08/2019   Hyperlipidemia 10/08/2019   BPH (benign prostatic hyperplasia) 10/08/2019   Hypertension 10/08/2019   ARF (acute renal failure) (HCC) 05/08/2019    Palliative Care Assessment & Plan   Patient Profile: 89 y.o. male  with past medical history of  CKD stage IIIb, HTN, HLD, DM-2, dementia, prior history of CVA, BPH  admitted on 09/08/2023 with confusion, weakness, poor oral intake.    During the first week of November-patient was diagnosed with COVID-19 infection. He subsequently completely recovered-and was doing well-however for the past several days-he has been sleeping most the daytime. He now needs more assistance from family for daily activities of living. Prior to this acute event-patient was relatively independent-although he has dementia-he is high functioning when he is in a familiar environment. Currently admitted for AKI and hypernatremia, acute metabolic encephalopathy superimposed on dementia.  Assessment: Principal Problem:   Hypernatremia Active Problems:   Hyperlipidemia   BPH (benign prostatic hyperplasia)   Dementia without behavioral  disturbance, psychotic disturbance, mood  disturbance, or anxiety (HCC)   Recommendations/Plan: Continue to treat the treatable Continue DNR/DNI as previously documented - durable DNR form completed and placed in shadow chart. Copy was made and will be scanned into Vynca/ACP tab Goals are for patient's discharge to rehab with outpatient Palliative Care to follow - [facility] consult previously placed MOST form and Hard Choices book left at bedside for daughter's review PMT will continue to follow and support holistically  Goals of Care and Additional Recommendations: Limitations on Scope of Treatment: Full Scope Treatment  Code Status:    Code Status Orders  (From admission, onward)           Start     Ordered   09/09/23 0736  Do not attempt resuscitation (DNR)- Limited -Do Not Intubate (DNI)  Continuous       Question Answer Comment  If pulseless and not breathing No CPR or chest compressions.   In Pre-Arrest Conditions (Patient Is Breathing and Has A Pulse) Do not intubate. Provide all appropriate non-invasive medical interventions. Avoid ICU transfer unless indicated or required.   Consent: Discussion documented in EHR or advanced directives reviewed      09/09/23 0736           Code Status History     Date Active Date Inactive Code Status Order ID Comments User Context   10/08/2019 1613 10/10/2019 1806 Full Code [id number]  Bobette Mo, MD ED   05/08/2019 1815 05/10/2019 1830 Full Code [id number]  Kathlen Mody, MD ED       Prognosis:  Guarded to poor long-term prognosis given chronic comorbidities and functional/cognitive/nutritional decline   Discharge Planning: Skilled Nursing Facility for rehab with Palliative care service follow-up  Care plan was discussed with primary RN, patient, patient's daughter   Thank you for allowing the Palliative Medicine Team to assist in the care of this patient.   Total Time 50 minutes Prolonged Time Billed  no       Haskel Khan, NP  Please contact Palliative Medicine Team phone at [phone] for questions and concerns.   *Portions of this note are a verbal dictation therefore any spelling and/or grammatical errors are due to the "[employer]agon [employer] One" system interpretation.

## 2023-09-11 NOTE — Progress Notes (Signed)
PROGRESS NOTE    Erik Moss  WUJ:[id number] DOB: April 01, 1933 DOA: 09/08/2023 PCP: Harvest Forest, MD   Brief Narrative:  89 y.o. male with medical history significant of CKD stage IIIb, HTN, HLD, DM-2, dementia, prior history of CVA, BPH and recent COVID-19 during first week of November presented with weakness, poor oral intake and worsening confusion.  On presentation, she was found to have hypernatremia, AKI and acute metabolic encephalopathy.  MRI of brain was negative for acute abnormalities; UA was negative for UTI; chest x-ray did not show any pneumonia.  He was started on IV fluids.  Palliative care consulted for goals of care discussion.  Assessment & Plan:   Acute metabolic encephalopathy superimposed on dementia -Possibly due to hypernatremia/AKI and dehydration. -MRI of brain was negative for acute abnormalities; UA was negative for UTI; chest x-ray did not show any pneumonia.  -Monitor mental status.  EEG negative for seizures.  Fall precautions.   -PT recommending SNF.  TOC consulted. -Ammonia 15.  B12 not low.  Continue empiric folate acid supplementation -Continue memantine and quetiapine -Use Haldol if needed. -Mental status improving  Hypernatremia -Possibly from poor oral intake and dehydration.  Improved.  Labs pending today.  AKI on CKD stage IIIb -Creatinine improving.  Treated with IV fluids.  Encourage oral intake.  Labs pending today.  Acute metabolic acidosis -Mild.  Monitor.  Currently on oral sodium bicarbonate supplementation  Anemia of chronic disease -From chronic illnesses and renal failure.  Hemoglobin currently stable.  Monitor intermittently  Hypertension hyperlipidemia -Continue amlodipine and statin  Diabetes mellitus type 2 Hypoglycemia -Had episodes of hypoglycemia on 09/10/2023.  Currently on D5 half-normal saline.  Monitor blood sugars.  BPH -Continue tamsulosin  Goals of care -Overall prognosis is guarded to poor.   Palliative care following.  DVT prophylaxis: Heparin subcutaneous Code Status: DNR Family Communication: None at bedside Disposition Plan: Status is: Inpatient Remains inpatient appropriate because: Of severity of illness.  Need for SNF placement    Consultants: palliative care  Procedures: EEG  Antimicrobials: None   Subjective: Patient seen and examined at bedside.  Nursing staff reported episodes of hypoglycemia on 09/10/2023.  Oral intake poor.  No fever, seizures, vomiting reported. Objective: Vitals:   09/10/23 1555 09/10/23 1558 09/10/23 1945 09/11/23 0500  BP: (!) 141/89  (!) 146/68   Pulse: 72 73 64   Resp: 16  18   Temp: 98.6 F (37 C)  98.3 F (36.8 C)   TempSrc:   Oral   SpO2:  98% 98%   Weight:    61 kg  Height:        Intake/Output Summary (Last 24 hours) at 09/11/2023 0811 Last data filed at 09/11/2023 0300 Gross per 24 hour  Intake 1187.56 ml  Output 100 ml  Net 1087.56 ml   Filed Weights   09/08/23 2004 09/09/23 0400 09/11/23 0500  Weight: 63.5 kg 60 kg 61 kg    Examination:  General: Currently on room air.  No distress.  Chronically ill and deconditioned looking  ENT/neck: No JVD elevation or palpable thyromegaly noted  respiratory: Bilateral decreased breath sounds at bases with scattered crackles  CVS: Rate mostly controlled; S1 and S2 are heard  abdominal: Soft, nontender, slightly distended; no organomegaly, bowel sounds are heard Extremities: Trace lower extremity edema; no cyanosis  CNS: More awake this morning and answers some questions but remains confused.  Poor historian.  No focal neurologic deficit.  Moves extremities Lymph: No obvious lymphadenopathy Skin: No  obvious ecchymosis/lesions  psych: Not agitated currently.  Mostly flat affect. musculoskeletal: No obvious joint swelling/deformity     Data Reviewed: I have personally reviewed following labs and imaging studies  CBC: Recent Labs  Lab 09/08/23 1452  Oct 05, 2023 0432 09/10/23 0622  WBC 7.6 8.3 8.6  NEUTROABS  --  5.0  --   HGB 11.1* 10.6* 10.3*  HCT 36.7* 34.1* 32.8*  MCV 100.8* 98.6 96.2  PLT 393 361 323   Basic Metabolic Panel: Recent Labs  Lab 09/08/23 1452 10/05/23 0432 09/10/23 0622  NA 150* 146* 138  K 4.7 4.0 4.3  CL 115* 112* 108  CO2 25 23 20*  GLUCOSE 216* 214* 136*  BUN 45* 40* 33*  CREATININE 2.77* 2.53* 2.22*  CALCIUM 9.2 8.9 8.9   GFR: Estimated Creatinine Clearance: 19.8 mL/min (A) (by C-G formula based on SCr of 2.22 mg/dL (H)). Liver Function Tests: No results for input(s): "AST", "ALT", "ALKPHOS", "BILITOT", "PROT", "ALBUMIN" in the last 168 hours. No results for input(s): "LIPASE", "AMYLASE" in the last 168 hours. Recent Labs  Lab 09/08/23 1958 09/10/23 0622  AMMONIA 15 18   Coagulation Profile: No results for input(s): "INR", "PROTIME" in the last 168 hours. Cardiac Enzymes: No results for input(s): "CKTOTAL", "CKMB", "CKMBINDEX", "TROPONINI" in the last 168 hours. BNP (last 3 results) No results for input(s): "PROBNP" in the last 8760 hours. HbA1C: Recent Labs    2023/10/05 0432  HGBA1C 8.6*   CBG: Recent Labs  Lab 09/10/23 0804 09/10/23 1235 09/10/23 1553 09/10/23 1630 09/10/23 1946  GLUCAP 153* 122* 46* 74 190*   Lipid Profile: No results for input(s): "CHOL", "HDL", "LDLCALC", "TRIG", "CHOLHDL", "LDLDIRECT" in the last 72 hours. Thyroid Function Tests: No results for input(s): "TSH", "T4TOTAL", "FREET4", "T3FREE", "THYROIDAB" in the last 72 hours. Anemia Panel: Recent Labs    09/10/23 0622  VITAMINB12 1,075*   Sepsis Labs: No results for input(s): "PROCALCITON", "LATICACIDVEN" in the last 168 hours.  No results found for this or any previous visit (from the past 240 hour(s)).       Radiology Studies: EEG adult  Result Date: 10/05/2023 Charlsie Quest, MD     05-Oct-2023 12:49 PM Patient Name: Erik Moss MRN: [id number] Epilepsy Attending: Charlsie Quest  Referring Physician/Provider: Maretta Bees, MD Date: 10/05/23 Duration: 26.19 mins Patient history: 89yo M with ams getting eeg to evaluate for seizure. Level of alertness: Awake AEDs during EEG study: None Technical aspects: This EEG study was done with scalp electrodes positioned according to the 10-20 International system of electrode placement. Electrical activity was reviewed with band pass filter of 1-70Hz , sensitivity of 7 uV/mm, display speed of 81mm/sec with a 60Hz  notched filter applied as appropriate. EEG data were recorded continuously and digitally stored.  Video monitoring was available and reviewed as appropriate. Description: The posterior dominant rhythm consists of 7 Hz activity of moderate voltage (25-35 uV) seen predominantly in posterior head regions, symmetric and reactive to eye opening and eye closing. EEG showed continuous generalized 5 to 6 Hz theta slowing. Hyperventilation and photic stimulation were not performed.   Of note, study was technically difficult due to significant myogenic artifact. ABNORMALITY - Continuous slow, generalized IMPRESSION: This technically difficult study is suggestive of mild to moderate diffuse encephalopathy. No seizures or epileptiform discharges were seen throughout the recording. Priyanka Annabelle Harman        Scheduled Meds:  amLODipine  5 mg Oral Daily   atorvastatin  40 mg Oral Daily  folic acid  1 mg Oral Daily   heparin  5,000 Units Subcutaneous Q8H   insulin aspart  0-15 Units Subcutaneous TID WC   insulin aspart  0-5 Units Subcutaneous QHS   memantine  10 mg Oral BID   mirabegron ER  50 mg Oral Daily   QUEtiapine  12.5 mg Oral QHS   sodium bicarbonate  650 mg Oral BID   tamsulosin  0.4 mg Oral QHS   Continuous Infusions:  dextrose 5 % and 0.45 % NaCl 50 mL/hr at 09/10/23 1756          Glade Lloyd, MD [facility] 09/11/2023, 8:11 AM

## 2023-09-11 NOTE — Social Work (Signed)
Please be advised that the above-named patient will require a short-term nursing home stay-anticipated 30 days or less for rehabilitation and strengthening. The plan is for return home.  

## 2023-09-12 DIAGNOSIS — E870 Hyperosmolality and hypernatremia: Secondary | ICD-10-CM | POA: Diagnosis not present

## 2023-09-12 DIAGNOSIS — R531 Weakness: Secondary | ICD-10-CM | POA: Diagnosis not present

## 2023-09-12 DIAGNOSIS — Z515 Encounter for palliative care: Secondary | ICD-10-CM | POA: Diagnosis not present

## 2023-09-12 DIAGNOSIS — Z66 Do not resuscitate: Secondary | ICD-10-CM | POA: Diagnosis not present

## 2023-09-12 LAB — CBC WITH DIFFERENTIAL/PLATELET
Abs Immature Granulocytes: 0.02 10*3/uL (ref 0.00–0.07)
Basophils Absolute: 0 10*3/uL (ref 0.0–0.1)
Basophils Relative: 0 %
Eosinophils Absolute: 0.1 10*3/uL (ref 0.0–0.5)
Eosinophils Relative: 1 %
HCT: 32.6 % — ABNORMAL LOW (ref 39.0–52.0)
Hemoglobin: 10.6 g/dL — ABNORMAL LOW (ref 13.0–17.0)
Immature Granulocytes: 0 %
Lymphocytes Relative: 30 %
Lymphs Abs: 2.4 10*3/uL (ref 0.7–4.0)
MCH: 31.1 pg (ref 26.0–34.0)
MCHC: 32.5 g/dL (ref 30.0–36.0)
MCV: 95.6 fL (ref 80.0–100.0)
Monocytes Absolute: 0.5 10*3/uL (ref 0.1–1.0)
Monocytes Relative: 7 %
Neutro Abs: 4.9 10*3/uL (ref 1.7–7.7)
Neutrophils Relative %: 62 %
Platelets: 325 10*3/uL (ref 150–400)
RBC: 3.41 MIL/uL — ABNORMAL LOW (ref 4.22–5.81)
RDW: 14.1 % (ref 11.5–15.5)
WBC: 7.9 10*3/uL (ref 4.0–10.5)
nRBC: 0 % (ref 0.0–0.2)

## 2023-09-12 LAB — BASIC METABOLIC PANEL
Anion gap: 8 (ref 5–15)
BUN: 39 mg/dL — ABNORMAL HIGH (ref 8–23)
CO2: 23 mmol/L (ref 22–32)
Calcium: 8.8 mg/dL — ABNORMAL LOW (ref 8.9–10.3)
Chloride: 106 mmol/L (ref 98–111)
Creatinine, Ser: 2.33 mg/dL — ABNORMAL HIGH (ref 0.61–1.24)
GFR, Estimated: 26 mL/min — ABNORMAL LOW (ref >60)
Glucose, Bld: 194 mg/dL — ABNORMAL HIGH (ref 70–99)
Potassium: 4 mmol/L (ref 3.5–5.1)
Sodium: 137 mmol/L (ref 135–145)

## 2023-09-12 LAB — GLUCOSE, CAPILLARY
Glucose-Capillary: 216 mg/dL — ABNORMAL HIGH (ref 70–99)
Glucose-Capillary: 179 mg/dL — ABNORMAL HIGH (ref 70–99)
Glucose-Capillary: 158 mg/dL — ABNORMAL HIGH (ref 70–99)
Glucose-Capillary: 166 mg/dL — ABNORMAL HIGH (ref 70–99)

## 2023-09-12 LAB — MAGNESIUM: Magnesium: 2 mg/dL (ref 1.7–2.4)

## 2023-09-12 NOTE — Progress Notes (Signed)
Daily Progress Note   Patient Name: Erik Moss       Date: 09/12/2023 DOB: 1932-12-11  Age: 89 y.o. MRN#: [id number] Attending Physician: Glade Lloyd, MD Primary Care Physician: Harvest Forest, MD Admit Date: 09/08/2023  Reason for Consultation/Follow-up: Establishing goals of care  Subjective: I have reviewed medical records including EPIC notes, MAR, and labs. Patient did not receive haldol last night. Seroquel continued. Received report from primary RN - no acute concerns. Patient no longer needing telesitter. Per RN, patient consumed 100% of breakfast.  Micah Flesher to visit patient at bedside - no family/visitors present. Patient was lying in bed awake and pleasantly confused. No signs or non-verbal gestures of pain or discomfort noted. No respiratory distress, increased work of breathing, or secretions noted.   MOST form at bedside is not completed.   Per conversation with daughter yesterday, she will not be available by phone today - will not attempt to call per her request as she is at work teaching. As discussed yesterday, if she did not complete MOST form last night: form could be completed anytime prior to patient's discharge or outpatient with any provider (specifically Palliative Care provider if questions).   [name] has PMT information should any needs arise.   Length of Stay: 3  Current Medications: Scheduled Meds:   amLODipine  5 mg Oral Daily   atorvastatin  40 mg Oral Daily   folic acid  1 mg Oral Daily   heparin  5,000 Units Subcutaneous Q8H   insulin aspart  0-15 Units Subcutaneous TID WC   insulin aspart  0-5 Units Subcutaneous QHS   memantine  10 mg Oral BID   mirabegron ER  50 mg Oral Daily   QUEtiapine  12.5 mg Oral QHS   sodium bicarbonate  650 mg Oral  BID   tamsulosin  0.4 mg Oral QHS    Continuous Infusions:   PRN Meds: acetaminophen **OR** acetaminophen, albuterol, bisacodyl, haloperidol lactate, ondansetron **OR** ondansetron (ZOFRAN) IV  Physical Exam Vitals and nursing note reviewed.  Constitutional:      General: He is not in acute distress.    Appearance: He is ill-appearing.  Pulmonary:     Effort: No respiratory distress.  Skin:    General: Skin is warm and dry.  Neurological:     Mental Status: He is  alert. Mental status is at baseline. He is disoriented and confused.     Motor: Weakness present.  Psychiatric:        Attention and Perception: Attention normal.        Behavior: Behavior is cooperative.        Cognition and Memory: Cognition is impaired. Memory is impaired.             Vital Signs: BP (!) 95/58   Pulse 91   Temp 97.8 F (36.6 C) (Oral)   Resp 20   Ht 5\' 6"  (1.676 m)   Wt 61.5 kg   SpO2 99%   BMI 21.88 kg/m  SpO2: SpO2: 99 % O2 Device: O2 Device: Room Air O2 Flow Rate:    Intake/output summary:  Intake/Output Summary (Last 24 hours) at 09/12/2023 0981 Last data filed at 09/12/2023 1914 Gross per 24 hour  Intake 1200 ml  Output 1100 ml  Net 100 ml   LBM: Last BM Date : 09/10/23 Baseline Weight: Weight: 63.5 kg Most recent weight: Weight: 61.5 kg       Palliative Assessment/Data: PPS 40%      Patient Active Problem List   Diagnosis Date Noted   Hypernatremia 09/09/2023   Dementia without behavioral disturbance, psychotic disturbance, mood disturbance, or anxiety (HCC) 11/30/2021   Urinary incontinence 11/30/2021   Gait abnormality 11/30/2021   Dementia without behavioral disturbance (HCC) 02/23/2021   Cerebrovascular accident (CVA) (HCC) 02/23/2021   Hyperglycemia due to diabetes mellitus (HCC) 09/24/2020   Acute CVA (cerebrovascular accident) (HCC) 10/08/2019   Type 2 diabetes mellitus (HCC) 10/08/2019   Hyperlipidemia 10/08/2019   BPH (benign prostatic hyperplasia)  10/08/2019   Hypertension 10/08/2019   ARF (acute renal failure) (HCC) 05/08/2019    Palliative Care Assessment & Plan   Patient Profile: 89 y.o. male  with past medical history of  CKD stage IIIb, HTN, HLD, DM-2, dementia, prior history of CVA, BPH  admitted on 09/08/2023 with confusion, weakness, poor oral intake.    During the first week of November-patient was diagnosed with COVID-19 infection. He subsequently completely recovered-and was doing well-however for the past several days-he has been sleeping most the daytime. He now needs more assistance from family for daily activities of living. Prior to this acute event-patient was relatively independent-although he has dementia-he is high functioning when he is in a familiar environment. Currently admitted for AKI and hypernatremia, acute metabolic encephalopathy superimposed on dementia.  Assessment: Principal Problem:   Hypernatremia Active Problems:   Hyperlipidemia   BPH (benign prostatic hyperplasia)   Dementia without behavioral disturbance, psychotic disturbance, mood disturbance, or anxiety (HCC)   Recommendations/Plan: Continue to treat the treatable Continue DNR/DNI as previously documented Goals are for patient's discharge to rehab with outpatient Palliative Care to follow - [facility] consult previously placed  MOST form was not completed by daughter last night. She understands this form can be completed at anytime prior to patient's discharge or outpatient with any provider  PMT will continue to follow peripherally. If there are any imminent needs please call the service directly  Goals of Care and Additional Recommendations: Limitations on Scope of Treatment: Full Scope Treatment and No Tracheostomy  Code Status:    Code Status Orders  (From admission, onward)           Start     Ordered   09/09/23 0736  Do not attempt resuscitation (DNR)- Limited -Do Not Intubate (DNI)  Continuous       Question Answer Comment  If pulseless and not breathing No CPR or chest compressions.   In Pre-Arrest Conditions (Patient Is Breathing and Has A Pulse) Do not intubate. Provide all appropriate non-invasive medical interventions. Avoid ICU transfer unless indicated or required.   Consent: Discussion documented in EHR or advanced directives reviewed      09/09/23 0736           Code Status History     Date Active Date Inactive Code Status Order ID Comments User Context   10/08/2019 1613 10/10/2019 1806 Full Code [id number]  Bobette Mo, MD ED   05/08/2019 1815 05/10/2019 1830 Full Code [id number]  Kathlen Mody, MD ED       Prognosis:  Guarded to poor long-term prognosis given chronic comorbidities and functional/cognitive/nutritional decline   Discharge Planning: Skilled Nursing Facility for rehab with Palliative care service follow-up  Care plan was discussed with primary RN  Thank you for allowing the Palliative Medicine Team to assist in the care of this patient.   Total Time 25 minutes Prolonged Time Billed  no       Haskel Khan, NP  Please contact Palliative Medicine Team phone at [phone] for questions and concerns.   *Portions of this note are a verbal dictation therefore any spelling and/or grammatical errors are due to the "[employer]agon [employer] One" system interpretation.

## 2023-09-12 NOTE — Care Management Important Message (Signed)
Important Message  Patient Details  Name: Kalee Bursey Salls MRN: [id number] Date of Birth: 12-01-1932   Important Message Given:  Yes - Medicare IM     Sherilyn Banker 09/12/2023, 2:06 PM

## 2023-09-12 NOTE — Plan of Care (Signed)

## 2023-09-12 NOTE — Plan of Care (Signed)
  Problem: Pain Management: Goal: General experience of comfort will improve Outcome: Progressing   Problem: Safety: Goal: Ability to remain free from injury will improve Outcome: Progressing

## 2023-09-12 NOTE — Plan of Care (Signed)
  Problem: Coping: Goal: Ability to adjust to condition or change in health will improve Outcome: Progressing   Problem: Fluid Volume: Goal: Ability to maintain a balanced intake and output will improve Outcome: Progressing   Problem: Metabolic: Goal: Ability to maintain appropriate glucose levels will improve Outcome: Progressing   Problem: Skin Integrity: Goal: Risk for impaired skin integrity will decrease Outcome: Progressing   Problem: Coping: Goal: Level of anxiety will decrease Outcome: Progressing   Problem: Safety: Goal: Ability to remain free from injury will improve Outcome: Progressing

## 2023-09-12 NOTE — TOC Progression Note (Addendum)
Transition of Care [facility]) - Progression Note    Patient Details  Name: Ralphel Wohlrab Diffley MRN: [id number] Date of Birth: [birth year]  Transition of [facility]) CM/SW Contact  Carley Hammed, LCSW Phone Number: 09/12/2023, 11:01 AM  Clinical Narrative:    PASRR requesting additional documentation, docs uploaded for review this AM. CSW left VM for dtr yesterday advising of 1 additional bed offer. Per Palliative note, dtr not available today, no additional bed offers this morning. PASRR continues to pend, has not needed any additional Haldol, pt medically ready. TOC continues to follow and work toward SNF placement.   1:00 PASRR approved [id number] E. Left another VM for dtr requesting bed choice in order to start authorization and advising of no additional bed offers at this time. TOC will continue to follow.  Expected Discharge Plan: Skilled Nursing Facility Barriers to Discharge: Continued Medical Work up  Expected Discharge Plan and Services       Living arrangements for the past 2 months: Single Family Home                                       Social Determinants of Health (SDOH) Interventions SDOH Screenings   Food Insecurity: Patient Unable To Answer (09/12/2023)  Housing: Patient Unable To Answer (09/12/2023)  Transportation Needs: Patient Unable To Answer (09/12/2023)  Utilities: Patient Unable To Answer (09/12/2023)  Depression (PHQ2-9): Low Risk  (11/19/2019)  Tobacco Use: Low Risk  (08/23/2023)   Received from [facility]    Readmission Risk Interventions     No data to display

## 2023-09-12 NOTE — Progress Notes (Signed)
   09/12/23 1037  Mobility  Activity Transferred from bed to chair  Level of Assistance Minimal assist, patient does 75% or more (+2)  Assistive Device Front wheel walker  Distance Ambulated (ft) 3 ft  Activity Response Tolerated fair  Mobility Referral Yes  $Mobility charge 1 Mobility  Mobility Specialist Start Time (ACUTE ONLY) 1020  Mobility Specialist Stop Time (ACUTE ONLY) 1037  Mobility Specialist Time Calculation (min) (ACUTE ONLY) 17 min   Mobility Specialist: Progress Note  Pt agreeable to mobility session - received in bed. Required MinA+2 using RW. Pt was asymptomatic throughout session with no complaints. Returned to chair with all needs met - call bell within reach. Chair alarm on.   Pt was fixated on ceiling talking about the light fixture through the session.   Barnie Mort, BS Mobility Specialist Please contact via SecureChat or Rehab office at [phone].

## 2023-09-12 NOTE — Progress Notes (Signed)
PROGRESS NOTE    Erik Moss  JOA:[id number] DOB: April 15, 1933 DOA: 09/08/2023 PCP: Harvest [place], MD   Brief Narrative:  89 y.o. male with medical history significant of CKD stage IIIb, HTN, HLD, DM-2, dementia, prior history of CVA, BPH and recent COVID-19 during first week of November presented with weakness, poor oral intake and worsening confusion.  On presentation, she was found to have hypernatremia, AKI and acute metabolic encephalopathy.  MRI of brain was negative for acute abnormalities; UA was negative for UTI; chest x-ray did not show any pneumonia.  He was started on IV fluids.  Palliative care consulted for goals of care discussion.  PT recommended SNF placement.  Assessment & Plan:   Acute metabolic encephalopathy superimposed on dementia -Possibly due to hypernatremia/AKI and dehydration. -MRI of brain was negative for acute abnormalities; UA was negative for UTI; chest x-ray did not show any pneumonia.  -Monitor mental status.  EEG negative for seizures.  Fall precautions.   -PT recommending SNF.  TOC consulted. -Ammonia 15.  B12 not low.  Continue empiric folate acid supplementation -Continue memantine and quetiapine -Use Haldol if needed. -Mental status improving  Hypernatremia -Possibly from poor oral intake and dehydration.  Improved.  Labs pending today.  AKI on CKD stage IIIb -Creatinine improving.  Treated with IV fluids and discontinued.  Encourage oral intake.  Labs pending today.  Acute metabolic acidosis -Improved.  Currently on oral sodium bicarbonate supplementation  Anemia of chronic disease -From chronic illnesses and renal failure.  Hemoglobin currently stable.  Monitor intermittently  Hypertension hyperlipidemia -Continue amlodipine and statin  Diabetes mellitus type 2 Hypoglycemia -Had episodes of hypoglycemia on 09/10/2023.  Blood sugars improving.  Off IV fluids.  BPH -Continue tamsulosin  Goals of care -Overall prognosis is  guarded to poor.  Palliative care following.  DVT prophylaxis: Heparin subcutaneous Code Status: DNR Family Communication: None at bedside Disposition Plan: Status is: Inpatient Remains inpatient appropriate because: Of severity of illness.  Need for SNF placement.  Currently medically stable for discharge to SNF.    Consultants: palliative care  Procedures: EEG  Antimicrobials: None   Subjective: Patient seen and examined at bedside.  No agitation, fever, vomiting reported.  Objective: Vitals:   09/11/23 0900 09/11/23 1547 09/11/23 2000 09/12/23 0500  BP: (!) 157/74 117/63 (!) 110/57   Pulse:  73 73   Resp:  16 16   Temp:  98 F (36.7 C) 98.1 F (36.7 C)   TempSrc:  Oral Oral   SpO2:  100% 100%   Weight:    61.5 kg  Height:        Intake/Output Summary (Last 24 hours) at 09/12/2023 0730 Last data filed at 09/12/2023 0400 Gross per 24 hour  Intake 1080 ml  Output 1100 ml  Net -20 ml   Filed Weights   09/09/23 0400 09/11/23 0500 09/12/23 0500  Weight: 60 kg 61 kg 61.5 kg    Examination:  General: No acute distress.  On room air.  Chronically ill and deconditioned looking  ENT/neck: No obvious neck masses or JVD elevation noted  respiratory: Decreased breath sounds at bases bilaterally with some crackles  CVS: S1-S2 heard; currently rate controlled abdominal: Soft, nontender, remains mildly distended; no organomegaly, bowel sounds are heard normally Extremities: No clubbing; mild lower extremity edema present CNS: Awake, still slow to respond but answers some questions.  Slightly confused.  No focal neurologic deficit.  Able to move extremities Lymph: No obvious palpable lymphadenopathy Skin: No obvious  petechiae/rashes  psych: Flat affect.  Currently not agitated  musculoskeletal: No obvious joint tenderness/erythema    Data Reviewed: I have personally reviewed following labs and imaging studies  CBC: Recent Labs  Lab 09/08/23 1452 09/09/23 0432  09/10/23 0622 09/11/23 0830  WBC 7.6 8.3 8.6 8.5  NEUTROABS  --  5.0  --  5.1  HGB 11.1* 10.6* 10.3* 11.2*  HCT 36.7* 34.1* 32.8* 35.4*  MCV 100.8* 98.6 96.2 94.9  PLT 393 361 323 349   Basic Metabolic Panel: Recent Labs  Lab 09/08/23 1452 09/09/23 0432 09/10/23 0622 09/11/23 0830  NA 150* 146* 138 137  K 4.7 4.0 4.3 4.2  CL 115* 112* 108 108  CO2 25 23 20* 23  GLUCOSE 216* 214* 136* 142*  BUN 45* 40* 33* 29*  CREATININE 2.77* 2.53* 2.22* 2.28*  CALCIUM 9.2 8.9 8.9 8.8*  MG  --   --   --  2.1   GFR: Estimated Creatinine Clearance: 19.5 mL/min (A) (by C-G formula based on SCr of 2.28 mg/dL (H)). Liver Function Tests: Recent Labs  Lab 09/11/23 0830  AST 15  ALT 14  ALKPHOS 81  BILITOT 0.8  PROT 6.0*  ALBUMIN 3.2*   No results for input(s): "LIPASE", "AMYLASE" in the last 168 hours. Recent Labs  Lab 09/08/23 1958 09/10/23 0622  AMMONIA 15 18   Coagulation Profile: No results for input(s): "INR", "PROTIME" in the last 168 hours. Cardiac Enzymes: No results for input(s): "CKTOTAL", "CKMB", "CKMBINDEX", "TROPONINI" in the last 168 hours. BNP (last 3 results) No results for input(s): "PROBNP" in the last 8760 hours. HbA1C: No results for input(s): "HGBA1C" in the last 72 hours.  CBG: Recent Labs  Lab 09/10/23 1946 09/11/23 0831 09/11/23 1156 09/11/23 1653 09/11/23 2005  GLUCAP 190* 126* 186* 135* 171*   Lipid Profile: No results for input(s): "CHOL", "HDL", "LDLCALC", "TRIG", "CHOLHDL", "LDLDIRECT" in the last 72 hours. Thyroid Function Tests: No results for input(s): "TSH", "T4TOTAL", "FREET4", "T3FREE", "THYROIDAB" in the last 72 hours. Anemia Panel: Recent Labs    09/10/23 0622  VITAMINB12 1,075*   Sepsis Labs: No results for input(s): "PROCALCITON", "LATICACIDVEN" in the last 168 hours.  No results found for this or any previous visit (from the past 240 hour(s)).       Radiology Studies: No results found.      Scheduled Meds:   amLODipine  5 mg Oral Daily   atorvastatin  40 mg Oral Daily   folic acid  1 mg Oral Daily   heparin  5,000 Units Subcutaneous Q8H   insulin aspart  0-15 Units Subcutaneous TID WC   insulin aspart  0-5 Units Subcutaneous QHS   memantine  10 mg Oral BID   mirabegron ER  50 mg Oral Daily   QUEtiapine  12.5 mg Oral QHS   sodium bicarbonate  650 mg Oral BID   tamsulosin  0.4 mg Oral QHS   Continuous Infusions:          Glade Lloyd, MD [facility] 09/12/2023, 7:30 AM

## 2023-09-13 ENCOUNTER — Ambulatory Visit: Payer: Medicare HMO | Admitting: Adult Health

## 2023-09-13 DIAGNOSIS — E870 Hyperosmolality and hypernatremia: Secondary | ICD-10-CM | POA: Diagnosis not present

## 2023-09-13 LAB — BASIC METABOLIC PANEL
Anion gap: 10 (ref 5–15)
BUN: 44 mg/dL — ABNORMAL HIGH (ref 8–23)
CO2: 19 mmol/L — ABNORMAL LOW (ref 22–32)
Calcium: 9.3 mg/dL (ref 8.9–10.3)
Chloride: 108 mmol/L (ref 98–111)
Creatinine, Ser: 2.26 mg/dL — ABNORMAL HIGH (ref 0.61–1.24)
GFR, Estimated: 27 mL/min — ABNORMAL LOW (ref >60)
Glucose, Bld: 149 mg/dL — ABNORMAL HIGH (ref 70–99)
Potassium: 5.2 mmol/L — ABNORMAL HIGH (ref 3.5–5.1)
Sodium: 137 mmol/L (ref 135–145)

## 2023-09-13 LAB — CBC WITH DIFFERENTIAL/PLATELET
Abs Immature Granulocytes: 0.03 10*3/uL (ref 0.00–0.07)
Basophils Absolute: 0 10*3/uL (ref 0.0–0.1)
Basophils Relative: 0 %
Eosinophils Absolute: 0.1 10*3/uL (ref 0.0–0.5)
Eosinophils Relative: 1 %
HCT: 31.6 % — ABNORMAL LOW (ref 39.0–52.0)
Hemoglobin: 10.1 g/dL — ABNORMAL LOW (ref 13.0–17.0)
Immature Granulocytes: 0 %
Lymphocytes Relative: 25 %
Lymphs Abs: 2.3 10*3/uL (ref 0.7–4.0)
MCH: 30.7 pg (ref 26.0–34.0)
MCHC: 32 g/dL (ref 30.0–36.0)
MCV: 96 fL (ref 80.0–100.0)
Monocytes Absolute: 0.8 10*3/uL (ref 0.1–1.0)
Monocytes Relative: 9 %
Neutro Abs: 5.9 10*3/uL (ref 1.7–7.7)
Neutrophils Relative %: 65 %
Platelets: 300 10*3/uL (ref 150–400)
RBC: 3.29 MIL/uL — ABNORMAL LOW (ref 4.22–5.81)
RDW: 14.2 % (ref 11.5–15.5)
WBC: 9.2 10*3/uL (ref 4.0–10.5)
nRBC: 0 % (ref 0.0–0.2)

## 2023-09-13 LAB — GLUCOSE, CAPILLARY
Glucose-Capillary: 139 mg/dL — ABNORMAL HIGH (ref 70–99)
Glucose-Capillary: 196 mg/dL — ABNORMAL HIGH (ref 70–99)
Glucose-Capillary: 133 mg/dL — ABNORMAL HIGH (ref 70–99)
Glucose-Capillary: 95 mg/dL (ref 70–99)

## 2023-09-13 LAB — MAGNESIUM: Magnesium: 1.7 mg/dL (ref 1.7–2.4)

## 2023-09-13 NOTE — Progress Notes (Signed)
PROGRESS NOTE    Erik Moss  ZOX:[id number] DOB: 1933/03/02 DOA: 09/08/2023 PCP: [place], MD    Brief Narrative:   Erik Moss is a 89 y.o. male with past medical history significant for HTN, HLD, DM2, BPH, anemia of chronic medical disease, CKD stage IIIb, dementia, recent Covid-19 viral infection who presented to [facility] ED on 11/15 with progressive weakness, confusion and poor oral intake.  On presentation, she was found to have hypernatremia, AKI and acute metabolic encephalopathy.  MRI of brain was negative for acute abnormalities; UA was negative for UTI; chest x-ray did not show any pneumonia.  He was started on IV fluids.  Palliative care consulted for goals of care discussion.  PT recommended SNF placement.   Assessment & Plan:   Acute metabolic encephalopathy superimposed on dementia Patient presenting to the ED with confusion, progressive weakness and poor oral intake.  Recent Cova-19 viral infection likely complicating factor.  On admission patient was noted to be hyponatremic with elevated creatinine likely secondary to dehydration.  MRI brain negative for acute abnormalities.  Urinalysis negative for UTI, chest x-ray negative for focal consolidation.  EEG was negative for seizures.  Ammonia level 15, B12 level within normal limits.  Etiology likely secondary to adult failure to thrive in the setting of poor oral intake with recent Cova-19 viral infection.  Improved with IV fluid hydration. --Memantine 10 mg p.o. twice daily --Seroquel 12.5 mg p.o. nightly --Supportive care --Delirium precautions --Get up during the day --Encourage a familiar face to remain present throughout the day --Keep blinds open and lights on during daylight hours --Minimize the use of opioids/benzodiazepines --Pending SNF placement   Hypernatremia: Resolved Sodium 150 on admission, likely hypovolemic hypernatremia in the setting of poor oral intake/dehydration.  Supported with IV  fluid hydration with improvement of sodium to 137. --Continue to encourage increased oral intake   AKI on CKD stage IIIb Creatinine 2.77 on admission, likely secondary to prerenal azotemia in the setting of severe dehydration from poor oral intake.  Baseline creatinine 1.7-2.2. -- Cr 2.77>>2.26 -- Continue to encourage increased oral intake   Acute metabolic acidosis --Sodium bicarbonate 600 mg p.o. twice daily   Anemia of chronic medical/renal disease -- hgb 10.1, stable.   Hypertension Hyperlipidemia -- Amlodipine 5 mg p.o. daily -- Atorvastatin 40 mg p.o. daily   Diabetes mellitus type 2 Hypoglycemia Episodes of hypoglycemia on 09/10/2023, blood sugars improved.  Now transition off of IV fluids.  Appetite improved.  Hemoglobin A1c 8.6 on 09/09/2023.  Home regimen includes Januvia. -- moderate SSI for coverage -- CBGs qAC/HS   BPH -- Tamsulosin 0.4 mg p.o. nightly   Goals of care -- Overall prognosis is guarded to poor.  Palliative care following.   DVT prophylaxis: heparin injection 5,000 Units Start: 09/09/23 1400    Code Status: Limited: Do not attempt resuscitation (DNR) -DNR-LIMITED -Do Not Intubate/DNI  Family Communication: No family present at bedside this morning  Disposition Plan:  Level of care: Med-Surg Status is: Inpatient Remains inpatient appropriate because: Pending SNF placement, medically stable for discharge once bed available with insurance authorization    Consultants:  Palliative care  Procedures:  None  Antimicrobials:  None   Subjective: Patient seen examined bedside, resting calmly.  Working with occupational therapy.  No specific complaints this morning.  Reports appetite improving.  Awaiting SNF placement.  Denies headache, no chest pain, no shortness of breath, no abdominal pain.  No acute events overnight per nursing staff.  Objective:  Vitals:   09/12/23 1637 09/12/23 1946 09/13/23 0400 09/13/23 0800  BP: (!) 104/58 (!) 134/101  136/69 (!) 134/93  Pulse: 76 80 86   Resp:  18 18 16   Temp:  98.1 F (36.7 C) 98.1 F (36.7 C) 97.9 F (36.6 C)  TempSrc:  Oral Oral Oral  SpO2:  99% 99% 99%  Weight:   61.5 kg   Height:        Intake/Output Summary (Last 24 hours) at 09/13/2023 1448 Last data filed at 09/13/2023 8756 Gross per 24 hour  Intake 720 ml  Output 2500 ml  Net -1780 ml   Filed Weights   09/11/23 0500 09/12/23 0500 09/13/23 0400  Weight: 61 kg 61.5 kg 61.5 kg    Examination:  Physical Exam: GEN: NAD, alert, chronically ill appearance, appears older than stated age HEENT: NCAT, PERRL, EOMI, sclera clear, MMM PULM: CTAB w/o wheezes/crackles, normal respiratory effort, on room air CV: RRR w/o M/G/R GI: abd soft, NTND, NABS, no R/G/M MSK: no peripheral edema, muscle strength globally intact 5/5 bilateral upper/lower extremities NEURO: CN II-XII intact, no focal deficits, sensation to light touch intact PSYCH: normal mood/affect Integumentary: No concerning rashes/lesions/wounds noted exposed skin surfaces    Data Reviewed: I have personally reviewed following labs and imaging studies  CBC: Recent Labs  Lab 09/09/23 0432 09/10/23 0622 09/11/23 0830 09/12/23 0752 09/13/23 0743  WBC 8.3 8.6 8.5 7.9 9.2  NEUTROABS 5.0  --  5.1 4.9 5.9  HGB 10.6* 10.3* 11.2* 10.6* 10.1*  HCT 34.1* 32.8* 35.4* 32.6* 31.6*  MCV 98.6 96.2 94.9 95.6 96.0  PLT 361 323 349 325 300   Basic Metabolic Panel: Recent Labs  Lab 09/09/23 0432 09/10/23 0622 09/11/23 0830 09/12/23 0752 09/13/23 0743  NA 146* 138 137 137 137  K 4.0 4.3 4.2 4.0 5.2*  CL 112* 108 108 106 108  CO2 23 20* 23 23 19*  GLUCOSE 214* 136* 142* 194* 149*  BUN 40* 33* 29* 39* 44*  CREATININE 2.53* 2.22* 2.28* 2.33* 2.26*  CALCIUM 8.9 8.9 8.8* 8.8* 9.3  MG  --   --  2.1 2.0 1.7   GFR: Estimated Creatinine Clearance: 19.7 mL/min (A) (by C-G formula based on SCr of 2.26 mg/dL (H)). Liver Function Tests: Recent Labs  Lab  09/11/23 0830  AST 15  ALT 14  ALKPHOS 81  BILITOT 0.8  PROT 6.0*  ALBUMIN 3.2*   No results for input(s): "LIPASE", "AMYLASE" in the last 168 hours. Recent Labs  Lab 09/08/23 1958 09/10/23 0622  AMMONIA 15 18   Coagulation Profile: No results for input(s): "INR", "PROTIME" in the last 168 hours. Cardiac Enzymes: No results for input(s): "CKTOTAL", "CKMB", "CKMBINDEX", "TROPONINI" in the last 168 hours. BNP (last 3 results) No results for input(s): "PROBNP" in the last 8760 hours. HbA1C: No results for input(s): "HGBA1C" in the last 72 hours. CBG: Recent Labs  Lab 09/12/23 1144 09/12/23 1635 09/12/23 1947 09/13/23 0834 09/13/23 1212  GLUCAP 179* 158* 166* 139* 196*   Lipid Profile: No results for input(s): "CHOL", "HDL", "LDLCALC", "TRIG", "CHOLHDL", "LDLDIRECT" in the last 72 hours. Thyroid Function Tests: No results for input(s): "TSH", "T4TOTAL", "FREET4", "T3FREE", "THYROIDAB" in the last 72 hours. Anemia Panel: No results for input(s): "VITAMINB12", "FOLATE", "FERRITIN", "TIBC", "IRON", "RETICCTPCT" in the last 72 hours. Sepsis Labs: No results for input(s): "PROCALCITON", "LATICACIDVEN" in the last 168 hours.  No results found for this or any previous visit (from the past 240 hour(s)).  Radiology Studies: No results found.      Scheduled Meds:  amLODipine  5 mg Oral Daily   atorvastatin  40 mg Oral Daily   folic acid  1 mg Oral Daily   heparin  5,000 Units Subcutaneous Q8H   insulin aspart  0-15 Units Subcutaneous TID WC   insulin aspart  0-5 Units Subcutaneous QHS   memantine  10 mg Oral BID   mirabegron ER  50 mg Oral Daily   QUEtiapine  12.5 mg Oral QHS   sodium bicarbonate  650 mg Oral BID   tamsulosin  0.4 mg Oral QHS   Continuous Infusions:   LOS: 4 days    Time spent: 48 minutes spent on chart review, discussion with nursing staff, consultants, updating family and interview/physical exam; more than 50% of that time was spent  in counseling and/or coordination of care.    Alvira Philips [place], DO [facility] Available via Epic secure chat 7am-7pm After these hours, please refer to coverage provider listed on [email or web address] 09/13/2023, 2:48 PM

## 2023-09-13 NOTE — Plan of Care (Signed)
  Problem: Pain Management: Goal: General experience of comfort will improve Outcome: Progressing   Problem: Safety: Goal: Ability to remain free from injury will improve Outcome: Progressing

## 2023-09-13 NOTE — TOC Progression Note (Signed)
Transition of Care [facility]) - Progression Note    Patient Details  Name: Valerie Exposito Solis MRN: [id number] Date of Birth: 12-29-1932  [facility] CM/SW Contact  Carley Hammed, LCSW Phone Number: 09/13/2023, 10:10 AM  Clinical Narrative:    CSW spoke with pt's daughter to discuss bed choice and next steps. Pt's daughter chose [place]. Authorization is pending at this time. Facility agreeable to admission when Berkley Harvey is approved. Per MD, pt can DC as soon as Berkley Harvey is approved. TOC will continue to follow.    Expected Discharge Plan: Skilled Nursing Facility Barriers to Discharge: Continued Medical Work up  Expected Discharge Plan and Services       Living arrangements for the past 2 months: Single Family Home                                       Social Determinants of Health (SDOH) Interventions SDOH Screenings   Food Insecurity: Patient Unable To Answer (09/12/2023)  Housing: Patient Unable To Answer (09/12/2023)  Transportation Needs: Patient Unable To Answer (09/12/2023)  Utilities: Patient Unable To Answer (09/12/2023)  Depression (PHQ2-9): Low Risk  (11/19/2019)  Tobacco Use: Low Risk  (08/23/2023)   Received from [facility]    Readmission Risk Interventions     No data to display

## 2023-09-13 NOTE — Progress Notes (Signed)
Occupational Therapy Treatment Patient Details Name: Erik Moss MRN: [id number] DOB: 07-07-1933 Today's Date: 09/13/2023   History of present illness Pt is a 89 y.o. M who presents 09/08/2023 with weakness. Found to have hyponatremia and AKI. Significant PMH: CVA, HTN, CKD stage IIIb, HLD, DM-2, dementia, BPH.   OT comments  Patient received in supine and agreeable to OT session. Patient performed grooming seated in recliner with cues for sequencing and assistance with toothpaste. Patient stood from recliner to RW with min/mod assist and was found to have had BM in chair. Patient transferred to [facility] with step pivot and completed BM and stood for toilet hygiene with max assist. Patient will benefit from continued inpatient follow up therapy, <3 hours/day to continue to address bathing, dressing, and toilet transfers. Acute OT to continue to follow.       If plan is discharge home, recommend the following:  Assist for transportation;Assistance with cooking/housework;A lot of help with walking and/or transfers;A lot of help with bathing/dressing/bathroom;Supervision due to cognitive status   Equipment Recommendations  BSC/3in1    Recommendations for Other Services      Precautions / Restrictions Precautions Precautions: Fall Restrictions Weight Bearing Restrictions: No       Mobility Bed Mobility Overal bed mobility: Needs Assistance             General bed mobility comments: OOB in recliner    Transfers Overall transfer level: Needs assistance Equipment used: Rolling walker (2 wheels), None Transfers: Sit to/from Stand, Bed to chair/wheelchair/BSC Sit to Stand: Min assist, Mod assist     Step pivot transfers: Mod assist     General transfer comment: min to mod assist to stand from recliner and BSC, assistance with RW management and balance with transfers     Balance Overall balance assessment: Needs assistance Sitting-balance support: Feet supported Sitting  balance-Leahy Scale: Fair Sitting balance - Comments: in recliner   Standing balance support: Bilateral upper extremity supported Standing balance-Leahy Scale: Poor Standing balance comment: reliant on BUE support when standing                           ADL either performed or assessed with clinical judgement   ADL Overall ADL's : Needs assistance/impaired Eating/Feeding: Set up;Sitting   Grooming: Wash/dry hands;Wash/dry face;Oral care;Minimal assistance;Cueing for sequencing;Sitting Grooming Details (indicate cue type and reason): required cueing for sequencing and assistance with toothpaste                 Toilet Transfer: Moderate assistance;BSC/3in1;Rolling walker (2 wheels) Toilet Transfer Details (indicate cue type and reason): cues for hand placement and assistance with sit to stand and walker management Toileting- Clothing Manipulation and Hygiene: Maximal assistance;Sit to/from stand Toileting - Clothing Manipulation Details (indicate cue type and reason): patient stood for toilet hygiene with BUE support for balance       General ADL Comments: Patient unaware he had BM in chair    Extremity/Trunk Assessment              Vision       Perception     Praxis      Cognition Arousal: Alert Behavior During Therapy: WFL for tasks assessed/performed Overall Cognitive Status: History of cognitive impairments - at baseline                                 General Comments: history of  dementia, pleasant and tangential.        Exercises      Shoulder Instructions       General Comments      Pertinent Vitals/ Pain       Pain Assessment Pain Assessment: Faces Faces Pain Scale: No hurt Pain Intervention(s): Monitored during session  Home Living                                          Prior Functioning/Environment              Frequency  Min 1X/week        Progress Toward Goals  OT  Goals(current goals can now be found in the care plan section)  Progress towards OT goals: Progressing toward goals  Acute Rehab OT Goals OT Goal Formulation: Patient unable to participate in goal setting Time For Goal Achievement: 09/25/23 Potential to Achieve Goals: Good ADL Goals Pt Will Perform Grooming: with set-up;with supervision;sitting Pt Will Perform Upper Body Bathing: with min assist;sitting Pt Will Transfer to Toilet: with min assist;ambulating Pt Will Perform Toileting - Clothing Manipulation and hygiene: with min assist;sit to/from stand  Plan      Co-evaluation                 AM-PAC OT "6 Clicks" Daily Activity     Outcome Measure   Help from another person eating meals?: A Little Help from another person taking care of personal grooming?: A Little Help from another person toileting, which includes using toliet, bedpan, or urinal?: A Lot Help from another person bathing (including washing, rinsing, drying)?: A Lot Help from another person to put on and taking off regular upper body clothing?: A Lot Help from another person to put on and taking off regular lower body clothing?: A Lot 6 Click Score: 14    End of Session Equipment Utilized During Treatment: Gait belt;Rolling walker (2 wheels)  OT Visit Diagnosis: Unsteadiness on feet (R26.81);Other symptoms and signs involving cognitive function   Activity Tolerance Patient tolerated treatment well   Patient Left in chair;with call bell/phone within reach;with chair alarm set   Nurse Communication Mobility status        Time: 4098-1191 OT Time Calculation (min): 20 min  Charges: OT General Charges $OT Visit: 1 Visit OT Treatments $Self Care/Home Management : 8-22 mins  Alfonse Flavors, OTA Acute Rehabilitation Services  Office [phone]   Dewain Penning 09/13/2023, 1:07 PM

## 2023-09-14 DIAGNOSIS — I10 Essential (primary) hypertension: Secondary | ICD-10-CM | POA: Diagnosis not present

## 2023-09-14 DIAGNOSIS — E119 Type 2 diabetes mellitus without complications: Secondary | ICD-10-CM | POA: Diagnosis not present

## 2023-09-14 DIAGNOSIS — Z8673 Personal history of transient ischemic attack (TIA), and cerebral infarction without residual deficits: Secondary | ICD-10-CM | POA: Diagnosis not present

## 2023-09-14 DIAGNOSIS — Z7984 Long term (current) use of oral hypoglycemic drugs: Secondary | ICD-10-CM | POA: Diagnosis not present

## 2023-09-14 DIAGNOSIS — D638 Anemia in other chronic diseases classified elsewhere: Secondary | ICD-10-CM | POA: Diagnosis not present

## 2023-09-14 DIAGNOSIS — N179 Acute kidney failure, unspecified: Secondary | ICD-10-CM | POA: Diagnosis not present

## 2023-09-14 DIAGNOSIS — R5381 Other malaise: Secondary | ICD-10-CM | POA: Diagnosis not present

## 2023-09-14 DIAGNOSIS — I152 Hypertension secondary to endocrine disorders: Secondary | ICD-10-CM | POA: Diagnosis not present

## 2023-09-14 DIAGNOSIS — K5901 Slow transit constipation: Secondary | ICD-10-CM | POA: Diagnosis not present

## 2023-09-14 DIAGNOSIS — R404 Transient alteration of awareness: Secondary | ICD-10-CM | POA: Diagnosis not present

## 2023-09-14 DIAGNOSIS — Z7401 Bed confinement status: Secondary | ICD-10-CM | POA: Diagnosis not present

## 2023-09-14 DIAGNOSIS — F0390 Unspecified dementia without behavioral disturbance: Secondary | ICD-10-CM | POA: Diagnosis not present

## 2023-09-14 DIAGNOSIS — Z8616 Personal history of COVID-19: Secondary | ICD-10-CM | POA: Diagnosis not present

## 2023-09-14 DIAGNOSIS — E870 Hyperosmolality and hypernatremia: Secondary | ICD-10-CM | POA: Diagnosis not present

## 2023-09-14 DIAGNOSIS — E1159 Type 2 diabetes mellitus with other circulatory complications: Secondary | ICD-10-CM | POA: Diagnosis not present

## 2023-09-14 DIAGNOSIS — N1832 Chronic kidney disease, stage 3b: Secondary | ICD-10-CM | POA: Diagnosis not present

## 2023-09-14 DIAGNOSIS — I679 Cerebrovascular disease, unspecified: Secondary | ICD-10-CM | POA: Diagnosis not present

## 2023-09-14 DIAGNOSIS — R627 Adult failure to thrive: Secondary | ICD-10-CM | POA: Diagnosis not present

## 2023-09-14 DIAGNOSIS — N400 Enlarged prostate without lower urinary tract symptoms: Secondary | ICD-10-CM | POA: Diagnosis not present

## 2023-09-14 DIAGNOSIS — E785 Hyperlipidemia, unspecified: Secondary | ICD-10-CM | POA: Diagnosis not present

## 2023-09-14 DIAGNOSIS — Z9189 Other specified personal risk factors, not elsewhere classified: Secondary | ICD-10-CM | POA: Diagnosis not present

## 2023-09-14 DIAGNOSIS — E1122 Type 2 diabetes mellitus with diabetic chronic kidney disease: Secondary | ICD-10-CM | POA: Diagnosis not present

## 2023-09-14 DIAGNOSIS — F0150 Vascular dementia without behavioral disturbance: Secondary | ICD-10-CM | POA: Diagnosis not present

## 2023-09-14 LAB — GLUCOSE, CAPILLARY: Glucose-Capillary: 128 mg/dL — ABNORMAL HIGH (ref 70–99)

## 2023-09-14 MED ORDER — QUETIAPINE FUMARATE 25 MG PO TABS: 12.5000 mg | ORAL_TABLET | Freq: Every day | ORAL | 0 refills | Status: DC

## 2023-09-14 MED ORDER — FOLIC ACID 1 MG PO TABS: 1.0000 mg | ORAL_TABLET | Freq: Every day | ORAL | Status: DC

## 2023-09-14 NOTE — Progress Notes (Addendum)
Physical Therapy Treatment Patient Details Name: Erik Moss MRN: [id number] DOB: [birth year] Today's Date: 09/14/2023   History of Present Illness Pt is a 89 y.o. M who presents 09/08/2023 with weakness. Found to have hyponatremia and AKI. Symptomatic orthostatic hypotension during PT session 11/21 just prior to DC. Significant PMH: CVA, HTN, CKD stage IIIb, HLD, DM-2, dementia, BPH.    PT Comments  Pt received in supine, agreeable to therapy session but lethargic, pt opens eyes briefly to cues and following simple commands with multimodal cues but remains drowsy throughout. Pt noted to have soiled bed pad upon staff arrival so pt rolled multiple times for peri-care (totalA for hygiene assist) and performed step pivot transfer to/from [facility] with +2 modA progressing to +2 maxA due to pt with likely pre-syncopal symptoms after BM and sitting >5 mins on BSC. See BP taken below, seated EOB then seated on BSC prior to return transfer to supine. Pt given sips of water after return to supine and BP reading improved. RN notified of pt BP readings, improved once returned to supine, and transport team arriving to pt room at end of session to transport to SNF. Recommend TED hose/abdominal binder trial in subsequent PT sessions to stabilize BP prior to gait progression.  Orthostatic Lying   BP- Lying 118/60  Pulse- Lying 80  Orthostatic Sitting  BP- Sitting 108/56  Pulse- Sitting 87   Orthostatic Sitting  BP- Sitting (!) 55/41 (sitting on BSC after ~5 mins)  Pulse- Sitting 85   Orthostatic Lying (HOB zero degrees after return to supine)  BP- Lying 139/64     If plan is discharge home, recommend the following: A little help with bathing/dressing/bathroom;Assistance with cooking/housework;Direct supervision/assist for medications management;Direct supervision/assist for financial management;Assist for transportation;Help with stairs or ramp for entrance;Two people to help with walking and/or  transfers;Supervision due to cognitive status   Can travel by private vehicle      (would have trouble today due to soft BP with upright activity)  Equipment Recommendations  None recommended by PT (TBD post-acute)    Recommendations for Other Services       Precautions / Restrictions Precautions Precautions: Fall Precaution Comments: watch BP, orthostatic Restrictions Weight Bearing Restrictions: No     Mobility  Bed Mobility Overal bed mobility: Needs Assistance Bed Mobility: Sit to Supine, Rolling, Sidelying to Sit Rolling: Min assist, Used rails Sidelying to sit: Mod assist, Used rails   Sit to supine: Max assist, +2 for physical assistance   General bed mobility comments: Increased assist returning to supine due to increased lethargy/fatigue. Possible pre-syncopal with return to supine (see BP)    Transfers Overall transfer level: Needs assistance Equipment used: 2 person hand held assist Transfers: Sit to/from Stand, Bed to chair/wheelchair/BSC Sit to Stand: Mod assist, +2 safety/equipment   Step pivot transfers: Mod assist, +2 physical assistance, +2 safety/equipment, Max assist       General transfer comment: defer RW as pt more lethargic today, utilized +2 HHA from MS and PTA, pivot to/from [facility], modA to get to [facility]; increased assist returning from [facility] due to likely pre-syncopal symptoms (see BP) and pt unable to maintain full upright posture, staying flexed, but still following commands to take a couple steps back to EOB with mod cues and +2 maxA returning.    Ambulation/Gait               General Gait Details: defer, see BP   Stairs  Wheelchair Mobility     Tilt Bed    Modified Rankin (Stroke Patients Only)       Balance Overall balance assessment: Needs assistance Sitting-balance support: Feet supported Sitting balance-Leahy Scale: Poor Sitting balance - Comments: fair initially (prior to step pivot), poor after due to  soft BP   Standing balance support: Bilateral upper extremity supported Standing balance-Leahy Scale: Poor (Poor to zero) Standing balance comment: reliant on BUE support when standing                            Cognition Arousal: Lethargic Behavior During Therapy: WFL for tasks assessed/performed Overall Cognitive Status: History of cognitive impairments - at baseline                                 General Comments: More lethargic today, pt unreliable reporter of symptoms but when asked if he feels lightheaded or dizzy he states yes, so BP checked pre/post transfer to [facility] and very significant drop in BP, RN notified. Pt tends to keep eyes closed unless cued to open them, only keeps them open briefly to command.        Exercises      General Comments General comments (skin integrity, edema, etc.): BP improved after return to supine but very orthostatic after BM on BSC; pt following commands to drink water after return to supine (HOB >40*)      Pertinent Vitals/Pain Pain Assessment Pain Assessment: PAINAD Faces Pain Scale: No hurt Breathing: normal Negative Vocalization: occasional moan/groan, low speech, negative/disapproving quality Facial Expression: facial grimacing Body Language: relaxed Consolability: distracted or reassured by voice/touch PAINAD Score: 4 Pain Location: c/o pain with urination 11/21, PTA noted condom cath was twisted at proximal connection, line untwisted and pt reports pain lessened Pain Descriptors / Indicators: Grimacing, Discomfort, Sharp Pain Intervention(s): Limited activity within patient's tolerance, Monitored during session, Repositioned    Home Living                          Prior Function            PT Goals (current goals can now be found in the care plan section) Acute Rehab PT Goals Patient Stated Goal: did not state PT Goal Formulation: With patient Time For Goal Achievement: 09/23/23 Progress  towards PT goals: Progressing toward goals    Frequency    Min 1X/week      PT Plan      Co-evaluation              AM-PAC PT "6 Clicks" Mobility   Outcome Measure  Help needed turning from your back to your side while in a flat bed without using bedrails?: A Little Help needed moving from lying on your back to sitting on the side of a flat bed without using bedrails?: A Lot Help needed moving to and from a bed to a chair (including a wheelchair)?: A Lot Help needed standing up from a chair using your arms (e.g., wheelchair or bedside chair)?: A Lot Help needed to walk in hospital room?: Total Help needed climbing 3-5 steps with a railing? : Total 6 Click Score: 11    End of Session Equipment Utilized During Treatment: Gait belt Activity Tolerance: Patient limited by lethargy;Treatment limited secondary to medical complications (Comment);Other (comment) (symptomatic orthostatic hypotension) Patient left: in bed;with call  bell/phone within reach;with bed alarm set Nurse Communication: Mobility status;Other (comment) (likely pre-syncopal symptoms but BP improved in supine) PT Visit Diagnosis: Unsteadiness on feet (R26.81)     Time: 9629-5284 PT Time Calculation (min) (ACUTE ONLY): 26 min  Charges:    $Therapeutic Activity: 23-37 mins PT General Charges $$ ACUTE PT VISIT: 1 Visit                     Shimika Ames P., PTA Acute Rehabilitation Services Secure Chat Preferred 9a-5:30pm Office: [phone]    Dorathy Kinsman [facility] 09/14/2023, 11:46 AM

## 2023-09-14 NOTE — TOC Transition Note (Signed)
Transition of Care [facility]) - CM/SW Discharge Note   Patient Details  Name: Erik Moss MRN: [id number] Date of Birth: Apr 23, 1933  Transition of Care [facility]) CM/SW Contact:  Carley Hammed, LCSW Phone Number: 09/14/2023, 10:02 AM   Clinical Narrative:    Pt to be transported to [place] by [facility]. Nurse to call report to [phone]. Rm [id number]   Final next level of care: Skilled Nursing Facility Barriers to Discharge: Barriers Resolved   Patient Goals and CMS Choice CMS Medicare.gov Compare Post Acute Care list provided to:: Patient Choice offered to / list presented to : Adult Children  Discharge Placement                Patient chooses bed at: [facility] Patient to be transferred to facility by: [facility] Name of family member notified: [name] Patient and family notified of of transfer: 09/14/23  Discharge Plan and Services Additional resources added to the After Visit Summary for                                       Social Determinants of Health (SDOH) Interventions SDOH Screenings   Food Insecurity: Patient Unable To Answer (09/12/2023)  Housing: Patient Unable To Answer (09/12/2023)  Transportation Needs: Patient Unable To Answer (09/12/2023)  Utilities: Patient Unable To Answer (09/12/2023)  Depression (PHQ2-9): Low Risk  (11/19/2019)  Tobacco Use: Low Risk  (08/23/2023)   Received from [facility]     Readmission Risk Interventions     No data to display

## 2023-09-14 NOTE — Plan of Care (Signed)

## 2023-09-14 NOTE — Discharge Summary (Signed)
Physician Discharge Summary  Erik Moss ZOX:[id number] DOB: Dec 02, 1932 DOA: 09/08/2023  PCP: Harvest Forest, MD  Admit date: 09/08/2023 Discharge date: 09/14/2023  Admitted From: Home Disposition: [facility]  Recommendations for Outpatient Follow-up:  Follow up with PCP in 1-2 weeks Continue to encourage increased oral intake, recommend outpatient palliative care to follow after discharge.  If continues to gradually decline would recommend consideration of hospice.   Discharge Condition: Stable; but overall long-term prognosis poor CODE STATUS: DNR Diet recommendation: Regular diet  History of present illness:  Erik Moss is a 89 y.o. male with past medical history significant for HTN, HLD, DM2, BPH, anemia of chronic medical disease, CKD stage IIIb, dementia, recent Covid-19 viral infection who presented to [facility] ED on 11/15 with progressive weakness, confusion and poor oral intake.  On presentation, she was found to have hypernatremia, AKI and acute metabolic encephalopathy.  MRI of brain was negative for acute abnormalities; UA was negative for UTI; chest x-ray did not show any pneumonia.  He was started on IV fluids.  Palliative care consulted for goals of care discussion.  PT recommended SNF placement.   Hospital course:  Acute metabolic encephalopathy superimposed on dementia Patient presenting to the ED with confusion, progressive weakness and poor oral intake.  Recent Cova-19 viral infection likely complicating factor.  On admission patient was noted to be hyponatremic with elevated creatinine likely secondary to dehydration.  MRI brain negative for acute abnormalities.  Urinalysis negative for UTI, chest x-ray negative for focal consolidation.  EEG was negative for seizures.  Ammonia level 15, B12 level within normal limits.  Etiology likely secondary to adult failure to thrive in the setting of poor oral intake with recent Covid-19 viral infection.  Improved  with IV fluid hydration. Memantine 10 mg p.o. twice daily, Seroquel 12.5 mg p.o. nightly.  Delirium/dementia precautions.  Recommend outpatient palliative care to follow on discharge.  If continues to decline would recommend transitioning to hospice care.   Hypernatremia: Resolved Sodium 150 on admission, likely hypovolemic hypernatremia in the setting of poor oral intake/dehydration.  Supported with IV fluid hydration with improvement of sodium to 137. Continue to encourage increased oral intake   AKI on CKD stage IIIb Creatinine 2.77 on admission, likely secondary to prerenal azotemia in the setting of severe dehydration from poor oral intake.  Baseline creatinine 1.7-2.2.  Creatinine improved to 2.26 at time of discharge.   Acute metabolic acidosis Sodium bicarbonate 650 mg p.o. twice daily   Anemia of chronic medical/renal disease hgb 10.1, stable.   Hypertension Hyperlipidemia Amlodipine 5 mg p.o. daily, Atorvastatin 40 mg p.o. daily   Diabetes mellitus type 2 Hemoglobin A1c 8.6 on 09/09/2023.  Home regimen includes Januvia.   BPH Tamsulosin 0.4 mg p.o. nightly   Goals of care Seen by palliative care during hospitalization.  DNR.  Recommend outpatient palliative care to follow on discharge with transition to hospice if continues gradual decline as long-term prognosis poor.  Discharge Diagnoses:  Principal Problem:   Hypernatremia Active Problems:   Hyperlipidemia   BPH (benign prostatic hyperplasia)   Dementia without behavioral disturbance, psychotic disturbance, mood disturbance, or anxiety [facility])    Discharge Instructions  Discharge Instructions     Call MD for:  difficulty breathing, headache or visual disturbances   Complete by: As directed    Call MD for:  extreme fatigue   Complete by: As directed    Call MD for:  persistant dizziness or light-headedness   Complete by: As directed  Call MD for:  persistant nausea and vomiting   Complete by: As directed     Call MD for:  severe uncontrolled pain   Complete by: As directed    Call MD for:  temperature >100.4   Complete by: As directed    Diet - low sodium heart healthy   Complete by: As directed    Increase activity slowly   Complete by: As directed       Allergies as of 09/14/2023   No Known Allergies      Medication List     TAKE these medications    amLODipine 5 MG tablet Commonly known as: NORVASC Take 5 mg by mouth daily.   atorvastatin 40 MG tablet Commonly known as: Lipitor Take 1 tablet (40 mg total) by mouth daily.   bisacodyl 5 MG EC tablet Commonly known as: DULCOLAX Take 5 mg by mouth daily as needed for moderate constipation or mild constipation.   blood glucose meter kit and supplies Dispense based on patient and insurance preference. Use up to four times daily as directed. (FOR ICD-10 E10.9, E11.9).   D3 PO Take 1 capsule by mouth daily.   diphenhydrAMINE 12.5 MG/5ML elixir Commonly known as: BENADRYL Take 6.25 mg by mouth 4 (four) times daily as needed for allergies.   folic acid 1 MG tablet Commonly known as: FOLVITE Take 1 tablet (1 mg total) by mouth daily. Start taking on: September 15, 2023   memantine 10 MG tablet Commonly known as: Namenda Take 1 tablet (10 mg total) by mouth 2 (two) times daily.   mirabegron ER 50 MG Tb24 tablet Commonly known as: MYRBETRIQ Take 50 mg by mouth daily.   QUEtiapine 25 MG tablet Commonly known as: SEROQUEL Take 0.5 tablets (12.5 mg total) by mouth at bedtime.   sitaGLIPtin 25 MG tablet Commonly known as: JANUVIA Take 25 mg by mouth daily.   sodium bicarbonate 650 MG tablet Take 650 mg by mouth 2 (two) times daily.   tamsulosin 0.4 MG Caps capsule Commonly known as: FLOMAX Take 0.4 mg by mouth at bedtime.        Contact information for follow-up providers     Bakare, Mobolaji B, MD. Schedule an appointment as soon as possible for a visit in 1 week(s).   Specialty: Internal Medicine Contact  information: [address] Raeanne Gathers [place] [address] [phone]              Contact information for after-discharge care     Destination     [facility] .   Service: Skilled Nursing Contact information: [address] [place] [address] [phone]                    No Known Allergies  Consultations: Palliative care   Procedures/Studies: EEG adult  Result Date: September 19, 2023 Charlsie Quest, MD     Sep 19, 2023 12:49 PM Patient Name: Amzie Jardine Castiglia MRN: [id number] Epilepsy Attending: Charlsie Quest Referring Physician/Provider: Maretta Bees, MD Date: 2023/09/19 Duration: 26.19 mins Patient history: 89yo M with ams getting eeg to evaluate for seizure. Level of alertness: Awake AEDs during EEG study: None Technical aspects: This EEG study was done with scalp electrodes positioned according to the 10-20 International system of electrode placement. Electrical activity was reviewed with band pass filter of 1-70Hz , sensitivity of 7 uV/mm, display speed of 102mm/sec with a 60Hz  notched filter applied as appropriate. EEG data were recorded continuously and digitally stored.  Video monitoring was available and reviewed as appropriate. Description: The posterior dominant rhythm consists of 7 Hz activity of moderate voltage (25-35 uV) seen predominantly in posterior head regions, symmetric and reactive to eye opening and eye closing. EEG showed continuous generalized 5 to 6 Hz theta slowing. Hyperventilation and photic stimulation were not performed.   Of note, study was technically difficult due to significant myogenic artifact. ABNORMALITY - Continuous slow, generalized IMPRESSION: This technically difficult study is suggestive of mild to moderate diffuse encephalopathy. No seizures or epileptiform discharges were seen throughout the recording. Charlsie Quest   MR BRAIN WO CONTRAST  Result Date: 09/08/2023 CLINICAL DATA:   Initial evaluation for altered mental status, difficulty walking. EXAM: MRI HEAD WITHOUT CONTRAST TECHNIQUE: Multiplanar, multiecho pulse sequences of the brain and surrounding structures were obtained without intravenous contrast. COMPARISON:  CT from 07/21/2023 and MRI from 08/23/2021. FINDINGS: Brain: Diffuse prominence of the CSF containing spaces compatible generalized cerebral atrophy, moderate in nature. Patchy T2/FLAIR hyperintensity involving the periventricular and deep white matter both cerebral hemispheres as well as the pons, consistent with chronic small vessel ischemic disease, mild-to-moderate in nature. No evidence for acute or subacute ischemia. Gray-white matter differentiation maintained. No acute intracranial hemorrhage. Small focus of chronic hemosiderin staining noted at the anterior right frontal lobe (series 7, image 67). No associated T1 or FLAIR signal intensity seen at this location to suggest acute blood products. No mass lesion, midline shift or mass effect. No hydrocephalus or extra-axial fluid collection. Partially empty sella noted. Vascular: Major intracranial vascular flow voids are maintained. Skull and upper cervical spine: Craniocervical junction within normal limits. Degenerative thickening about the tectorial membrane noted. Moderate spondylosis within the visualized upper cervical spine. Bone marrow signal intensity within normal limits. No scalp soft tissue abnormality. Sinuses/Orbits: Prior bilateral ocular lens replacement. Scattered mucosal thickening present throughout the frontoethmoidal and maxillary sinuses. No significant mastoid effusion. Other: None. IMPRESSION: 1. No acute intracranial abnormality. 2. Moderate cerebral atrophy with mild-to-moderate chronic microvascular ischemic disease. Electronically Signed   By: Rise Mu M.D.   On: 09/08/2023 22:07   DG Chest 2 View  Result Date: 09/08/2023 CLINICAL DATA:  Altered level of consciousness, tired  for 3 days EXAM: CHEST - 2 VIEW COMPARISON:  09/24/2020 FINDINGS: The heart size and mediastinal contours are within normal limits. Both lungs are clear. The visualized skeletal structures are unremarkable. IMPRESSION: No active cardiopulmonary disease. Electronically Signed   By: Sharlet Salina M.D.   On: 09/08/2023 20:25     Subjective: Patient seen examined bedside, resting company.  Lying in bed sleeping.  Easily arousable.  No specific complaints or concerns this morning.  Discharging to SNF today.  Overall prognosis is poor given his advanced age, dementia and overall progressive decline.  Denies headache, no chest pain, no palpitations, no shortness of breath, no abdominal pain, no fever, no cough/congestion.  No acute events overnight per nursing staff.  Discharge Exam: Vitals:   09/14/23 0348 09/14/23 0858  BP: 125/66 (!) 122/57  Pulse: 84 79  Resp: 18 17  Temp: 98.4 F (36.9 C) (!) 97.1 F (36.2 C)  SpO2: 100% 99%   Vitals:   09/14/23 0348 09/14/23 0406 09/14/23 0500 09/14/23 0858  BP: 125/66   (!) 122/57  Pulse: 84   79  Resp: 18   17  Temp: 98.4 F (36.9 C)   (!) 97.1 F (36.2 C)  TempSrc: Oral     SpO2: 100%   99%  Weight:  64.2  kg 61.6 kg   Height:        Physical Exam: GEN: NAD, alert, chronically ill appearance, appears older than stated age HEENT: NCAT, PERRL, EOMI, sclera clear, MMM PULM: CTAB w/o wheezes/crackles, normal respiratory effort, on room air CV: RRR w/o M/G/R GI: abd soft, NTND, NABS, no R/G/M MSK: no peripheral edema, muscle strength globally intact 5/5 bilateral upper/lower extremities NEURO: CN II-XII intact, no focal deficits, sensation to light touch intact PSYCH: normal mood/affect Integumentary: No concerning rashes/lesions/wounds noted exposed skin surfaces    The results of significant diagnostics from this hospitalization (including imaging, microbiology, ancillary and laboratory) are listed below for reference.      Microbiology: No results found for this or any previous visit (from the past 240 hour(s)).   Labs: BNP (last 3 results) No results for input(s): "BNP" in the last 8760 hours. Basic Metabolic Panel: Recent Labs  Lab 09/09/23 0432 09/10/23 0622 09/11/23 0830 09/12/23 0752 09/13/23 0743  NA 146* 138 137 137 137  K 4.0 4.3 4.2 4.0 5.2*  CL 112* 108 108 106 108  CO2 23 20* 23 23 19*  GLUCOSE 214* 136* 142* 194* 149*  BUN 40* 33* 29* 39* 44*  CREATININE 2.53* 2.22* 2.28* 2.33* 2.26*  CALCIUM 8.9 8.9 8.8* 8.8* 9.3  MG  --   --  2.1 2.0 1.7   Liver Function Tests: Recent Labs  Lab 09/11/23 0830  AST 15  ALT 14  ALKPHOS 81  BILITOT 0.8  PROT 6.0*  ALBUMIN 3.2*   No results for input(s): "LIPASE", "AMYLASE" in the last 168 hours. Recent Labs  Lab 09/08/23 1958 09/10/23 0622  AMMONIA 15 18   CBC: Recent Labs  Lab 09/09/23 0432 09/10/23 0622 09/11/23 0830 09/12/23 0752 09/13/23 0743  WBC 8.3 8.6 8.5 7.9 9.2  NEUTROABS 5.0  --  5.1 4.9 5.9  HGB 10.6* 10.3* 11.2* 10.6* 10.1*  HCT 34.1* 32.8* 35.4* 32.6* 31.6*  MCV 98.6 96.2 94.9 95.6 96.0  PLT 361 323 349 325 300   Cardiac Enzymes: No results for input(s): "CKTOTAL", "CKMB", "CKMBINDEX", "TROPONINI" in the last 168 hours. BNP: Invalid input(s): "POCBNP" CBG: Recent Labs  Lab 09/13/23 0834 09/13/23 1212 09/13/23 1527 09/13/23 2152 09/14/23 0855  GLUCAP 139* 196* 133* 95 128*   D-Dimer No results for input(s): "DDIMER" in the last 72 hours. Hgb A1c No results for input(s): "HGBA1C" in the last 72 hours. Lipid Profile No results for input(s): "CHOL", "HDL", "LDLCALC", "TRIG", "CHOLHDL", "LDLDIRECT" in the last 72 hours. Thyroid function studies No results for input(s): "TSH", "T4TOTAL", "T3FREE", "THYROIDAB" in the last 72 hours.  Invalid input(s): "FREET3" Anemia work up No results for input(s): "VITAMINB12", "FOLATE", "FERRITIN", "TIBC", "IRON", "RETICCTPCT" in the last 72 hours. Urinalysis     Component Value Date/Time   COLORURINE YELLOW 09/08/2023 2216   APPEARANCEUR CLEAR 09/08/2023 2216   LABSPEC 1.021 09/08/2023 2216   PHURINE 8.0 09/08/2023 2216   GLUCOSEU 50 (A) 09/08/2023 2216   HGBUR NEGATIVE 09/08/2023 2216   BILIRUBINUR NEGATIVE 09/08/2023 2216   KETONESUR NEGATIVE 09/08/2023 2216   PROTEINUR 30 (A) 09/08/2023 2216   NITRITE NEGATIVE 09/08/2023 2216   LEUKOCYTESUR NEGATIVE 09/08/2023 2216   Sepsis Labs Recent Labs  Lab 09/10/23 0622 09/11/23 0830 09/12/23 0752 09/13/23 0743  WBC 8.6 8.5 7.9 9.2   Microbiology No results found for this or any previous visit (from the past 240 hour(s)).   Time coordinating discharge: Over 30 minutes  SIGNED:   Alvira Philips [place],  DO  [facility] 09/14/2023, 9:55 AM

## 2023-09-14 NOTE — Progress Notes (Signed)
Attempted to call report to [place]. Receptionist transferred call but no one picked up.

## 2023-09-15 DIAGNOSIS — E1122 Type 2 diabetes mellitus with diabetic chronic kidney disease: Secondary | ICD-10-CM | POA: Diagnosis not present

## 2023-09-15 DIAGNOSIS — Z9189 Other specified personal risk factors, not elsewhere classified: Secondary | ICD-10-CM | POA: Diagnosis not present

## 2023-09-15 DIAGNOSIS — Z8616 Personal history of COVID-19: Secondary | ICD-10-CM | POA: Diagnosis not present

## 2023-09-15 DIAGNOSIS — I152 Hypertension secondary to endocrine disorders: Secondary | ICD-10-CM | POA: Diagnosis not present

## 2023-09-15 DIAGNOSIS — N400 Enlarged prostate without lower urinary tract symptoms: Secondary | ICD-10-CM | POA: Diagnosis not present

## 2023-09-15 DIAGNOSIS — R627 Adult failure to thrive: Secondary | ICD-10-CM | POA: Diagnosis not present

## 2023-09-15 DIAGNOSIS — R5381 Other malaise: Secondary | ICD-10-CM | POA: Diagnosis not present

## 2023-09-15 DIAGNOSIS — K5901 Slow transit constipation: Secondary | ICD-10-CM | POA: Diagnosis not present

## 2023-09-15 DIAGNOSIS — E785 Hyperlipidemia, unspecified: Secondary | ICD-10-CM | POA: Diagnosis not present

## 2023-09-15 DIAGNOSIS — I679 Cerebrovascular disease, unspecified: Secondary | ICD-10-CM | POA: Diagnosis not present

## 2023-09-15 DIAGNOSIS — E1159 Type 2 diabetes mellitus with other circulatory complications: Secondary | ICD-10-CM | POA: Diagnosis not present

## 2023-09-15 DIAGNOSIS — F0150 Vascular dementia without behavioral disturbance: Secondary | ICD-10-CM | POA: Diagnosis not present

## 2023-09-20 DIAGNOSIS — F0150 Vascular dementia without behavioral disturbance: Secondary | ICD-10-CM | POA: Diagnosis not present

## 2023-09-20 DIAGNOSIS — N400 Enlarged prostate without lower urinary tract symptoms: Secondary | ICD-10-CM | POA: Diagnosis not present

## 2023-09-20 DIAGNOSIS — Z8673 Personal history of transient ischemic attack (TIA), and cerebral infarction without residual deficits: Secondary | ICD-10-CM | POA: Diagnosis not present

## 2023-09-27 ENCOUNTER — Ambulatory Visit: Payer: Medicare HMO | Admitting: Adult Health

## 2023-09-27 DIAGNOSIS — N1832 Chronic kidney disease, stage 3b: Secondary | ICD-10-CM | POA: Diagnosis not present

## 2023-09-27 DIAGNOSIS — Z8673 Personal history of transient ischemic attack (TIA), and cerebral infarction without residual deficits: Secondary | ICD-10-CM | POA: Diagnosis not present

## 2023-09-27 DIAGNOSIS — N400 Enlarged prostate without lower urinary tract symptoms: Secondary | ICD-10-CM | POA: Diagnosis not present

## 2023-09-27 DIAGNOSIS — F0150 Vascular dementia without behavioral disturbance: Secondary | ICD-10-CM | POA: Diagnosis not present

## 2023-09-27 DIAGNOSIS — E1122 Type 2 diabetes mellitus with diabetic chronic kidney disease: Secondary | ICD-10-CM | POA: Diagnosis not present

## 2023-09-27 DIAGNOSIS — R5381 Other malaise: Secondary | ICD-10-CM | POA: Diagnosis not present

## 2023-09-27 DIAGNOSIS — I152 Hypertension secondary to endocrine disorders: Secondary | ICD-10-CM | POA: Diagnosis not present

## 2023-09-27 DIAGNOSIS — Z7984 Long term (current) use of oral hypoglycemic drugs: Secondary | ICD-10-CM | POA: Diagnosis not present

## 2023-10-04 DIAGNOSIS — N281 Cyst of kidney, acquired: Secondary | ICD-10-CM | POA: Diagnosis not present

## 2023-10-04 DIAGNOSIS — E872 Acidosis, unspecified: Secondary | ICD-10-CM | POA: Diagnosis not present

## 2023-10-04 DIAGNOSIS — N400 Enlarged prostate without lower urinary tract symptoms: Secondary | ICD-10-CM | POA: Diagnosis not present

## 2023-10-04 DIAGNOSIS — I129 Hypertensive chronic kidney disease with stage 1 through stage 4 chronic kidney disease, or unspecified chronic kidney disease: Secondary | ICD-10-CM | POA: Diagnosis not present

## 2023-10-04 DIAGNOSIS — E1122 Type 2 diabetes mellitus with diabetic chronic kidney disease: Secondary | ICD-10-CM | POA: Diagnosis not present

## 2023-10-04 DIAGNOSIS — E875 Hyperkalemia: Secondary | ICD-10-CM | POA: Diagnosis not present

## 2023-10-04 DIAGNOSIS — N184 Chronic kidney disease, stage 4 (severe): Secondary | ICD-10-CM | POA: Diagnosis not present

## 2023-10-04 DIAGNOSIS — D631 Anemia in chronic kidney disease: Secondary | ICD-10-CM | POA: Diagnosis not present

## 2023-10-04 DIAGNOSIS — F0390 Unspecified dementia without behavioral disturbance: Secondary | ICD-10-CM | POA: Diagnosis not present

## 2023-10-05 DIAGNOSIS — N184 Chronic kidney disease, stage 4 (severe): Secondary | ICD-10-CM | POA: Diagnosis not present

## 2023-10-06 ENCOUNTER — Emergency Department (HOSPITAL_COMMUNITY)
Admission: EM | Admit: 2023-10-06 | Discharge: 2023-10-07 | Disposition: A | Discharge status: Home / Self Care | Payer: Medicare HMO | Attending: Emergency Medicine | Admitting: Emergency Medicine

## 2023-10-06 ENCOUNTER — Encounter (HOSPITAL_COMMUNITY): Payer: Self-pay

## 2023-10-06 ENCOUNTER — Other Ambulatory Visit: Payer: Self-pay

## 2023-10-06 DIAGNOSIS — N3090 Cystitis, unspecified without hematuria: Secondary | ICD-10-CM | POA: Diagnosis not present

## 2023-10-06 DIAGNOSIS — E1122 Type 2 diabetes mellitus with diabetic chronic kidney disease: Secondary | ICD-10-CM | POA: Diagnosis not present

## 2023-10-06 DIAGNOSIS — N189 Chronic kidney disease, unspecified: Secondary | ICD-10-CM | POA: Diagnosis not present

## 2023-10-06 DIAGNOSIS — Z79899 Other long term (current) drug therapy: Secondary | ICD-10-CM | POA: Diagnosis not present

## 2023-10-06 DIAGNOSIS — E871 Hypo-osmolality and hyponatremia: Secondary | ICD-10-CM | POA: Diagnosis not present

## 2023-10-06 DIAGNOSIS — I129 Hypertensive chronic kidney disease with stage 1 through stage 4 chronic kidney disease, or unspecified chronic kidney disease: Secondary | ICD-10-CM | POA: Insufficient documentation

## 2023-10-06 DIAGNOSIS — R300 Dysuria: Secondary | ICD-10-CM | POA: Diagnosis not present

## 2023-10-06 LAB — BRAIN NATRIURETIC PEPTIDE: B Natriuretic Peptide: 268.3 pg/mL — ABNORMAL HIGH (ref 0.0–100.0)

## 2023-10-06 LAB — COMPREHENSIVE METABOLIC PANEL
ALT: 15 U/L (ref 0–44)
AST: 13 U/L — ABNORMAL LOW (ref 15–41)
Albumin: 3.3 g/dL — ABNORMAL LOW (ref 3.5–5.0)
Alkaline Phosphatase: 66 U/L (ref 38–126)
Anion gap: 7 (ref 5–15)
BUN: 36 mg/dL — ABNORMAL HIGH (ref 8–23)
CO2: 19 mmol/L — ABNORMAL LOW (ref 22–32)
Calcium: 8.1 mg/dL — ABNORMAL LOW (ref 8.9–10.3)
Chloride: 103 mmol/L (ref 98–111)
Creatinine, Ser: 2.31 mg/dL — ABNORMAL HIGH (ref 0.61–1.24)
GFR, Estimated: 27 mL/min — ABNORMAL LOW (ref >60)
Glucose, Bld: 200 mg/dL — ABNORMAL HIGH (ref 70–99)
Potassium: 3.7 mmol/L (ref 3.5–5.1)
Sodium: 129 mmol/L — ABNORMAL LOW (ref 135–145)
Total Bilirubin: 0.3 mg/dL (ref <1.2)
Total Protein: 5.9 g/dL — ABNORMAL LOW (ref 6.5–8.1)

## 2023-10-06 LAB — CBC WITH DIFFERENTIAL/PLATELET
Abs Immature Granulocytes: 0.02 10*3/uL (ref 0.00–0.07)
Basophils Absolute: 0 10*3/uL (ref 0.0–0.1)
Basophils Relative: 0 %
Eosinophils Absolute: 0.2 10*3/uL (ref 0.0–0.5)
Eosinophils Relative: 2 %
HCT: 25.8 % — ABNORMAL LOW (ref 39.0–52.0)
Hemoglobin: 8.5 g/dL — ABNORMAL LOW (ref 13.0–17.0)
Immature Granulocytes: 0 %
Lymphocytes Relative: 30 %
Lymphs Abs: 1.9 10*3/uL (ref 0.7–4.0)
MCH: 31.5 pg (ref 26.0–34.0)
MCHC: 32.9 g/dL (ref 30.0–36.0)
MCV: 95.6 fL (ref 80.0–100.0)
Monocytes Absolute: 0.4 10*3/uL (ref 0.1–1.0)
Monocytes Relative: 6 %
Neutro Abs: 3.9 10*3/uL (ref 1.7–7.7)
Neutrophils Relative %: 62 %
Platelets: 292 10*3/uL (ref 150–400)
RBC: 2.7 MIL/uL — ABNORMAL LOW (ref 4.22–5.81)
RDW: 15.3 % (ref 11.5–15.5)
WBC: 6.4 10*3/uL (ref 4.0–10.5)
nRBC: 0 % (ref 0.0–0.2)

## 2023-10-06 LAB — TROPONIN I (HIGH SENSITIVITY): Troponin I (High Sensitivity): 9 ng/L (ref <18)

## 2023-10-06 NOTE — ED Triage Notes (Signed)
C/o burning with urination, frequency, and hesitancy for "few days".  Also c/o bilateral lower extremity swelling.  Last urinated 1 hr pta

## 2023-10-06 NOTE — ED Provider Triage Note (Signed)
Emergency Medicine Provider Triage Evaluation Note  Erik Moss , a 89 y.o. male  was evaluated in triage.  Pt complains of difficulty urinating and painful urination for the past few weeks.  He does not have known prostate issues.  Family member also concerned that his bilateral feet and ankles are swollen.  Unknown how long they have been this way.  Patient reports he has been peeing more frequently and it burns.  Urinated 1 hour ago.  Review of Systems  Positive: As above Negative: As above  Physical Exam  There were no vitals taken for this visit. Gen:   Awake, no distress   Resp:  Normal effort  MSK:   Moves extremities without difficulty  Other:    Medical Decision Making  Medically screening exam initiated at 8:23 PM.  Appropriate orders placed.  Priyan Kippen Amy was informed that the remainder of the evaluation will be completed by another provider, this initial triage assessment does not replace that evaluation, and the importance of remaining in the ED until their evaluation is complete.     Lenard Simmer, [place] 10/06/23 2024

## 2023-10-07 LAB — URINALYSIS, W/ REFLEX TO CULTURE (INFECTION SUSPECTED)
Bilirubin Urine: NEGATIVE
Glucose, UA: NEGATIVE mg/dL
Ketones, ur: NEGATIVE mg/dL
Nitrite: NEGATIVE
Protein, ur: NEGATIVE mg/dL
Specific Gravity, Urine: 1.005 (ref 1.005–1.030)
WBC, UA: >50 WBC/hpf (ref 0–5)
pH: 6 (ref 5.0–8.0)

## 2023-10-07 MED ORDER — CEPHALEXIN 500 MG PO CAPS: 500.0000 mg | ORAL_CAPSULE | Freq: Three times a day (TID) | ORAL | 0 refills | Status: AC

## 2023-10-07 MED ORDER — SODIUM CHLORIDE 0.9 % IV SOLN
1.0000 g | Freq: Once | INTRAVENOUS | Status: AC
  Administered 2023-10-07: 1 g via INTRAVENOUS
  Filled 2023-10-07: qty 10

## 2023-10-07 NOTE — ED Notes (Signed)
Postvoid residual 156m

## 2023-10-07 NOTE — ED Notes (Signed)
Contacted daughter, who stated ED arrival in 15-20 minutes for pickup of patient

## 2023-10-07 NOTE — ED Provider Notes (Signed)
[place] EMERGENCY DEPARTMENT AT [facility] Provider Note   CSN: [id number] Arrival date & time: 10/06/23  1958     History  Chief Complaint  Patient presents with   Dysuria    Erik Moss is a 89 y.o. male.   Dysuria Presenting symptoms: dysuria   89 year old male history of type 2 diabetes, hypertension, hyperlipidemia, BPH, CKD presenting for difficulty with urination.  Patient is here with his daughter.  He states that for last few days he has had some dysuria and difficulty urinating.  He takes Flomax and states he has not missed any doses.  Still feels like he needs to urinate.  No abdominal pain or flank pain.  No fevers or chills.  No blood in his urine as far as he is aware.  He was denies having this issue before but does have history of BPH noted in his chart.  He was recently admitted for hyponatremia, AKI, and metabolic encephalopathy.  He is otherwise been doing well.  They do note he has had some swelling in both legs/ankles.  He felt today like it was slightly worse.  Not asymmetric.  No history of DVT or PE.  No chest pain or shortness of breath.  No skin changes.     Home Medications Prior to Admission medications   Medication Sig Start Date End Date Taking? Authorizing Provider  cephALEXin (KEFLEX) 500 MG capsule Take 1 capsule (500 mg total) by mouth 3 (three) times daily for 7 days. 10/07/23 10/14/23 Yes Laurence Spates, MD  amLODipine (NORVASC) 5 MG tablet Take 5 mg by mouth daily.    [provider]  atorvastatin (LIPITOR) 40 MG tablet Take 1 tablet (40 mg total) by mouth daily. 10/10/19 09/08/23  [place], Alvira Philips, DO  bisacodyl (DULCOLAX) 5 MG EC tablet Take 5 mg by mouth daily as needed for moderate constipation or mild constipation.    [provider]  blood glucose meter kit and supplies Dispense based on patient and insurance preference. Use up to four times daily as directed. (FOR ICD-10 E10.9, E11.9). 09/25/20   Lanae Boast, MD  Cholecalciferol (D3 PO) Take 1 capsule by mouth daily.    [provider]  diphenhydrAMINE (BENADRYL) 12.5 MG/5ML elixir Take 6.25 mg by mouth 4 (four) times daily as needed for allergies.    [provider]  folic acid (FOLVITE) 1 MG tablet Take 1 tablet (1 mg total) by mouth daily. 09/15/23   [place], [name], DO  memantine (NAMENDA) 10 MG tablet Take 1 tablet (10 mg total) by mouth 2 (two) times daily. 12/05/22   Ihor Austin, NP  mirabegron ER (MYRBETRIQ) 50 MG TB24 tablet Take 50 mg by mouth daily.    [provider]  QUEtiapine (SEROQUEL) 25 MG tablet Take 0.5 tablets (12.5 mg total) by mouth at bedtime. 09/14/23 10/14/23  [place], [name], DO  sitaGLIPtin (JANUVIA) 25 MG tablet Take 25 mg by mouth daily.    [provider]  sodium bicarbonate 650 MG tablet Take 650 mg by mouth 2 (two) times daily.    [provider]  tamsulosin (FLOMAX) 0.4 MG CAPS capsule Take 0.4 mg by mouth at bedtime. 08/02/23   [provider]      Allergies    Patient has no known allergies.    Review of Systems   Review of Systems  Genitourinary:  Positive for dysuria.  Review of systems completed and notable as per HPI.  ROS otherwise  negative.   Physical Exam Updated Vital Signs BP (!) 146/75   Pulse 82   Temp 97.7 F (36.5 C) (Oral)   Resp 12   Ht 5\' 6"  (1.676 m)   Wt 61 kg   SpO2 100%   BMI 21.71 kg/m  Physical Exam Vitals and nursing note reviewed.  Constitutional:      General: He is not in acute distress.    Appearance: He is well-developed.  HENT:     Head: Normocephalic and atraumatic.     Nose: Nose normal.     Mouth/Throat:     Mouth: Mucous membranes are moist.     Pharynx: Oropharynx is clear.  Eyes:     Extraocular Movements: Extraocular movements intact.     Conjunctiva/sclera: Conjunctivae normal.     Pupils: Pupils are equal, round, and reactive to light.  Cardiovascular:     Rate and Rhythm: Normal rate and  regular rhythm.     Pulses: Normal pulses.     Heart sounds: Normal heart sounds. No murmur heard. Pulmonary:     Effort: Pulmonary effort is normal. No respiratory distress.     Breath sounds: Normal breath sounds.  Abdominal:     Palpations: Abdomen is soft.     Tenderness: There is no abdominal tenderness. There is no guarding or rebound.  Musculoskeletal:        General: No swelling.     Cervical back: Neck supple.     Right lower leg: No edema.     Left lower leg: No edema.     Comments: Patient has some dry skin to both legs I do not appreciate any swelling or pitting edema.  He is no tenderness along the calves.  No erythema, warmth.  Palpable DP and PT pulses.  Skin:    General: Skin is warm and dry.     Capillary Refill: Capillary refill takes less than 2 seconds.  Neurological:     Mental Status: He is alert.  Psychiatric:        Mood and Affect: Mood normal.     ED Results / Procedures / Treatments   Labs (all labs ordered are listed, but only abnormal results are displayed) Labs Reviewed  URINALYSIS, W/ REFLEX TO CULTURE (INFECTION SUSPECTED) - Abnormal; Notable for the following components:      Result Value   APPearance CLOUDY (*)    Hgb urine dipstick SMALL (*)    Leukocytes,Ua LARGE (*)    Bacteria, UA RARE (*)    All other components within normal limits  BRAIN NATRIURETIC PEPTIDE - Abnormal; Notable for the following components:   B Natriuretic Peptide 268.3 (*)    All other components within normal limits  COMPREHENSIVE METABOLIC PANEL - Abnormal; Notable for the following components:   Sodium 129 (*)    CO2 19 (*)    Glucose, Bld 200 (*)    BUN 36 (*)    Creatinine, Ser 2.31 (*)    Calcium 8.1 (*)    Total Protein 5.9 (*)    Albumin 3.3 (*)    AST 13 (*)    GFR, Estimated 27 (*)    All other components within normal limits  CBC WITH DIFFERENTIAL/PLATELET - Abnormal; Notable for the following components:   RBC 2.70 (*)    Hemoglobin 8.5 (*)     HCT 25.8 (*)    All other components within normal limits  URINE CULTURE  TROPONIN I (HIGH SENSITIVITY)    EKG None  Radiology No results found.  Procedures Procedures    Medications Ordered in ED Medications  cefTRIAXone (ROCEPHIN) 1 g in sodium chloride 0.9 % 100 mL IVPB (0 g Intravenous Stopped 10/07/23 0543)    ED Course/ Medical Decision Making/ A&P                                 Medical Decision Making Risk Prescription drug management.   Medical Decision Making:   Erik Moss is a 89 y.o. male who presented to the ED today with difficulty urinating and dysuria.  Vital signs reviewed and unremarkable.  On exam he is well-appearing.  Reports 2 days of dysuria, difficulty urinating.  Will obtain bladder scan, postvoid here and see if he needs treatment for retention.  He is on Flomax.  He is recently mated for hypernatremia.  Labs here notable for mild hyponatremia to 129.  He has chronic metabolic acidosis which is unchanged.  Renal function is at his baseline.  Electrolytes otherwise look okay.  His anemia has slightly worsened 8.5 from 10.  No signs or symptoms of GI bleeding.  His BNP was measured in triage as was his troponin.  He does report some lower extremity edema but on my exam I do not appreciate any edema including no pitting edema and he has no clinical signs or symptoms of heart failure despite mildly elevated BNP.  Troponin is normal.  No chest pain.  He has no some asymmetric edema and really no appreciable edema at all and no leg pain, I have low suspicion for DVT.   Patient placed on continuous vitals and telemetry monitoring while in ED which was reviewed periodically.  Reviewed and confirmed nursing documentation for past medical history, family history, social history.  Reassessment and Plan:   Patient remained stable.  His urinalysis concerning for infection.  He was able to urinate here without difficulty.  Postvoid residual is less than 100,  I do not think he needs a Foley catheter.  Expected symptoms related to UTI.  He was given Rocephin here, will discharge with course of Keflex.  I talked with his daughter over the phone who is coming to get him.  I discussed the abnormal lab finding including the sodium, hemoglobin, BMP.  I discussed that I recommended he have these rechecked in the next 2 to 3 days, gave strict return precautions for any new or worsening symptoms.  Patient patient and daughter were comfortable this plan.   Patient's presentation is most consistent with acute complicated illness / injury requiring diagnostic workup.           Final Clinical Impression(s) / ED Diagnoses Final diagnoses:  Cystitis    Rx / DC Orders ED Discharge Orders          Ordered    cephALEXin (KEFLEX) 500 MG capsule  3 times daily        10/07/23 0539              Laurence Spates, MD 10/07/23 [phone]

## 2023-10-07 NOTE — Discharge Instructions (Addendum)
You were seen today for difficulty urinating.  Urinalysis concerning for urinary tract infection.  Your sodium was also slightly low and your hemoglobin was slightly lower than prior.  You need to follow-up with your doctor in the next 2 to 3 days to have your lab work rechecked.  If you develop any difficulty urinating, fever, severe pain or any other new concerning symptoms you should return to the ED.

## 2023-10-08 LAB — URINE CULTURE

## 2023-11-09 ENCOUNTER — Other Ambulatory Visit: Payer: Self-pay | Admitting: Adult Health

## 2023-11-09 NOTE — Telephone Encounter (Signed)
Last seen on 10/05/23 Follow up scheduled on 12/04/23

## 2023-11-23 NOTE — Progress Notes (Deleted)
 [name] [address] [place]. [place] [address] [phone]       OFFICE FOLLOW UP NOTE  Erik Moss Date of Birth:  [birth year] Medical Record Number:  [id number]   Reason for visit: hx of stroke, dementia, imbalance    SUBJECTIVE:   CHIEF COMPLAINT:  No chief complaint on file.   HPI:   Update 12/04/2023 JM: Patient returns for follow-up visit accompanied by his daughter.  He was seen in the ED back in November with progressive weakness, confusion and poor oral intake, he was found to have hypernatremia, AKI and acute metabolic encephalopathy.  Felt recent COVID infection likely contributing factor.  MRI brain negative.  EEG negative.  AKI and hypernatremia improved prior to discharge.  Seen by palliative care, recommended outpatient palliative care follow-up with transition to hospice if continues to decline as long-term prognosis poor.  He was discharged to [facility].  Overall has been doing well since that time.  Denies any new stroke/TIA symptoms.  Reports cognition ***.  Remains on memantine and quetiapine.  No behavioral concerns. MMSE ***/30 (prior 19/30).  Compliant on Plavix and atorvastatin.  Routinely follows with PCP for stroke risk factor management.      History provided for reference purposes only Update 12/05/2022 JM: Patient returns for 100-month follow-up accompanied by his daughter.  Stable from stroke standpoint, no new stroke/TIA symptoms.  Remains on Plavix and atorvastatin.  Blood pressure today 130/67.  Routinely follows with PCP.  Cognition has been stable since prior visit, continues to go to [facility] daily and actively participates in social activities there. 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  MMSE today 19/30 (prior [facility] 12/30 - unable to complete top portion therefore switched to MMSE) Remains on Namenda 10mg  BID, denies side effects  Continued gait impairment/imbalance but overall stable since  prior visit.  Has been participating in dancing and exercise classes at [facility] which has been helpful. Was not seen by neurosurgery as previously discussed as stable.   UPDATE 05/26/2022 JM:   He is accompanied today by his daughter, follow-up.  Overall stable since prior visit without any new stroke/TIA symptoms.  Compliant on Plavix and atorvastatin, denies side effects.  Also reports continuing on aspirin 81 mg daily for stroke prevention purposes Blood pressure 142/77 - does not routinely monitor at home Routinely follows with PCP for stroke risk factor management with recent visit yesterday  Both patient and daughter report cognition has been stable since prior visit Remains on Namenda 10 mg twice daily, denies side effects. Per daughter, was never on Aricept previously  MOCA 12/30 (prior 18/30 11/2021)  MRI of cervical spine showed C4-5 spinal stenosis with possible nerve root compression on the left. Daughter believes his gait continues to gradually decline.  He does try to stay active and ambulates around his yard.  Does not use any assistive device, denies any recent falls.  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  Denies any hand or feet numbness/tingling.  Occasional bowel and bladder incontinence, unchanged since prior visit.  Denies any neck pain.  No further concerns at this time    MRI CERVICAL SPINE 12/11/2021 IMPRESSION: This MRI of the cervical spine without contrast shows the following: 1.   Abnormal signal within the spinal cord at C4 to C4-C5 involving the anterior and posterior horns of the spinal cord and small T2  hyperintense foci to the right and posterolaterally to the right adjacent to C5.  These are consistent with myelopathic signal changes, likely related to the spinal stenosis and anterolisthesis at C4-C5.  Additionally at this level there is  moderately severe left foraminal narrowing with potential for left C5 nerve root compression. 2.   At C3-C4, there are degenerative changes causing mild spinal stenosis and moderate bilateral foraminal narrowing but no definite nerve root compression. 3.   At C5-C6, there are degenerative changes causing mild spinal stenosis and moderately severe left foraminal narrowing and moderate right foraminal narrowing.  There is potential for left C6 nerve root compression. 4.   At C6-C7, there are degenerative changes causing moderate spinal stenosis and moderate bilateral foraminal narrowing but no definite nerve root compression. 5.   At C7-T1, there is anterolisthesis and other degenerative changes causing mild spinal stenosis and moderate bilateral foraminal narrowing but no definite nerve root compression. 6.   Degenerative changes at C2-C3 and T1-T2 do not lead to spinal stenosis.  There is foraminal narrowing but no definite nerve root compression.   UPDATE Nov 30 2021 Dr. Terrace Moss: He lives with his daughter now, still likes to read newspaper, also exercise regularly, his memory continues to decline, MoCA examination is 18/30 today, daughter also reported that he has increased gait abnormality, occasionally bowel and bladder incontinence.   Personally reviewed MRI of brain on Aug 23 2021:1. No evidence of acute intracranial abnormality, including acute infarct.2. Similar mild-to-moderate chronic microvascular disease and remote lacunar infarcts. He is tolerating Aricept 10 mg daily Laboratory evaluations October 2022: CBC showed hemoglobin of 11.4, CMP, creatinine 2.78, negative alcohol, negative UDS    Consult visit 02/23/2021 Dr. Terrace Moss: Said Erik Moss is a 89 year old male, seen in request by his PCP Erik Pilon, NP for evaluation of transient confusion with initial evaluation by Dr. Terrace Moss on 02/23/2021.   I reviewed and summarized the referring note.  Past medical  history Hypertension Hyperlipidemia Diabetes Prostate hypertrophy    He is retired [employer], he retired at age 77s, since then, he completed total of 9 masters degree, involving museum, [place] history, creative writing English, Comptroller, international relationship management,   Prior to 2020, he lives alone, moved to his daughter during pandemic, was noted to have slow worsening memory loss,    He was admitted to hospital in December 2020, was noted to have acute onset left-sided weakness, dragging his left leg while walking   MRI of the brain on October 08, 2019 showed small region of acute infarction involving right lateral thalamus, posterior limb of internal capsule, was also noted to have poorly controlled diabetes, A1c up to 10.5,   Extensive evaluation showed no significant large vessel disease, no significant abnormality on echocardiogram,   He was started on Plavix 75 mg daily, also taking aspirin 81 mg daily, daughter began to help in managing his medications   He recovered almost back to his baseline, he wished to register for college class, but was denied, he was noted to have worsening memory loss, he still enjoying reading newspaper, walk few miles each day,   His mother lived to more than 26 years old, began to develop mild memory loss in her 19s   In January 26, 2021, woke up in the morning, he was noted by his daughter to be slow reaction, mildly slurred speech, confused, was taken to the emergency room, MRI of the brain showed no acute abnormality  ROS:   14 system review of systems performed and negative with exception of those listed in HPI  PMH:  Past Medical History:  Diagnosis Date   BPH (benign prostatic hyperplasia) 10/08/2019   Diabetes mellitus without complication (HCC)    Hyperlipidemia 10/08/2019   Hypertension 10/08/2019   Renal disease    Stroke [facility])    TIA (transient ischemic attack)    Type 2 diabetes  mellitus (HCC) 10/08/2019    PSH:  Past Surgical History:  Procedure Laterality Date   BUBBLE STUDY  10/10/2019   Procedure: BUBBLE STUDY;  Surgeon: Sande Rives, MD;  Location: [facility] ENDOSCOPY;  Service: Cardiology;;   TEE WITHOUT CARDIOVERSION N/A 10/10/2019   Procedure: TRANSESOPHAGEAL ECHOCARDIOGRAM (TEE);  Surgeon: Sande Rives, MD;  Location: [facility] ENDOSCOPY;  Service: Cardiology;  Laterality: N/A;    Social History:  Social History   Socioeconomic History   Marital status: Divorced    Spouse name: Not on file   Number of children: 1   Years of education: collge   Highest education level: Master's degree (e.g., MA, MS, MEng, MEd, MSW, MBA)  Occupational History   Occupation: Retired  Tobacco Use   Smoking status: Never   Smokeless tobacco: Never  Substance and Sexual Activity   Alcohol use: Never   Drug use: Never   Sexual activity: Not on file  Other Topics Concern   Not on file  Social History Narrative   11/19/19 lives with dgtr, grand dgtr.   Left-handed.   Reports having nine masters' degrees.   1 cup tea each morning.   Social Drivers of Corporate investment banker Strain: Not on file  Food Insecurity: Patient Unable To Answer (09/12/2023)   Hunger Vital Sign    Worried About Running Out of Food in the Last Year: Patient unable to answer    Ran Out of Food in the Last Year: Patient unable to answer  Transportation Needs: Patient Unable To Answer (09/12/2023)   PRAPARE - Transportation    Lack of Transportation (Medical): Patient unable to answer    Lack of Transportation (Non-Medical): Patient unable to answer  Physical Activity: Not on file  Stress: Not on file  Social Connections: Not on file  Intimate Partner Violence: Patient Unable To Answer (09/12/2023)   Humiliation, Afraid, Rape, and Kick questionnaire    Fear of Current or Ex-Partner: Patient unable to answer    Emotionally Abused: Patient unable to answer    Physically Abused:  Patient unable to answer    Sexually Abused: Patient unable to answer    Family History:  Family History  Problem Relation Age of Onset   Other Mother        Passed from "old age"   Other Father        Accident    Medications:   Current Outpatient Medications on File Prior to Visit  Medication Sig Dispense Refill   amLODipine (NORVASC) 5 MG tablet Take 5 mg by mouth daily.     atorvastatin (LIPITOR) 40 MG tablet Take 1 tablet (40 mg total) by mouth daily. 90 tablet 0   bisacodyl (DULCOLAX) 5 MG EC tablet Take 5 mg by mouth daily as needed for moderate constipation or mild constipation.     blood glucose meter kit and supplies Dispense based on patient and insurance preference. Use up to four times daily as directed. (FOR ICD-10 E10.9, E11.9). 1 each 0   Cholecalciferol (D3 PO) Take 1 capsule by mouth daily.  diphenhydrAMINE (BENADRYL) 12.5 MG/5ML elixir Take 6.25 mg by mouth 4 (four) times daily as needed for allergies.     folic acid (FOLVITE) 1 MG tablet Take 1 tablet (1 mg total) by mouth daily.     memantine (NAMENDA) 10 MG tablet TAKE ONE TABLET BY MOUTH TWICE DAILY 180 tablet 0   mirabegron ER (MYRBETRIQ) 50 MG TB24 tablet Take 50 mg by mouth daily.     QUEtiapine (SEROQUEL) 25 MG tablet Take 0.5 tablets (12.5 mg total) by mouth at bedtime. 15 tablet 0   sitaGLIPtin (JANUVIA) 25 MG tablet Take 25 mg by mouth daily.     sodium bicarbonate 650 MG tablet Take 650 mg by mouth 2 (two) times daily.     tamsulosin (FLOMAX) 0.4 MG CAPS capsule Take 0.4 mg by mouth at bedtime.     No current facility-administered medications on file prior to visit.    Allergies:  No Known Allergies    OBJECTIVE:  Physical Exam  There were no vitals filed for this visit.   There is no height or weight on file to calculate BMI. No results found.   General: well developed, well nourished, frail very pleasant elderly African-American male, seated, in no evident distress Head: head  normocephalic and atraumatic.   Neck: supple with no carotid or supraclavicular bruits Cardiovascular: regular rate and rhythm, no murmurs Musculoskeletal: no deformity Skin:  no rash/petichiae Vascular:  Normal pulses all extremities   Neurologic Exam Mental Status: Awake and fully alert.  Fluent speech and language.  Oriented to place and time. Recent and remote memory intact. Attention span, concentration and fund of knowledge appropriate. Mood and affect appropriate.  Cranial Nerves: Pupils equal, briskly reactive to light. Extraocular movements full without nystagmus. Visual fields full to confrontation.  HOH bilaterally. Facial sensation intact. Face, tongue, palate moves normally and symmetrically.  Motor: Normal bulk and tone. Normal strength in all tested extremity muscles Sensory.: intact to touch , pinprick , position and vibratory sensation.  Coordination: Rapid alternating movements normal in all extremities. Finger-to-nose and heel-to-shin performed accurately bilaterally. Gait and Station: Arises from chair without difficulty. Stance is stooped. Gait demonstrates decreased stride length and step height bilaterally with mild unsteadiness and occasional veering towards the right.  No assistive device. Tandem walk and heel toe not attempted Reflexes: 1+ and symmetric. Toes downgoing.      05/31/2022    7:37 AM 11/30/2021    7:30 AM 02/25/2021    9:00 PM  Montreal Cognitive Assessment   Visuospatial/ Executive (0/5) 0 3 1  Naming (0/3) 0 1 3  Attention: Read list of digits (0/2) 2 2 2   Attention: Read list of letters (0/1) 1 1 1   Attention: Serial 7 subtraction starting at 100 (0/3) 2 3 3   Language: Repeat phrase (0/2) 1 1 2   Language : Fluency (0/1) 1 1 1   Abstraction (0/2) 1 1 2   Delayed Recall (0/5) 0 0 0  Orientation (0/6) 4 5 6   Total 12 18 21   Adjusted Score (based on education)  18       12/05/2022   10:54 AM  MMSE - Mini Mental State Exam  Orientation to time 5   Orientation to Place 3  Registration 3  Attention/ Calculation 1  Recall 0  Language- name 2 objects 2  Language- repeat 1  Language- follow 3 step command 2  Language- read & follow direction 1  Write a sentence 1  Copy design 0  Total score 19  ASSESSMENT/PLAN: Erik Moss is a 89 y.o. year old male    History of stroke involving right posterior limb of internal capsule in December 2020             Vascular risk factor of aging, hypertension, hyperlipidemia, diabetes  Continue Plavix and atorvastatin for secondary stroke prevention measures  Continue close PCP follow-up for aggressive stroke risk factor management        Dementia             MMSE ***/30 (prior 19/30 11/2022) MoCA examination 12/30 05/2022 (prior 18 /30 11/2021)  Subjectively, both patient and daughter believes memory has been stable  Continue Namenda 10 mg twice daily- refill provided   On Seroquel per PCP  Denies previously being on Aricept - can consider initiating in the future if indicated but will hold off as memory has been nonprogressive  Increase participation with memory exercises such as crossword puzzles, word search and sudoku.  Continue routine exercise and ensure continued healthy diet, adequate sleep and good management of risk factors             Laboratory evaluation showed no treatable etiology           Worsening gait abnormality, incontinence,             stable since prior visit  Continue to participate in exercise and dancing classes at [facility]  MRI of the cervical spine showed multilevel degenerative changes with potential nerve root compression at left C5 and left C6 - can hold off on neurosurgery evaluation at this time as stable but if any progression, would recommend evaluation at that time       Follow up in 1 year or call earlier if needed   CC:  PCP: Harvest Forest, MD    I spent 34 minutes of face-to-face and non-face-to-face time with patient  and daughter.  This included previsit chart review, lab review, study review, order entry, electronic health record documentation, patient and daughter education and discussion regarding regarding above diagnoses and answered all other questions to patient and daughter satisfaction  Ihor Austin, AGNP-BC  [facility] [address] [place], [place] [address]  Phone [phone] Fax [phone] Note: This document was prepared with digital dictation and possible smart phrase technology. Any transcriptional errors that result from this process are unintentional.

## 2023-12-04 ENCOUNTER — Ambulatory Visit: Payer: Medicare HMO | Admitting: Adult Health

## 2023-12-04 ENCOUNTER — Encounter: Payer: Self-pay | Admitting: Adult Health

## 2023-12-30 ENCOUNTER — Emergency Department (HOSPITAL_COMMUNITY)
Admission: EM | Admit: 2023-12-30 | Discharge: 2023-12-30 | Disposition: A | Discharge status: Home / Self Care | Attending: Emergency Medicine | Admitting: Emergency Medicine

## 2023-12-30 ENCOUNTER — Emergency Department (HOSPITAL_BASED_OUTPATIENT_CLINIC_OR_DEPARTMENT_OTHER)

## 2023-12-30 ENCOUNTER — Encounter (HOSPITAL_COMMUNITY): Payer: Self-pay

## 2023-12-30 ENCOUNTER — Other Ambulatory Visit: Payer: Self-pay

## 2023-12-30 DIAGNOSIS — I129 Hypertensive chronic kidney disease with stage 1 through stage 4 chronic kidney disease, or unspecified chronic kidney disease: Secondary | ICD-10-CM | POA: Diagnosis not present

## 2023-12-30 DIAGNOSIS — F0390 Unspecified dementia without behavioral disturbance: Secondary | ICD-10-CM | POA: Insufficient documentation

## 2023-12-30 DIAGNOSIS — R609 Edema, unspecified: Secondary | ICD-10-CM | POA: Diagnosis not present

## 2023-12-30 DIAGNOSIS — Z7984 Long term (current) use of oral hypoglycemic drugs: Secondary | ICD-10-CM | POA: Insufficient documentation

## 2023-12-30 DIAGNOSIS — N189 Chronic kidney disease, unspecified: Secondary | ICD-10-CM | POA: Insufficient documentation

## 2023-12-30 DIAGNOSIS — Z79899 Other long term (current) drug therapy: Secondary | ICD-10-CM | POA: Diagnosis not present

## 2023-12-30 DIAGNOSIS — M79605 Pain in left leg: Secondary | ICD-10-CM | POA: Diagnosis present

## 2023-12-30 DIAGNOSIS — E1122 Type 2 diabetes mellitus with diabetic chronic kidney disease: Secondary | ICD-10-CM | POA: Insufficient documentation

## 2023-12-30 DIAGNOSIS — Z7902 Long term (current) use of antithrombotics/antiplatelets: Secondary | ICD-10-CM | POA: Insufficient documentation

## 2023-12-30 DIAGNOSIS — Z8673 Personal history of transient ischemic attack (TIA), and cerebral infarction without residual deficits: Secondary | ICD-10-CM | POA: Insufficient documentation

## 2023-12-30 MED ORDER — CEPHALEXIN 500 MG PO CAPS: 500.0000 mg | ORAL_CAPSULE | Freq: Four times a day (QID) | ORAL | 0 refills | Status: DC

## 2023-12-30 NOTE — Progress Notes (Signed)
 LLE venous duplex has been completed.  Preliminary results given to Dr. Rodena Medin.    Results can be found under chart review under CV PROC. 12/30/2023 12:44 PM Judythe Postema RVT, RDMS

## 2023-12-30 NOTE — ED Provider Notes (Signed)
 [place] EMERGENCY DEPARTMENT AT [facility] Provider Note   CSN: [id number] Arrival date & time: 12/30/23  8469     History  No chief complaint on file.   Erik Moss is a 89 y.o. male.  Patient with history of dementia, diabetes, hypertension, hyperlipidemia, CKD, CVA on Plavix presents today with complaints of left leg swelling. States that same has been ongoing for some time and has not changed in any way recently. Specifically notes that his legs have been a little swollen for the past few months. He has not tried anything to manage this. Specifically notes he does not elevate his legs at any time throughout the day and does not wear compression stockings. He denies any pain to his legs. Daughter noticed that the left leg was more swollen than the right a few days ago, had not noticed that before. Went to urgent care yesterday where he had labs including a d dimer collected. He was called today noting this lab was elevated and told to come here to rule out DVT. Presents for same. Denies any chest pain, shortness of breath, dizziness, lightheadedness, or weakness. Patient denies any complaints and states that he feels well. No fevers or chills.  The history is provided by the patient. No language interpreter was used.       Home Medications Prior to Admission medications   Medication Sig Start Date End Date Taking? Authorizing Provider  amLODipine (NORVASC) 5 MG tablet Take 5 mg by mouth daily.    [provider]  atorvastatin (LIPITOR) 40 MG tablet Take 1 tablet (40 mg total) by mouth daily. 10/10/19 09/08/23  [place], Alvira Philips, DO  bisacodyl (DULCOLAX) 5 MG EC tablet Take 5 mg by mouth daily as needed for moderate constipation or mild constipation.    [provider]  blood glucose meter kit and supplies Dispense based on patient and insurance preference. Use up to four times daily as directed. (FOR ICD-10 E10.9, E11.9). 09/25/20   Lanae Boast, MD   Cholecalciferol (D3 PO) Take 1 capsule by mouth daily.    [provider]  diphenhydrAMINE (BENADRYL) 12.5 MG/5ML elixir Take 6.25 mg by mouth 4 (four) times daily as needed for allergies.    [provider]  folic acid (FOLVITE) 1 MG tablet Take 1 tablet (1 mg total) by mouth daily. 09/15/23   [place], [name], DO  memantine (NAMENDA) 10 MG tablet TAKE ONE TABLET BY MOUTH TWICE DAILY 11/09/23   Ihor Austin, NP  mirabegron ER (MYRBETRIQ) 50 MG TB24 tablet Take 50 mg by mouth daily.    [provider]  QUEtiapine (SEROQUEL) 25 MG tablet Take 0.5 tablets (12.5 mg total) by mouth at bedtime. 09/14/23 10/14/23  [place], [name], DO  sitaGLIPtin (JANUVIA) 25 MG tablet Take 25 mg by mouth daily.    [provider]  sodium bicarbonate 650 MG tablet Take 650 mg by mouth 2 (two) times daily.    [provider]  tamsulosin (FLOMAX) 0.4 MG CAPS capsule Take 0.4 mg by mouth at bedtime. 08/02/23   [provider]      Allergies    Patient has no known allergies.    Review of Systems   Review of Systems  All other systems reviewed and are negative.   Physical Exam Updated Vital Signs BP 135/60 (BP Location: Right Arm)   Pulse 78   Temp 98.3 F (36.8 C)   Resp 18   Ht 5\' 6"  (1.676 m)  Wt 65.3 kg   SpO2 100%   BMI 23.24 kg/m  Physical Exam Vitals and nursing note reviewed.  Constitutional:      General: He is not in acute distress.    Appearance: Normal appearance. He is normal weight. He is not ill-appearing, toxic-appearing or diaphoretic.  HENT:     Head: Normocephalic and atraumatic.  Cardiovascular:     Rate and Rhythm: Normal rate.  Pulmonary:     Effort: Pulmonary effort is normal. No respiratory distress.  Musculoskeletal:        General: Normal range of motion.     Cervical back: Normal range of motion.     Comments: Trace nonpiting edema present in bilateral lower extremities. Left slightly greater than right. Left leg  maybe slightly more red and warm than the right, however could be chronic venous stasis changes as these also seem to be present bilaterally. DP and PT pulses intact and 2+. ROM intact without pain. No calf or lower leg tenderness to palpation.  See images below for further  Skin:    General: Skin is warm and dry.  Neurological:     General: No focal deficit present.     Mental Status: He is alert.  Psychiatric:        Mood and Affect: Mood normal.        Behavior: Behavior normal.     ED Results / Procedures / Treatments   Labs (all labs ordered are listed, but only abnormal results are displayed) Labs Reviewed - No data to display  EKG None  Radiology No results found.  Procedures Procedures    Medications Ordered in ED Medications - No data to display  ED Course/ Medical Decision Making/ A&P                                 Medical Decision Making  Patient sent from urgent care for DVT US after elevated d dimer performed at urgent care. He is afebrile, non-toxic appearing, and in no acute distress with reassuring vital signs. Physical exam reveals per images above, patient with chronic venous stasis changes to bilateral lower extremities.  Trace nonpitting edema present as well, left greater than right. Slightly increased erythema to the LLE and slightly more warm. No pain or difficulty with ROM.   Chart reviewed, patient had outpatient labs performed at urgent care yesterday.  These have resulted and revealed  Hgb 10.6 consistent with previous.  Glucose 159, BUN 32, creatinine 2.25 consistent with patients baseline. BNP 217 also consistent with baseline.   DVT ultrasound ordered and obtained and is negative, does show some subcutaneous edema in the calf and ankle.   Informed patient of same. Will cover empirically for cellulitis with Keflex and recommend pcp follow-up with return precautions. Emphasized importance of RICE with compression stockings as well. No signs  or symptoms to warrant further evaluation with repeat labs at this time.   Evaluation and diagnostic testing in the emergency department does not suggest an emergent condition requiring admission or immediate intervention beyond what has been performed at this time.  Plan for discharge with close PCP follow-up.  Patient is understanding and amenable with plan, educated on red flag symptoms that would prompt immediate return.  Patient discharged in stable condition.  Final Clinical Impression(s) / ED Diagnoses Final diagnoses:  Left leg pain    Rx / DC Orders ED Discharge Orders  Ordered    cephALEXin (KEFLEX) 500 MG capsule  4 times daily        12/30/23 1229          An After Visit Summary was printed and given to the patient.     Vear Clock 12/30/23 1231    Wynetta Fines, MD 12/30/23 [phone]

## 2023-12-30 NOTE — ED Triage Notes (Signed)
 Pt went to urgent care earlier today for darkened discoloration in bilateral lower extremities for past month along with swelling in LLE for unknown amount of time. UC encouraged patient to come to ER d/t blood work being abnormal.  Pt denies SOB, weakness, fatigue, dizziness, CP, N/V/D. LLE is swollen but not painful or red.

## 2023-12-30 NOTE — ED Triage Notes (Signed)
 D-dimer elevated from UC along with mildly elevated BNP.

## 2023-12-30 NOTE — Discharge Instructions (Addendum)
 As we discussed, your workup in the ER today was reassuring for acute findings.  Your ultrasound imaging of your leg was negative for any blood clots.  It did show some swelling which could be due to other chronic medical conditions.  I have given you a prescription for antibiotics to cover for any infectious cause such as cellulitis.  Please fill and take this as prescribed in its entirety.  I also recommend that you keep your legs elevated when you are sitting and wear compression stockings.  Please follow-up with your primary care provider at your earliest convenience.  Return if development of any new or worsening symptoms.

## 2024-03-23 ENCOUNTER — Other Ambulatory Visit: Payer: Self-pay | Admitting: Adult Health

## 2024-05-29 ENCOUNTER — Ambulatory Visit: Admitting: Podiatry

## 2024-06-17 ENCOUNTER — Encounter: Payer: Self-pay | Admitting: Podiatry

## 2024-06-17 ENCOUNTER — Ambulatory Visit: Admitting: Podiatry

## 2024-06-17 VITALS — Ht 66.0 in | Wt 144.0 lb

## 2024-06-17 DIAGNOSIS — B351 Tinea unguium: Secondary | ICD-10-CM | POA: Diagnosis not present

## 2024-06-17 DIAGNOSIS — M79674 Pain in right toe(s): Secondary | ICD-10-CM | POA: Diagnosis not present

## 2024-06-17 DIAGNOSIS — M79675 Pain in left toe(s): Secondary | ICD-10-CM

## 2024-06-18 NOTE — Progress Notes (Signed)
 Subjective:   Patient ID: Erik Moss, male   DOB: 89 y.o.   MRN: [id number]   HPI Patient presents with elongated nailbeds 1-5 both feet that are thickened dystrophic and he cannot cut and presents with caregiver   ROS      Objective:  Physical Exam  Neurovascular status intact with thick yellow brittle nailbeds 1-5 both feet that are irritated for him     Assessment:  Chronic mycotic nail infection 1-5 both feet with irritation     Plan:  Debridement of nailbeds 1-5 both feet no iatrogenic bleeding reappoint routine care

## 2024-06-22 ENCOUNTER — Encounter (HOSPITAL_COMMUNITY): Payer: Self-pay | Admitting: Emergency Medicine

## 2024-06-22 ENCOUNTER — Other Ambulatory Visit: Payer: Self-pay

## 2024-06-22 ENCOUNTER — Inpatient Hospital Stay (HOSPITAL_COMMUNITY)
Admission: EM | Admit: 2024-06-22 | Discharge: 2024-06-24 | DRG: 092 | Disposition: A | Discharge status: Home / Self Care | Attending: Family Medicine | Admitting: Family Medicine

## 2024-06-22 ENCOUNTER — Emergency Department (HOSPITAL_COMMUNITY)

## 2024-06-22 DIAGNOSIS — K5900 Constipation, unspecified: Secondary | ICD-10-CM | POA: Diagnosis present

## 2024-06-22 DIAGNOSIS — R569 Unspecified convulsions: Secondary | ICD-10-CM | POA: Diagnosis present

## 2024-06-22 DIAGNOSIS — R262 Difficulty in walking, not elsewhere classified: Secondary | ICD-10-CM | POA: Diagnosis present

## 2024-06-22 DIAGNOSIS — I10 Essential (primary) hypertension: Secondary | ICD-10-CM | POA: Diagnosis present

## 2024-06-22 DIAGNOSIS — E119 Type 2 diabetes mellitus without complications: Secondary | ICD-10-CM

## 2024-06-22 DIAGNOSIS — G90A Postural orthostatic tachycardia syndrome (POTS): Principal | ICD-10-CM | POA: Diagnosis present

## 2024-06-22 DIAGNOSIS — N400 Enlarged prostate without lower urinary tract symptoms: Secondary | ICD-10-CM | POA: Diagnosis present

## 2024-06-22 DIAGNOSIS — F0390 Unspecified dementia without behavioral disturbance: Secondary | ICD-10-CM | POA: Diagnosis present

## 2024-06-22 DIAGNOSIS — I69318 Other symptoms and signs involving cognitive functions following cerebral infarction: Secondary | ICD-10-CM

## 2024-06-22 DIAGNOSIS — Z79899 Other long term (current) drug therapy: Secondary | ICD-10-CM

## 2024-06-22 DIAGNOSIS — I129 Hypertensive chronic kidney disease with stage 1 through stage 4 chronic kidney disease, or unspecified chronic kidney disease: Secondary | ICD-10-CM | POA: Diagnosis present

## 2024-06-22 DIAGNOSIS — R404 Transient alteration of awareness: Secondary | ICD-10-CM | POA: Diagnosis not present

## 2024-06-22 DIAGNOSIS — F01A0 Vascular dementia, mild, without behavioral disturbance, psychotic disturbance, mood disturbance, and anxiety: Secondary | ICD-10-CM | POA: Diagnosis not present

## 2024-06-22 DIAGNOSIS — E1165 Type 2 diabetes mellitus with hyperglycemia: Secondary | ICD-10-CM | POA: Diagnosis present

## 2024-06-22 DIAGNOSIS — I959 Hypotension, unspecified: Secondary | ICD-10-CM | POA: Diagnosis present

## 2024-06-22 DIAGNOSIS — H5702 Anisocoria: Secondary | ICD-10-CM | POA: Diagnosis present

## 2024-06-22 DIAGNOSIS — R531 Weakness: Secondary | ICD-10-CM

## 2024-06-22 DIAGNOSIS — N184 Chronic kidney disease, stage 4 (severe): Secondary | ICD-10-CM | POA: Diagnosis present

## 2024-06-22 DIAGNOSIS — E785 Hyperlipidemia, unspecified: Secondary | ICD-10-CM | POA: Diagnosis present

## 2024-06-22 DIAGNOSIS — Z7984 Long term (current) use of oral hypoglycemic drugs: Secondary | ICD-10-CM

## 2024-06-22 DIAGNOSIS — I440 Atrioventricular block, first degree: Secondary | ICD-10-CM | POA: Diagnosis present

## 2024-06-22 DIAGNOSIS — R55 Syncope and collapse: Principal | ICD-10-CM | POA: Diagnosis present

## 2024-06-22 DIAGNOSIS — E1122 Type 2 diabetes mellitus with diabetic chronic kidney disease: Secondary | ICD-10-CM | POA: Diagnosis present

## 2024-06-22 LAB — CBC
HCT: 27 % — ABNORMAL LOW (ref 39.0–52.0)
Hemoglobin: 8.4 g/dL — ABNORMAL LOW (ref 13.0–17.0)
MCH: 31.2 pg (ref 26.0–34.0)
MCHC: 31.1 g/dL (ref 30.0–36.0)
MCV: 100.4 fL — ABNORMAL HIGH (ref 80.0–100.0)
Platelets: 251 K/uL (ref 150–400)
RBC: 2.69 MIL/uL — ABNORMAL LOW (ref 4.22–5.81)
RDW: 14 % (ref 11.5–15.5)
WBC: 5.3 K/uL (ref 4.0–10.5)
nRBC: 0 % (ref 0.0–0.2)

## 2024-06-22 LAB — URINALYSIS, ROUTINE W REFLEX MICROSCOPIC
Bilirubin Urine: NEGATIVE
Glucose, UA: 50 mg/dL — AB
Ketones, ur: NEGATIVE mg/dL
Leukocytes,Ua: NEGATIVE
Nitrite: NEGATIVE
Protein, ur: 30 mg/dL — AB
Specific Gravity, Urine: 1.013 (ref 1.005–1.030)
pH: 5 (ref 5.0–8.0)

## 2024-06-22 LAB — COMPREHENSIVE METABOLIC PANEL WITH GFR
ALT: 19 U/L (ref 0–44)
AST: 18 U/L (ref 15–41)
Albumin: 3 g/dL — ABNORMAL LOW (ref 3.5–5.0)
Alkaline Phosphatase: 70 U/L (ref 38–126)
Anion gap: 12 (ref 5–15)
BUN: 39 mg/dL — ABNORMAL HIGH (ref 8–23)
CO2: 16 mmol/L — ABNORMAL LOW (ref 22–32)
Calcium: 8.2 mg/dL — ABNORMAL LOW (ref 8.9–10.3)
Chloride: 111 mmol/L (ref 98–111)
Creatinine, Ser: 2.41 mg/dL — ABNORMAL HIGH (ref 0.61–1.24)
GFR, Estimated: 25 mL/min — ABNORMAL LOW (ref >60)
Glucose, Bld: 182 mg/dL — ABNORMAL HIGH (ref 70–99)
Potassium: 3.9 mmol/L (ref 3.5–5.1)
Sodium: 139 mmol/L (ref 135–145)
Total Bilirubin: 0.3 mg/dL (ref 0.0–1.2)
Total Protein: 5.3 g/dL — ABNORMAL LOW (ref 6.5–8.1)

## 2024-06-22 LAB — CK: Total CK: 307 U/L (ref 49–397)

## 2024-06-22 LAB — CBG MONITORING, ED: Glucose-Capillary: 176 mg/dL — ABNORMAL HIGH (ref 70–99)

## 2024-06-22 LAB — TROPONIN I (HIGH SENSITIVITY)
Troponin I (High Sensitivity): 9 ng/L (ref <18)
Troponin I (High Sensitivity): 8 ng/L (ref <18)

## 2024-06-22 NOTE — ED Provider Notes (Addendum)
 [place] [facility] Provider Note   CSN: [id number] Arrival date & time: 06/22/24  1502     Patient presents with: Altered Mental Status, Hypotension, and Loss of Consciousness   Erik Moss is a 89 y.o. male.   Patient brought in by EMS.  Patient had an event at home.  Daughter did witness it.  Patient was out on the porch and was unable to get back in the house.  He has been having difficulty walking for the past couple weeks.  He kind of stumbled sat in a chair and to her he came completely unresponsive.  Questionable maybe shaking like a seizure.  Supposedly according to EMS when they arrived he had a Glasgow Coma Scale of 3 shallow respirations blood pressure was 76/46 heart rate was 120 they bagged him for about 25 minutes and gave him 500 cc bolus normal saline patient became more responsive with a Glasgow Coma Scale of 14.  Has a history of dementia but started answering questions.  Patient without any complaints currently.  Moves all 4 extremities without any difficulty.       Prior to Admission medications   Medication Sig Start Date End Date Taking? Authorizing Provider  amLODipine  (NORVASC ) 5 MG tablet Take 5 mg by mouth daily.    [provider]  atorvastatin  (LIPITOR ) 40 MG tablet Take 1 tablet (40 mg total) by mouth daily. 10/10/19 06/17/24  [place], Camellia PARAS, DO  bisacodyl  (DULCOLAX) 5 MG EC tablet Take 5 mg by mouth daily as needed for moderate constipation or mild constipation.    [provider]  blood glucose meter kit and supplies Dispense based on patient and insurance preference. Use up to four times daily as directed. (FOR ICD-10 E10.9, E11.9). 09/25/20   Christobal Guadalajara, MD  cephALEXin  (KEFLEX ) 500 MG capsule Take 1 capsule (500 mg total) by mouth 4 (four) times daily. 12/30/23   Smoot, Lauraine LABOR, PA-C  Cholecalciferol (D3 PO) Take 1 capsule by mouth daily.    [provider]  diphenhydrAMINE  (BENADRYL )  12.5 MG/5ML elixir Take 6.25 mg by mouth 4 (four) times daily as needed for allergies.    [provider]  folic acid  (FOLVITE ) 1 MG tablet Take 1 tablet (1 mg total) by mouth daily. 09/15/23   [place], [name], DO  memantine  (NAMENDA ) 10 MG tablet TAKE ONE TABLET BY MOUTH TWICE DAILY 11/09/23   Whitfield Raisin, NP  mirabegron  ER (MYRBETRIQ ) 50 MG TB24 tablet Take 50 mg by mouth daily.    [provider]  QUEtiapine  (SEROQUEL ) 25 MG tablet Take 0.5 tablets (12.5 mg total) by mouth at bedtime. 09/14/23 06/17/24  [place], Camellia PARAS, DO  sitaGLIPtin (JANUVIA) 25 MG tablet Take 25 mg by mouth daily.    [provider]  sodium bicarbonate  650 MG tablet Take 650 mg by mouth 2 (two) times daily.    [provider]  tamsulosin  (FLOMAX ) 0.4 MG CAPS capsule Take 0.4 mg by mouth at bedtime. 08/02/23   [provider]    Allergies: Patient has no known allergies.    Review of Systems  Constitutional:  Negative for chills and fever.  HENT:  Negative for ear pain and sore throat.   Eyes:  Negative for pain and visual disturbance.  Respiratory:  Negative for cough and shortness of breath.   Cardiovascular:  Negative for chest pain and palpitations.  Gastrointestinal:  Negative for abdominal pain and vomiting.  Genitourinary:  Negative for  dysuria and hematuria.  Musculoskeletal:  Negative for arthralgias and back pain.  Skin:  Negative for color change and rash.  Neurological:  Positive for seizures, syncope and weakness.  Psychiatric/Behavioral:  Positive for confusion.   All other systems reviewed and are negative.   Updated Vital Signs BP 133/62   Pulse 80   Temp 98.1 F (36.7 C) (Oral)   Resp 16   SpO2 100%   Physical Exam Vitals and nursing note reviewed.  Constitutional:      General: He is not in acute distress.    Appearance: Normal appearance. He is well-developed.  HENT:     Head: Normocephalic and atraumatic.     Mouth/Throat:     Mouth:  Mucous membranes are moist.  Eyes:     Conjunctiva/sclera: Conjunctivae normal.     Pupils: Pupils are equal, round, and reactive to light.  Cardiovascular:     Rate and Rhythm: Normal rate and regular rhythm.     Heart sounds: No murmur heard. Pulmonary:     Effort: Pulmonary effort is normal. No respiratory distress.     Breath sounds: Normal breath sounds.  Abdominal:     Palpations: Abdomen is soft.     Tenderness: There is no abdominal tenderness.  Musculoskeletal:        General: No swelling.     Cervical back: Normal range of motion and neck supple.     Right lower leg: No edema.     Left lower leg: No edema.  Skin:    General: Skin is warm and dry.     Capillary Refill: Capillary refill takes less than 2 seconds.  Neurological:     General: No focal deficit present.     Mental Status: He is alert and oriented to person, place, and time.     Cranial Nerves: No cranial nerve deficit.     Sensory: No sensory deficit.     Motor: No weakness.  Psychiatric:        Mood and Affect: Mood normal.     (all labs ordered are listed, but only abnormal results are displayed) Labs Reviewed  COMPREHENSIVE METABOLIC PANEL WITH GFR - Abnormal; Notable for the following components:      Result Value   CO2 16 (*)    Glucose, Bld 182 (*)    BUN 39 (*)    Creatinine, Ser 2.41 (*)    Calcium  8.2 (*)    Total Protein 5.3 (*)    Albumin 3.0 (*)    GFR, Estimated 25 (*)    All other components within normal limits  CBC - Abnormal; Notable for the following components:   RBC 2.69 (*)    Hemoglobin 8.4 (*)    HCT 27.0 (*)    MCV 100.4 (*)    All other components within normal limits  CBG MONITORING, ED - Abnormal; Notable for the following components:   Glucose-Capillary 176 (*)    All other components within normal limits  URINALYSIS, ROUTINE W REFLEX MICROSCOPIC    EKG: EKG Interpretation Date/Time:  Saturday June 22 2024 15:14:02 EDT Ventricular Rate:  86 PR  Interval:  271 QRS Duration:  89 QT Interval:  374 QTC Calculation: 448 R Axis:   -49  Text Interpretation: Sinus rhythm Prolonged PR interval Left anterior fascicular block Consider anterior infarct No significant change since last tracing Confirmed by Dashanna Kinnamon [phone]) on 06/22/2024 3:20:37 PM  Radiology: No results found.   Procedures   Medications Ordered in  the ED - No data to display                                  Medical Decision Making Amount and/or Complexity of Data Reviewed Labs: ordered. Radiology: ordered.  Patient's complete metabolic panel electrolytes normal other than CO2 16 glucose 182.  Creatinine 2.41 for GFR 25 will compare that to what is normal for him.  Liver function test are normal.  CBC white count is 5.3 hemoglobin 8.4 hematocrit 27.  EKG without acute findings.  Will do head CT because of the questionable seizure.  Will get chest x-ray.  Will continue fluids.  Patient probably warrants admission and observation due to the events that occurred at home EMS made it sound like he was awake when they got there but family member clearly made it sound like he was completely out of it for a while.  He was not out of it out in the yard never was in the ER was just on the front porch.  No real concerns for elevated CK.  Renal function slightly worse than baseline but not significantly worse.  Last time we have labs was December 2024.  Patient troponin 9 CK3 07 so no evidence of rhabdo.  And urinalysis negative.  No signs of urinary tract infection.  CT head done just to be complete no acute intercranial abnormality.  Chest x-ray 1 cm nodular density BX of the right lower lung field indeterminate recommend CT follow-up.  For the events that occurred at home will contact the hospitalist for admission.   Final diagnoses:  Syncope, unspecified syncope type    ED Discharge Orders     None          Geraldene Hamilton, MD 06/22/24 1657     Geraldene Hamilton, MD 06/22/24 1659    Geraldene Hamilton, MD 06/22/24 2016

## 2024-06-22 NOTE — ED Triage Notes (Addendum)
 PT BIB [facility] from home for AMS/near syncope with Hypotension.  Pt was outside for about 45 min.  Per daughter, upon entering house he stumbled and sat in chair becoming altered. EMS found him to have a GCS of 3 with shallow respirations and a BP f 76/46, HR 120.SABRA  They bagged him for about 25 min and gave a 500 ml fluid bolus of NS.  PT became more responsive with a GCS of 14.  Pt has hx of dementia but is answering orientation questions appropriately on arrival. Pt also has hx of DM. He also has some trouble with moving his bowels.  Takes senokot and Miralax  at home.  Abd soft and non-tender   Last VS: 1300/80 98% RA, HR 86 CBG 218.

## 2024-06-22 NOTE — ED Notes (Signed)
 CCMD called.

## 2024-06-22 NOTE — H&P (Signed)
 History and Physical    Erik Moss FMW:[id number] DOB: 04-01-1933 DOA: 06/22/2024  PCP: Roanna Ezekiel NOVAK, MD Patient coming from:  Chief Complaint: unresponsive and hypotensive  HPI: Erik Moss is a 89 y.o. male with medical history significant of  dementia, diabetes, hypertension, HLD, CKD, CVA on plavix . The patient states that he was in his normal state of health until today. He states that he had been having some constipation. He [facility]ends the [facility] and they recommended that he drink some hot water when he is experiencing these symptoms. The patient reported that he attempted it and appeared to work well. Given he was having the same symptoms again this morning, he decided to drink hot water this morning. He decided to ambulate to his porch to have his hot drink. He states that while outside he became unresponsive. His daughter was at the house and went to find him. She states that she noted that his eyes appeared large and he was not responding. This occurred for about 30 minutes. The patient then subsequently starts to respond. She called EMS and it was noted that patient was hypotensive and he was given 500cc bolus of fluids. Per ER provider there was some question of whether patient was bagged as reported by daughter.The patient did start responding and was brought to the hospital for further evaluation. ED Course:  In the ER, BP 114/76, HR72, RR 11, O2 saturation 100% on RA, and Tmax 98.6. Cbc demonstrated wbc 5.3,  hb/hct 8.4/27.0, and platelet 251.  MCV 100.4. Chemistry demonstrated Na 139, K 3.9, Cl 111, bicarb 16, Bun/Cr 39/2.41 and glucose 182.Initial troponin was 9 and repeat is 8.  CXR demonstrated 1 cm nodular density projects over the right lower lung, indeterminate. Recommend repeat CXR or CT chest. Urinalysis was negative for acute infection. Ct head was negative for acute intracranial abnormality. EKG findings  which demonstrated sinus rhythm and prolonged PR  interval.  Review of Systems:  All systems reviewed and apart from history of presenting illness, are negative.  Past Medical History:  Diagnosis Date   BPH (benign prostatic hyperplasia) 10/08/2019   Diabetes mellitus without complication (HCC)    Hyperlipidemia 10/08/2019   Hypertension 10/08/2019   Renal disease    Stroke [facility])    TIA (transient ischemic attack)    Type 2 diabetes mellitus (HCC) 10/08/2019    Past Surgical History:  Procedure Laterality Date   BUBBLE STUDY  10/10/2019   Procedure: BUBBLE STUDY;  Surgeon: Barbaraann Darryle Ned, MD;  Location: [facility] ENDOSCOPY;  Service: Cardiology;;   TEE WITHOUT CARDIOVERSION N/A 10/10/2019   Procedure: TRANSESOPHAGEAL ECHOCARDIOGRAM (TEE);  Surgeon: Barbaraann Darryle Ned, MD;  Location: [facility] ENDOSCOPY;  Service: Cardiology;  Laterality: N/A;     reports that he has never smoked. He has never used smokeless tobacco. He reports that he does not drink alcohol and does not use drugs.  No Known Allergies  Family History  Problem Relation Age of Onset   Other Mother        Passed from old age   Other Father        Accident    Prior to Admission medications   Medication Sig Start Date End Date Taking? Authorizing Provider  amLODipine  (NORVASC ) 5 MG tablet Take 5 mg by mouth daily.    [provider]  atorvastatin  (LIPITOR ) 40 MG tablet Take 1 tablet (40 mg total) by mouth daily. 10/10/19 06/17/24  [place], Camellia PARAS, DO  bisacodyl  (DULCOLAX) 5 MG  EC tablet Take 5 mg by mouth daily as needed for moderate constipation or mild constipation.    [provider]  blood glucose meter kit and supplies Dispense based on patient and insurance preference. Use up to four times daily as directed. (FOR ICD-10 E10.9, E11.9). 09/25/20   Christobal Gu[place], MD  cephALEXin  (KEFLEX ) 500 MG capsule Take 1 capsule (500 mg total) by mouth 4 (four) times daily. 12/30/23   Smoot, Lauraine LABOR, PA-C  Cholecalciferol (D3 PO) Take 1 capsule by mouth daily.     [provider]  diphenhydrAMINE  (BENADRYL ) 12.5 MG/5ML elixir Take 6.25 mg by mouth 4 (four) times daily as needed for allergies.    [provider]  folic acid  (FOLVITE ) 1 MG tablet Take 1 tablet (1 mg total) by mouth daily. 09/15/23   [place], [name], DO  memantine  (NAMENDA ) 10 MG tablet TAKE ONE TABLET BY MOUTH TWICE DAILY 11/09/23   Whitfield Raisin, NP  mirabegron  ER (MYRBETRIQ ) 50 MG TB24 tablet Take 50 mg by mouth daily.    [provider]  QUEtiapine  (SEROQUEL ) 25 MG tablet Take 0.5 tablets (12.5 mg total) by mouth at bedtime. 09/14/23 06/17/24  [place], Camellia PARAS, DO  sitaGLIPtin (JANUVIA) 25 MG tablet Take 25 mg by mouth daily.    [provider]  sodium bicarbonate  650 MG tablet Take 650 mg by mouth 2 (two) times daily.    [provider]  tamsulosin  (FLOMAX ) 0.4 MG CAPS capsule Take 0.4 mg by mouth at bedtime. 08/02/23   [provider]    Physical Exam: Vitals:   06/22/24 1630 06/22/24 1943 06/22/24 1945 06/22/24 2000  BP: 133/62  132/60 114/76  Pulse: 80  70 72  Resp: 16  12 11   Temp:  98.6 F (37 C)    TempSrc:  Oral    SpO2: 100%  100% 100%    Physical Exam Constitutional:      General: He is not in acute distress.    Appearance: Normal appearance.  HENT:     Head: Normocephalic and atraumatic.  Eyes:     Extraocular Movements: Extraocular movements intact.     Conjunctiva/sclera: Conjunctivae normal.     Pupils: Pupils are equal, round, and reactive to light.  Cardiovascular:     Rate and Rhythm: Normal rate and regular rhythm.     Pulses: Normal pulses.     Heart sounds: Normal heart sounds.  Pulmonary:     Effort: Pulmonary effort is normal. No respiratory distress.     Breath sounds: Normal breath sounds. No wheezing, rhonchi or rales.  Abdominal:     General: Abdomen is flat. Bowel sounds are normal. There is no distension.     Palpations: Abdomen is soft.     Tenderness: There is no abdominal tenderness.   Musculoskeletal:        General: No deformity. Normal range of motion.  Skin:    General: Skin is warm and dry.     Coloration: Skin is not jaundiced.  Neurological:     General: No focal deficit present.     Mental Status: He is alert and oriented to person, place, and time. Mental status is at baseline.   Labs on Admission: I have personally reviewed following labs and imaging studies  CBC: Recent Labs  Lab 06/22/24 1518  WBC 5.3  HGB 8.4*  HCT 27.0*  MCV 100.4*  PLT 251   Basic Metabolic Panel: Recent Labs  Lab 06/22/24 1518  NA 139  K 3.9  CL 111  CO2 16*  GLUCOSE 182*  BUN 39*  CREATININE 2.41*  CALCIUM  8.2*   GFR: Estimated Creatinine Clearance: 18.8 mL/min (A) (by C-G formula based on SCr of 2.41 mg/dL (H)). Liver Function Tests: Recent Labs  Lab 06/22/24 1518  AST 18  ALT 19  ALKPHOS 70  BILITOT 0.3  PROT 5.3*  ALBUMIN 3.0*   No results for input(s): LIPASE, AMYLASE in the last 168 hours. No results for input(s): AMMONIA in the last 168 hours. Coagulation Profile: No results for input(s): INR, PROTIME in the last 168 hours. Cardiac Enzymes: Recent Labs  Lab 06/22/24 1659  CKTOTAL 307   BNP (last 3 results) No results for input(s): PROBNP in the last 8760 hours. HbA1C: No results for input(s): HGBA1C in the last 72 hours. CBG: Recent Labs  Lab 06/22/24 1530  GLUCAP 176*   Lipid Profile: No results for input(s): CHOL, HDL, LDLCALC, TRIG, CHOLHDL, LDLDIRECT in the last 72 hours. Thyroid  Function Tests: No results for input(s): TSH, T4TOTAL, FREET4, T3FREE, THYROIDAB in the last 72 hours. Anemia Panel: No results for input(s): VITAMINB12, FOLATE, FERRITIN, TIBC, IRON , RETICCTPCT in the last 72 hours. Urine analysis:    Component Value Date/Time   COLORURINE YELLOW 06/22/2024 1518   APPEARANCEUR HAZY (A) 06/22/2024 1518   LABSPEC 1.013 06/22/2024 1518   PHURINE 5.0 06/22/2024 1518    GLUCOSEU 50 (A) 06/22/2024 1518   HGBUR SMALL (A) 06/22/2024 1518   BILIRUBINUR NEGATIVE 06/22/2024 1518   KETONESUR NEGATIVE 06/22/2024 1518   PROTEINUR 30 (A) 06/22/2024 1518   NITRITE NEGATIVE 06/22/2024 1518   LEUKOCYTESUR NEGATIVE 06/22/2024 1518    Radiological Exams on Admission:    Assessment/Plan Principal Problem:   Unresponsive episode Active Problems:   Type 2 diabetes mellitus (HCC)   Hyperlipidemia   Hypertension   Hyperglycemia due to diabetes mellitus (HCC)   Dementia without behavioral disturbance (HCC)   Unresponsive episode The patient was noted to be unresponsive for about 30 minutes Will obtain EEG to evaluate for possible seizure activity CT head was performed which was negative for acute intracranial pathology EKG was performed which demonstrated normal sinus rhythm  Will place the patient on telemetry overnight for continuous monitoring The patient has recovered and is responding appropriately He is able to move all extremities and it did not appear that he had a stroke MRI brain has not been ordered at this time   DM type 2 Will start on sliding scale insulin   HLD Will continue statin therapy  Dementia without behavioral disturbance This is chronic for this patient   Hypertension Will hold off on blood pressure medications at this time Patient was hypotensive Will continue to give IV hydration overnight   CKD stage 3 Cr is 2.41 This appears to be close to his baseline   DVT prophylaxis: scds  Code Status:Full Family Communication: spoke to the patient's daughter  Disposition Plan:  Patient will be placed in observation status.    Consults called: none Admission status: observation status Level of care: Level of care:   Bradly MARLA Drones MD [facility]  If 7PM-7AM, please contact night-coverage [email or web address]  06/22/2024, 9:51 PM

## 2024-06-23 ENCOUNTER — Observation Stay (HOSPITAL_BASED_OUTPATIENT_CLINIC_OR_DEPARTMENT_OTHER)

## 2024-06-23 ENCOUNTER — Observation Stay (HOSPITAL_COMMUNITY)

## 2024-06-23 DIAGNOSIS — E1122 Type 2 diabetes mellitus with diabetic chronic kidney disease: Secondary | ICD-10-CM | POA: Diagnosis present

## 2024-06-23 DIAGNOSIS — R531 Weakness: Secondary | ICD-10-CM

## 2024-06-23 DIAGNOSIS — N184 Chronic kidney disease, stage 4 (severe): Secondary | ICD-10-CM | POA: Diagnosis present

## 2024-06-23 DIAGNOSIS — Z7984 Long term (current) use of oral hypoglycemic drugs: Secondary | ICD-10-CM | POA: Diagnosis not present

## 2024-06-23 DIAGNOSIS — Z79899 Other long term (current) drug therapy: Secondary | ICD-10-CM | POA: Diagnosis not present

## 2024-06-23 DIAGNOSIS — R55 Syncope and collapse: Secondary | ICD-10-CM

## 2024-06-23 DIAGNOSIS — E1165 Type 2 diabetes mellitus with hyperglycemia: Secondary | ICD-10-CM | POA: Diagnosis present

## 2024-06-23 DIAGNOSIS — G90A Postural orthostatic tachycardia syndrome (POTS): Secondary | ICD-10-CM | POA: Diagnosis present

## 2024-06-23 DIAGNOSIS — I69318 Other symptoms and signs involving cognitive functions following cerebral infarction: Secondary | ICD-10-CM | POA: Diagnosis not present

## 2024-06-23 DIAGNOSIS — R262 Difficulty in walking, not elsewhere classified: Secondary | ICD-10-CM | POA: Diagnosis present

## 2024-06-23 DIAGNOSIS — R569 Unspecified convulsions: Secondary | ICD-10-CM | POA: Diagnosis present

## 2024-06-23 DIAGNOSIS — I440 Atrioventricular block, first degree: Secondary | ICD-10-CM | POA: Diagnosis present

## 2024-06-23 DIAGNOSIS — E785 Hyperlipidemia, unspecified: Secondary | ICD-10-CM | POA: Diagnosis present

## 2024-06-23 DIAGNOSIS — I959 Hypotension, unspecified: Secondary | ICD-10-CM | POA: Diagnosis present

## 2024-06-23 DIAGNOSIS — K5900 Constipation, unspecified: Secondary | ICD-10-CM | POA: Diagnosis present

## 2024-06-23 DIAGNOSIS — N400 Enlarged prostate without lower urinary tract symptoms: Secondary | ICD-10-CM | POA: Diagnosis present

## 2024-06-23 DIAGNOSIS — I3139 Other pericardial effusion (noninflammatory): Secondary | ICD-10-CM | POA: Diagnosis not present

## 2024-06-23 DIAGNOSIS — I340 Nonrheumatic mitral (valve) insufficiency: Secondary | ICD-10-CM

## 2024-06-23 DIAGNOSIS — F01A0 Vascular dementia, mild, without behavioral disturbance, psychotic disturbance, mood disturbance, and anxiety: Secondary | ICD-10-CM | POA: Diagnosis present

## 2024-06-23 DIAGNOSIS — H5702 Anisocoria: Secondary | ICD-10-CM | POA: Diagnosis present

## 2024-06-23 DIAGNOSIS — R404 Transient alteration of awareness: Secondary | ICD-10-CM | POA: Diagnosis not present

## 2024-06-23 DIAGNOSIS — I129 Hypertensive chronic kidney disease with stage 1 through stage 4 chronic kidney disease, or unspecified chronic kidney disease: Secondary | ICD-10-CM | POA: Diagnosis present

## 2024-06-23 LAB — CBC WITH DIFFERENTIAL/PLATELET
Abs Immature Granulocytes: 0.02 K/uL (ref 0.00–0.07)
Basophils Absolute: 0.1 K/uL (ref 0.0–0.1)
Basophils Relative: 1 %
Eosinophils Absolute: 0.4 K/uL (ref 0.0–0.5)
Eosinophils Relative: 5 %
HCT: 31.3 % — ABNORMAL LOW (ref 39.0–52.0)
Hemoglobin: 10.3 g/dL — ABNORMAL LOW (ref 13.0–17.0)
Immature Granulocytes: 0 %
Lymphocytes Relative: 33 %
Lymphs Abs: 2.8 K/uL (ref 0.7–4.0)
MCH: 31 pg (ref 26.0–34.0)
MCHC: 32.9 g/dL (ref 30.0–36.0)
MCV: 94.3 fL (ref 80.0–100.0)
Monocytes Absolute: 0.8 K/uL (ref 0.1–1.0)
Monocytes Relative: 9 %
Neutro Abs: 4.4 K/uL (ref 1.7–7.7)
Neutrophils Relative %: 52 %
Platelets: 293 K/uL (ref 150–400)
RBC: 3.32 MIL/uL — ABNORMAL LOW (ref 4.22–5.81)
RDW: 14.1 % (ref 11.5–15.5)
WBC: 8.5 K/uL (ref 4.0–10.5)
nRBC: 0 % (ref 0.0–0.2)

## 2024-06-23 LAB — GLUCOSE, CAPILLARY
Glucose-Capillary: 114 mg/dL — ABNORMAL HIGH (ref 70–99)
Glucose-Capillary: 162 mg/dL — ABNORMAL HIGH (ref 70–99)
Glucose-Capillary: 150 mg/dL — ABNORMAL HIGH (ref 70–99)

## 2024-06-23 LAB — ECHOCARDIOGRAM COMPLETE
Area-P 1/2: 3.54 cm2
Height: 66 in
S' Lateral: 2.1 cm
Weight: 2172.85 [oz_av]

## 2024-06-23 LAB — COMPREHENSIVE METABOLIC PANEL WITH GFR
ALT: 20 U/L (ref 0–44)
AST: 14 U/L — ABNORMAL LOW (ref 15–41)
Albumin: 3.5 g/dL (ref 3.5–5.0)
Alkaline Phosphatase: 87 U/L (ref 38–126)
Anion gap: 11 (ref 5–15)
BUN: 39 mg/dL — ABNORMAL HIGH (ref 8–23)
CO2: 20 mmol/L — ABNORMAL LOW (ref 22–32)
Calcium: 9.5 mg/dL (ref 8.9–10.3)
Chloride: 109 mmol/L (ref 98–111)
Creatinine, Ser: 2.32 mg/dL — ABNORMAL HIGH (ref 0.61–1.24)
GFR, Estimated: 26 mL/min — ABNORMAL LOW (ref >60)
Glucose, Bld: 113 mg/dL — ABNORMAL HIGH (ref 70–99)
Potassium: 4.8 mmol/L (ref 3.5–5.1)
Sodium: 140 mmol/L (ref 135–145)
Total Bilirubin: 0.5 mg/dL (ref 0.0–1.2)
Total Protein: 6.5 g/dL (ref 6.5–8.1)

## 2024-06-23 LAB — HEMOGLOBIN A1C
Hgb A1c MFr Bld: 8.3 % — ABNORMAL HIGH (ref 4.8–5.6)
Mean Plasma Glucose: 191.51 mg/dL

## 2024-06-23 LAB — CK: Total CK: 302 U/L (ref 49–397)

## 2024-06-23 MED ORDER — ACETAMINOPHEN 325 MG PO TABS
650.0000 mg | ORAL_TABLET | Freq: Four times a day (QID) | ORAL | Status: DC | PRN
Start: 2024-06-23 — End: 2024-06-24

## 2024-06-23 MED ORDER — INSULIN ASPART 100 UNIT/ML IJ SOLN
0.0000 [IU] | Freq: Three times a day (TID) | INTRAMUSCULAR | Status: DC
  Administered 2024-06-23: 2 [IU] via SUBCUTANEOUS

## 2024-06-23 MED ORDER — HYDROCODONE-ACETAMINOPHEN 5-325 MG PO TABS: 1.0000 | ORAL_TABLET | ORAL | Status: DC | PRN

## 2024-06-23 MED ORDER — ACETAMINOPHEN 650 MG RE SUPP: 650.0000 mg | Freq: Four times a day (QID) | RECTAL | Status: DC | PRN

## 2024-06-23 MED ORDER — ENOXAPARIN SODIUM 30 MG/0.3ML IJ SOSY
30.0000 mg | PREFILLED_SYRINGE | INTRAMUSCULAR | Status: DC
  Administered 2024-06-23 – 2024-06-24 (×2): 30 mg via SUBCUTANEOUS
  Filled 2024-06-23 (×2): qty 0.3

## 2024-06-23 MED ORDER — ONDANSETRON HCL 4 MG/2ML IJ SOLN: 4.0000 mg | Freq: Four times a day (QID) | INTRAMUSCULAR | Status: DC | PRN

## 2024-06-23 MED ORDER — SODIUM CHLORIDE 0.9% FLUSH
3.0000 mL | Freq: Two times a day (BID) | INTRAVENOUS | Status: DC
  Administered 2024-06-23 – 2024-06-24 (×3): 3 mL via INTRAVENOUS

## 2024-06-23 MED ORDER — ATORVASTATIN CALCIUM 40 MG PO TABS
40.0000 mg | ORAL_TABLET | Freq: Every day | ORAL | Status: DC
  Administered 2024-06-23 – 2024-06-24 (×2): 40 mg via ORAL
  Filled 2024-06-23 (×2): qty 1

## 2024-06-23 MED ORDER — MEMANTINE HCL 10 MG PO TABS
10.0000 mg | ORAL_TABLET | Freq: Two times a day (BID) | ORAL | Status: DC
  Administered 2024-06-23 – 2024-06-24 (×3): 10 mg via ORAL
  Filled 2024-06-23 (×3): qty 1

## 2024-06-23 MED ORDER — SODIUM CHLORIDE 0.9 % IV SOLN: INTRAVENOUS | Status: AC

## 2024-06-23 MED ORDER — SODIUM BICARBONATE 650 MG PO TABS
650.0000 mg | ORAL_TABLET | Freq: Two times a day (BID) | ORAL | Status: DC
  Administered 2024-06-23 – 2024-06-24 (×3): 650 mg via ORAL
  Filled 2024-06-23 (×3): qty 1

## 2024-06-23 MED ORDER — ONDANSETRON HCL 4 MG PO TABS
4.0000 mg | ORAL_TABLET | Freq: Four times a day (QID) | ORAL | Status: DC | PRN
Start: 2024-06-23 — End: 2024-06-24

## 2024-06-23 NOTE — Progress Notes (Signed)
 Echocardiogram 2D Echocardiogram has been performed.  Damien FALCON Rorik Vespa RDCS 06/23/2024, 11:35 AM

## 2024-06-23 NOTE — Evaluation (Signed)
 Physical Therapy Evaluation Patient Details Name: Erik Moss MRN: [id number] DOB: October 01, 1933 Today's Date: 06/23/2024  History of Present Illness  Pt is a a 89 y.o. male presented to ED by EMS 06/22/24 due to unresponsiveness x 30 minutes. CT head negative, urinalysis negative, +orthostatics; EEG normal  PMH: CVA, HTN, CKD stage IIIb, HLD, DM-2, dementia, BPH.  Clinical Impression  Pt admitted secondary to problem above with deficits below. PTA patient, pt reports with ?accuracy that he walked with RW vs rollator with supervision. Reports he was independent with bed mobility and transfers. Pt currently requires min assist for transfers and gait with RW x 80 ft. If pt has near 24/7 supervision and up to min assist at home, then he should be able to return home. However, it is unclear if he has this support and if not, will benefit from post-acute inpatient therapies <3 hrs/day. Patient was not orthostatic or symptomatic during session (see numbers below). Anticipate patient will benefit from PT to address problems listed below. Will continue to follow acutely to maximize functional mobility, independence, and safety.    Orthostatic BPs  Supine 107/56 HR 67  Sitting 104/58 HR71  Standing 112/52 HR 75  Pt asymptomatic and assisted to walk in hall. On return to sitting EOB, BP 110/54 HR 75          If plan is discharge home, recommend the following: A little help with walking and/or transfers;A little help with bathing/dressing/bathroom;Assistance with cooking/housework;Direct supervision/assist for medications management;Direct supervision/assist for financial management;Assist for transportation;Help with stairs or ramp for entrance;Supervision due to cognitive status   Can travel by private vehicle   Yes    Equipment Recommendations None recommended by PT  Recommendations for Other Services  OT consult    Functional Status Assessment Patient has had a recent decline in their  functional status and demonstrates the ability to make significant improvements in function in a reasonable and predictable amount of time.     Precautions / Restrictions Precautions Precautions: Fall;Other (comment) Recall of Precautions/Restrictions: Impaired Precaution/Restrictions Comments: orthostasis (asymptomatic)      Mobility  Bed Mobility Overal bed mobility: Needs Assistance Bed Mobility: Supine to Sit, Sit to Supine     Supine to sit: Supervision, HOB elevated Sit to supine: Supervision   General bed mobility comments: supervision for safety due to earlier orthostasis (asymptomatic)    Transfers Overall transfer level: Needs assistance Equipment used: Rolling walker (2 wheels), None Transfers: Sit to/from Stand Sit to Stand: Min assist           General transfer comment: from EOB: 1) to stand and use urinal, pt bracing legs against bed frame; 2) to RW without LE bracing    Ambulation/Gait Ambulation/Gait assistance: Min assist Gait Distance (Feet): 80 Feet Assistive device: Rolling walker (2 wheels) Gait Pattern/deviations: Step-through pattern, Trunk flexed   Gait velocity interpretation: 1.31 - 2.62 ft/sec, indicative of limited community ambulator   General Gait Details: pt able to correct proximity to RW and partially correct upright posture with cues; denied dizziness; min assist to turn RW 180 degree turn  Stairs            Wheelchair Mobility     Tilt Bed    Modified Rankin (Stroke Patients Only)       Balance Overall balance assessment: Needs assistance Sitting-balance support: No upper extremity supported, Feet supported Sitting balance-Leahy Scale: Fair     Standing balance support: No upper extremity supported, During functional activity Standing balance-Leahy Scale:  Poor Standing balance comment: leaning/bracing legs against bed                             Pertinent Vitals/Pain Pain Assessment Pain  Assessment: No/denies pain    Home Living Family/patient expects to be discharged to:: Private residence Living Arrangements: Children;Other relatives;Other (Comment) (grandaughter; prn housekeeper) Available Help at Discharge: Family;Friend(s) Type of Home: House Home Access: Stairs to enter Entrance Stairs-Rails: Right Entrance Stairs-Number of Steps: 2   Home Layout: One level Home Equipment: Agricultural consultant (2 wheels);Rollator (4 wheels);Wheelchair - manual;Hospital bed;BSC/3in1 Additional Comments: information from patient ?accurate    Prior Function Prior Level of Function : Patient poor historian/Family not available             Mobility Comments: reports walks with RW vs rollator ADLs Comments: reports housekeeper helps him shower     Extremity/Trunk Assessment   Upper Extremity Assessment Upper Extremity Assessment: Defer to OT evaluation    Lower Extremity Assessment Lower Extremity Assessment: Generalized weakness    Cervical / Trunk Assessment Cervical / Trunk Assessment: Kyphotic  Communication   Communication Communication: No apparent difficulties    Cognition Arousal: Lethargic Behavior During Therapy: Flat affect   PT - Cognitive impairments: History of cognitive impairments                       PT - Cognition Comments: seemingly answering appropriately (?accuracy) Following commands: Intact       Cueing Cueing Techniques: Verbal cues     General Comments      Exercises     Assessment/Plan    PT Assessment Patient needs continued PT services  PT Problem List Decreased strength;Decreased balance;Decreased mobility;Decreased cognition;Decreased knowledge of use of DME;Decreased safety awareness;Cardiopulmonary status limiting activity;Decreased knowledge of precautions       PT Treatment Interventions DME instruction;Gait training;Stair training;Functional mobility training;Therapeutic activities;Therapeutic exercise;Balance  training;Cognitive remediation;Patient/family education    PT Goals (Current goals can be found in the Care Plan section)  Acute Rehab PT Goals Patient Stated Goal: unable to state; agrees wants to keep his strength up PT Goal Formulation: With patient Time For Goal Achievement: 07/07/24 Potential to Achieve Goals: Good    Frequency Min 2X/week     Co-evaluation               AM-PAC PT 6 Clicks Mobility  Outcome Measure Help needed turning from your back to your side while in a flat bed without using bedrails?: None Help needed moving from lying on your back to sitting on the side of a flat bed without using bedrails?: A Little Help needed moving to and from a bed to a chair (including a wheelchair)?: A Little Help needed standing up from a chair using your arms (e.g., wheelchair or bedside chair)?: A Little Help needed to walk in hospital room?: A Little Help needed climbing 3-5 steps with a railing? : A Little 6 Click Score: 19    End of Session Equipment Utilized During Treatment: Gait belt Activity Tolerance: Patient tolerated treatment well Patient left: in bed;with call bell/phone within reach;with bed alarm set   PT Visit Diagnosis: Unsteadiness on feet (R26.81);Muscle weakness (generalized) (M62.81);Other symptoms and signs involving the nervous system (R29.898)    Time: 8545-8484 PT Time Calculation (min) (ACUTE ONLY): 21 min   Charges:   PT Evaluation $PT Eval Low Complexity: 1 Low   PT General Charges $$ ACUTE PT VISIT:  1 Visit          Macario RAMAN, PT Acute Rehabilitation Services  Office [phone]   Macario SHAUNNA Soja 06/23/2024, 4:00 PM

## 2024-06-23 NOTE — Procedures (Signed)
 Patient Name: Jashua Knaak Hakimi  MRN: [id number]  Epilepsy Attending: Arlin MALVA Krebs  Referring Physician/Provider: Samtani, Jai-Gurmukh, MD  Date: 06/23/2024 Duration: 22.44 mins  Patient history: 89yo M with an episode of unresponsiveness. EEG to evaluate for seizure  Level of alertness: Awake, asleep  AEDs during EEG study: None  Technical aspects: This EEG study was done with scalp electrodes positioned according to the 10-20 International system of electrode placement. Electrical activity was reviewed with band pass filter of 1-70Hz , sensitivity of 7 uV/mm, display speed of 29mm/sec with a 60Hz  notched filter applied as appropriate. EEG data were recorded continuously and digitally stored.  Video monitoring was available and reviewed as appropriate.  Description: The posterior dominant rhythm consists of 8 Hz activity of moderate voltage (25-35 uV) seen predominantly in posterior head regions, symmetric and reactive to eye opening and eye closing. Sleep was characterized by vertex waves, sleep spindles (12 to 14 Hz), maximal frontocentral region.  Hyperventilation and photic stimulation were not performed.     IMPRESSION: This study is within normal limits. No seizures or epileptiform discharges were seen throughout the recording.  A normal interictal EEG does not exclude the diagnosis of epilepsy.   Chestine Belknap O Selenne Coggin

## 2024-06-23 NOTE — Plan of Care (Signed)

## 2024-06-23 NOTE — Progress Notes (Addendum)
 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-TEE 9 did not confirm anything BPH Has Chronic anisocoria Admitted 11/15 through 09/14/2019 for with volume depletion and was recommended for outpatient palliative care-he was started on meds for dementia Namenda  and Seroquel  Patient was outside for some period of time ambulated to his porch became unresponsive-EMS was called pupils were dilated given bolus of fluids unclear if he was bagged by EMS-unclear if he has a seizure he was shaking-eventual GCS 14  Spoke with daughter Vina 8/31:-has some mild dementia-was sitting on porch, had assisted fall--eyes buldded, couldn't hear EMT/ Firetruck --couldn't walk Happened a couple days ago as well:-maybe Tuesday or wed--had his walker, trying to get him to bus, couldn't walk--sat on walker--going to daddy day care had to be rolled to the bus--at that time no tongue bite, no urinary incontinence was awake and alert Prior eye bulging--teeth chattering with hypoglycemia several years   Workup revealed CO2 16 BUN/creatinine 39/2.4 (his baseline is 36/2.3) LFTs normal CK 307 Troponin 9 with flat trend-EKG my over read PR 0.12 QRS axis -50 slight T wave peak V2 through 4 compared to prior EKG 09/11/2023 no significant change Hemoglobin 8.4 (baseline 10) WBC 8.5 platelet 294  CXR 1 cm nodular density over right lower lung indeterminate CT head without contrast no acute intracranial abnormality no acute ischemia    Assessment  & Plan :    Syncope?  Seizure DDx other Routine EEG, MRI brain pending--note orthostatics  were positive but wish to rule out other organic pathology H/o quite suggestive of volume depletion--note is on Myrbetriq  50 Flomax  0.4 Benadryl  as needed for allergies and Norvasc  5 all of which will be held for now--resume once we get back imaging and EEG Previous right thalamic infarct 2020 Probably multi-infarct dementia Resuming Crestor 40--- does not seem to be on either aspirin  or Plavix ?  Will inquire Resume Namenda  10 Repeat orthostatic vitals later today Stage IV CKD Not sure if candidate for renal replacement therapy outpatient discussion with his nephrologist/pace Continue bicarb 650 twice daily DM TY 2 A1c 8.4 unclear if sugar was checked when he was seen on the porch Hold Januvia 25 for now, continue sliding scale alone-sugars ranging 114-180 Monitor trends   Data Reviewed:   Sodium 140 potassium 4.8 BUN/creatinine 39/2.3 AST ALT 14/20 GFR 26 CK3 02 WBC 8.5 hemoglobin 10.3 platelet 293  DVT prophylaxis: Lovenox   Status is: Observation The patient will require care spanning > 2 midnights and should be moved to inpatient because:   Needs full workup  Extensively discussed plan with daughter Vina phone number [phone]     Current Dispo: Likely skilled     Subjective:    Tells me date time year place person but misses [place] 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 Does get a little bit confused in the details of things but remembers that he came here in 2020 he does not recall the reasoning (later is able to remember it was because of COVID after some prompting No specific  overt complaints-does not remember the events from yesterday when he came to the hospital   Objective + exam Vitals:   06/23/24 0145 06/23/24 0200 06/23/24 0300 06/23/24 0803  BP: (!) 134/100 (!) 146/85 (!) 160/73 132/69  Pulse: 71 76 71 67  Resp: 10 17 16    Temp:   98.6 F (37 C) 98 F (36.7 C)  TempSrc:   Oral   SpO2: 100% 100% 100% 98%   Weight:   61.6 kg   Height:   5' 6 (1.676 m)    Filed Weights   06/23/24 0300  Weight: 61.6 kg     Examination: EOMI NCAT arcus Synolis Slightly malnourished bitemporal wasting supraclavicular wasting Power 5/5 upper and lower extremities and hips biceps triceps knees ankle dorsi and plantar flexors Finger-nose-finger is intact on the left little bit off on the right side Vision by direct confrontation may be some right nasal quadrantanopsia but he corrects himself S1-S2 no murmur Abdomen soft Chest is clear No JVD Reflexes 2/3     Scheduled Meds:  enoxaparin  (LOVENOX ) injection  30 mg Subcutaneous Q24H   insulin  aspart  0-9 Units Subcutaneous TID WC   sodium chloride  flush  3 mL Intravenous Q12H   Continuous Infusions:  sodium chloride  100 mL/hr at 06/23/24 9362    Time 75  Colen Grimes, MD  [facility]

## 2024-06-23 NOTE — Progress Notes (Signed)
 Orthostatic Vitals:  Lying: BP- 139/72 HR-65 Sitting: BP: 123/66 HR: 67 Standing at 0 minutes: BP: 79/41 HR: 71  Patient was symptomatic during the process of obtaining vitals. Complaints of dizzy from patient and this nurse had to assist patient while standing

## 2024-06-23 NOTE — Progress Notes (Signed)
 STAT EEG complete. Results pending

## 2024-06-24 DIAGNOSIS — R404 Transient alteration of awareness: Secondary | ICD-10-CM | POA: Diagnosis not present

## 2024-06-24 LAB — CBC WITH DIFFERENTIAL/PLATELET
Abs Immature Granulocytes: 0.01 K/uL (ref 0.00–0.07)
Basophils Absolute: 0 K/uL (ref 0.0–0.1)
Basophils Relative: 1 %
Eosinophils Absolute: 0.3 K/uL (ref 0.0–0.5)
Eosinophils Relative: 5 %
HCT: 27.2 % — ABNORMAL LOW (ref 39.0–52.0)
Hemoglobin: 8.7 g/dL — ABNORMAL LOW (ref 13.0–17.0)
Immature Granulocytes: 0 %
Lymphocytes Relative: 41 %
Lymphs Abs: 2.5 K/uL (ref 0.7–4.0)
MCH: 30.6 pg (ref 26.0–34.0)
MCHC: 32 g/dL (ref 30.0–36.0)
MCV: 95.8 fL (ref 80.0–100.0)
Monocytes Absolute: 0.5 K/uL (ref 0.1–1.0)
Monocytes Relative: 9 %
Neutro Abs: 2.7 K/uL (ref 1.7–7.7)
Neutrophils Relative %: 44 %
Platelets: 250 K/uL (ref 150–400)
RBC: 2.84 MIL/uL — ABNORMAL LOW (ref 4.22–5.81)
RDW: 14.2 % (ref 11.5–15.5)
WBC: 6.1 K/uL (ref 4.0–10.5)
nRBC: 0 % (ref 0.0–0.2)

## 2024-06-24 LAB — BASIC METABOLIC PANEL WITH GFR
Anion gap: 11 (ref 5–15)
BUN: 38 mg/dL — ABNORMAL HIGH (ref 8–23)
CO2: 20 mmol/L — ABNORMAL LOW (ref 22–32)
Calcium: 8.6 mg/dL — ABNORMAL LOW (ref 8.9–10.3)
Chloride: 111 mmol/L (ref 98–111)
Creatinine, Ser: 2.4 mg/dL — ABNORMAL HIGH (ref 0.61–1.24)
GFR, Estimated: 25 mL/min — ABNORMAL LOW (ref >60)
Glucose, Bld: 107 mg/dL — ABNORMAL HIGH (ref 70–99)
Potassium: 4.8 mmol/L (ref 3.5–5.1)
Sodium: 142 mmol/L (ref 135–145)

## 2024-06-24 LAB — GLUCOSE, CAPILLARY: Glucose-Capillary: 93 mg/dL (ref 70–99)

## 2024-06-24 MED ORDER — SODIUM CHLORIDE 0.9 % IV SOLN: INTRAVENOUS | Status: DC

## 2024-06-24 MED ORDER — MIDODRINE HCL 5 MG PO TABS
5.0000 mg | ORAL_TABLET | Freq: Three times a day (TID) | ORAL | Status: DC
  Administered 2024-06-24: 5 mg via ORAL
  Filled 2024-06-24: qty 1

## 2024-06-24 MED ORDER — MIDODRINE HCL 5 MG PO TABS
5.0000 mg | ORAL_TABLET | Freq: Three times a day (TID) | ORAL | 0 refills | Status: AC
Start: 2024-06-24 — End: 2024-07-24

## 2024-06-24 MED ORDER — SODIUM CHLORIDE 0.9 % IV SOLN: INTRAVENOUS | Status: AC

## 2024-06-24 MED ORDER — AMLODIPINE BESYLATE 5 MG PO TABS: ORAL_TABLET | ORAL | Status: AC

## 2024-06-24 NOTE — Evaluation (Signed)
 Occupational Therapy Evaluation Patient Details Name: Erik Moss MRN: [id number] DOB: 18-Mar-1933 Today's Date: 06/24/2024   History of Present Illness   Pt is a a 89 y.o. male presented to ED by EMS 06/22/24 due to unresponsiveness x 30 minutes. CT head negative, urinalysis negative, +orthostatics; EEG normal  PMH: CVA, HTN, CKD stage IIIb, HLD, DM-2, dementia, BPH.     Clinical Impressions PTA Erik Moss lives with his daughter and goes to a memory daycare at [employer] daily from 8-4:30 (bus picks him up). At baseline, pt uses a rollator for mobility and is able to complete his ADL tasks however his daughter has recently hired a HHA to assist with self care. Spoke with daughter via phone who states she has arranged for someone to be with her father when he is at home. Acute OT to follow however do not anticipate the need for OT follow up. Discussed with MD &PT.  Orthostatic BPs  Recliner with feet up 116/59  Sitting 104/61     Standing 105/55  After mobility 98/54        If plan is discharge home, recommend the following:   A little help with walking and/or transfers;A little help with bathing/dressing/bathroom;Assistance with cooking/housework;Direct supervision/assist for medications management;Direct supervision/assist for financial management;Assist for transportation     Functional Status Assessment   Patient has had a recent decline in their functional status and demonstrates the ability to make significant improvements in function in a reasonable and predictable amount of time.     Equipment Recommendations   None recommended by OT     Recommendations for Other Services         Precautions/Restrictions   Precautions Precautions: Fall;Other (comment) Recall of Precautions/Restrictions: Impaired Precaution/Restrictions Comments: orthostasis (asymptomatic)     Mobility Bed Mobility               General bed mobility comments: OOB in  chair    Transfers Overall transfer level: Needs assistance Equipment used: Rolling walker (2 wheels), None Transfers: Sit to/from Stand Sit to Stand: Contact guard assist           General transfer comment: uses momentum; several attempts beofre standing I have to do that      Balance Overall balance assessment: Needs assistance Sitting-balance support: No upper extremity supported, Feet supported Sitting balance-Leahy Scale: Good     Standing balance support: No upper extremity supported, During functional activity Standing balance-Leahy Scale: Poor Standing balance comment: leaning/bracing legs against bed                           ADL either performed or assessed with clinical judgement   ADL Overall ADL's : Needs assistance/impaired Eating/Feeding: Set up   Grooming: Set up   Upper Body Bathing: Contact guard assist;Sitting   Lower Body Bathing: Contact guard assist;Sit to/from stand   Upper Body Dressing : Set up;Sitting   Lower Body Dressing: Contact guard assist;Sit to/from stand   Toilet Transfer: Contact guard assist;Ambulation;Rolling walker (2 wheels)   Toileting- Clothing Manipulation and Hygiene: Minimal assistance Toileting - Clothing Manipulation Details (indicate cue type and reason): incontinent at baseline; uses briefs     Functional mobility during ADLs: Contact guard assist;Rolling walker (2 wheels)       Vision Baseline Vision/History: 1 Wears glasses       Perception         Praxis         Pertinent Vitals/Pain  Pain Assessment Pain Assessment: No/denies pain     Extremity/Trunk Assessment Upper Extremity Assessment Upper Extremity Assessment: Overall WFL for tasks assessed;Generalized weakness   Lower Extremity Assessment Lower Extremity Assessment: Defer to PT evaluation   Cervical / Trunk Assessment Cervical / Trunk Assessment: Kyphotic   Communication Communication Communication: No apparent  difficulties   Cognition Arousal: Alert Behavior During Therapy: WFL for tasks assessed/performed Cognition: History of cognitive impairments (most likely close to baseline; abl eto give accurate information regarding his home; clarified wiht daughter via phone)                               Following commands: Intact       Cueing  General Comments   Cueing Techniques: Verbal cues  ambulated @ 137ft @ RW level   Exercises     Shoulder Instructions      Home Living Family/patient expects to be discharged to:: Private residence Living Arrangements: Children;Other relatives;Other (Comment) (grandaughter; prn housekeeper) Available Help at Discharge: Family;Friend(s) Type of Home: House Home Access: Stairs to enter [employer] of Steps: 2 Entrance Stairs-Rails: Right Home Layout: One level     Bathroom Shower/Tub: Producer, television/film/video: Standard Bathroom Accessibility: Yes How Accessible: Accessible via walker Home Equipment: Rolling Walker (2 wheels);Rollator (4 wheels);Wheelchair - manual;Hospital bed;BSC/3in1;Shower seat   Additional Comments: information from patient ?accurate      Prior Functioning/Environment               Mobility Comments: reports walks with rollator ADLs Comments: HHA helps with shower and dressing; takes a bus to daycare    OT Problem List: Decreased activity tolerance;Impaired balance (sitting and/or standing)   OT Treatment/Interventions: Self-care/ADL training;Therapeutic exercise;DME and/or AE instruction;Therapeutic activities;Patient/family education      OT Goals(Current goals can be found in the care plan section)   Acute Rehab OT Goals Patient Stated Goal: to go home OT Goal Formulation: With patient/family Time For Goal Achievement: 07/08/24 Potential to Achieve Goals: Good   OT Frequency:  Min 2X/week    Co-evaluation              AM-PAC OT 6 Clicks Daily Activity      Outcome Measure Help from another person eating meals?: A Little Help from another person taking care of personal grooming?: A Little Help from another person toileting, which includes using toliet, bedpan, or urinal?: A Little Help from another person bathing (including washing, rinsing, drying)?: A Little Help from another person to put on and taking off regular upper body clothing?: A Little Help from another person to put on and taking off regular lower body clothing?: A Little 6 Click Score: 18   End of Session Equipment Utilized During Treatment: Gait belt;Rolling walker (2 wheels) Nurse Communication: Mobility status  Activity Tolerance: Patient tolerated treatment well Patient left: in chair;with call bell/phone within reach;with chair alarm set  OT Visit Diagnosis: Unsteadiness on feet (R26.81)                Time: 8849-8783 OT Time Calculation (min): 26 min Charges:  OT General Charges $OT Visit: 1 Visit OT Evaluation $OT Eval Low Complexity: 1 Low OT Treatments $Self Care/Home Management : 8-22 mins  Kreg Sink, OT/L   Acute OT Clinical Specialist Acute Rehabilitation Services Pager [phone] Office [phone]   [facility] 06/24/2024, 12:52 PM

## 2024-06-24 NOTE — Progress Notes (Signed)
 Mobility Specialist: Progress Note   06/24/24 1522  Mobility  Activity Ambulated with assistance  Level of Assistance Contact guard assist, steadying assist  Assistive Device Front wheel walker  Distance Ambulated (ft) 200 ft  Activity Response Tolerated well  Mobility Referral Yes  Mobility visit 1 Mobility  Mobility Specialist Start Time (ACUTE ONLY) 1125  Mobility Specialist Stop Time (ACUTE ONLY) 1143  Mobility Specialist Time Calculation (min) (ACUTE ONLY) 18 min    Pt received in bed, agreeable to mobility session. SV for bed mobility. MinA for STS. MinG for ambulation, min verbal cues needed for RW proximity and steering. Impulsive at the beginning of session d/t BR urgency, void successful and pt required minA for pericare. Ambulated 200' and returned to room without fault. Left in chair with all needs met, call bell in reach. Chair alarm on.   Ileana Lute Mobility Specialist Please contact via SecureChat or Rehab office at [phone]

## 2024-06-24 NOTE — Progress Notes (Signed)
 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-TEE 9 did not confirm anything BPH Has Chronic anisocoria Admitted 11/15 through 09/14/2019 for with volume depletion and was recommended for outpatient palliative care-he was started on meds for dementia Namenda  and Seroquel  Patient was outside for some period of time ambulated to his porch became unresponsive-EMS was called pupils were dilated given bolus of fluids unclear if he was bagged by EMS-unclear if he has a seizure he was shaking-eventual GCS 14  Spoke with daughter Vina 8/31:-has some mild dementia-was sitting on porch, had assisted fall--eyes buldded, couldn't hear EMT/ Firetruck --couldn't walk Happened a couple days ago as well:-maybe Tuesday or wed--had his walker, trying to get him to bus, couldn't walk--sat on walker--[facility] had to be rolled to the bus--at that time no tongue bite, no urinary incontinence was awake and alert Prior eye bulging--teeth chattering with hypoglycemia several years   Workup revealed CO2 16 BUN/creatinine 39/2.4 (his baseline is 36/2.3) LFTs normal CK 307 Troponin 9 with flat trend-EKG my over read PR 0.12 QRS axis -50 slight T wave peak V2 through 4 compared to prior EKG 09/11/2023 no significant change Hemoglobin 8.4 (baseline 10) WBC 8.5 platelet 294  CXR 1 cm nodular density over right lower lung indeterminate CT head without contrast no acute intracranial abnormality no acute ischemia 8/31 EEG negative, MRI brain no organic pathology, EF 65-70% diastolic parameters indeterminant mild mitral valve  regurg circumferential small pericardial effusion    Assessment  & Plan :    Syncope?  Seizure DDx other Routine EEG, MRI brain echo do not suggest any organic cause Severely orthostatic based on PT/nursing notes 8/31 H/o also suggestive of volume depletion--Stopping Myrbetriq  50 Flomax  0.4 Benadryl   Placing on midodrine  5 3 times daily and ambulate today Needs skilled facility workup completed inpatient Previous right thalamic infarct 2020 Probably multi-infarct dementia Resuming Crestor 40--- May require blood thinner if can prevent falls Resume Namenda  10 Stage IV CKD Not sure if candidate for renal replacement therapy outpatient discussion with his nephrologist/pace Continue bicarb 650 twice daily DM TY 2 A1c 8.4 unclear if sugar was checked when he was seen on the porch Hold Januvia 25 for now, CBG ranging 93-1 07 stop sliding scale given no sugars above 180   Data Reviewed:   Sodium 142 potassium 4.8 BUN/creatinine 38/2.4 bicarb 20 WBC 6.1 hemoglobin 8.7 baseline seems to be in the 8 range  DVT prophylaxis: Lovenox   Status is: Observation The patient will require care spanning > 2 midnights and should be moved to inpatient because:   Needs full workup  Extensively discussed plan with daughter Vina phone number [phone] on 8/31 Await TOC input     Current Dispo: Likely skilled     Subjective:   Looks well feels fair much more clear mentally Needed to pass urine when he stood up --did not feel dizzy while standing  No fever no nausea no vomiting    Objective + exam Vitals:   06/24/24 0353 06/24/24 0404 06/24/24 0605 06/24/24 0842  BP: ROLLEN)  139/53  (!) 109/49 (!) 135/51  Pulse: 62  62 61  Resp: 17   18  Temp: 98.7 F (37.1 C)   98.5 F (36.9 C)  TempSrc:    Oral  SpO2: 97%  90% 98%  Weight:  63.9 kg    Height:       Filed Weights   06/23/24 0300 06/24/24 0404  Weight: 61.6 kg 63.9 kg     Examination:  Awake alert coherent CTAB no added  sound no rales no rhonchi S1-S2 no murmur Abdomen is soft no rebound no guarding Power is 5/5 bilaterally finger-nose-finger intact extraocular movements intact reflexes 2/3    Scheduled Meds:  atorvastatin   40 mg Oral Daily   enoxaparin  (LOVENOX ) injection  30 mg Subcutaneous Q24H   insulin  aspart  0-9 Units Subcutaneous TID WC   memantine   10 mg Oral BID   midodrine   5 mg Oral TID WC   sodium bicarbonate   650 mg Oral BID   sodium chloride  flush  3 mL Intravenous Q12H   Continuous Infusions:  sodium chloride  50 mL/hr at 06/24/24 9160    Time 25  Colen Grimes, MD  [facility]

## 2024-06-24 NOTE — Progress Notes (Signed)
 Physical Therapy Treatment Patient Details Name: Erik Moss MRN: [id number] DOB: 01-Mar-1933 Today's Date: 06/24/2024   History of Present Illness Pt is a a 89 y.o. male presented to ED by EMS 06/22/24 due to unresponsiveness x 30 minutes. CT head negative, urinalysis negative, +orthostatics; EEG normal  PMH: CVA, HTN, CKD stage IIIb, HLD, DM-2, dementia, BPH.    PT Comments  Pt received sitting up in chair; he is pleasant and follows one step commands. Donned brief with minA due to baseline incontinence. Pt supervision for transfers and ambulating household distance ~200 ft with a Rollator and CGA. BP post activity 107/67 (79), HR 78 with no complaints of dizziness. Pt attends day program at [facility] 8-4:30. Would likely benefit from OPPT for balance training to reduce fall risk and promote strengthening.    If plan is discharge home, recommend the following: A little help with walking and/or transfers;A little help with bathing/dressing/bathroom;Assistance with cooking/housework;Direct supervision/assist for medications management;Direct supervision/assist for financial management;Assist for transportation;Help with stairs or ramp for entrance;Supervision due to cognitive status   Can travel by private vehicle     Yes  Equipment Recommendations  None recommended by PT    Recommendations for Other Services       Precautions / Restrictions Precautions Precautions: Fall;Other (comment) Recall of Precautions/Restrictions: Impaired Precaution/Restrictions Comments: orthostasis (asymptomatic) Restrictions Weight Bearing Restrictions Per Provider Order: No     Mobility  Bed Mobility               General bed mobility comments: OOB in chair    Transfers Overall transfer level: Needs assistance Equipment used: Rolling walker (2 wheels), None Transfers: Sit to/from Stand Sit to Stand: Supervision, Contact guard assist           General transfer comment: Initial  stand was CGA, subsequent stand was supervision. Increased time. Does not reach back when transitioning to sitting    Ambulation/Gait Ambulation/Gait assistance: Contact guard assist Gait Distance (Feet): 200 Feet Assistive device: Rollator (4 wheels) Gait Pattern/deviations: Step-through pattern, Decreased stride length Gait velocity: decreased Gait velocity interpretation: <1.8 ft/sec, indicate of risk for recurrent falls   General Gait Details: Crouched posture with increased bilateral knee flexion in stance phase, verbal cues for proximity to Rollator.   Stairs             Wheelchair Mobility     Tilt Bed    Modified Rankin (Stroke Patients Only)       Balance Overall balance assessment: Needs assistance Sitting-balance support: No upper extremity supported, Feet supported Sitting balance-Leahy Scale: Good     Standing balance support: No upper extremity supported, During functional activity Standing balance-Leahy Scale: Poor Standing balance comment: leaning/bracing legs against bed                            Communication Communication Communication: No apparent difficulties  Cognition Arousal: Alert Behavior During Therapy: WFL for tasks assessed/performed   PT - Cognitive impairments: History of cognitive impairments                         Following commands: Intact      Cueing Cueing Techniques: Verbal cues  Exercises      General Comments        Pertinent Vitals/Pain Pain Assessment Pain Assessment: No/denies pain    Home Living  Prior Function            PT Goals (current goals can now be found in the care plan section) Acute Rehab PT Goals Potential to Achieve Goals: Good Progress towards PT goals: Progressing toward goals    Frequency    Min 2X/week      PT Plan      Co-evaluation              AM-PAC PT 6 Clicks Mobility   Outcome Measure  Help  needed turning from your back to your side while in a flat bed without using bedrails?: None Help needed moving from lying on your back to sitting on the side of a flat bed without using bedrails?: A Little Help needed moving to and from a bed to a chair (including a wheelchair)?: A Little Help needed standing up from a chair using your arms (e.g., wheelchair or bedside chair)?: A Little Help needed to walk in hospital room?: A Little Help needed climbing 3-5 steps with a railing? : A Little 6 Click Score: 19    End of Session Equipment Utilized During Treatment: Gait belt Activity Tolerance: Patient tolerated treatment well Patient left: in chair;with call bell/phone within reach;with chair alarm set;Other (comment) (pt wearing brief) Nurse Communication: Mobility status PT Visit Diagnosis: Unsteadiness on feet (R26.81);Muscle weakness (generalized) (M62.81);Other symptoms and signs involving the nervous system (R29.898)     Time: 8554-8496 PT Time Calculation (min) (ACUTE ONLY): 18 min  Charges:    $Therapeutic Activity: 8-22 mins PT General Charges $$ ACUTE PT VISIT: 1 Visit                     Aleck Daring, PT, DPT Acute Rehabilitation Services Office [phone]    Alayne ONEIDA Daring 06/24/2024, 3:39 PM

## 2024-06-24 NOTE — Discharge Summary (Signed)
 Physician Discharge Summary  Erik Moss FMW:[id number] DOB: 26-May-1933 DOA: 06/22/2024  PCP: Roanna Ezekiel NOVAK, MD  Admit date: 06/22/2024 Discharge date: 06/24/2024  Time spent: 44 minutes  Recommendations for Outpatient Follow-up:  Stopped Ozempic-stopped Norvasc  and changed it to as needed blood pressure >180 Needs outpatient evaluation for POTS syndrome versus volume depletion and would get Chem-12 in 1 week New medication midodrine  5 3 times daily can discontinue in 1 month if no recurrence  Discharge Diagnoses:  MAIN problem for hospitalization   Simple syncope with orthostasis severe  Please see below for itemized issues addressed in HOpsital- refer to other progress notes for clarity if needed  Discharge Condition: Improved  Diet recommendation: Regular  Filed Weights   06/23/24 0300 06/24/24 0404  Weight: 61.6 kg 63.9 kg    History of present illness:  89 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-TEE 9 did not confirm anything BPH Has Chronic anisocoria Admitted 11/15 through 09/14/2019 for with volume depletion and was recommended for outpatient palliative care-he was started on meds for dementia Namenda  and Seroquel  Patient was outside for some period of time ambulated to his porch became unresponsive-EMS was called pupils were dilated given bolus of fluids unclear if he was bagged by EMS-unclear if he has a seizure he was shaking-eventual GCS 14   Spoke with daughter Vina 8/31:-has some mild dementia-was sitting on porch, had assisted fall--eyes buldded, couldn't hear EMT/ Firetruck --couldn't walk Happened a couple days ago as well:-maybe Tuesday or wed--had his walker, trying to get him to bus, couldn't walk--sat on walker--[facility]  rolled to the bus--at that time no tongue bite, no urinary incontinence was awake and alert Prior eye bulging--teeth chattering with hypoglycemia several years     Workup revealed CO2 16 BUN/creatinine 39/2.4 (his baseline is 36/2.3) LFTs normal CK 307 Troponin 9 with flat trend-EKG my over read PR 0.12 QRS axis -50 slight T wave peak V2 through 4 compared to prior EKG 09/11/2023 no significant change Hemoglobin 8.4 (baseline 10) WBC 8.5 platelet 294  CXR 1 cm nodular density over right lower lung indeterminate CT head without contrast no acute intracranial abnormality no acute ischemia 8/31 EEG negative, MRI brain no organic pathology, EF 65-70% diastolic parameters indeterminant mild mitral valve regurg circumferential small pericardial effusion      Assessment  & Plan :      Syncope?  Seizure DDx other Routine EEG, MRI brain echo do not suggest any organic cause Severely orthostatic based on PT/nursing notes 8/31 H/o also suggestive of volume depletion--Stopping Myrbetriq  50 Benadryl   Placing on midodrine  5 3 times daily and ambulated Follow-up closely as an outpatient Previous right thalamic infarct 2020 Probably multi-infarct dementia Resuming Crestor 40--- May require blood thinner if can prevent falls Resume Namenda  10 BPH Myrbetriq  discontinued but Flomax  0.4 to be resumed as do not want him to retain urine He will need to continue to monitor pressures and be careful taking this Stage IV CKD Not sure if candidate for renal replacement therapy outpatient discussion with his nephrologist/pace Continue bicarb 650 twice daily DM TY 2 A1c 8.4 unclear if sugar was checked when he was seen on the porch Hold Januvia 25 for now,  No sure at the age of 68 if he is a good candidate  for Ozempic and would stop this completely do not want him to lose muscle mass  CBG ranging 93-1 07 stop sliding scale given no sugars above 180  Discharge Exam: Vitals:   06/24/24 0842 06/24/24 1100   BP: (!) 135/51 120/78  Pulse: 61   Resp: 18   Temp: 98.5 F (36.9 C)   SpO2: 98%     Subj on day of d/c   Awake coherent No big changes from yesterday No big changes from this morning He is moving around much better looks like with therapy  Discharge Instructions   Discharge Instructions     Diet - low sodium heart healthy   Complete by: As directed    Discharge instructions   Complete by: As directed    Please temporarily stop the Ozempic for now--u might have lost some amount of weight which could have caused water losses as well leading to some dehydration Do NOT take the amlodipine  at this time--you may take it if your blood pressure is over 180 systolic.  See the change in the orders I have started you, actually, on a medication that increases your blood pressure temporarily [for 30 days]  after you see your primary doc, would be a good idea to re-discuss this with them Get up slowly when u are getting up and take time going from laying to seated to standing Get some labs in 1 week   Increase activity slowly   Complete by: As directed       Allergies as of 06/24/2024   No Known Allergies      Medication List     STOP taking these medications    OZEMPIC (0.25 OR 0.5 MG/DOSE) [place]       TAKE these medications    amLODipine  5 MG tablet Commonly known as: NORVASC  Take only if BP is over 180 systolic What changed:  how much to take how to take this when to take this additional instructions   atorvastatin  40 MG tablet Commonly known as: Lipitor  Take 1 tablet (40 mg total) by mouth daily.   clopidogrel  75 MG tablet Commonly known as: PLAVIX  Take 75 mg by mouth daily.   D3 PO Take 1 capsule by mouth daily.   ezetimibe 10 MG tablet Commonly known as: ZETIA Take 10 mg by mouth daily.   IRON  PO Take 1 tablet by mouth every other day.   memantine  10 MG tablet Commonly known as: NAMENDA  TAKE ONE TABLET BY MOUTH TWICE DAILY   midodrine  5 MG  tablet Commonly known as: PROAMATINE  Take 1 tablet (5 mg total) by mouth 3 (three) times daily with meals.   polyethylene glycol 17 g packet Commonly known as: MIRALAX  / GLYCOLAX  Take 17 g by mouth daily.   senna 8.6 MG tablet Commonly known as: SENOKOT Take 1 tablet by mouth daily.   sitaGLIPtin 25 MG tablet Commonly known as: JANUVIA Take 25 mg by mouth daily.   sodium bicarbonate  650 MG tablet Take 650 mg by mouth 2 (two) times daily.   tamsulosin  0.4 MG Caps capsule Commonly known as: FLOMAX  Take 0.4 mg by mouth at bedtime.       No Known Allergies    The results of significant diagnostics from this hospitalization (including imaging, microbiology, ancillary and laboratory) are listed below for reference.    Significant Diagnostic Studies: ECHOCARDIOGRAM COMPLETE Result Date: 06/23/2024    ECHOCARDIOGRAM REPORT   Patient Name:   EARNEST MCGILLIS Date of Exam: 06/23/2024 Medical Rec #:  [id number]          Height:       66.0 in Accession #:    [id number]         Weight:       135.8 lb Date of Birth:  Jul 14, 1933          BSA:          1.696 m Patient Age:    89 years           BP:           132/69 mmHg Patient Gender: M                  HR:           70 bpm. Exam Location:  Inpatient Procedure: 2D Echo, Cardiac Doppler and Color Doppler (Both Spectral and Color            Flow Doppler were utilized during procedure). Indications:    R55 Syncope  History:        Patient has prior history of Echocardiogram examinations, most                 recent 10/10/2019. Risk Factors:Hypertension, Diabetes and                 Dyslipidemia.  Sonographer:    Damien Senior RDCS Referring Phys: [id number] BRADLY POUR STEPHENS IMPRESSIONS  1. Left ventricular ejection fraction, by estimation, is 65 to 70%. The left ventricle has normal function. The left ventricle has no regional wall motion abnormalities. Left ventricular diastolic parameters are indeterminate.  2. Right ventricular systolic function is  normal. The right ventricular size is normal. Tricuspid regurgitation signal is inadequate for assessing PA pressure.  3. A small pericardial effusion is present. The pericardial effusion is circumferential.  4. The mitral valve is normal in structure. Mild mitral valve regurgitation.  5. The aortic valve is tricuspid. Aortic valve regurgitation is not visualized. Aortic valve sclerosis/calcification is present, without any evidence of aortic stenosis.  6. The inferior vena cava is normal in size with <50% respiratory variability, suggesting right atrial pressure of 8 mmHg. FINDINGS  Left Ventricle: Left ventricular ejection fraction, by estimation, is 65 to 70%. The left ventricle has normal function. The left ventricle has no regional wall motion abnormalities. The left ventricular internal cavity size was normal in size. There is  no left ventricular hypertrophy. Left ventricular diastolic parameters are indeterminate. Right Ventricle: The right ventricular size is normal. No increase in right ventricular wall thickness. Right ventricular systolic function is normal. Tricuspid regurgitation signal is inadequate for assessing PA pressure. Left Atrium: Left atrial size was normal in size. Right Atrium: Right atrial size was normal in size. Pericardium: A small pericardial effusion is present. The pericardial effusion is circumferential. Mitral Valve: The mitral valve is normal in structure. Mild mitral valve regurgitation. Tricuspid Valve: The tricuspid valve is normal in structure. Tricuspid valve regurgitation is trivial. Aortic Valve: The aortic valve is tricuspid. Aortic valve regurgitation is not visualized. Aortic valve sclerosis/calcification is present, without any evidence of aortic stenosis. Pulmonic Valve: The pulmonic valve was grossly normal. Pulmonic valve regurgitation is trivial. Aorta: The aortic root and ascending aorta are structurally normal, with no evidence of dilitation. Venous: The inferior  vena cava is normal in size with less than 50% respiratory variability, suggesting right atrial pressure of 8 mmHg. IAS/Shunts: The atrial septum is grossly normal.  LEFT VENTRICLE PLAX 2D LVIDd:  3.90 cm   Diastology LVIDs:         2.10 cm   LV e' medial:    5.87 cm/s LV PW:         1.00 cm   LV E/e' medial:  16.8 LV IVS:        0.90 cm   LV e' lateral:   8.38 cm/s LVOT diam:     2.00 cm   LV E/e' lateral: 11.8 LV SV:         50 LV SV Index:   30 LVOT Area:     3.14 cm  RIGHT VENTRICLE RV S prime:     9.57 cm/s TAPSE (M-mode): 2.4 cm LEFT ATRIUM             Index        RIGHT ATRIUM           Index LA diam:        3.70 cm 2.18 cm/m   RA Area:     16.00 cm LA Vol (A2C):   44.1 ml 26.00 ml/m  RA Volume:   37.10 ml  21.87 ml/m LA Vol (A4C):   40.5 ml 23.87 ml/m LA Biplane Vol: 44.1 ml 26.00 ml/m  AORTIC VALVE LVOT Vmax:   66.00 cm/s LVOT Vmean:  45.050 cm/s LVOT VTI:    0.160 m  AORTA Ao Root diam: 2.80 cm Ao Asc diam:  3.40 cm MITRAL VALVE MV Area (PHT): 3.54 cm    SHUNTS MV Decel Time: 214 msec    Systemic VTI:  0.16 m MV E velocity: 98.80 cm/s  Systemic Diam: 2.00 cm MV A velocity: 87.30 cm/s MV E/A ratio:  1.13 Kardie Tobb DO Electronically signed by Dub Huntsman DO Signature Date/Time: 06/23/2024/1:21:42 PM    Final    MR BRAIN WO CONTRAST Result Date: 06/23/2024 CLINICAL DATA:  Seizure, new-onset, no history of trauma EXAM: MRI HEAD WITHOUT CONTRAST TECHNIQUE: Multiplanar, multiecho pulse sequences of the brain and surrounding structures were obtained without intravenous contrast. COMPARISON:  CT head 06/22/2024. MRI head September 08, 2023. FINDINGS: Brain: No acute infarction, hemorrhage, hydrocephalus, extra-axial collection or mass lesion. Mild for age patchy white matter T2/FLAIR hyperintensities, compatible with chronic microvascular ischemic disease. Cerebral atrophy. Vascular: Normal flow voids. Skull and upper cervical spine: Normal marrow signal. Sinuses/Orbits: Negative. Other: No  mastoid effusions. IMPRESSION: No evidence of acute intracranial abnormality. Electronically Signed   By: Gilmore GORMAN Molt M.D.   On: 06/23/2024 10:52   EEG adult Result Date: 06/23/2024 Shelton Arlin KIDD, MD     06/23/2024  9:16 AM Patient Name: Erik Moss MRN: [id number] Epilepsy Attending: Arlin KIDD Shelton Referring Physician/Provider: Audrey Thull, Jai-Gurmukh, MD Date: 06/23/2024 Duration: 22.44 mins Patient history: 89yo M with an episode of unresponsiveness Level of alertness: Awake, asleep AEDs during EEG study: None Technical aspects: This EEG study was done with scalp electrodes positioned according to the 10-20 International system of electrode placement. Electrical activity was reviewed with band pass filter of 1-70Hz , sensitivity of 7 uV/mm, display speed of 90mm/sec with a 60Hz  notched filter applied as appropriate. EEG data were recorded continuously and digitally stored.  Video monitoring was available and reviewed as appropriate. Description: The posterior dominant rhythm consists of 8 Hz activity of moderate voltage (25-35 uV) seen predominantly in posterior head regions, symmetric and reactive to eye opening and eye closing. Sleep was characterized by vertex waves, sleep spindles (12 to 14 Hz), maximal frontocentral region.  Hyperventilation and photic stimulation were  not performed.   IMPRESSION: This study is within normal limits. No seizures or epileptiform discharges were seen throughout the recording. A normal interictal EEG does not exclude the diagnosis of epilepsy. Priyanka O Yadav   CT Head Wo Contrast Result Date: 06/22/2024 CLINICAL DATA:  Seizure, new-onset, no history of trauma EXAM: CT HEAD WITHOUT CONTRAST TECHNIQUE: Contiguous axial images were obtained from the base of the skull through the vertex without intravenous contrast. RADIATION DOSE REDUCTION: This exam was performed according to the departmental dose-optimization program which includes automated exposure control,  adjustment of the mA and/or kV according to patient size and/or use of iterative reconstruction technique. COMPARISON:  Head CT 07/21/2023, brain MRI 09/08/2023 FINDINGS: Brain: No intracranial hemorrhage, mass effect, or midline shift. Stable atrophy and chronic small vessel ischemia. No hydrocephalus. The basilar cisterns are patent. No evidence of territorial infarct or acute ischemia. No extra-axial or intracranial fluid collection. Vascular: Atherosclerosis of skullbase vasculature without hyperdense vessel or abnormal calcification. Skull: No fracture or focal lesion. Sinuses/Orbits: No acute findings. Chronic opacification of right posterior ethmoid air cells. Bilateral cataract resection. Other: None. IMPRESSION: 1. No acute intracranial abnormality. 2. Stable atrophy and chronic small vessel ischemia. Electronically Signed   By: Andrea Gasman M.D.   On: 06/22/2024 18:54   DG Chest Port 1 View Result Date: 06/22/2024 CLINICAL DATA:  Syncope EXAM: PORTABLE CHEST 1 VIEW COMPARISON:  Chest x-ray 09/09/2023 FINDINGS: The heart size and mediastinal contours are within normal limits. 1 cm nodular density projects over the right lower lung, indeterminate. The lungs are otherwise clear. There is no pleural effusion or pneumothorax. The visualized skeletal structures are unremarkable. IMPRESSION: 1. 1 cm nodular density projects over the right lower lung, indeterminate. Recommend further evaluation with chest CT or repeat chest x-ray with nipple markers. 2. No acute cardiopulmonary process. Electronically Signed   By: Greig Pique M.D.   On: 06/22/2024 18:03    Microbiology: No results found for this or any previous visit (from the past 240 hours).   Labs: Basic Metabolic Panel: Recent Labs  Lab 06/22/24 1518 06/23/24 0612 06/24/24 0440  NA 139 140 142  K 3.9 4.8 4.8  CL 111 109 111  CO2 16* 20* 20*  GLUCOSE 182* 113* 107*  BUN 39* 39* 38*  CREATININE 2.41* 2.32* 2.40*  CALCIUM  8.2* 9.5  8.6*   Liver Function Tests: Recent Labs  Lab 06/22/24 1518 06/23/24 0612  AST 18 14*  ALT 19 20  ALKPHOS 70 87  BILITOT 0.3 0.5  PROT 5.3* 6.5  ALBUMIN 3.0* 3.5   No results for input(s): LIPASE, AMYLASE in the last 168 hours. No results for input(s): AMMONIA in the last 168 hours. CBC: Recent Labs  Lab 06/22/24 1518 06/23/24 0612 06/24/24 0440  WBC 5.3 8.5 6.1  NEUTROABS  --  4.4 2.7  HGB 8.4* 10.3* 8.7*  HCT 27.0* 31.3* 27.2*  MCV 100.4* 94.3 95.8  PLT 251 293 250   Cardiac Enzymes: Recent Labs  Lab 06/22/24 1659 06/23/24 0612  CKTOTAL 307 302   BNP: BNP (last 3 results) Recent Labs    10/06/23 2054  BNP 268.3*    ProBNP (last 3 results) No results for input(s): PROBNP in the last 8760 hours.  CBG: Recent Labs  Lab 06/22/24 1530 06/23/24 0804 06/23/24 1540 06/23/24 2151 06/24/24 0843  GLUCAP 176* 114* 162* 150* 93    Signed:  Colen Grimes MD   [facility] 06/24/2024, 5:20 PM

## 2024-06-24 NOTE — Plan of Care (Signed)
 Problem: Education: Goal: Knowledge of General Education information will improve Description: Including pain rating scale, medication(s)/side effects and non-pharmacologic comfort measures Outcome: Progressing   Problem: Health Behavior/Discharge Planning: Goal: Ability to manage health-related needs will improve Outcome: Progressing   Problem: Clinical Measurements: Goal: Ability to maintain clinical measurements within normal limits will improve Outcome: Progressing Goal: Will remain free from infection Outcome: Progressing Goal: Diagnostic test results will improve Outcome: Progressing Goal: Respiratory complications will improve Outcome: Progressing Goal: Cardiovascular complication will be avoided Outcome: Progressing   Problem: Activity: Goal: Risk for activity intolerance will decrease Outcome: Progressing   Problem: Nutrition: Goal: Adequate nutrition will be maintained Outcome: Progressing   Problem: Coping: Goal: Level of anxiety will decrease Outcome: Progressing   Problem: Elimination: Goal: Will not experience complications related to bowel motility Outcome: Progressing Goal: Will not experience complications related to urinary retention Outcome: Progressing   Problem: Pain Managment: Goal: General experience of comfort will improve and/or be controlled Outcome: Progressing   Problem: Safety: Goal: Ability to remain free from injury will improve Outcome: Progressing   Problem: Skin Integrity: Goal: Risk for impaired skin integrity will decrease Outcome: Progressing   Problem: Education: Goal: Ability to describe self-care measures that may prevent or decrease complications (Diabetes Survival Skills Education) will improve Outcome: Progressing Goal: Individualized Educational Video(s) Outcome: Progressing   Problem: Coping: Goal: Ability to adjust to condition or change in health will improve Outcome: Progressing   Problem: Fluid  Volume: Goal: Ability to maintain a balanced intake and output will improve Outcome: Progressing   Problem: Health Behavior/Discharge Planning: Goal: Ability to identify and utilize available resources and services will improve Outcome: Progressing Goal: Ability to manage health-related needs will improve Outcome: Progressing   Problem: Metabolic: Goal: Ability to maintain appropriate glucose levels will improve Outcome: Progressing   Problem: Nutritional: Goal: Maintenance of adequate nutrition will improve Outcome: Progressing Goal: Progress toward achieving an optimal weight will improve Outcome: Progressing   Problem: Skin Integrity: Goal: Risk for impaired skin integrity will decrease Outcome: Progressing   Problem: Tissue Perfusion: Goal: Adequacy of tissue perfusion will improve Outcome: Progressing   Problem: Education: Goal: Knowledge of condition and prescribed therapy will improve Outcome: Progressing   Problem: Cardiac: Goal: Will achieve and/or maintain adequate cardiac output Outcome: Progressing   Problem: Physical Regulation: Goal: Complications related to the disease process, condition or treatment will be avoided or minimized Outcome: Progressing   Problem: Education: Goal: Knowledge of condition and prescribed therapy will improve Outcome: Progressing   Problem: Cardiac: Goal: Will achieve and/or maintain adequate cardiac output Outcome: Progressing   Problem: Physical Regulation: Goal: Complications related to the disease process, condition or treatment will be avoided or minimized Outcome: Progressing   Problem: Education: Goal: Ability to describe self-care measures that may prevent or decrease complications (Diabetes Survival Skills Education) will improve Outcome: Progressing Goal: Individualized Educational Video(s) Outcome: Progressing   Problem: Coping: Goal: Ability to adjust to condition or change in health will improve Outcome:  Progressing   Problem: Fluid Volume: Goal: Ability to maintain a balanced intake and output will improve Outcome: Progressing   Problem: Health Behavior/Discharge Planning: Goal: Ability to identify and utilize available resources and services will improve Outcome: Progressing   Problem: Metabolic: Goal: Ability to maintain appropriate glucose levels will improve Outcome: Progressing   Problem: Nutritional: Goal: Maintenance of adequate nutrition will improve Outcome: Progressing Goal: Progress toward achieving an optimal weight will improve Outcome: Progressing   Problem: Tissue Perfusion: Goal: Adequacy of tissue  perfusion will improve Outcome: Progressing

## 2024-06-24 NOTE — Plan of Care (Signed)

## 2024-07-23 ENCOUNTER — Emergency Department (HOSPITAL_COMMUNITY)

## 2024-07-23 ENCOUNTER — Encounter (HOSPITAL_COMMUNITY): Payer: Self-pay | Admitting: Emergency Medicine

## 2024-07-23 ENCOUNTER — Other Ambulatory Visit: Payer: Self-pay

## 2024-07-23 ENCOUNTER — Emergency Department (HOSPITAL_COMMUNITY)
Admission: EM | Admit: 2024-07-23 | Discharge: 2024-07-25 | Disposition: A | Discharge status: Home / Self Care | Attending: Emergency Medicine | Admitting: Emergency Medicine

## 2024-07-23 DIAGNOSIS — R296 Repeated falls: Secondary | ICD-10-CM | POA: Diagnosis not present

## 2024-07-23 DIAGNOSIS — S7002XA Contusion of left hip, initial encounter: Secondary | ICD-10-CM | POA: Diagnosis not present

## 2024-07-23 DIAGNOSIS — R531 Weakness: Secondary | ICD-10-CM | POA: Insufficient documentation

## 2024-07-23 DIAGNOSIS — S79912A Unspecified injury of left hip, initial encounter: Secondary | ICD-10-CM | POA: Diagnosis present

## 2024-07-23 DIAGNOSIS — W19XXXA Unspecified fall, initial encounter: Secondary | ICD-10-CM | POA: Insufficient documentation

## 2024-07-23 DIAGNOSIS — F0390 Unspecified dementia without behavioral disturbance: Secondary | ICD-10-CM | POA: Diagnosis not present

## 2024-07-23 DIAGNOSIS — R410 Disorientation, unspecified: Secondary | ICD-10-CM | POA: Insufficient documentation

## 2024-07-23 LAB — COMPREHENSIVE METABOLIC PANEL WITH GFR
ALT: 35 U/L (ref 0–44)
AST: 30 U/L (ref 15–41)
Albumin: 3.5 g/dL (ref 3.5–5.0)
Alkaline Phosphatase: 82 U/L (ref 38–126)
Anion gap: 12 (ref 5–15)
BUN: 52 mg/dL — ABNORMAL HIGH (ref 8–23)
CO2: 23 mmol/L (ref 22–32)
Calcium: 9.2 mg/dL (ref 8.9–10.3)
Chloride: 101 mmol/L (ref 98–111)
Creatinine, Ser: 2.8 mg/dL — ABNORMAL HIGH (ref 0.61–1.24)
GFR, Estimated: 21 mL/min — ABNORMAL LOW (ref >60)
Glucose, Bld: 210 mg/dL — ABNORMAL HIGH (ref 70–99)
Potassium: 4.6 mmol/L (ref 3.5–5.1)
Sodium: 136 mmol/L (ref 135–145)
Total Bilirubin: 0.4 mg/dL (ref 0.0–1.2)
Total Protein: 6.4 g/dL — ABNORMAL LOW (ref 6.5–8.1)

## 2024-07-23 LAB — CBC
HCT: 30.8 % — ABNORMAL LOW (ref 39.0–52.0)
Hemoglobin: 9.9 g/dL — ABNORMAL LOW (ref 13.0–17.0)
MCH: 30.7 pg (ref 26.0–34.0)
MCHC: 32.1 g/dL (ref 30.0–36.0)
MCV: 95.7 fL (ref 80.0–100.0)
Platelets: 330 K/uL (ref 150–400)
RBC: 3.22 MIL/uL — ABNORMAL LOW (ref 4.22–5.81)
RDW: 13.8 % (ref 11.5–15.5)
WBC: 6.1 K/uL (ref 4.0–10.5)
nRBC: 0 % (ref 0.0–0.2)

## 2024-07-23 LAB — CBG MONITORING, ED: Glucose-Capillary: 201 mg/dL — ABNORMAL HIGH (ref 70–99)

## 2024-07-23 NOTE — ED Provider Triage Note (Signed)
 Emergency Medicine Provider Triage Evaluation Note  Erik Moss , a 89 y.o. male  was evaluated in triage.  Pt complains of recurrent falls. Increasing falls for the past several months, last fall was today, c/o L hip pain.  Also increased confusion, increase urinary frequency.  No report of head injury, n/v/d  Review of Systems  Positive: As above Negative: As above  Physical Exam  BP (!) 141/69 (BP Location: Right Arm)   Pulse 85   Temp 98.6 F (37 C)   Resp 17   Ht 5' 6 (1.676 m)   Wt 63 kg   SpO2 100%   BMI 22.42 kg/m  Gen:   Awake, no distress   Resp:  Normal effort  MSK:   Moves extremities without difficulty  Other:    Medical Decision Making  Medically screening exam initiated at 9:12 PM.  Appropriate orders placed.  Erik Moss was informed that the remainder of the evaluation will be completed by another provider, this initial triage assessment does not replace that evaluation, and the importance of remaining in the ED until their evaluation is complete.     Nivia Colon, PA-C 07/23/24 2113

## 2024-07-23 NOTE — ED Triage Notes (Signed)
 Patient from home accompanied by daughter with complaints of left hip pain, increased falls with ambulation, has fallen multiple times trying to get to the bathroom. This has progressively gotten worse over the last month. He fell twice yesterday and once on Sunday. Daughter reports increased progression of confusion, patient wandering outside the home and falling.

## 2024-07-24 ENCOUNTER — Emergency Department (HOSPITAL_COMMUNITY)

## 2024-07-24 LAB — URINALYSIS, ROUTINE W REFLEX MICROSCOPIC
Bacteria, UA: NONE SEEN
Bilirubin Urine: NEGATIVE
Glucose, UA: 50 mg/dL — AB
Hgb urine dipstick: NEGATIVE
Ketones, ur: NEGATIVE mg/dL
Leukocytes,Ua: NEGATIVE
Nitrite: NEGATIVE
Protein, ur: 30 mg/dL — AB
Specific Gravity, Urine: 1.019 (ref 1.005–1.030)
pH: 5 (ref 5.0–8.0)

## 2024-07-24 LAB — CBG MONITORING, ED
Glucose-Capillary: 120 mg/dL — ABNORMAL HIGH (ref 70–99)
Glucose-Capillary: 191 mg/dL — ABNORMAL HIGH (ref 70–99)

## 2024-07-24 MED ORDER — MIDODRINE HCL 5 MG PO TABS
5.0000 mg | ORAL_TABLET | Freq: Three times a day (TID) | ORAL | Status: DC
  Administered 2024-07-24 – 2024-07-25 (×3): 5 mg via ORAL
  Filled 2024-07-24 (×3): qty 1

## 2024-07-24 MED ORDER — POLYETHYLENE GLYCOL 3350 17 G PO PACK
17.0000 g | PACK | Freq: Every day | ORAL | Status: DC
Start: 2024-07-24 — End: 2024-07-25
  Administered 2024-07-24 – 2024-07-25 (×2): 17 g via ORAL
  Filled 2024-07-24 (×2): qty 1

## 2024-07-24 MED ORDER — CLOPIDOGREL BISULFATE 75 MG PO TABS
75.0000 mg | ORAL_TABLET | Freq: Every day | ORAL | Status: DC
  Administered 2024-07-24 – 2024-07-25 (×2): 75 mg via ORAL
  Filled 2024-07-24 (×2): qty 1

## 2024-07-24 MED ORDER — MEMANTINE HCL 10 MG PO TABS
10.0000 mg | ORAL_TABLET | Freq: Two times a day (BID) | ORAL | Status: DC
Start: 2024-07-24 — End: 2024-07-25
  Administered 2024-07-24 – 2024-07-25 (×3): 10 mg via ORAL
  Filled 2024-07-24 (×3): qty 1

## 2024-07-24 MED ORDER — ATORVASTATIN CALCIUM 40 MG PO TABS
40.0000 mg | ORAL_TABLET | Freq: Every day | ORAL | Status: DC
  Administered 2024-07-24 – 2024-07-25 (×2): 40 mg via ORAL
  Filled 2024-07-24 (×2): qty 1

## 2024-07-24 MED ORDER — AMLODIPINE BESYLATE 5 MG PO TABS
5.0000 mg | ORAL_TABLET | Freq: Every day | ORAL | Status: DC
  Administered 2024-07-24 – 2024-07-25 (×2): 5 mg via ORAL
  Filled 2024-07-24 (×2): qty 1

## 2024-07-24 MED ORDER — TAMSULOSIN HCL 0.4 MG PO CAPS
0.4000 mg | ORAL_CAPSULE | Freq: Every day | ORAL | Status: DC
  Administered 2024-07-24: 0.4 mg via ORAL
  Filled 2024-07-24: qty 1

## 2024-07-24 MED ORDER — EZETIMIBE 10 MG PO TABS
10.0000 mg | ORAL_TABLET | Freq: Every day | ORAL | Status: DC
  Administered 2024-07-24 – 2024-07-25 (×2): 10 mg via ORAL
  Filled 2024-07-24 (×2): qty 1

## 2024-07-24 MED ORDER — SENNA 8.6 MG PO TABS
1.0000 | ORAL_TABLET | Freq: Every day | ORAL | Status: DC
Start: 2024-07-24 — End: 2024-07-25
  Administered 2024-07-24 – 2024-07-25 (×2): 8.6 mg via ORAL
  Filled 2024-07-24 (×2): qty 1

## 2024-07-24 MED ORDER — SODIUM BICARBONATE 650 MG PO TABS
650.0000 mg | ORAL_TABLET | Freq: Two times a day (BID) | ORAL | Status: DC
Start: 2024-07-24 — End: 2024-07-25
  Administered 2024-07-24 – 2024-07-25 (×3): 650 mg via ORAL
  Filled 2024-07-24 (×3): qty 1

## 2024-07-24 NOTE — ED Notes (Signed)
 Patient transported to CT

## 2024-07-24 NOTE — ED Notes (Signed)
 Pt placed on hospital bed

## 2024-07-24 NOTE — ED Provider Notes (Signed)
 [place] EMERGENCY DEPARTMENT AT [facility] Provider Note   CSN: [id number] Arrival date & time: 07/23/24  2034     Patient presents with: Left Hip Pain  and Fall   Erik Moss is a 89 y.o. male.   Presents to the emergency department for evaluation of hip pain after a fall, generalized weakness, increased confusion.  Patient with history of dementia.  Patient goes to a daycare during the day.  He reportedly has been more confused than usual and has been complaining of left hip pain.  Patient accompanied by his daughter who reports that he has very limited ambulation ability, tries to walk sometimes and falls.       Prior to Admission medications   Medication Sig Start Date End Date Taking? Authorizing Provider  amLODipine  (NORVASC ) 5 MG tablet Take only if BP is over 180 systolic 06/24/24   Samtani, Jai-Gurmukh, MD  atorvastatin  (LIPITOR ) 40 MG tablet Take 1 tablet (40 mg total) by mouth daily. 10/10/19 06/24/24  [place], [name], DO  Cholecalciferol (D3 PO) Take 1 capsule by mouth daily.    [provider]  clopidogrel  (PLAVIX ) 75 MG tablet Take 75 mg by mouth daily. 06/22/24   [provider]  ezetimibe (ZETIA) 10 MG tablet Take 10 mg by mouth daily.    [provider]  Ferrous Sulfate (IRON  PO) Take 1 tablet by mouth every other day.    [provider]  memantine  (NAMENDA ) 10 MG tablet TAKE ONE TABLET BY MOUTH TWICE DAILY 11/09/23   Whitfield Raisin, NP  midodrine  (PROAMATINE ) 5 MG tablet Take 1 tablet (5 mg total) by mouth 3 (three) times daily with meals. 06/24/24 07/24/24  Samtani, Jai-Gurmukh, MD  polyethylene glycol (MIRALAX  / GLYCOLAX ) 17 g packet Take 17 g by mouth daily.    [provider]  senna (SENOKOT) 8.6 MG tablet Take 1 tablet by mouth daily.    [provider]  sitaGLIPtin (JANUVIA) 25 MG tablet Take 25 mg by mouth daily.    [provider]  sodium bicarbonate  650 MG tablet Take 650 mg by mouth  2 (two) times daily.    [provider]  tamsulosin  (FLOMAX ) 0.4 MG CAPS capsule Take 0.4 mg by mouth at bedtime. 08/02/23   [provider]    Allergies: Patient has no known allergies.    Review of Systems  Updated Vital Signs BP 137/71   Pulse 67   Temp 98.2 F (36.8 C) (Oral)   Resp 14   Ht 5' 6 (1.676 m)   Wt 63 kg   SpO2 100%   BMI 22.42 kg/m   Physical Exam Vitals and nursing note reviewed.  Constitutional:      General: He is not in acute distress.    Appearance: He is well-developed.  HENT:     Head: Normocephalic and atraumatic.     Mouth/Throat:     Mouth: Mucous membranes are moist.  Eyes:     General: Vision grossly intact. Gaze aligned appropriately.     Extraocular Movements: Extraocular movements intact.     Conjunctiva/sclera: Conjunctivae normal.  Cardiovascular:     Rate and Rhythm: Normal rate and regular rhythm.     Pulses: Normal pulses.     Heart sounds: Normal heart sounds, S1 normal and S2 normal. No murmur heard.    No friction rub. No gallop.  Pulmonary:     Effort: Pulmonary effort is normal. No respiratory distress.     Breath  sounds: Normal breath sounds.  Abdominal:     Palpations: Abdomen is soft.     Tenderness: There is no abdominal tenderness. There is no guarding or rebound.     Hernia: No hernia is present.  Musculoskeletal:        General: No swelling.     Cervical back: Full passive range of motion without pain, normal range of motion and neck supple. No pain with movement, spinous process tenderness or muscular tenderness. Normal range of motion.     Left hip: Tenderness present. No deformity. Normal range of motion.     Right lower leg: No edema.     Left lower leg: No edema.       Legs:  Skin:    General: Skin is warm and dry.     Capillary Refill: Capillary refill takes less than 2 seconds.     Findings: No ecchymosis, erythema, lesion or wound.  Neurological:     Mental Status: He is alert and  oriented to person, place, and time.     GCS: GCS eye subscore is 4. GCS verbal subscore is 5. GCS motor subscore is 6.     Cranial Nerves: Cranial nerves 2-12 are intact.     Sensory: Sensation is intact.     Motor: Motor function is intact. No weakness or abnormal muscle tone.     Coordination: Coordination is intact.  Psychiatric:        Mood and Affect: Mood normal.        Speech: Speech normal.        Behavior: Behavior normal.     (all labs ordered are listed, but only abnormal results are displayed) Labs Reviewed  COMPREHENSIVE METABOLIC PANEL WITH GFR - Abnormal; Notable for the following components:      Result Value   Glucose, Bld 210 (*)    BUN 52 (*)    Creatinine, Ser 2.80 (*)    Total Protein 6.4 (*)    GFR, Estimated 21 (*)    All other components within normal limits  CBC - Abnormal; Notable for the following components:   RBC 3.22 (*)    Hemoglobin 9.9 (*)    HCT 30.8 (*)    All other components within normal limits  URINALYSIS, ROUTINE W REFLEX MICROSCOPIC - Abnormal; Notable for the following components:   Glucose, UA 50 (*)    Protein, ur 30 (*)    All other components within normal limits  CBG MONITORING, ED - Abnormal; Notable for the following components:   Glucose-Capillary 201 (*)    All other components within normal limits    EKG: EKG Interpretation Date/Time:  Tuesday July 23 2024 21:30:25 EDT Ventricular Rate:  80 PR Interval:  242 QRS Duration:  84 QT Interval:  374 QTC Calculation: 431 R Axis:   -63  Text Interpretation: Sinus rhythm with 1st degree A-V block Left anterior fascicular block Anterior infarct , age undetermined Abnormal ECG When compared with ECG of 22-Jun-2024 15:14, No acute changes Confirmed by Haze Lonni PARAS [phone]) on 07/24/2024 12:04:10 AM  Radiology: CT Hip Left Wo Contrast Result Date: 07/24/2024 CLINICAL DATA:  Frequent falls, left hip pain.  Negative x-ray. EXAM: CT OF THE LEFT HIP WITHOUT CONTRAST  TECHNIQUE: Multidetector CT imaging of the left hip was performed according to the standard protocol. Multiplanar CT image reconstructions were also generated. RADIATION DOSE REDUCTION: This exam was performed according to the departmental dose-optimization program which includes automated exposure control, adjustment of the  mA and/or kV according to patient size and/or use of iterative reconstruction technique. COMPARISON:  Only comparison is left hip series from yesterday, with AP pelvis. FINDINGS: Bones/Joint/Cartilage There is no evidence of fractures of the sacrum, coccyx and visualized bony pelvis. The proximal left femur is intact. There is partial joint space loss at the left hip with small acetabular spurs. There is spurring of both SI joints and symphysis pubis. There are advanced degenerative changes of the lowest 2 lumbar disc levels with acquired spinal stenosis. No primary pathologic bone lesion is seen. Incidentally noted, there is a healed fracture deformity of the first coccygeal segment. Ligaments Suboptimally assessed by CT. Muscles and Tendons There are dystrophic calcifications in the proximal adductor longus tendons, probably due to old trauma. Swelling and edema noted in left iliopsoas muscle, could be trauma related or due to myositis/myotendinitis. There is normal muscle bulk for age. No intramuscular fluid collections or masses are seen. Soft tissues No pelvic mass, free air or fluid collection. Enlarged prostate impresses into the posterior bladder. The bladder unremarkable for the degree of distention. Both testicles are in the scrotal sac. There is moderate iliofemoral calcific arteriosclerosis. No aneurysm is seen. IMPRESSION: 1. No evidence of fractures of the sacrum, coccyx and visualized bony pelvis. 2. Swelling and edema in the left iliopsoas muscle, could be trauma related or due to myositis/myotendinitis. 3. Advanced degenerative changes of the lowest 2 lumbar disc levels with  acquired spinal stenosis. 4. Enlarged prostate. 5. Moderate iliofemoral calcific arteriosclerosis. 6. Dystrophic calcifications in the proximal adductor longus tendons, probably due to old trauma. Electronically Signed   By: Francis Quam M.D.   On: 07/24/2024 04:10   CT HEAD WO CONTRAST Result Date: 07/23/2024 CLINICAL DATA:  Head trauma EXAM: CT HEAD WITHOUT CONTRAST TECHNIQUE: Contiguous axial images were obtained from the base of the skull through the vertex without intravenous contrast. RADIATION DOSE REDUCTION: This exam was performed according to the departmental dose-optimization program which includes automated exposure control, adjustment of the mA and/or kV according to patient size and/or use of iterative reconstruction technique. COMPARISON:  MRI 06/23/2024, CT brain 06/22/2024 FINDINGS: Brain: No acute territorial infarction, hemorrhage or intracranial mass. Moderate to advanced atrophy. Mild chronic small vessel ischemic changes of the white matter. Stable ventricle size Vascular: No hyperdense vessels.  Carotid vascular calcification Skull: Normal. Negative for fracture or focal lesion. Sinuses/Orbits: Mucosal thickening in the sinuses Other: None IMPRESSION: 1. No CT evidence for acute intracranial abnormality. 2. Atrophy and chronic small vessel ischemic changes of the white matter. Electronically Signed   By: Luke Bun M.D.   On: 07/23/2024 22:14   DG Hip Unilat W or Wo Pelvis 2-3 Views Left Result Date: 07/23/2024 CLINICAL DATA:  Multiple falls in the last few months.  Pain. EXAM: DG HIP (WITH OR WITHOUT PELVIS) 2-3V LEFT COMPARISON:  None Available. FINDINGS: No acute fracture of the pelvis or left hip. There may be remote fractures of the left superior and inferior pubic rami. No pubic symphyseal or sacroiliac diastasis. No hip dislocation. Bilateral hip joint space narrowing and acetabular spurring, right greater than left. Prominent vascular calcifications. IMPRESSION: 1. No acute  fracture of the pelvis or left hip. 2. Bilateral hip osteoarthritis, right greater than left. Electronically Signed   By: Andrea Gasman M.D.   On: 07/23/2024 22:11     Procedures   Medications Ordered in the ED - No data to display  Medical Decision Making Amount and/or Complexity of Data Reviewed Radiology: ordered.   Differential diagnosis considered includes, but not limited to: Hip fracture; contusion; dementia; dehydration; UTI  Patient presents for complaints of hip pain.  Patient accompanied by his daughter.  Patient has significant gait instability, has had frequent falls.  Daughter reports that he went to adult daycare today and it was reported that he seemed more confused than usual and also had been complaining of left hip pain.  Daughter brings him in to be evaluated.  Patient does not appear to be in any distress at arrival.  Vital signs are unremarkable.  Patient indicates pain more on the lateral aspect of the left hip than in the actual hip itself.  X-ray and CT of hip unremarkable.  CT head unremarkable.  Lab work including urinalysis does not show any significant abnormality.  Will hold patient until morning to further evaluate for home needs, daughter and patient would consider placement.     Final diagnoses:  Frequent falls  Contusion of left hip, initial encounter    ED Discharge Orders     None          Jonavan Vanhorn, Lonni PARAS, MD 07/24/24 [phone]

## 2024-07-24 NOTE — [place] FL2 (Signed)
 [place]  MEDICAID FL2 LEVEL OF CARE FORM     IDENTIFICATION  Patient Name: Erik Moss Birthdate: [birth year] Sex: male Admission Date (Current Location): 07/23/2024  [facility] and [place] Number:  Producer, television/film/video and Address:  [facility] [place], [address] [place], [place] [address]      Provider Number: [id number]  Attending Physician Name and Address:  [name], MD  Relative Name and Phone Number:  Trayon, Krantz (Daughter)  [phone] [facility])    Current Level of Care: Hospital Recommended Level of Care: Skilled Nursing Facility Prior Approval Number:    Date Approved/Denied:   PASRR Number: Pending  Discharge Plan: SNF    Current Diagnoses: Patient Active Problem List   Diagnosis Date Noted   Generalized weakness 06/23/2024   Unresponsive episode 06/22/2024   Hypernatremia 09/09/2023   Dementia without behavioral disturbance, psychotic disturbance, mood disturbance, or anxiety (HCC) 11/30/2021   Urinary incontinence 11/30/2021   Gait abnormality 11/30/2021   Dementia without behavioral disturbance (HCC) 02/23/2021   Cerebrovascular accident (CVA) (HCC) 02/23/2021   Hyperglycemia due to diabetes mellitus (HCC) 09/24/2020   Acute CVA (cerebrovascular accident) (HCC) 10/08/2019   Type 2 diabetes mellitus (HCC) 10/08/2019   Hyperlipidemia 10/08/2019   BPH (benign prostatic hyperplasia) 10/08/2019   Hypertension 10/08/2019   ARF (acute renal failure) 05/08/2019    Orientation RESPIRATION BLADDER Height & Weight     Self, Place  Normal Incontinent Weight: 138 lb 14.2 oz (63 kg) Height:  5' 6 (167.6 cm)  BEHAVIORAL SYMPTOMS/MOOD NEUROLOGICAL BOWEL NUTRITION STATUS      Continent Diet (see d/c summary)  AMBULATORY STATUS COMMUNICATION OF NEEDS Skin   Extensive Assist Verbally Normal                       Personal Care Assistance Level of Assistance  Bathing, Feeding, Dressing Bathing Assistance: Maximum  assistance Feeding assistance: Independent Dressing Assistance: Maximum assistance     Functional Limitations Info  Sight, Hearing, Speech Sight Info: Adequate Hearing Info: Adequate Speech Info: Adequate    SPECIAL CARE FACTORS FREQUENCY  PT (By licensed PT), OT (By licensed OT)     PT Frequency: 5x/week OT Frequency: 5x/week            Contractures Contractures Info: Not present    Additional Factors Info  Code Status, Allergies Code Status Info: Full code Allergies Info: no known allergies           Current Medications (07/24/2024):  This is the current hospital active medication list Current Facility-Administered Medications  Medication Dose Route Frequency Provider Last Rate Last Admin   amLODipine  (NORVASC ) tablet 5 mg  5 mg Oral Daily Stanek, Lawrence S, MD       atorvastatin  (LIPITOR ) tablet 40 mg  40 mg Oral Daily Stanek, Lawrence S, MD       clopidogrel  (PLAVIX ) tablet 75 mg  75 mg Oral Daily Stanek, Lawrence S, MD       ezetimibe (ZETIA) tablet 10 mg  10 mg Oral Daily Stanek, Lawrence S, MD       memantine  (NAMENDA ) tablet 10 mg  10 mg Oral BID Stanek, Lawrence S, MD       midodrine  (PROAMATINE ) tablet 5 mg  5 mg Oral TID WC Stanek, Lawrence S, MD       polyethylene glycol (MIRALAX  / GLYCOLAX ) packet 17 g  17 g Oral Daily Stanek, Lawrence S, MD       senna (  SENOKOT) tablet 8.6 mg  1 tablet Oral Daily Stanek, Lawrence S, MD       sodium bicarbonate  tablet 650 mg  650 mg Oral BID Stanek, Lawrence S, MD       tamsulosin  (FLOMAX ) capsule 0.4 mg  0.4 mg Oral QHS [name], MD       Current Outpatient Medications  Medication Sig Dispense Refill   amLODipine  (NORVASC ) 5 MG tablet Take only if BP is over 180 systolic     atorvastatin  (LIPITOR ) 40 MG tablet Take 1 tablet (40 mg total) by mouth daily. 90 tablet 0   Cholecalciferol (D3 PO) Take 1 capsule by mouth daily.     clopidogrel  (PLAVIX ) 75 MG tablet Take 75 mg by mouth daily.     ezetimibe (ZETIA)  10 MG tablet Take 10 mg by mouth daily.     Ferrous Sulfate (IRON  PO) Take 1 tablet by mouth every other day.     memantine  (NAMENDA ) 10 MG tablet TAKE ONE TABLET BY MOUTH TWICE DAILY 180 tablet 0   midodrine  (PROAMATINE ) 5 MG tablet Take 1 tablet (5 mg total) by mouth 3 (three) times daily with meals. 90 tablet 0   polyethylene glycol (MIRALAX  / GLYCOLAX ) 17 g packet Take 17 g by mouth daily.     senna (SENOKOT) 8.6 MG tablet Take 1 tablet by mouth daily.     sitaGLIPtin (JANUVIA) 25 MG tablet Take 25 mg by mouth daily.     sodium bicarbonate  650 MG tablet Take 650 mg by mouth 2 (two) times daily.     tamsulosin  (FLOMAX ) 0.4 MG CAPS capsule Take 0.4 mg by mouth at bedtime.       Discharge Medications: Please see discharge summary for a list of discharge medications.  Relevant Imaging Results:  Relevant Lab Results:   Additional Information SSN [id number]  Luann SHAUNNA Cumming, LCSW

## 2024-07-24 NOTE — ED Provider Notes (Signed)
 Emergency Medicine Observation Re-evaluation Note  Erik Moss is a 89 y.o. male, seen on rounds today.  Pt initially presented to the ED for complaints of Left Hip Pain  and Fall Patient presented for hip pain in the setting of frequent falls, had negative x-ray and CT imaging, is pending evaluation by TOC and physical therapy. Currently, the patient is sleeping comfortably in bed.  Physical Exam  BP (!) 158/73 (BP Location: Right Arm)   Pulse 69   Temp (!) 97.4 F (36.3 C) (Oral)   Resp 18   Ht 5' 6 (1.676 m)   Wt 63 kg   SpO2 100%   BMI 22.42 kg/m  Physical Exam General: NAD Lungs: Normal effort Psych: Currently calm  ED Course / MDM  EKG:EKG Interpretation Date/Time:  Tuesday July 23 2024 21:30:25 EDT Ventricular Rate:  80 PR Interval:  242 QRS Duration:  84 QT Interval:  374 QTC Calculation: 431 R Axis:   -63  Text Interpretation: Sinus rhythm with 1st degree A-V block Left anterior fascicular block Anterior infarct , age undetermined Abnormal ECG When compared with ECG of 22-Jun-2024 15:14, No acute changes Confirmed by Haze Lonni PARAS [phone]) on 07/24/2024 12:04:10 AM  I have reviewed the labs performed to date as well as medications administered while in observation.  Recent changes in the last 24 hours include no new results since initial evaluation last night. I have reordered patient's home medications.  Plan  Current plan is for follow-up TOC and physical therapy recommendations.    [name], MD 07/24/24 1255

## 2024-07-24 NOTE — TOC Initial Note (Addendum)
 Transition of Care [facility]) - Initial/Assessment Note    Patient Details  Name: Erik Moss MRN: [id number] Date of Birth: 23-Sep-1933  [facility] CM/SW Contact:    Luann SHAUNNA Cumming, LCSW Phone Number: 07/24/2024, 10:00 AM  Clinical Narrative:                  CSW called pt's daughter to discuss SNF recommendation. She is agreeable to SNF w/u. Pt has been to [facility] in the past. Daughter lives with pt though she works in [place] and is traveling often. Pt goes to [employer] adult daycare and she utilizes private duty care givers as needed. Fl2 completed and bed requests sent in hub.   PASRR is pending.   1500: CSW called pt's daughter and provided SNF bed offers; she chooses [place]. [facility] has been started and is currently pending.  PASRR approved [id number]  1620: Auth Approved for [facility] [phone]  They will admit pt tomorrow.    Expected Discharge Plan: Skilled Nursing Facility Barriers to Discharge: SNF Pending bed offer           Expected Discharge Plan and Services       Living arrangements for the past 2 months: Skilled Nursing Facility                                      Prior Living Arrangements/Services Living arrangements for the past 2 months: Skilled Nursing Facility Lives with:: Adult Children Patient language and need for interpreter reviewed:: Yes        Need for Family Participation in Patient Care: Yes (Comment) Care giver support system in place?: Yes (comment)   Criminal Activity/Legal Involvement Pertinent to Current Situation/Hospitalization: No - Comment as needed  Activities of Daily Living      Permission Sought/Granted                  Emotional Assessment       Orientation: : Oriented to Self Alcohol / Substance Use: Not Applicable Psych Involvement: No (comment)  Admission diagnosis:  unable to walk Patient Active Problem List   Diagnosis Date Noted   Generalized  weakness 06/23/2024   Unresponsive episode 06/22/2024   Hypernatremia 09/09/2023   Dementia without behavioral disturbance, psychotic disturbance, mood disturbance, or anxiety (HCC) 11/30/2021   Urinary incontinence 11/30/2021   Gait abnormality 11/30/2021   Dementia without behavioral disturbance (HCC) 02/23/2021   Cerebrovascular accident (CVA) (HCC) 02/23/2021   Hyperglycemia due to diabetes mellitus (HCC) 09/24/2020   Acute CVA (cerebrovascular accident) (HCC) 10/08/2019   Type 2 diabetes mellitus (HCC) 10/08/2019   Hyperlipidemia 10/08/2019   BPH (benign prostatic hyperplasia) 10/08/2019   Hypertension 10/08/2019   ARF (acute renal failure) 05/08/2019   PCP:  Roanna Ezekiel NOVAK, MD Pharmacy:   [facility] [address], [place] - [address] [place] [address] Phone: [phone] Fax: [phone]  [facility] [id number] - [place] - [address] [place] [address] Phone: [phone] Fax: [phone]     Social Drivers of Health (SDOH) Social History: SDOH Screenings   Food Insecurity: No Food Insecurity (06/23/2024)  Housing: Low Risk  (06/23/2024)  Transportation Needs: No Transportation Needs (06/23/2024)  Utilities: Not At Risk (06/23/2024)  Depression (PHQ2-9): Low Risk  (11/19/2019)  Social Connections: Socially Isolated (06/23/2024)  Tobacco Use: Low Risk  (07/23/2024)   SDOH  Interventions:     Readmission Risk Interventions     No data to display

## 2024-07-24 NOTE — Evaluation (Signed)
 Physical Therapy Evaluation Patient Details Name: Erik Moss Labell MRN: [id number] DOB: September 07, 1933 Today's Date: 07/24/2024  History of Present Illness  89 y.o. male presents to [facility] 07/23/24 after falling with increased confusion and L hip pain. X-ray/Ct of hip unremarkable, CT head unremarkable. PMH: CVA, HTN, CKD stage IIIb, HLD, DM-2, dementia, BPH.  Clinical Impression  Pt supine in stretcher upon arrival and agreeable to PT eval. PTA, pt would ambulate short distances with use of rollator. Pt reports history of falls, however, unable to clarify how many or precipitating events. In today's session, pt required MinA for bed mobility and ModA for a posterior loss of balance once seated on EOB. Able to stand and ambulate 6ft with RW and ModA. Pt has a posterior lean when standing and ambulating requiring assist to correct and for RW proximity. Assist also needed to control descent from standing to sitting as pt attempted to sit prior to being close to the stretcher. At this time, recommending post-acute rehab <3hrs to prevent future falls and work towards independence with mobility. If pt/family decide to go home, pt would need 24/7 physical assist. Pt would benefit from acute skilled PT with current functional limitations listed below (see PT Problem List). Acute PT to follow.         If plan is discharge home, recommend the following: A lot of help with walking and/or transfers;A lot of help with bathing/dressing/bathroom;Assistance with cooking/housework;Direct supervision/assist for medications management;Direct supervision/assist for financial management;Assist for transportation;Help with stairs or ramp for entrance   Can travel by private vehicle   Yes    Equipment Recommendations None recommended by PT     Functional Status Assessment Patient has had a recent decline in their functional status and demonstrates the ability to make significant improvements in function in a reasonable and  predictable amount of time.     Precautions / Restrictions Precautions Precautions: Fall Recall of Precautions/Restrictions: Impaired Restrictions Weight Bearing Restrictions Per Provider Order: No      Mobility  Bed Mobility Overal bed mobility: Needs Assistance Bed Mobility: Supine to Sit, Sit to Supine    Supine to sit: Mod assist Sit to supine: Min assist   General bed mobility comments: MinA for slight trunk raise to ascend, MinA for LE management for return to supine.    Transfers Overall transfer level: Needs assistance Equipment used: Rolling walker (2 wheels) Transfers: Sit to/from Stand Sit to Stand: Mod assist    General transfer comment: ModA for boost-up and steadying assist. Posterior lean with cues for hand placement    Ambulation/Gait Ambulation/Gait assistance: Mod assist Gait Distance (Feet): 6 Feet Assistive device: Rolling walker (2 wheels) Gait Pattern/deviations: Step-through pattern, Decreased stride length, Trunk flexed Gait velocity: decr   Pre-gait activities: standing marches General Gait Details: Forward flexed posture with shuffling steps. Assist needed for RW proximity and to maintain balance due to posterior lean. Assist to control descent as pt went to sit prior to being close to the bed.    Balance Overall balance assessment: Needs assistance, History of Falls Sitting-balance support: No upper extremity supported, Feet supported Sitting balance-Leahy Scale: Poor Sitting balance - Comments: ModA for initial posterior lean, CGA after correcting balance Postural control: Posterior lean Standing balance support: Bilateral upper extremity supported, During functional activity, Reliant on assistive device for balance Standing balance-Leahy Scale: Poor Standing balance comment: ModA for standing balance with posterior lean        Pertinent Vitals/Pain Pain Assessment Pain Assessment: Faces Faces Pain Scale:  Hurts little more Pain  Location: L hip Pain Descriptors / Indicators: Aching, Discomfort Pain Intervention(s): Limited activity within patient's tolerance, Monitored during session, Repositioned    Home Living Family/patient expects to be discharged to:: Private residence Living Arrangements: Children;Other (Comment) (Daughter, prn housekeeper) Available Help at Discharge: Family;Friend(s) Type of Home: House Home Access: Stairs to enter Entrance Stairs-Rails: Right Entrance Stairs-Number of Steps: 2   Home Layout: One level Home Equipment: Agricultural consultant (2 wheels);Rollator (4 wheels);Wheelchair - Counsellor Comments: Goes to day center during the day    Prior Function Prior Level of Function : Patient poor historian/Family not available    Mobility Comments: frequent falls, ambulates with rollator ADLs Comments: HHA helps with shower and dressing; takes a bus to daycare     Extremity/Trunk Assessment   Upper Extremity Assessment Upper Extremity Assessment: Defer to OT evaluation    Lower Extremity Assessment Lower Extremity Assessment: Generalized weakness (L hip pain with WB and movement)    Cervical / Trunk Assessment Cervical / Trunk Assessment: Kyphotic  Communication   Communication Communication: No apparent difficulties    Cognition Arousal: Alert Behavior During Therapy: WFL for tasks assessed/performed   PT - Cognitive impairments: History of cognitive impairments, Orientation, Problem solving, Safety/Judgement   Orientation impairments: Time    PT - Cognition Comments: Hx of dementia Following commands: Impaired Following commands impaired: Only follows one step commands consistently, Follows multi-step commands inconsistently, Follows multi-step commands with increased time     Cueing Cueing Techniques: Verbal cues, Tactile cues     General Comments General comments (skin integrity, edema, etc.): VSS on RA     PT Assessment  Patient needs continued PT services  PT Problem List Decreased strength;Decreased activity tolerance;Decreased balance;Decreased mobility;Decreased cognition;Decreased safety awareness;Decreased knowledge of use of DME       PT Treatment Interventions DME instruction;Gait training;Stair training;Functional mobility training;Therapeutic exercise;Therapeutic activities;Balance training;Neuromuscular re-education;Patient/family education    PT Goals (Current goals can be found in the Care Plan section)  Acute Rehab PT Goals Patient Stated Goal: to work with therapy PT Goal Formulation: With patient Time For Goal Achievement: 08/07/24 Potential to Achieve Goals: Good    Frequency Min 2X/week        AM-PAC PT 6 Clicks Mobility  Outcome Measure Help needed turning from your back to your side while in a flat bed without using bedrails?: A Little Help needed moving from lying on your back to sitting on the side of a flat bed without using bedrails?: A Little Help needed moving to and from a bed to a chair (including a wheelchair)?: A Lot Help needed standing up from a chair using your arms (e.g., wheelchair or bedside chair)?: A Lot Help needed to walk in hospital room?: Total Help needed climbing 3-5 steps with a railing? : Total 6 Click Score: 12    End of Session Equipment Utilized During Treatment: Gait belt Activity Tolerance: Patient tolerated treatment well Patient left: in bed;with call bell/phone within reach Nurse Communication: Mobility status PT Visit Diagnosis: Unsteadiness on feet (R26.81);Other abnormalities of gait and mobility (R26.89);Muscle weakness (generalized) (M62.81);History of falling (Z91.81)    Time: 9094-9075 PT Time Calculation (min) (ACUTE ONLY): 19 min   Charges:   PT Evaluation $PT Eval Low Complexity: 1 Low   PT General Charges $$ ACUTE PT VISIT: 1 Visit        Kate ORN, PT, DPT Secure Chat Preferred  Rehab Office [phone]  Kate BRAVO  Wendolyn 07/24/2024, 9:27 AM

## 2024-07-25 NOTE — ED Provider Notes (Signed)
 Emergency Medicine Observation Re-evaluation Note  Erik Moss is a 89 y.o. male, seen on rounds today.  Pt initially presented to the ED for complaints of Left Hip Pain  and Fall Currently, the patient is sleeping.  Physical Exam  BP 134/65   Pulse 63   Temp 97.7 F (36.5 C) (Oral)   Resp 16   Ht 5' 6 (1.676 m)   Wt 63 kg   SpO2 100%   BMI 22.42 kg/m  Physical Exam General: No acute distress, sleeping Lungs: No respiratory distress Psych: Calm, sleeping  ED Course / MDM  EKG:EKG Interpretation Date/Time:  Tuesday July 23 2024 21:30:25 EDT Ventricular Rate:  80 PR Interval:  242 QRS Duration:  84 QT Interval:  374 QTC Calculation: 431 R Axis:   -63  Text Interpretation: Sinus rhythm with 1st degree A-V block Left anterior fascicular block Anterior infarct , age undetermined Abnormal ECG When compared with ECG of 22-Jun-2024 15:14, No acute changes Confirmed by Haze Lonni PARAS [phone]) on 07/24/2024 12:04:10 AM  I have reviewed the labs performed to date as well as medications administered while in observation.  Recent changes in the last 24 hours include the patient has been accepted to a skilled nursing facility.  Plan  Current plan is for discharge to skilled nursing facility today.    Ula Prentice SAUNDERS, MD 07/25/24 [phone]

## 2024-07-25 NOTE — ED Notes (Signed)
 PTAR has been scheduled for the patient to return to [facility].  ETA: Within the hour.

## 2024-07-25 NOTE — Discharge Instructions (Signed)
 Please follow-up for rehabilitation at the skilled nursing facility and return to the ER for worsening symptoms.

## 2024-07-25 NOTE — Progress Notes (Signed)
 Pt for dc to [facility]. Pt's dtr Vina [phone] aware of dc and reports agreeable. Confirmed with Logan at [facility] they are prepared to admit pt to room 405A. RN provided with number for report. SW signing off at dc.   Julien Das, MSW, LCSW [phone] (coverage)

## 2024-08-23 ENCOUNTER — Emergency Department (HOSPITAL_COMMUNITY)

## 2024-08-23 ENCOUNTER — Other Ambulatory Visit: Payer: Self-pay

## 2024-08-23 ENCOUNTER — Inpatient Hospital Stay (HOSPITAL_COMMUNITY)
Admission: EM | Admit: 2024-08-23 | Discharge: 2024-08-28 | DRG: 884 | Disposition: A | Discharge status: Skilled Nursing Facility | Attending: Family Medicine | Admitting: Family Medicine

## 2024-08-23 ENCOUNTER — Encounter (HOSPITAL_COMMUNITY): Payer: Self-pay | Admitting: *Deleted

## 2024-08-23 DIAGNOSIS — Z8673 Personal history of transient ischemic attack (TIA), and cerebral infarction without residual deficits: Secondary | ICD-10-CM | POA: Diagnosis not present

## 2024-08-23 DIAGNOSIS — F0390 Unspecified dementia without behavioral disturbance: Secondary | ICD-10-CM | POA: Diagnosis not present

## 2024-08-23 DIAGNOSIS — G928 Other toxic encephalopathy: Secondary | ICD-10-CM | POA: Diagnosis not present

## 2024-08-23 DIAGNOSIS — I129 Hypertensive chronic kidney disease with stage 1 through stage 4 chronic kidney disease, or unspecified chronic kidney disease: Secondary | ICD-10-CM | POA: Diagnosis present

## 2024-08-23 DIAGNOSIS — I10 Essential (primary) hypertension: Secondary | ICD-10-CM | POA: Diagnosis not present

## 2024-08-23 DIAGNOSIS — Z7984 Long term (current) use of oral hypoglycemic drugs: Secondary | ICD-10-CM | POA: Diagnosis not present

## 2024-08-23 DIAGNOSIS — I672 Cerebral atherosclerosis: Secondary | ICD-10-CM | POA: Diagnosis present

## 2024-08-23 DIAGNOSIS — N400 Enlarged prostate without lower urinary tract symptoms: Secondary | ICD-10-CM | POA: Diagnosis present

## 2024-08-23 DIAGNOSIS — E1122 Type 2 diabetes mellitus with diabetic chronic kidney disease: Secondary | ICD-10-CM | POA: Diagnosis present

## 2024-08-23 DIAGNOSIS — R29704 NIHSS score 4: Secondary | ICD-10-CM

## 2024-08-23 DIAGNOSIS — I674 Hypertensive encephalopathy: Secondary | ICD-10-CM | POA: Diagnosis present

## 2024-08-23 DIAGNOSIS — E785 Hyperlipidemia, unspecified: Secondary | ICD-10-CM | POA: Diagnosis present

## 2024-08-23 DIAGNOSIS — R32 Unspecified urinary incontinence: Secondary | ICD-10-CM | POA: Diagnosis present

## 2024-08-23 DIAGNOSIS — E860 Dehydration: Secondary | ICD-10-CM | POA: Diagnosis present

## 2024-08-23 DIAGNOSIS — E875 Hyperkalemia: Secondary | ICD-10-CM | POA: Diagnosis present

## 2024-08-23 DIAGNOSIS — I444 Left anterior fascicular block: Secondary | ICD-10-CM | POA: Diagnosis present

## 2024-08-23 DIAGNOSIS — I7090 Unspecified atherosclerosis: Secondary | ICD-10-CM

## 2024-08-23 DIAGNOSIS — G9349 Other encephalopathy: Secondary | ICD-10-CM | POA: Diagnosis present

## 2024-08-23 DIAGNOSIS — R569 Unspecified convulsions: Secondary | ICD-10-CM

## 2024-08-23 DIAGNOSIS — N1832 Chronic kidney disease, stage 3b: Secondary | ICD-10-CM | POA: Diagnosis present

## 2024-08-23 DIAGNOSIS — R471 Dysarthria and anarthria: Secondary | ICD-10-CM | POA: Diagnosis present

## 2024-08-23 DIAGNOSIS — Z7985 Long-term (current) use of injectable non-insulin antidiabetic drugs: Secondary | ICD-10-CM

## 2024-08-23 DIAGNOSIS — N179 Acute kidney failure, unspecified: Secondary | ICD-10-CM | POA: Diagnosis present

## 2024-08-23 DIAGNOSIS — E119 Type 2 diabetes mellitus without complications: Secondary | ICD-10-CM

## 2024-08-23 DIAGNOSIS — G9340 Encephalopathy, unspecified: Secondary | ICD-10-CM | POA: Diagnosis not present

## 2024-08-23 DIAGNOSIS — Z79899 Other long term (current) drug therapy: Secondary | ICD-10-CM | POA: Diagnosis not present

## 2024-08-23 DIAGNOSIS — I639 Cerebral infarction, unspecified: Secondary | ICD-10-CM

## 2024-08-23 DIAGNOSIS — Z7902 Long term (current) use of antithrombotics/antiplatelets: Secondary | ICD-10-CM

## 2024-08-23 DIAGNOSIS — R531 Weakness: Secondary | ICD-10-CM

## 2024-08-23 LAB — CBC
HCT: 29.6 % — ABNORMAL LOW (ref 39.0–52.0)
Hemoglobin: 9.4 g/dL — ABNORMAL LOW (ref 13.0–17.0)
MCH: 30.5 pg (ref 26.0–34.0)
MCHC: 31.8 g/dL (ref 30.0–36.0)
MCV: 96.1 fL (ref 80.0–100.0)
Platelets: 269 K/uL (ref 150–400)
RBC: 3.08 MIL/uL — ABNORMAL LOW (ref 4.22–5.81)
RDW: 14.6 % (ref 11.5–15.5)
WBC: 5.9 K/uL (ref 4.0–10.5)
nRBC: 0 % (ref 0.0–0.2)

## 2024-08-23 LAB — COMPREHENSIVE METABOLIC PANEL WITH GFR
ALT: 16 U/L (ref 0–44)
AST: 14 U/L — ABNORMAL LOW (ref 15–41)
Albumin: 3.4 g/dL — ABNORMAL LOW (ref 3.5–5.0)
Alkaline Phosphatase: 90 U/L (ref 38–126)
Anion gap: 11 (ref 5–15)
BUN: 26 mg/dL — ABNORMAL HIGH (ref 8–23)
CO2: 22 mmol/L (ref 22–32)
Calcium: 8.9 mg/dL (ref 8.9–10.3)
Chloride: 104 mmol/L (ref 98–111)
Creatinine, Ser: 2.21 mg/dL — ABNORMAL HIGH (ref 0.61–1.24)
GFR, Estimated: 28 mL/min — ABNORMAL LOW (ref >60)
Glucose, Bld: 172 mg/dL — ABNORMAL HIGH (ref 70–99)
Potassium: 4.3 mmol/L (ref 3.5–5.1)
Sodium: 137 mmol/L (ref 135–145)
Total Bilirubin: 0.6 mg/dL (ref 0.0–1.2)
Total Protein: 6.2 g/dL — ABNORMAL LOW (ref 6.5–8.1)

## 2024-08-23 LAB — DIFFERENTIAL
Abs Immature Granulocytes: 0.02 K/uL (ref 0.00–0.07)
Basophils Absolute: 0 K/uL (ref 0.0–0.1)
Basophils Relative: 1 %
Eosinophils Absolute: 0.1 K/uL (ref 0.0–0.5)
Eosinophils Relative: 2 %
Immature Granulocytes: 0 %
Lymphocytes Relative: 44 %
Lymphs Abs: 2.6 K/uL (ref 0.7–4.0)
Monocytes Absolute: 0.7 K/uL (ref 0.1–1.0)
Monocytes Relative: 11 %
Neutro Abs: 2.5 K/uL (ref 1.7–7.7)
Neutrophils Relative %: 42 %

## 2024-08-23 LAB — URINALYSIS, ROUTINE W REFLEX MICROSCOPIC
Bilirubin Urine: NEGATIVE
Glucose, UA: 50 mg/dL — AB
Hgb urine dipstick: NEGATIVE
Ketones, ur: NEGATIVE mg/dL
Leukocytes,Ua: NEGATIVE
Nitrite: NEGATIVE
Protein, ur: NEGATIVE mg/dL
Specific Gravity, Urine: 1.012 (ref 1.005–1.030)
pH: 7 (ref 5.0–8.0)

## 2024-08-23 LAB — PROCALCITONIN: Procalcitonin: <0.1 ng/mL

## 2024-08-23 LAB — I-STAT CHEM 8, ED
BUN: 26 mg/dL — ABNORMAL HIGH (ref 8–23)
Calcium, Ion: 1.08 mmol/L — ABNORMAL LOW (ref 1.15–1.40)
Chloride: 108 mmol/L (ref 98–111)
Creatinine, Ser: 2.2 mg/dL — ABNORMAL HIGH (ref 0.61–1.24)
Glucose, Bld: 172 mg/dL — ABNORMAL HIGH (ref 70–99)
HCT: 30 % — ABNORMAL LOW (ref 39.0–52.0)
Hemoglobin: 10.2 g/dL — ABNORMAL LOW (ref 13.0–17.0)
Potassium: 4.2 mmol/L (ref 3.5–5.1)
Sodium: 139 mmol/L (ref 135–145)
TCO2: 20 mmol/L — ABNORMAL LOW (ref 22–32)

## 2024-08-23 LAB — PROTIME-INR
INR: 1.1 (ref 0.8–1.2)
Prothrombin Time: 14.3 s (ref 11.4–15.2)

## 2024-08-23 LAB — CBG MONITORING, ED: Glucose-Capillary: 164 mg/dL — ABNORMAL HIGH (ref 70–99)

## 2024-08-23 LAB — APTT: aPTT: 29 s (ref 24–36)

## 2024-08-23 LAB — ETHANOL: Alcohol, Ethyl (B): <15 mg/dL (ref <15)

## 2024-08-23 MED ORDER — MEMANTINE HCL 10 MG PO TABS
10.0000 mg | ORAL_TABLET | Freq: Two times a day (BID) | ORAL | Status: DC
  Administered 2024-08-24 – 2024-08-28 (×9): 10 mg via ORAL
  Filled 2024-08-23 (×9): qty 1

## 2024-08-23 MED ORDER — SENNOSIDES-DOCUSATE SODIUM 8.6-50 MG PO TABS
2.0000 | ORAL_TABLET | Freq: Two times a day (BID) | ORAL | Status: DC
  Administered 2024-08-24 – 2024-08-28 (×6): 2 via ORAL
  Filled 2024-08-23 (×6): qty 2

## 2024-08-23 MED ORDER — AMLODIPINE BESYLATE 5 MG PO TABS
5.0000 mg | ORAL_TABLET | Freq: Every day | ORAL | Status: DC
  Administered 2024-08-24 – 2024-08-25 (×2): 5 mg via ORAL
  Filled 2024-08-23 (×2): qty 1

## 2024-08-23 MED ORDER — ATORVASTATIN CALCIUM 40 MG PO TABS
40.0000 mg | ORAL_TABLET | Freq: Every day | ORAL | Status: DC
Start: 2024-08-23 — End: 2024-08-28
  Administered 2024-08-24 – 2024-08-28 (×5): 40 mg via ORAL
  Filled 2024-08-23 (×5): qty 1

## 2024-08-23 MED ORDER — POLYETHYLENE GLYCOL 3350 17 G PO PACK
17.0000 g | PACK | Freq: Two times a day (BID) | ORAL | Status: DC
  Administered 2024-08-24 – 2024-08-28 (×5): 17 g via ORAL
  Filled 2024-08-23 (×6): qty 1

## 2024-08-23 MED ORDER — SODIUM CHLORIDE 0.9% FLUSH
3.0000 mL | Freq: Once | INTRAVENOUS | Status: AC
  Administered 2024-08-23: 3 mL via INTRAVENOUS

## 2024-08-23 MED ORDER — TAMSULOSIN HCL 0.4 MG PO CAPS
0.4000 mg | ORAL_CAPSULE | Freq: Every day | ORAL | Status: DC
  Administered 2024-08-24 – 2024-08-27 (×4): 0.4 mg via ORAL
  Filled 2024-08-23 (×4): qty 1

## 2024-08-23 MED ORDER — IOHEXOL 350 MG/ML SOLN
100.0000 mL | Freq: Once | INTRAVENOUS | Status: AC | PRN
  Administered 2024-08-23: 100 mL via INTRAVENOUS

## 2024-08-23 MED ORDER — LORAZEPAM 2 MG/ML IJ SOLN
2.0000 mg | Freq: Once | INTRAMUSCULAR | Status: AC
  Administered 2024-08-23: 2 mg via INTRAVENOUS
  Filled 2024-08-23: qty 1

## 2024-08-23 NOTE — H&P (Addendum)
 History and Physical    Patient: Erik Moss FMW:[id number] DOB: [birth year] DOA: 08/23/2024 DOS: the patient was seen and examined on 08/23/2024 PCP: Roanna Ezekiel NOVAK, MD  Patient coming from: Home  Chief Complaint:  Chief Complaint  Patient presents with   Code Stroke   HPI: Erik Moss is a 89 y.o. male with medical history significant of HTN, HLD, DM2, BPH, CKD stage IIIb, dementia, and recent admission in 06/2024 for severe orthostasis c/b simple syncope for which pt was started on midodrine  p/w acute encephalopathy.  Pt is unable to provide a reliable history. From what I can gather, pt BIBA as code stroke for which ' LKW at 2030 last night and now complaining of weakness, dysarthria, incontinence.  In the ED, pt hypertensive (SBP 180s). Labs notable for Cr 2.2 (which is his baseline per Epic review), WBC 5.9 and procalcitonin <0.10. CTA head and neck and MRI brain w/o acute infarction and ischemia on perfusion, but did show moderate stenosis of the supraclinoid segments of the internal carotid arteries, worse on the right, approximately 60 to 70%, and extensive calcific plaque within the carotid siphons bilaterally. EDP consulted Neurology who requested medicine admission.   Review of Systems: As mentioned in the history of present illness. All other systems reviewed and are negative. Past Medical History:  Diagnosis Date   BPH (benign prostatic hyperplasia) 10/08/2019   Diabetes mellitus without complication (HCC)    Hyperlipidemia 10/08/2019   Hypertension 10/08/2019   Renal disease    Stroke [facility])    TIA (transient ischemic attack)    Type 2 diabetes mellitus (HCC) 10/08/2019   Past Surgical History:  Procedure Laterality Date   BUBBLE STUDY  10/10/2019   Procedure: BUBBLE STUDY;  Surgeon: Barbaraann Darryle Ned, MD;  Location: [facility] ENDOSCOPY;  Service: Cardiology;;   TEE WITHOUT CARDIOVERSION N/A 10/10/2019   Procedure: TRANSESOPHAGEAL ECHOCARDIOGRAM  (TEE);  Surgeon: Barbaraann Darryle Ned, MD;  Location: [facility] ENDOSCOPY;  Service: Cardiology;  Laterality: N/A;   Social History:  reports that he has never smoked. He has never used smokeless tobacco. He reports that he does not drink alcohol and does not use drugs.  No Known Allergies  Family History  Problem Relation Age of Onset   Other Mother        Passed from old age   Other Father        Accident    Prior to Admission medications   Medication Sig Start Date End Date Taking? Authorizing Provider  amLODipine  (NORVASC ) 5 MG tablet Take only if BP is over 180 systolic 06/24/24  Yes Samtani, Jai-Gurmukh, MD  atorvastatin  (LIPITOR ) 40 MG tablet Take 1 tablet (40 mg total) by mouth daily. 10/10/19 08/23/24 Yes Austria, Camellia PARAS, DO  Cholecalciferol (D3 PO) Take 1 capsule by mouth daily.   Yes [provider]  clopidogrel  (PLAVIX ) 75 MG tablet Take 75 mg by mouth daily. 06/22/24  Yes [provider]  Ferrous Sulfate (IRON  PO) Take 1 tablet by mouth every other day.   Yes [provider]  memantine  (NAMENDA ) 10 MG tablet TAKE ONE TABLET BY MOUTH TWICE DAILY 11/09/23  Yes McCue, Harlene, NP  midodrine  (PROAMATINE ) 5 MG tablet Take 10 mg by mouth 3 (three) times daily.   Yes [provider]  polyethylene glycol (MIRALAX  / GLYCOLAX ) 17 g packet Take 17 g by mouth daily.   Yes [provider]  Semaglutide,0.25 or 0.5MG /DOS, (OZEMPIC, 0.25 OR 0.5 MG/DOSE,) 2 MG/3ML SOPN Inject 0.25 mLs  into the skin every 7 (seven) days.   Yes [provider]  senna (SENOKOT) 8.6 MG tablet Take 1 tablet by mouth daily.   Yes [provider]  sitaGLIPtin (JANUVIA) 25 MG tablet Take 25 mg by mouth daily.   Yes [provider]  sodium bicarbonate  650 MG tablet Take 650 mg by mouth 2 (two) times daily.   Yes [provider]  tamsulosin  (FLOMAX ) 0.4 MG CAPS capsule Take 0.4 mg by mouth at bedtime. 08/02/23  Yes [provider]   ezetimibe (ZETIA) 10 MG tablet Take 10 mg by mouth daily. Patient not taking: Reported on 08/23/2024    [provider]    Physical Exam: Vitals:   08/23/24 1300 08/23/24 1315 08/23/24 1330 08/23/24 1345  BP: (!) 157/112 (!) 186/83 (!) 149/92 (!) 172/85  Pulse:  65    Resp: 14 16 15 18   Temp:      TempSrc:      SpO2: 100% 100% 100% 100%  Weight:      Height:       General: Alert (awakens to voice, opens eyes to command), oriented x0 (unable or unwilling to answer; RN notified pt just received ativan), resting comfortably in no acute distress Respiratory: Lungs clear to auscultation bilaterally with normal respiratory effort; no w/r/r Cardiovascular: Regular rate and rhythm w/o m/r/g   Data Reviewed:  Lab Results  Component Value Date   WBC 5.9 08/23/2024   HGB 9.4 (L) 08/23/2024   HCT 29.6 (L) 08/23/2024   MCV 96.1 08/23/2024   PLT 269 08/23/2024   Lab Results  Component Value Date   GLUCOSE 172 (H) 08/23/2024   CALCIUM  8.9 08/23/2024   NA 137 08/23/2024   K 4.3 08/23/2024   CO2 22 08/23/2024   CL 104 08/23/2024   BUN 26 (H) 08/23/2024   CREATININE 2.21 (H) 08/23/2024   Lab Results  Component Value Date   ALT 16 08/23/2024   AST 14 (L) 08/23/2024   ALKPHOS 90 08/23/2024   BILITOT 0.6 08/23/2024   Lab Results  Component Value Date   INR 1.1 08/23/2024   INR 1.1 08/22/2021   INR 0.9 10/08/2019   Radiology: EEG adult Result Date: 08/23/2024 Shelton Arlin KIDD, MD     08/23/2024 11:28 AM Patient Name: Erik Moss MRN: [id number] Epilepsy Attending: Arlin KIDD Shelton Referring Physician/Provider: Remi Pippin, NP Date: 08/23/2024 Duration: 22.40 mins Patient history:  89 y.o. male with hx of prior stroke with no residual deficits, dementia, DM, HTN, HLD, who was brought in as a code stroke for slurred speech, confusion and b/l LE weakness. EEG to evaluate for seizure Level of alertness: sleep/ lethargic AEDs during EEG study: Ativan Technical  aspects: This EEG study was done with scalp electrodes positioned according to the 10-20 International system of electrode placement. Electrical activity was reviewed with band pass filter of 1-70Hz , sensitivity of 7 uV/mm, display speed of 56mm/sec with a 60Hz  notched filter applied as appropriate. EEG data were recorded continuously and digitally stored.  Video monitoring was available and reviewed as appropriate. Description: Sleep was characterized by vertex waves, sleep spindles (12 to 14 Hz), maximal frontocentral region.  EEG showed continuous generalized 3 to 6 Hz theta-delta slowing. Hyperventilation and photic stimulation were not performed.   ABNORMALITY - Continuous slow, generalized IMPRESSION: This study is suggestive of generalized cerebral dysfunction (encephalopathy). No seizures or epileptiform discharges were seen throughout the recording. Priyanka O Yadav   MR BRAIN WO CONTRAST Result Date:  08/23/2024 EXAM: MRI BRAIN WITHOUT CONTRAST 08/23/2024 08:47:10 AM TECHNIQUE: Multiplanar multisequence MRI of the head/brain was performed without the administration of intravenous contrast. COMPARISON: MRI head 06/23/2024. CT head from earlier today. CLINICAL HISTORY: Neuro deficit, acute, stroke suspected FINDINGS: BRAIN AND VENTRICLES: No acute infarct. No intracranial hemorrhage. No mass. No midline shift. No hydrocephalus. Mild for age patchy white matter T2/FLAIR hyperintensities, compatible with chronic microvascular ischemic disease. Cerebral atrophy. Normal flow voids. ORBITS: No acute abnormality. SINUSES AND MASTOIDS: Opacified posterior right ethmoid air cell. Otherwise, clear sinus. No mastoid effusions. BONES AND SOFT TISSUES: Normal marrow signal. No acute soft tissue abnormality. IMPRESSION: 1. No acute intracranial abnormality. Electronically signed by: Gilmore Molt MD 08/23/2024 08:58 AM EDT RP Workstation: [id number]   DG CHEST PORT 1 VIEW Result Date: 08/23/2024 CLINICAL DATA:   Acute stroke. EXAM: PORTABLE CHEST 1 VIEW COMPARISON:  None Available. FINDINGS: The heart size and mediastinal contours are within normal limits. Low lung volumes with mild bibasilar atelectasis noted. The lungs are otherwise clear. The visualized skeletal structures are unremarkable. IMPRESSION: Low lung volumes with mild bibasilar atelectasis. Electronically Signed   By: Norleen DELENA Kil M.D.   On: 08/23/2024 08:33   CT ANGIO HEAD NECK W WO CM W PERF (CODE STROKE) Result Date: 08/23/2024 EXAM: CT BRAIN PERFUSION 08/23/2024 07:21:01 AM TECHNIQUE: Cerebral perfusion analysis using computed tomography with contrast administration, including post-processing of parametric maps with determination of cerebral blood flow, cerebral blood volume, mean transit time and time-to-maximum. Automated exposure control, iterative reconstruction, and/or weight based adjustment of the mA/kV was utilized to reduce the radiation dose to as low as reasonably achievable. COMPARISON: CT of the head dated 08/23/2024. CLINICAL HISTORY: Neuro deficit, acute, stroke suspected. FINDINGS: CT PERFUSION: The CT perfusion study is normal. EXAM QUALITY: The examination is adequate with diagnostic perfusion maps. No significant motion artifact. Appropriate arterial inflow and venous outflow curves. CORE INFARCT (CBF<30% volume): 0 mL TOTAL HYPOPERFUSION (Tmax>6s volume): 0 mL PENUMBRA: Mismatch volume: 0 mL Mismatch ratio: Not applicable Location: Not applicable VASCULATURE: There is mild calcific plaque within the aortic arch. There is mild calcific plaque within the carotid bulbs bilaterally. There is moderate calcific plaque present within the origin of the left internal carotid artery. There is a pseudoaneurysm along the posterior wall just beyond the origin, measuring approximately 5 x 4 x 8 mm. There is mild-to-moderate calcific plaque within the proximal right internal carotid artery, with less than 20% luminal stenosis. There is extensive  calcific plaque within the carotid siphons bilaterally with estimated stenosis of 50 to 60%. There is also moderate stenosis of the supraclinoid segments of the internal carotid arteries, worse on the right, approximately 60 to 70%. There is an outpouching along the posterior wall of the supraclinoid segment of the left internal carotid artery seen on image 106 of series 6, measuring approximately 1 mm, which may represent an infundibulum or aneurysm. The right posterior communicating artery is diminutive. The anterior and middle cerebral arteries are patent and normal in caliber. The vertebral basilar system is grossly unremarkable. The right vertebral artery is dominant. Please note that the above findings were communicated to Dr. Voncile at approximately 07:32 AM 08/23/2024. IMPRESSION: 1. No evidence of ischemia by CT brain perfusion. 2. 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 3. Moderate stenosis of the supraclinoid segments of the internal carotid arteries, worse on the right, approximately 60 to 70%. 4.  Extensive calcific plaque within the carotid siphons bilaterally with estimated stenosis of 50 to 60%. 5. Outpouching along the posterior wall of the supraclinoid segment of the left internal carotid artery, measuring approximately 1 mm, which may represent an infundibulum or aneurysm. Electronically signed by: Evalene Coho MD 08/23/2024 07:43 AM EDT RP Workstation: [id number]   CT HEAD CODE STROKE WO CONTRAST Result Date: 08/23/2024 EXAM: CT HEAD WITHOUT CONTRAST 08/23/2024 07:10:00 AM TECHNIQUE: CT of the head was performed without the administration of intravenous contrast. Automated exposure control, iterative reconstruction, and/or weight based adjustment of the mA/kV was utilized to reduce the radiation dose to as low as reasonably achievable. COMPARISON: CT of the head dated 07/23/2024. CLINICAL  HISTORY: Neuro deficit, acute, stroke suspected. FINDINGS: BRAIN AND VENTRICLES: Age-related atrophy and mild-to-moderate cerebral white matter disease. Calcific atheromatous disease within the carotid siphons and vertebral arteries. No acute hemorrhage. No evidence of acute infarct. No hydrocephalus. No extra-axial collection. No mass effect or midline shift. ORBITS: Patient is status post bilateral lens replacement. No acute abnormality. SINUSES: No acute abnormality. SOFT TISSUES AND SKULL: No acute soft tissue abnormality. No skull fracture. ASPECTS Score: [place] Stroke Program Early CT Score (ASPECTS) Ganglionic (caudate, IC, lentiform nucleus, insula, M1-M3): 7 Supraganglionic (M4-M6): 3 Total: 10 Findings were communicated to Dr. Voncile at 07:13 AM 08/23/2024. IMPRESSION: 1. No acute intracranial abnormality. 2. Age-related atrophy and mild-to-moderate cerebral white matter disease. 3. Calcific atheromatous disease within the carotid siphons and vertebral arteries. 4. Status post bilateral lens replacement. Electronically signed by: Evalene Coho MD 08/23/2024 07:16 AM EDT RP Workstation: [id number]    Assessment and Plan: 66M h/o HTN, HLD, DM2, BPH, CKD stage IIIb, dementia, and recent admission in 06/2024 for severe orthostasis c/b simple syncope for which pt was started on midodrine  p/w acute encephalopathy.  Acute encephalopathy Presumed hypertensive encephalopathy -Neuro following; apprec eval/recs (MRI neg; EEG pending) -Resume pta amlodipine  5mg  daily; titrate or add additional antihypertensives as indicated -D/c midodrine  (started last admission if repeated GLFs)   dvance Care Planning:   Code Status: Full Code   Consults: Neurology  Family Communication: Daughter  Severity of Illness: The appropriate patient status for this patient is INPATIENT. Inpatient status is judged to be reasonable and necessary in order to provide the required intensity of service to ensure the patient's  safety. The patient's presenting symptoms, physical exam findings, and initial radiographic and laboratory data in the context of their chronic comorbidities is felt to place them at high risk for further clinical deterioration. Furthermore, it is not anticipated that the patient will be medically stable for discharge from the hospital within 2 midnights of admission.   * I certify that at the point of admission it is my clinical judgment that the patient will require inpatient hospital care spanning beyond 2 midnights from the point of admission due to high intensity of service, high risk for further deterioration and high frequency of surveillance required.*   ------- I spent 56 minutes reviewing previous notes, at the bedside counseling/discussing the treatment plan, and performing clinical documentation.  Author: Marsha Ada, MD 08/23/2024 2:30 PM  For on call review [email or web address].

## 2024-08-23 NOTE — ED Notes (Signed)
 CCMD called.

## 2024-08-23 NOTE — ED Notes (Signed)
 MD at bedside aware of increased drowsiness due to Ativan admin.

## 2024-08-23 NOTE — Consult Note (Addendum)
 NEUROLOGY CONSULT NOTE   Date of service: August 23, 2024 Patient Name: Vue Pavon Jakes MRN:  [id number] DOB:  Jul 19, 1933 Chief Complaint: slurred speech, confusion, left sided arm drift - code stroke Requesting Provider: Garrick Charleston, MD  History of Present Illness  Vahe Pienta Holloman is a 89 y.o. male with hx of prior stroke with no residual deficits, dementia, DM, HTN, HLD, who was brought in as a code stroke for slurred speech, confusion and b/l LE weakness. He was not able to get out of bed this AM when wife woke him up. He had bowel and bladder incontience. No seizures noted by EMS report. In ER, he had some transient LUE drift but had persistent LLE weakness. Right side was full strength. Speech was dysarthric. No evidence of aphasia. He had diminished attention and concentration.  He is not able to provide history reliably.  No family at bedside.  LKW: 2030 10/30 when he went to bed Modified rankin score: 1-No significant post stroke disability and can perform usual duties with stroke symptoms IV Thrombolysis: OSW EVT: No ELVO  NIHSS components Score: Comment  1a Level of Conscious 0[x]  1[]  2[]  3[]      1b LOC Questions 0[]  1[x]  2[]       1c LOC Commands 0[x]  1[]  2[]       2 Best Gaze 0[x]  1[]  2[]       3 Visual 0[x]  1[]  2[]  3[]      4 Facial Palsy 0[x]  1[]  2[]  3[]      5a Motor Arm - left 0[x]  1[]  2[]  3[]  4[]  UN[]    5b Motor Arm - Right 0[x]  1[]  2[]  3[]  4[]  UN[]    6a Motor Leg - Left 0[]  1[]  2[x]  3[]  4[]  UN[]    6b Motor Leg - Right 0[x]  1[]  2[]  3[]  4[]  UN[]    7 Limb Ataxia 0[x]  1[]  2[]  UN[]      8 Sensory 0[x]  1[]  2[]  UN[]      9 Best Language 0[x]  1[]  2[]  3[]      10 Dysarthria 0[]  1[x]  2[]  UN[]      11 Extinct. and Inattention 0[x]  1[]  2[]       TOTAL: 4      ROS  Comprehensive ROS performed and pertinent positives documented in HPI   Past History   Past Medical History:  Diagnosis Date   BPH (benign prostatic hyperplasia) 10/08/2019   Diabetes mellitus without  complication (HCC)    Hyperlipidemia 10/08/2019   Hypertension 10/08/2019   Renal disease    Stroke [facility])    TIA (transient ischemic attack)    Type 2 diabetes mellitus (HCC) 10/08/2019    Past Surgical History:  Procedure Laterality Date   BUBBLE STUDY  10/10/2019   Procedure: BUBBLE STUDY;  Surgeon: Barbaraann Darryle Ned, MD;  Location: [facility] ENDOSCOPY;  Service: Cardiology;;   TEE WITHOUT CARDIOVERSION N/A 10/10/2019   Procedure: TRANSESOPHAGEAL ECHOCARDIOGRAM (TEE);  Surgeon: Barbaraann Darryle Ned, MD;  Location: [facility] ENDOSCOPY;  Service: Cardiology;  Laterality: N/A;    Family History: Family History  Problem Relation Age of Onset   Other Mother        Passed from old age   Other Father        Accident    Social History  reports that he has never smoked. He has never used smokeless tobacco. He reports that he does not drink alcohol and does not use drugs.  No Known Allergies  Medications   Current Facility-Administered Medications:    sodium chloride  flush (NS) 0.9 % injection 3 mL,  3 mL, Intravenous, Once, Garrick Charleston, MD  Current Outpatient Medications:    amLODipine  (NORVASC ) 5 MG tablet, Take only if BP is over 180 systolic, Disp: , Rfl:    atorvastatin  (LIPITOR ) 40 MG tablet, Take 1 tablet (40 mg total) by mouth daily., Disp: 90 tablet, Rfl: 0   Cholecalciferol (D3 PO), Take 1 capsule by mouth daily., Disp: , Rfl:    clopidogrel  (PLAVIX ) 75 MG tablet, Take 75 mg by mouth daily., Disp: , Rfl:    ezetimibe (ZETIA) 10 MG tablet, Take 10 mg by mouth daily., Disp: , Rfl:    Ferrous Sulfate (IRON  PO), Take 1 tablet by mouth every other day., Disp: , Rfl:    memantine  (NAMENDA ) 10 MG tablet, TAKE ONE TABLET BY MOUTH TWICE DAILY, Disp: 180 tablet, Rfl: 0   polyethylene glycol (MIRALAX  / GLYCOLAX ) 17 g packet, Take 17 g by mouth daily., Disp: , Rfl:    senna (SENOKOT) 8.6 MG tablet, Take 1 tablet by mouth daily., Disp: , Rfl:    sitaGLIPtin (JANUVIA) 25 MG tablet,  Take 25 mg by mouth daily., Disp: , Rfl:    sodium bicarbonate  650 MG tablet, Take 650 mg by mouth 2 (two) times daily., Disp: , Rfl:    tamsulosin  (FLOMAX ) 0.4 MG CAPS capsule, Take 0.4 mg by mouth at bedtime., Disp: , Rfl:   Vitals   Vitals:   September 17, 2024 0700  Weight: 64.4 kg    Body mass index is 22.92 kg/m.   Physical Exam   Constitutional: Appears well-developed and well-nourished.  Psych: Affect appropriate to situation.  Eyes: No scleral injection.  HENT: No OP obstruction.  Head: Normocephalic.  Cardiovascular: Normal rate and regular rhythm.  Respiratory: Effort normal, non-labored breathing.  GI: Soft.  No distension. There is no tenderness.  Skin: WDI.   Neurologic Examination  He is awake alert ambulatory to staff, he could not tell us  the current month or age but when asked h what year it is he said it is  Halloween, which it is today. Mild dysarthria No evidence of aphasia Poor attention concentration Cranial nerves II through XII grossly intact Motor examination: Left lower extremity falls to the bed before 5 seconds.  No drift in any of the other extremities. Sensation intact Coordination somewhat difficult to do but no gross dysmetria   Labs/Imaging/Neurodiagnostic studies   CBC:  Recent Labs  Lab September 17, 2024 0707  HGB 10.2*  HCT 30.0*   Basic Metabolic Panel:  Lab Results  Component Value Date   NA 139 09/17/24   K 4.2 2024/09/17   CO2 23 07/23/2024   GLUCOSE 172 (H) Sep 17, 2024   BUN 26 (H) 09-17-2024   CREATININE 2.20 (H) September 17, 2024   CALCIUM  9.2 07/23/2024   GFRNONAA 21 (L) 07/23/2024   GFRAA 40 (L) 10/10/2019   Lipid Panel:  Lab Results  Component Value Date   LDLCALC 97 10/10/2019   HgbA1c:  Lab Results  Component Value Date   HGBA1C 8.3 (H) 06/23/2024   Urine Drug Screen:     Component Value Date/Time   LABOPIA NONE DETECTED 2021/09/17 0828   COCAINSCRNUR NONE DETECTED 2021/09/17 0828   LABBENZ NONE DETECTED 09-17-21 0828    AMPHETMU NONE DETECTED 09/17/21 0828   THCU NONE DETECTED 17-Sep-2021 0828   LABBARB NONE DETECTED 09-17-2021 0828    Alcohol Level     Component Value Date/Time   ETH <10 08/22/2021 2251   INR  Lab Results  Component Value Date   INR 1.1 08/22/2021  APTT  Lab Results  Component Value Date   APTT 27 08/22/2021   CT Head without contrast(Personally reviewed): Aspects 10.  No bleed  CT angio Head and Neck with contrast(Personally reviewed): Multifocal intracranial stenosis.  No evidence of emergent large vessel occlusion agree with the radiology report. CT perfusion study negative for perfusion deficit    ASSESSMENT   Kazumi Lachney Armentor is a 89 y.o. male with above past medical history was brought in for generalized weakness, inability to get out of bed, bowel bladder incontinence, dysarthria and confusion. On examination he had some left-sided weakness in addition to poor attention concentration and dysarthria. No evidence of bleed on the CT.  No evidence of ELVO on CTA but he has multifocal intracranial atherosclerosis and stenosis. Differentials at this time include stroke in the setting of disease intracranial vasculature versus seizure versus toxic metabolic encephalopathy in the setting of underlying infection  RECOMMENDATIONS  MRI brain without contrast If no evidence of stroke, I would recommend doing an EEG. Check UA, chest x-ray. We will follow. Plan was discussed with Dr. Garrick, EDP.    Addendum MRI of the brain is negative for acute process EEG pending. Agree with obs for closer monitoring  Plan d/w Dr. Garrick  ______________________________________________________________________    Signed, Eligio Lav, MD [facility]

## 2024-08-23 NOTE — ED Provider Notes (Signed)
 [place] EMERGENCY DEPARTMENT AT [facility] Provider Note  MDM   HPI/ROS:  Erik Moss is a 89 y.o. male with a PMH dementia, T2DM, HTN, HLD, CKD, CVA on Plavix  who presents as a code stroke.  Patient's LKN 2030 last p.m. prior to going to bed, this morning patient's wife states that patient was attempting to get out of bed, was very confused, had nonsensical speech, bilateral weakness and was attempting to use the bathroom on the bed.  She reports that he is typically alert and oriented x 3, able to converse at baseline, has no prior CVA residual deficits.  On my initial evaluation, patient is:  -Vital signs stable. Patient afebrile, hemodynamically stable, and non-toxic appearing.  Physical exam is notable for: - Awake, alert and but not oriented to time, place.  He does have mild dysarthria, no aphasia, poor attention/concentration.  Cranial nerves II through XII grossly intact.  Left lower extremity weakness.   Neurology was made available early in patient's assessment, they performed an initial neurologic assessment prior to moving to CT scanner.  CT revealed no LVO, he is not a candidate for thrombectomy and was outside of the window for TNKase.  There was some concern for seizures given that he had incontinence and was little weak bilaterally in his lower extremities with some slight improvement as his ED visit progressed.  Plan for MRI to confirm no stroke since there is multiple intracranial stenosis and if MRI is negative, will likely need EEG to rule out seizure-like activity.  CMP with glucose of 172, BUN 26, creatinine 2.21 (at patient's baseline), UA without evidence of UTI or infection, hemoglobin stable at 9.4, ethanol was less than 15.  CXR without evidence of acute cardiopulmonary abnormality, MR brain without any acute abnormality.  Patient is altered requiring admission for EEG to rule out seizure-like activity and for further evaluation and workup of  patient's AMS. Interpretations, interventions, and the patient's course of care are documented below.    Disposition:  I discussed the case with hospitalist who graciously agreed to admit the patient to their service for continued care.   Clinical Impression: No diagnosis found.  Clinical Complexity A medically appropriate history, review of systems, and physical exam was performed.  My independent interpretations of EKG, labs, and radiology are documented in the ED course above.   If decision rules were used in this patient's evaluation, they are listed below.   Click here for ABCD2, HEART and other calculatorsREFRESH Note before signing   Patient's presentation is most consistent with acute presentation with potential threat to life or bodily function.  Medical Decision Making Amount and/or Complexity of Data Reviewed Labs: ordered. Radiology: ordered.  Risk Prescription drug management. Decision regarding hospitalization.    HPI/ROS      See MDM section for pertinent HPI and ROS. A complete ROS was performed with pertinent positives/negatives noted above.   Past Medical History:  Diagnosis Date   BPH (benign prostatic hyperplasia) 10/08/2019   Diabetes mellitus without complication (HCC)    Hyperlipidemia 10/08/2019   Hypertension 10/08/2019   Renal disease    Stroke [facility])    TIA (transient ischemic attack)    Type 2 diabetes mellitus (HCC) 10/08/2019    Past Surgical History:  Procedure Laterality Date   BUBBLE STUDY  10/10/2019   Procedure: BUBBLE STUDY;  Surgeon: Barbaraann Darryle Ned, MD;  [facility] ENDOSCOPY;  Service: Cardiology;;   TEE WITHOUT CARDIOVERSION N/A 10/10/2019   Procedure: TRANSESOPHAGEAL ECHOCARDIOGRAM (TEE);  Surgeon: Barbaraann Darryle Ned, MD;  Location: [facility] ENDOSCOPY;  Service: Cardiology;  Laterality: N/A;      Physical Exam   Vitals:   08/23/24 0700  Weight: 64.4 kg    Physical Exam Vitals and nursing note reviewed.   Constitutional:      General: He is not in acute distress.    Appearance: He is well-developed.  HENT:     Head: Normocephalic and atraumatic.  Eyes:     Conjunctiva/sclera: Conjunctivae normal.  Cardiovascular:     Rate and Rhythm: Normal rate and regular rhythm.     Heart sounds: No murmur heard. Pulmonary:     Effort: Pulmonary effort is normal. No respiratory distress.     Breath sounds: Normal breath sounds.  Abdominal:     Palpations: Abdomen is soft.     Tenderness: There is no abdominal tenderness.  Musculoskeletal:        General: No swelling.     Cervical back: Neck supple.  Skin:    General: Skin is warm and dry.     Capillary Refill: Capillary refill takes less than 2 seconds.  Neurological:     Mental Status: He is alert.     Comments: Awake, alert and but not oriented to time, place.  He does have mild dysarthria, no aphasia, poor attention/concentration.  Cranial nerves II through XII grossly intact.  Left lower extremity weakness.      Procedures   If procedures were preformed on this patient, they are listed below:  Procedures   Please note that this documentation was produced with the assistance of voice-to-text technology and may contain errors.     Billy Pal, MD 08/25/24 1556    Garrick Charleston, MD 08/28/24 [phone]

## 2024-08-23 NOTE — Progress Notes (Signed)
 Pt received from ED, is very drowsy but is following commands, able to answer some orientation questions, tele box connected, bed alarm on, fall precaution in place.

## 2024-08-23 NOTE — ED Notes (Signed)
 Transported to MRI

## 2024-08-23 NOTE — Progress Notes (Signed)
 EEG complete - results pending

## 2024-08-23 NOTE — Plan of Care (Signed)
   Problem: Clinical Measurements: Goal: Respiratory complications will improve Outcome: Progressing Goal: Cardiovascular complication will be avoided Outcome: Progressing

## 2024-08-23 NOTE — Procedures (Signed)
 Patient Name: Erik Moss  MRN: [id number]  Epilepsy Attending: Arlin MALVA Krebs  Referring Physician/Provider: Remi Pippin, NP  Date: 08/23/2024 Duration: 22.40 mins  Patient history:  89 y.o. male with hx of prior stroke with no residual deficits, dementia, DM, HTN, HLD, who was brought in as a code stroke for slurred speech, confusion and b/l LE weakness. EEG to evaluate for seizure  Level of alertness: sleep/ lethargic   AEDs during EEG study: Ativan  Technical aspects: This EEG study was done with scalp electrodes positioned according to the 10-20 International system of electrode placement. Electrical activity was reviewed with band pass filter of 1-70Hz , sensitivity of 7 uV/mm, display speed of 49mm/sec with a 60Hz  notched filter applied as appropriate. EEG data were recorded continuously and digitally stored.  Video monitoring was available and reviewed as appropriate.  Description: Sleep was characterized by vertex waves, sleep spindles (12 to 14 Hz), maximal frontocentral region.  EEG showed continuous generalized 3 to 6 Hz theta-delta slowing. Hyperventilation and photic stimulation were not performed.     ABNORMALITY - Continuous slow, generalized  IMPRESSION: This study is suggestive of generalized cerebral dysfunction (encephalopathy). No seizures or epileptiform discharges were seen throughout the recording.  Machi Whittaker O Shaunessy Dobratz

## 2024-08-23 NOTE — ED Triage Notes (Signed)
 Patient presents to ed via [facility] states  patient was last seen normal at 830 pm she found patient laying half way out of bed this am incont of bowel and  bladder hx. Of CVA without deficit hx. Of mild dementia. Patient is alert oriented .

## 2024-08-23 NOTE — ED Notes (Signed)
 MD made aware of BP, awaiting response.

## 2024-08-23 NOTE — Code Documentation (Signed)
 Stroke Response Nurse Documentation Code Documentation  Tami Barren Perona is a 89 y.o. male arriving to [facility]  via [facility] on 08/23/2024 with past medical hx of prior CVA, dementia, DM HTN HLD. On clopidogrel  75 mg daily. Code stroke was activated by EMS.   Patient from home where he was LKW at 2030 last night and now complaining of weakness, dysarthria, incontinence.  Stroke team at the bedside on patient arrival. Labs drawn and patient cleared for CT by EDP Patient to CT with team. NIHSS 3, see documentation for details and code stroke times. Patient with left arm weakness, left leg weakness, and dysarthria  on exam. The following imaging was completed:  CT Head, CTA, and CTP. Patient is not a candidate for IV Thrombolytic due to OOW. Patient is not a candidate for IR due to LVO negative on advanced imaging.   Care Plan:    No acute treatment/TIA alert: q2h x 12 hours NIHSS & VS, then q4h    Bedside handoff with ED RN Burnard.    Adysen Raphael Livengood  Stroke Response RN

## 2024-08-24 DIAGNOSIS — G9340 Encephalopathy, unspecified: Secondary | ICD-10-CM | POA: Diagnosis not present

## 2024-08-24 DIAGNOSIS — N400 Enlarged prostate without lower urinary tract symptoms: Secondary | ICD-10-CM

## 2024-08-24 DIAGNOSIS — E785 Hyperlipidemia, unspecified: Secondary | ICD-10-CM

## 2024-08-24 DIAGNOSIS — F0390 Unspecified dementia without behavioral disturbance: Principal | ICD-10-CM

## 2024-08-24 DIAGNOSIS — I10 Essential (primary) hypertension: Secondary | ICD-10-CM

## 2024-08-24 MED ORDER — CLOPIDOGREL BISULFATE 75 MG PO TABS
75.0000 mg | ORAL_TABLET | Freq: Every day | ORAL | Status: DC
  Administered 2024-08-24 – 2024-08-28 (×5): 75 mg via ORAL
  Filled 2024-08-24 (×5): qty 1

## 2024-08-24 MED ORDER — ORAL CARE MOUTH RINSE: 15.0000 mL | OROMUCOSAL | Status: DC | PRN

## 2024-08-24 MED ORDER — ACETAMINOPHEN 325 MG PO TABS: 650.0000 mg | ORAL_TABLET | ORAL | Status: DC | PRN

## 2024-08-24 NOTE — Progress Notes (Signed)
 [facility] [employer], is a 89 y.o. male, DOB - 03-20-1933, FMW:[id number] Admit date - 08/23/2024    Outpatient Primary MD for the patient is Erik Ezekiel NOVAK, MD  LOS - 1  days    Brief summary    Erik Moss is a 89 y.o. male with medical history significant of HTN, HLD, DM2, BPH, CKD stage IIIb, dementia, and recent admission in 06/2024 for severe orthostatic hypotension with  syncope for which pt was started on midodrine  p/w acute encephalopathy. As per the daughter patient was recently discharged on MOnday to home , was brought in for confusion, generalized weakness and some speech abnormalities.    acute neurological event ruled out with a negative MRI.  Patient with no focal deficits.    Assessment & Plan     acute encephalopathy possibly from worsening dementia: MRI brain is negative for acute stroke.  Patient this am, is alert and is eating breakfast though clumsily.  He is oriented to person only, confused on his whereabouts. He is able to recognize his daughter.  No fever or signs of infection.  He does not appear to be dehydrated.  Get TSH, vit b12, b1 and folate levels.  Therapy eval ordered and pending.      Stage 3b CKD Creatinine stable.     Hypertension Well controlled.  Check orthostatic vital signs in am.    Hyperlipidemia Resume lipitor .    Dementia Resume Namenda .    BPH  On flomax .   H/o type 2 DM Get A1C.    Estimated body mass index is 22.77 kg/m as calculated from the following:   Height as of this encounter: 5' 6 (1.676 m).   Weight as of this encounter: 64 kg.  Code Status: full code.  DVT Prophylaxis:  SCDs Start: 08/23/24 0934   Level of Care: Level of care: Telemetry Family Communication: Updated patient's family at bedside.   Disposition Plan:     Remains inpatient appropriate:  pending clinical  improvement.   Procedures:  MRI brain.  CXR   Consultants:   Neurology.   Antimicrobials:   Anti-infectives (From admission, onward)    None        Medications  Scheduled Meds:  amLODipine   5 mg Oral Daily   atorvastatin   40 mg Oral Daily   clopidogrel   75 mg Oral Daily   memantine   10 mg Oral BID   polyethylene glycol  17 g Oral BID   senna-docusate  2 tablet Oral BID   tamsulosin   0.4 mg Oral QHS   Continuous Infusions: PRN Meds:.acetaminophen , mouth rinse    Subjective:   Erik Moss was seen and examined today.  Patient is eating breakfast without any issues. He is alert and comfortable.   Objective:   Vitals:   08/24/24 0437 08/24/24 0827 08/24/24 1120 08/24/24 1559  BP: (!) 177/79 (!) 163/82 133/69 114/62  Pulse: 80 79 87 74  Resp: 19 16 18 18   Temp: 97.9 F (36.6 C) 97.6 F (36.4 C) 98.5  F (36.9 C) 98.5 F (36.9 C)  TempSrc: Oral Oral  Oral  SpO2: 99% 98% 100% 100%  Weight:      Height:        Intake/Output Summary (Last 24 hours) at 08/24/2024 1716 Last data filed at 08/24/2024 1601 Gross per 24 hour  Intake --  Output 700 ml  Net -700 ml   Filed Weights   08/23/24 0700 08/23/24 0742  Weight: 64.4 kg 64 kg     Exam General exam: Appears calm and comfortable  Respiratory system: Clear to auscultation. Respiratory effort normal. Cardiovascular system: S1 & S2 heard, RRR.  Gastrointestinal system: Abdomen is nondistended, soft and nontender.  Central nervous system: Alert , confused, able to move all extremities.  Extremities: no pedal edema.  Skin: No rashes,  Psychiatry: mood is appropriate.    Data Reviewed:  I have personally reviewed following labs and imaging studies   CBC Lab Results  Component Value Date   WBC 5.9 08/23/2024   RBC 3.08 (L) 08/23/2024   HGB 9.4 (L) 08/23/2024   HCT 29.6 (L) 08/23/2024   MCV 96.1 08/23/2024   MCH 30.5 08/23/2024   PLT 269 08/23/2024   MCHC 31.8 08/23/2024   RDW 14.6  08/23/2024   LYMPHSABS 2.6 08/23/2024   MONOABS 0.7 08/23/2024   EOSABS 0.1 08/23/2024   BASOSABS 0.0 08/23/2024     Last metabolic panel Lab Results  Component Value Date   NA 137 08/23/2024   K 4.3 08/23/2024   CL 104 08/23/2024   CO2 22 08/23/2024   BUN 26 (H) 08/23/2024   CREATININE 2.21 (H) 08/23/2024   GLUCOSE 172 (H) 08/23/2024   GFRNONAA 28 (L) 08/23/2024   GFRAA 40 (L) 10/10/2019   CALCIUM  8.9 08/23/2024   PHOS 2.9 09/25/2020   PROT 6.2 (L) 08/23/2024   ALBUMIN 3.4 (L) 08/23/2024   LABGLOB 2.2 05/09/2019   AGRATIO 1.6 05/09/2019   BILITOT 0.6 08/23/2024   ALKPHOS 90 08/23/2024   AST 14 (L) 08/23/2024   ALT 16 08/23/2024   ANIONGAP 11 08/23/2024    CBG (last 3)  Recent Labs    08/23/24 0700  GLUCAP 164*      Coagulation Profile: Recent Labs  Lab 08/23/24 0708  INR 1.1     Radiology Studies: EEG adult Result Date: 08/23/2024 Moss Erik KIDD, MD     08/23/2024 11:28 AM Patient Name: Erik Moss MRN: [id number] Epilepsy Attending: Arlin KIDD Moss Referring Moss: Erik Pippin, NP Date: 08/23/2024 Duration: 22.40 mins Patient history:  89 y.o. male with hx of prior stroke with no residual deficits, dementia, DM, HTN, HLD, who was brought in as a code stroke for slurred speech, confusion and b/l LE weakness. EEG to evaluate for seizure Level of alertness: sleep/ lethargic AEDs during EEG study: Ativan Technical aspects: This EEG study was done with scalp electrodes positioned according to the 10-20 International system of electrode placement. Electrical activity was reviewed with band pass filter of 1-70Hz , sensitivity of 7 uV/mm, display speed of 24mm/sec with a 60Hz  notched filter applied as appropriate. EEG data were recorded continuously and digitally stored.  Video monitoring was available and reviewed as appropriate. Description: Sleep was characterized by vertex waves, sleep spindles (12 to 14 Hz), maximal frontocentral region.  EEG  showed continuous generalized 3 to 6 Hz theta-delta slowing. Hyperventilation and photic stimulation were not performed.   ABNORMALITY - Continuous slow, generalized IMPRESSION: This study is suggestive of generalized cerebral dysfunction (encephalopathy). No seizures or  epileptiform discharges were seen throughout the recording. Erik Moss   MR BRAIN WO CONTRAST Result Date: 08/23/2024 EXAM: MRI BRAIN WITHOUT CONTRAST 08/23/2024 08:47:10 AM TECHNIQUE: Multiplanar multisequence MRI of the head/brain was performed without the administration of intravenous contrast. COMPARISON: MRI head 06/23/2024. CT head from earlier today. CLINICAL HISTORY: Neuro deficit, acute, stroke suspected FINDINGS: BRAIN AND VENTRICLES: No acute infarct. No intracranial hemorrhage. No mass. No midline shift. No hydrocephalus. Mild for age patchy white matter T2/FLAIR hyperintensities, compatible with chronic microvascular ischemic disease. Cerebral atrophy. Normal flow voids. ORBITS: No acute abnormality. SINUSES AND MASTOIDS: Opacified posterior right ethmoid air cell. Otherwise, clear sinus. No mastoid effusions. BONES AND SOFT TISSUES: Normal marrow signal. No acute soft tissue abnormality. IMPRESSION: 1. No acute intracranial abnormality. Electronically signed by: Gilmore Molt MD 08/23/2024 08:58 AM EDT RP Workstation: [id number]   DG CHEST PORT 1 VIEW Result Date: 08/23/2024 CLINICAL DATA:  Acute stroke. EXAM: PORTABLE CHEST 1 VIEW COMPARISON:  None Available. FINDINGS: The heart size and mediastinal contours are within normal limits. Low lung volumes with mild bibasilar atelectasis noted. The lungs are otherwise clear. The visualized skeletal structures are unremarkable. IMPRESSION: Low lung volumes with mild bibasilar atelectasis. Electronically Signed   By: Norleen DELENA Kil M.D.   On: 08/23/2024 08:33   CT ANGIO HEAD NECK W WO CM W PERF (CODE STROKE) Result Date: 08/23/2024 EXAM: CT BRAIN PERFUSION 08/23/2024  07:21:01 AM TECHNIQUE: Cerebral perfusion analysis using computed tomography with contrast administration, including post-processing of parametric maps with determination of cerebral blood flow, cerebral blood volume, mean transit time and time-to-maximum. Automated exposure control, iterative reconstruction, and/or weight based adjustment of the mA/kV was utilized to reduce the radiation dose to as low as reasonably achievable. COMPARISON: CT of the head dated 08/23/2024. CLINICAL HISTORY: Neuro deficit, acute, stroke suspected. FINDINGS: CT PERFUSION: The CT perfusion study is normal. EXAM QUALITY: The examination is adequate with diagnostic perfusion maps. No significant motion artifact. Appropriate arterial inflow and venous outflow curves. CORE INFARCT (CBF<30% volume): 0 mL TOTAL HYPOPERFUSION (Tmax>6s volume): 0 mL PENUMBRA: Mismatch volume: 0 mL Mismatch ratio: Not applicable Location: Not applicable VASCULATURE: There is mild calcific plaque within the aortic arch. There is mild calcific plaque within the carotid bulbs bilaterally. There is moderate calcific plaque present within the origin of the left internal carotid artery. There is a pseudoaneurysm along the posterior wall just beyond the origin, measuring approximately 5 x 4 x 8 mm. There is mild-to-moderate calcific plaque within the proximal right internal carotid artery, with less than 20% luminal stenosis. There is extensive calcific plaque within the carotid siphons bilaterally with estimated stenosis of 50 to 60%. There is also moderate stenosis of the supraclinoid segments of the internal carotid arteries, worse on the right, approximately 60 to 70%. There is an outpouching along the posterior wall of the supraclinoid segment of the left internal carotid artery seen on image 106 of series 6, measuring approximately 1 mm, which may represent an infundibulum or aneurysm. The right posterior communicating artery is diminutive. The anterior and  middle cerebral arteries are patent and normal in caliber. The vertebral basilar system is grossly unremarkable. The right vertebral artery is dominant. Please note that the above findings were communicated to Dr. Voncile at approximately 07:32 AM 08/23/2024. IMPRESSION: 1. No evidence of ischemia by CT brain perfusion. 2. 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 3. Moderate stenosis  of the supraclinoid segments of the internal carotid arteries, worse on the right, approximately 60 to 70%. 4. Extensive calcific plaque within the carotid siphons bilaterally with estimated stenosis of 50 to 60%. 5. Outpouching along the posterior wall of the supraclinoid segment of the left internal carotid artery, measuring approximately 1 mm, which may represent an infundibulum or aneurysm. Electronically signed by: Evalene Coho MD 08/23/2024 07:43 AM EDT RP Workstation: [id number]   CT HEAD CODE STROKE WO CONTRAST Result Date: 08/23/2024 EXAM: CT HEAD WITHOUT CONTRAST 08/23/2024 07:10:00 AM TECHNIQUE: CT of the head was performed without the administration of intravenous contrast. Automated exposure control, iterative reconstruction, and/or weight based adjustment of the mA/kV was utilized to reduce the radiation dose to as low as reasonably achievable. COMPARISON: CT of the head dated 07/23/2024. CLINICAL HISTORY: Neuro deficit, acute, stroke suspected. FINDINGS: BRAIN AND VENTRICLES: Age-related atrophy and mild-to-moderate cerebral white matter disease. Calcific atheromatous disease within the carotid siphons and vertebral arteries. No acute hemorrhage. No evidence of acute infarct. No hydrocephalus. No extra-axial collection. No mass effect or midline shift. ORBITS: Patient is status post bilateral lens replacement. No acute abnormality. SINUSES: No acute abnormality. SOFT TISSUES AND SKULL: No acute soft tissue abnormality.  No skull fracture. ASPECTS Score: [place] Stroke Program Early CT Score (ASPECTS) Ganglionic (caudate, IC, lentiform nucleus, insula, M1-M3): 7 Supraganglionic (M4-M6): 3 Total: 10 Findings were communicated to Dr. Voncile at 07:13 AM 08/23/2024. IMPRESSION: 1. No acute intracranial abnormality. 2. Age-related atrophy and mild-to-moderate cerebral white matter disease. 3. Calcific atheromatous disease within the carotid siphons and vertebral arteries. 4. Status post bilateral lens replacement. Electronically signed by: Evalene Coho MD 08/23/2024 07:16 AM EDT RP Workstation: [id number]       Elgie Butter M.D. [facility] 08/24/2024, 5:16 PM  Available via Epic secure chat 7am-7pm After 7 pm, please refer to night coverage provider listed on amion.

## 2024-08-24 NOTE — Progress Notes (Addendum)
 NEUROLOGY CONSULT FOLLOW UP NOTE   Date of service: August 24, 2024 Patient Name: Erik Moss MRN:  [id number] DOB:  Jan 10, 1933  Interval Hx/subjective  Seen and examined No acute changes reported Vitals   Vitals:   08/23/24 2125 08/23/24 2350 08/24/24 0437 08/24/24 0827  BP: (!) 182/79 (!) 183/74 (!) 177/79 (!) 163/82  Pulse:  80 80 79  Resp:   19 16  Temp:  (!) 97.5 F (36.4 C) 97.9 F (36.6 C) 97.6 F (36.4 C)  TempSrc:  Oral Oral Oral  SpO2:  100% 99% 98%  Weight:      Height:         Body mass index is 22.77 kg/m.  Physical Exam   General: In no acute distress  Neurologic Examination  He is awake alert oriented to self He could not tell me his date of birth or age.  He could tell me was the month of October He was able to tell me he is in a daycare and when I corrected him and told him that he is in the hospital, he acknowledged that he knew and probably forgot. Naming intact Attention concentration poor Comprehension intact Repetition intact Mildly dysarthric Cranial nerves II to XII intact Motor examination with antigravity strength in bilateral upper extremities and 2/5 bilateral lower extremities. Sensation intact Coordination examination difficult to assess  Medications  Current Facility-Administered Medications:    acetaminophen  (TYLENOL ) tablet 650 mg, 650 mg, Oral, Q4H PRN, Akula, Vijaya, MD   amLODipine  (NORVASC ) tablet 5 mg, 5 mg, Oral, Daily, Georgina Basket, MD, 5 mg at 08/24/24 1026   atorvastatin  (LIPITOR ) tablet 40 mg, 40 mg, Oral, Daily, Georgina Basket, MD, 40 mg at 08/24/24 1026   memantine  (NAMENDA ) tablet 10 mg, 10 mg, Oral, BID, Georgina Basket, MD, 10 mg at 08/24/24 1026   Oral care mouth rinse, 15 mL, Mouth Rinse, PRN, Georgina Basket, MD   polyethylene glycol (MIRALAX  / GLYCOLAX ) packet 17 g, 17 g, Oral, BID, Georgina Basket, MD, 17 g at 08/24/24 1026   senna-docusate (Senokot-S) tablet 2 tablet, 2 tablet, Oral, BID, Georgina Basket, MD,  2 tablet at 08/24/24 1026   tamsulosin  (FLOMAX ) capsule 0.4 mg, 0.4 mg, Oral, QHS, Moore, Willie, MD  Labs and Diagnostic Imaging   CBC:  Recent Labs  Lab 08/23/24 0707 08/23/24 0708  WBC  --  5.9  NEUTROABS  --  2.5  HGB 10.2* 9.4*  HCT 30.0* 29.6*  MCV  --  96.1  PLT  --  269    Basic Metabolic Panel:  Lab Results  Component Value Date   NA 137 08/23/2024   K 4.3 08/23/2024   CO2 22 08/23/2024   GLUCOSE 172 (H) 08/23/2024   BUN 26 (H) 08/23/2024   CREATININE 2.21 (H) 08/23/2024   CALCIUM  8.9 08/23/2024   GFRNONAA 28 (L) 08/23/2024   GFRAA 40 (L) 10/10/2019   Lipid Panel:  Lab Results  Component Value Date   LDLCALC 97 10/10/2019   HgbA1c:  Lab Results  Component Value Date   HGBA1C 8.3 (H) 06/23/2024   Urine Drug Screen:     Component Value Date/Time   LABOPIA NONE DETECTED 08/23/2021 0828   COCAINSCRNUR NONE DETECTED 08/23/2021 0828   LABBENZ NONE DETECTED 08/23/2021 0828   AMPHETMU NONE DETECTED 08/23/2021 0828   THCU NONE DETECTED 08/23/2021 0828   LABBARB NONE DETECTED 08/23/2021 0828    Alcohol Level     Component Value Date/Time   ETH <15 08/23/2024 0708  INR  Lab Results  Component Value Date   INR 1.1 08/23/2024   APTT  Lab Results  Component Value Date   APTT 29 08/23/2024  Urinalysis not suggestive of UTI   MRI brain without contrast personally reviewed: Negative for acute process EEG: Continuous generalized slowing suggestive of generalized cerebral dysfunction.  Assessment   Erik Moss is a 89 y.o. male past medical history of progressive dementia, DM, HTN, HLD brought in for slurred speech confusion and bilateral lower extremity weakness.  Spoke with the daughter who reports that he has been gradually getting worse. There is no focal deficits noted and imaging and workup thus far is negative I suspect this is progression of his dementia. No further inpatient neurological workup needed at this time   Recommendations   No further inpatient neurological workup Outpatient neurology follow-up and neuropsychological testing Plan discussed with Dr. Cherlyn ______________________________________________________________________   Signed, Eligio Lav, MD [facility]

## 2024-08-24 NOTE — Evaluation (Signed)
 Occupational Therapy Evaluation Patient Details Name: Erik Moss MRN: [id number] DOB: [birth year] Today's Date: 08/24/2024   History of Present Illness   Patient is an 89 yo male presenting to the ED with slurred speech, confusion, and LUE weakness on 08/23/24. CTA clear, MRI negative for acute intracranial abnormality. EEG suggestive of encephalopathy. PMH: CVA, HTN, CKD stage IIIb, HLD, DM-2, dementia, BPH.     Clinical Impressions Prior to this admission, patient attending an adult day service during the day, and walking with a rollator. All information obtained from previous admissions due to patient's mental status. Currently, patient presenting with decreased clarity of speech, generalized weakness, pleasantly confused, and requiring increased assist for ADLs and transfers. Patient min A for ADLs, and requiring up to mod A of 2 to complete functional ambulation. LLE buckling noted with ambulation attempts, with significant forward flexed posture and weight in heels. OT recommending stint at lesser intensity rehab < 3 hours, however if patient has 24/7 support and assistance, patient would benefit from [facility] services.     If plan is discharge home, recommend the following:   Two people to help with walking and/or transfers;A lot of help with bathing/dressing/bathroom;Assistance with cooking/housework;Direct supervision/assist for medications management;Direct supervision/assist for financial management;Assist for transportation;Help with stairs or ramp for entrance;Supervision due to cognitive status     Functional Status Assessment   Patient has had a recent decline in their functional status and demonstrates the ability to make significant improvements in function in a reasonable and predictable amount of time.     Equipment Recommendations   Other (comment) (defer to next venue)     Recommendations for Other Services         Precautions/Restrictions    Precautions Precautions: Fall Recall of Precautions/Restrictions: Impaired Restrictions Weight Bearing Restrictions Per Provider Order: No     Mobility Bed Mobility Overal bed mobility: Needs Assistance Bed Mobility: Supine to Sit, Sit to Supine     Supine to sit: Mod assist Sit to supine: Min assist   General bed mobility comments: Mod A to elevate trunk into sitting, min A to bring BLEs back into bed and reposition once in supine    Transfers Overall transfer level: Needs assistance Equipment used: Rollator (4 wheels) Transfers: Sit to/from Stand, Bed to chair/wheelchair/BSC Sit to Stand: Min assist     Step pivot transfers: Mod assist, +2 physical assistance, +2 safety/equipment     General transfer comment: Min A to power up into standing x2, however weight in heels with significant forward flexed posture requiring increased cues to correct, noted LLE buckling and unable to advance away from bed with rollator, attempting with 2 person HHA, with improvement in stability, however continues to require increased assist for coordination, able to take a few side steps to [facility] with mod A of 2 for balance and safety      Balance Overall balance assessment: Needs assistance Sitting-balance support: Bilateral upper extremity supported, Feet supported Sitting balance-Leahy Scale: Fair Sitting balance - Comments: did not challenge   Standing balance support: Bilateral upper extremity supported, During functional activity, Reliant on assistive device for balance Standing balance-Leahy Scale: Poor Standing balance comment: heavily reliant on external assist                           ADL either performed or assessed with clinical judgement   ADL Overall ADL's : Needs assistance/impaired Eating/Feeding: Set up;Sitting   Grooming: Set up;Wash/dry face;Wash/dry hands;Sitting  Upper Body Bathing: Contact guard assist;Sitting   Lower Body Bathing: Moderate  assistance;Sit to/from stand;Sitting/lateral leans   Upper Body Dressing : Contact guard assist;Sitting   Lower Body Dressing: Moderate assistance;Sitting/lateral leans;Sit to/from stand   Toilet Transfer: Minimal assistance;+2 for physical assistance;+2 for safety/equipment;BSC/3in1 Toilet Transfer Details (indicate cue type and reason): simulated Toileting- Clothing Manipulation and Hygiene: Minimal assistance;Sitting/lateral lean;Sit to/from stand       Functional mobility during ADLs: Minimal assistance;+2 for physical assistance;+2 for safety/equipment;Cueing for safety;Cueing for sequencing General ADL Comments: Prior to this admission, patient attending an adult day service during the day, and walking with a rollator. All information obtained from previous admissions due to patient's mental status. Currently, patient presenting with decreased clarity of speech, generalized weakness, pleasantly confused, and requiring increased assist for ADLs and transfers. Patient min A for ADLs, and requiring up to mod A of 2 to complete functional ambulation. LLE buckling noted with ambulation attempts, with significant forward flexed posture and weight in heels. OT recommending stint at lesser intensity rehab < 3 hours, however if patient has 24/7 support and assistance, patient would benefit from [facility] services.     Vision Baseline Vision/History: 0 No visual deficits Ability to See in Adequate Light: 0 Adequate Patient Visual Report: No change from baseline Vision Assessment?: No apparent visual deficits     Perception Perception: Not tested       Praxis Praxis: Not tested       Pertinent Vitals/Pain Pain Assessment Pain Assessment: No/denies pain     Extremity/Trunk Assessment Upper Extremity Assessment Upper Extremity Assessment: Generalized weakness   Lower Extremity Assessment Lower Extremity Assessment: Defer to PT evaluation   Cervical / Trunk Assessment Cervical / Trunk  Assessment: Kyphotic   Communication Communication Communication: Impaired Factors Affecting Communication: Reduced clarity of speech   Cognition Arousal: Alert Behavior During Therapy: WFL for tasks assessed/performed Cognition: History of cognitive impairments, Difficult to assess             OT - Cognition Comments: Dementia at baseline, converses and answers questions, unsure of accuracy of responses                 Following commands: Impaired Following commands impaired: Only follows one step commands consistently, Follows multi-step commands inconsistently     Cueing  General Comments   Cueing Techniques: Verbal cues  VSS on RA   Exercises     Shoulder Instructions      Home Living Family/patient expects to be discharged to:: Private residence Living Arrangements: Children;Other (Comment) (Daughter, prn housekeeper) Available Help at Discharge: Family;Friend(s) Type of Home: House Home Access: Stairs to enter [employer] of Steps: 2 Entrance Stairs-Rails: Right Home Layout: One level     Bathroom Shower/Tub: Producer, Television/film/video: Standard Bathroom Accessibility: Yes How Accessible: Accessible via walker Home Equipment: Rolling Walker (2 wheels);Rollator (4 wheels);Wheelchair - Engineer, Drilling Comments: Goes to day center during the day/ all information in chart, patient unable to provide      Prior Functioning/Environment Prior Level of Function : Patient poor historian/Family not available             Mobility Comments: frequent falls, ambulates with rollator ADLs Comments: HHA helps with shower and dressing; takes a bus to daycare    OT Problem List: Decreased activity tolerance;Decreased strength;Decreased cognition;Decreased safety awareness;Decreased knowledge of use of DME or AE;Decreased knowledge of precautions   OT Treatment/Interventions: Self-care/ADL  training;Therapeutic exercise;Energy conservation;DME and/or AE  instruction;Manual therapy;Therapeutic activities;Patient/family education;Balance training      OT Goals(Current goals can be found in the care plan section)   Acute Rehab OT Goals Patient Stated Goal: unable OT Goal Formulation: Patient unable to participate in goal setting Time For Goal Achievement: 09/07/24 Potential to Achieve Goals: Good ADL Goals Pt Will Perform Lower Body Bathing: with contact guard assist;sitting/lateral leans;sit to/from stand Pt Will Perform Lower Body Dressing: with contact guard assist;sitting/lateral leans;sit to/from stand Pt Will Transfer to Toilet: with min assist;ambulating;regular height toilet Pt Will Perform Toileting - Clothing Manipulation and hygiene: with min assist;sitting/lateral leans;sit to/from stand Additional ADL Goal #1: Patient will be able to demonstrate increased online awareness in standing to prevent LOB without cues to promote functional independence.   OT Frequency:  Min 2X/week    Co-evaluation   Reason for Co-Treatment: Complexity of the patient's impairments (multi-system involvement);Necessary to address cognition/behavior during functional activity;For patient/therapist safety;To address functional/ADL transfers PT goals addressed during session: Balance;Mobility/safety with mobility;Strengthening/ROM;Proper use of DME OT goals addressed during session: ADL's and self-care;Strengthening/ROM      AM-PAC OT 6 Clicks Daily Activity     Outcome Measure Help from another person eating meals?: A Little Help from another person taking care of personal grooming?: A Little Help from another person toileting, which includes using toliet, bedpan, or urinal?: A Lot Help from another person bathing (including washing, rinsing, drying)?: A Lot Help from another person to put on and taking off regular upper body clothing?: A Little Help from another person to put on and  taking off regular lower body clothing?: A Lot 6 Click Score: 15   End of Session Equipment Utilized During Treatment: Gait belt;Rollator (4 wheels) Nurse Communication: Mobility status;Other (comment) (use stedy for transfers)  Activity Tolerance: Patient tolerated treatment well Patient left: in bed;with call bell/phone within reach;with bed alarm set  OT Visit Diagnosis: Unsteadiness on feet (R26.81);Other abnormalities of gait and mobility (R26.89);Muscle weakness (generalized) (M62.81);History of falling (Z91.81);Other symptoms and signs involving cognitive function                Time: 1357-1418 OT Time Calculation (min): 21 min Charges:  OT General Charges $OT Visit: 1 Visit OT Evaluation $OT Eval Moderate Complexity: 1 Mod  Ronal Gift E. Star Cheese, OTR/L Acute Rehabilitation Services [phone]   Ronal Gift Salt 08/24/2024, 3:12 PM

## 2024-08-24 NOTE — Evaluation (Signed)
 Physical Therapy Evaluation Patient Details Name: Erik Moss MRN: [id number] DOB: 1932-11-08 Today's Date: 08/24/2024  History of Present Illness  Patient is an 89 yo male presenting to the ED with slurred speech, confusion, and LUE weakness on 08/23/24. CTA clear, MRI negative for acute intracranial abnormality. EEG suggestive of encephalopathy. PMH: CVA, HTN, CKD stage IIIb, HLD, DM-2, dementia, BPH.   Clinical Impression  Per chart review, pt attended an adult day service during the day and would ambulate with use of rollator. Pt was A&Ox1 during the session and unable to answer questions. Pt presents with generalized weakness, decreased clarify of speech, and impaired standing balance/gait pattern. Pt required MinA to stand with use of rollator. Unable to safely take steps due to severely forward flexed trunk and difficulty sequencing with rollator. Attempted to take steps with ModAx2 and 2HH with LLE buckling noted with fatigue. Able to minimally correct posture before returning to forward position. Recommending <3hrs post acute rehab to decrease caregiver burden and improve mobility. If pt had 24/7 assist at home, pt would be able to d/c home at [facility] level with HHPT services. Acute PT to follow.         If plan is discharge home, recommend the following: A lot of help with walking and/or transfers;A lot of help with bathing/dressing/bathroom;Assistance with cooking/housework;Direct supervision/assist for financial management;Direct supervision/assist for medications management;Assist for transportation;Help with stairs or ramp for entrance   Can travel by private vehicle   No    Equipment Recommendations None recommended by PT     Functional Status Assessment Patient has had a recent decline in their functional status and demonstrates the ability to make significant improvements in function in a reasonable and predictable amount of time.     Precautions / Restrictions  Precautions Precautions: Fall Recall of Precautions/Restrictions: Impaired Restrictions Weight Bearing Restrictions Per Provider Order: No      Mobility  Bed Mobility Overal bed mobility: Needs Assistance Bed Mobility: Supine to Sit, Sit to Supine    Supine to sit: Mod assist Sit to supine: Min assist   General bed mobility comments: Mod A to elevate trunk into sitting, min A to bring BLEs back into bed and reposition once in supine    Transfers Overall transfer level: Needs assistance Equipment used: Rollator (4 wheels) Transfers: Sit to/from Stand, Bed to chair/wheelchair/BSC Sit to Stand: Min assist   Step pivot transfers: Mod assist, +2 physical assistance, +2 safety/equipment     General transfer comment: Min A to power up into standing x2, however weight in heels with significant forward flexed posture requiring increased cues to correct, noted LLE buckling and unable to advance away from bed with rollator, attempting with 2 person HHA, with improvement in stability, however continues to require increased assist for coordination, able to take a few side steps to [facility] with mod A of 2 for balance and safety      Balance Overall balance assessment: Needs assistance Sitting-balance support: Bilateral upper extremity supported, Feet supported Sitting balance-Leahy Scale: Fair Sitting balance - Comments: did not challenge   Standing balance support: Bilateral upper extremity supported, During functional activity, Reliant on assistive device for balance Standing balance-Leahy Scale: Poor Standing balance comment: heavily reliant on external assist       Pertinent Vitals/Pain Pain Assessment Pain Assessment: No/denies pain    Home Living Family/patient expects to be discharged to:: Private residence Living Arrangements: Children;Other (Comment) (Daughter, prn housekeeper) Available Help at Discharge: Family;Friend(s) Type of Home: House Home  Access: Stairs to  enter Entrance Stairs-Rails: Right Entrance Stairs-Number of Steps: 2   Home Layout: One level Home Equipment: Agricultural Consultant (2 wheels);Rollator (4 wheels);Wheelchair - Counsellor Comments: Goes to day center during the day/ all information in chart, patient unable to provide    Prior Function Prior Level of Function : Patient poor historian/Family not available    Mobility Comments: frequent falls, ambulates with rollator ADLs Comments: HHA helps with shower and dressing; takes a bus to daycare     Extremity/Trunk Assessment   Upper Extremity Assessment Upper Extremity Assessment: Defer to OT evaluation    Lower Extremity Assessment Lower Extremity Assessment: Generalized weakness    Cervical / Trunk Assessment Cervical / Trunk Assessment: Kyphotic  Communication   Communication Communication: Impaired Factors Affecting Communication: Reduced clarity of speech    Cognition Arousal: Alert Behavior During Therapy: WFL for tasks assessed/performed   PT - Cognitive impairments: No family/caregiver present to determine baseline, Orientation, Awareness, Memory, Attention, Sequencing, Problem solving, Safety/Judgement   Orientation impairments: Place, Time, Situation      Following commands: Impaired Following commands impaired: Only follows one step commands consistently, Follows multi-step commands inconsistently     Cueing Cueing Techniques: Verbal cues     General Comments General comments (skin integrity, edema, etc.): VSS on RA     PT Assessment Patient needs continued PT services  PT Problem List Decreased strength;Decreased balance;Decreased activity tolerance;Decreased mobility;Decreased cognition;Decreased knowledge of use of DME;Decreased coordination;Decreased safety awareness       PT Treatment Interventions DME instruction;Gait training;Functional mobility training;Therapeutic activities;Therapeutic  exercise;Balance training;Neuromuscular re-education;Patient/family education    PT Goals (Current goals can be found in the Care Plan section)  Acute Rehab PT Goals Patient Stated Goal: to go home PT Goal Formulation: With patient Time For Goal Achievement: 09/07/24 Potential to Achieve Goals: Fair    Frequency Min 2X/week     Co-evaluation   Reason for Co-Treatment: Complexity of the patient's impairments (multi-system involvement);Necessary to address cognition/behavior during functional activity;For patient/therapist safety;To address functional/ADL transfers PT goals addressed during session: Balance;Mobility/safety with mobility;Strengthening/ROM;Proper use of DME OT goals addressed during session: ADL's and self-care;Strengthening/ROM       AM-PAC PT 6 Clicks Mobility  Outcome Measure Help needed turning from your back to your side while in a flat bed without using bedrails?: A Lot Help needed moving from lying on your back to sitting on the side of a flat bed without using bedrails?: A Lot Help needed moving to and from a bed to a chair (including a wheelchair)?: Total Help needed standing up from a chair using your arms (e.g., wheelchair or bedside chair)?: A Little Help needed to walk in hospital room?: Total Help needed climbing 3-5 steps with a railing? : Total 6 Click Score: 10    End of Session Equipment Utilized During Treatment: Gait belt Activity Tolerance: Patient tolerated treatment well Patient left: in bed;with call bell/phone within reach;with bed alarm set Nurse Communication: Mobility status;Need for lift equipment PT Visit Diagnosis: Unsteadiness on feet (R26.81);Other abnormalities of gait and mobility (R26.89);Muscle weakness (generalized) (M62.81);History of falling (Z91.81)    Time: 8642-8581 PT Time Calculation (min) (ACUTE ONLY): 21 min   Charges:   PT Evaluation $PT Eval Low Complexity: 1 Low   PT General Charges $$ ACUTE PT VISIT: 1  Visit        Kate ORN, PT, DPT Secure Chat Preferred  Rehab Office [phone]   Kate BRAVO Wendolyn 08/24/2024, 3:22 PM

## 2024-08-24 NOTE — Evaluation (Signed)
 Clinical/Bedside Swallow Evaluation Patient Details  Name: Erik Moss MRN: [id number] Date of Birth: 1933/10/17  Today's Date: 08/24/2024 Time: SLP Start Time (ACUTE ONLY): 0912 SLP Stop Time (ACUTE ONLY): 9077 SLP Time Calculation (min) (ACUTE ONLY): 10 min  Past Medical History:  Past Medical History:  Diagnosis Date   BPH (benign prostatic hyperplasia) 10/08/2019   Diabetes mellitus without complication (HCC)    Hyperlipidemia 10/08/2019   Hypertension 10/08/2019   Renal disease    Stroke [facility])    TIA (transient ischemic attack)    Type 2 diabetes mellitus (HCC) 10/08/2019   Past Surgical History:  Past Surgical History:  Procedure Laterality Date   BUBBLE STUDY  10/10/2019   Procedure: BUBBLE STUDY;  Surgeon: Barbaraann Darryle Ned, MD;  Location: [name] ENDOSCOPY;  Service: Cardiology;;   TEE WITHOUT CARDIOVERSION N/A 10/10/2019   Procedure: TRANSESOPHAGEAL ECHOCARDIOGRAM (TEE);  Surgeon: Barbaraann Darryle Ned, MD;  Location: [facility] ENDOSCOPY;  Service: Cardiology;  Laterality: N/A;   HPI:  89 yo male with history of HTN, HLD, T2DM, BPH, CKD 3B, and dementia presenting to ED 10/31 with transient LUE weakness, persistent LLE weakness, dysarthria, and incontinence. MRI negative for acute intracranial abnormality. EEG suggestive of encephalopathy. The swallow screen was not completed due to lethargy and SLP was consulted. Functional clinical evaluation 09/10/23 after being consulted for reduced PO intake, which is felt to be a part of the natural progression of dementia.    Assessment / Plan / Recommendation  Clinical Impression  Pt is alert and following commands. Given set up, he fed himself purees and regular solids with prompt oral transit and no significant residue. One instance of coughing was observed after the initial sip of water but this did not recur in any further trials, including during the 3 oz water test. Recommend regular diet and thin liquids with full supervision  in light of potential for fluctuating mentation. SLP will f/u at least briefly but do not anticipate post-acute needs. SLP Visit Diagnosis: Dysphagia, unspecified (R13.10)    Aspiration Risk  Mild aspiration risk    Diet Recommendation Regular;Thin liquid    Liquid Administration via: Cup;Straw Medication Administration: Whole meds with liquid Supervision: Staff to assist with self feeding;Full supervision/cueing for compensatory strategies Compensations: Minimize environmental distractions;Slow rate;Small sips/bites Postural Changes: Seated upright at 90 degrees    Other  Recommendations Oral Care Recommendations: Oral care BID     Assistance Recommended at Discharge    Functional Status Assessment Patient has had a recent decline in their functional status and demonstrates the ability to make significant improvements in function in a reasonable and predictable amount of time.  Frequency and Duration min 2x/week  1 week       Prognosis Prognosis for improved oropharyngeal function: Good Barriers to Reach Goals: Cognitive deficits      Swallow Study   General HPI: 89 yo male with history of HTN, HLD, T2DM, BPH, CKD 3B, and dementia presenting to ED 10/31 with transient LUE weakness, persistent LLE weakness, dysarthria, and incontinence. MRI negative for acute intracranial abnormality. EEG suggestive of encephalopathy. The swallow screen was not completed due to lethargy and SLP was consulted. Functional clinical evaluation 09/10/23 after being consulted for reduced PO intake, which is felt to be a part of the natural progression of dementia. Type of Study: Bedside Swallow Evaluation Previous Swallow Assessment: see HPI Diet Prior to this Study: NPO Temperature Spikes Noted: No Respiratory Status: Room air History of Recent Intubation: No Behavior/Cognition: Alert;Cooperative Oral Cavity Assessment:  Within Functional Limits Oral Care Completed by SLP: No Oral Cavity -  Dentition: Adequate natural dentition (missing one lower tooth) Vision: Functional for self-feeding Self-Feeding Abilities: Able to feed self;Needs set up Patient Positioning: Upright in bed Baseline Vocal Quality: Normal Volitional Cough: Strong Volitional Swallow: Able to elicit    Oral/Motor/Sensory Function Overall Oral Motor/Sensory Function: Within functional limits   Ice Chips Ice chips: Not tested   Thin Liquid Thin Liquid: Impaired Presentation: Straw Pharyngeal  Phase Impairments: Cough - Immediate    Nectar Thick Nectar Thick Liquid: Not tested   Honey Thick Honey Thick Liquid: Not tested   Puree Puree: Within functional limits Presentation: Spoon;Self Fed   Solid     Solid: Within functional limits Presentation: Self Fed      Damien Blumenthal, M.A., CCC-SLP Speech Language Pathology, Acute Rehabilitation Services  Secure Chat preferred [phone]  08/24/2024,9:42 AM

## 2024-08-24 NOTE — Progress Notes (Signed)
   08/23/24 2142  Provider Notification  Provider Name/Title C. Shona MD  Date Provider Notified 08/23/24  Time Provider Notified 2142  Notification Reason Other (Comment) (BP elevated over 180. [facility] orders still on file. Remains NPO due to drowsy)  Provider response Other (Comment) (d/c nih)  Date of Provider Response 08/23/24  Time of Provider Response 2145

## 2024-08-24 NOTE — Plan of Care (Signed)

## 2024-08-25 DIAGNOSIS — N400 Enlarged prostate without lower urinary tract symptoms: Secondary | ICD-10-CM | POA: Diagnosis not present

## 2024-08-25 DIAGNOSIS — G9340 Encephalopathy, unspecified: Secondary | ICD-10-CM | POA: Diagnosis not present

## 2024-08-25 DIAGNOSIS — I10 Essential (primary) hypertension: Secondary | ICD-10-CM | POA: Diagnosis not present

## 2024-08-25 DIAGNOSIS — E785 Hyperlipidemia, unspecified: Secondary | ICD-10-CM | POA: Diagnosis not present

## 2024-08-25 LAB — CBC WITH DIFFERENTIAL/PLATELET
Abs Immature Granulocytes: 0.02 K/uL (ref 0.00–0.07)
Basophils Absolute: 0 K/uL (ref 0.0–0.1)
Basophils Relative: 0 %
Eosinophils Absolute: 0.1 K/uL (ref 0.0–0.5)
Eosinophils Relative: 2 %
HCT: 35.6 % — ABNORMAL LOW (ref 39.0–52.0)
Hemoglobin: 11.7 g/dL — ABNORMAL LOW (ref 13.0–17.0)
Immature Granulocytes: 0 %
Lymphocytes Relative: 41 %
Lymphs Abs: 3 K/uL (ref 0.7–4.0)
MCH: 30.8 pg (ref 26.0–34.0)
MCHC: 32.9 g/dL (ref 30.0–36.0)
MCV: 93.7 fL (ref 80.0–100.0)
Monocytes Absolute: 0.6 K/uL (ref 0.1–1.0)
Monocytes Relative: 8 %
Neutro Abs: 3.6 K/uL (ref 1.7–7.7)
Neutrophils Relative %: 49 %
Platelets: 300 K/uL (ref 150–400)
RBC: 3.8 MIL/uL — ABNORMAL LOW (ref 4.22–5.81)
RDW: 14.3 % (ref 11.5–15.5)
WBC: 7.3 K/uL (ref 4.0–10.5)
nRBC: 0 % (ref 0.0–0.2)

## 2024-08-25 LAB — HEMOGLOBIN A1C
Hgb A1c MFr Bld: 7.8 % — ABNORMAL HIGH (ref 4.8–5.6)
Mean Plasma Glucose: 177.16 mg/dL

## 2024-08-25 LAB — BASIC METABOLIC PANEL WITH GFR
Anion gap: 10 (ref 5–15)
BUN: 37 mg/dL — ABNORMAL HIGH (ref 8–23)
CO2: 24 mmol/L (ref 22–32)
Calcium: 9.1 mg/dL (ref 8.9–10.3)
Chloride: 99 mmol/L (ref 98–111)
Creatinine, Ser: 2.43 mg/dL — ABNORMAL HIGH (ref 0.61–1.24)
GFR, Estimated: 25 mL/min — ABNORMAL LOW (ref >60)
Glucose, Bld: 226 mg/dL — ABNORMAL HIGH (ref 70–99)
Potassium: 5.2 mmol/L — ABNORMAL HIGH (ref 3.5–5.1)
Sodium: 133 mmol/L — ABNORMAL LOW (ref 135–145)

## 2024-08-25 LAB — VITAMIN B12: Vitamin B-12: 731 pg/mL (ref 180–914)

## 2024-08-25 LAB — TSH: TSH: 2.49 u[IU]/mL (ref 0.350–4.500)

## 2024-08-25 LAB — FOLATE: Folate: 12.2 ng/mL (ref >5.9)

## 2024-08-25 MED ORDER — LACTATED RINGERS IV SOLN: INTRAVENOUS | Status: DC

## 2024-08-25 MED ORDER — SODIUM CHLORIDE 0.9 % IV SOLN: INTRAVENOUS | Status: DC

## 2024-08-25 MED ORDER — SODIUM ZIRCONIUM CYCLOSILICATE 10 G PO PACK
10.0000 g | PACK | Freq: Two times a day (BID) | ORAL | Status: AC
  Administered 2024-08-25 (×2): 10 g via ORAL
  Filled 2024-08-25 (×2): qty 1

## 2024-08-25 NOTE — TOC Initial Note (Signed)
 Transition of Care [facility]) - Initial/Assessment Note    Patient Details  Name: Erik Moss MRN: [id number] Date of Birth: 03/02/1933  Transition [facility] Care [facility]) CM/SW Contact:    Almarie CHRISTELLA Goodie, LCSW Phone Number: 08/25/2024, 11:03 AM  Clinical Narrative:        CSW spoke with daughter, Vina, to discuss disposition. Patient from home with daughter, who works during the day and patient attends adult daycare. Patient recently at [facility] for rehab, discharged home last Monday and was ambulatory with a walker. Patient now with increased weakness, daughter said she had to even help the patient hold his fork yesterday to eat while she visited, when he was previously able to independently feed himself. Daughter in agreement with return to SNF, preference for [facility] as she is familiar with them. Daughter asked about insurance copays, and CSW answered questions. CSW completed referral and faxed out, awaiting response from [facility] to start insurance authorization.           Expected Discharge Plan: Skilled Nursing Facility Barriers to Discharge: Continued Medical Work up, English As A Second Language Teacher   Patient Goals and CMS Choice Patient states their goals for this hospitalization and ongoing recovery are:: patient unable to participate in goal setting, not fully oriented CMS Medicare.gov Compare Post Acute Care list provided to:: Patient Represenative (must comment) Choice offered to / list presented to : Adult Children [place] ownership interest in [facility].provided to:: Adult Children    Expected Discharge Plan and Services     Post Acute Care Choice: Skilled Nursing Facility Living arrangements for the past 2 months: Single Family Home                                      Prior Living Arrangements/Services Living arrangements for the past 2 months: Single Family Home Lives with:: Adult Children Patient language and need for interpreter reviewed::  No Do you feel safe going back to the place where you live?: Yes      Need for Family Participation in Patient Care: Yes (Comment) Care giver support system in place?: No (comment)   Criminal Activity/Legal Involvement Pertinent to Current Situation/Hospitalization: No - Comment as needed  Activities of Daily Living      Permission Sought/Granted Permission sought to share information with : Facility Medical Sales Representative, Family Supports Permission granted to share information with : Yes, Verbal Permission Granted  Share Information with NAME: Vina  Permission granted to share info w AGENCY: SNF  Permission granted to share info w Relationship: Daughter     Emotional Assessment   Attitude/Demeanor/Rapport: Unable to Assess Affect (typically observed): Unable to Assess Orientation: : Oriented to Self, Oriented to Place Alcohol / Substance Use: Not Applicable Psych Involvement: No (comment)  Admission diagnosis:  Acute encephalopathy [G93.40] Patient Active Problem List   Diagnosis Date Noted   Acute encephalopathy 08/23/2024   Generalized weakness 06/23/2024   Unresponsive episode 06/22/2024   Hypernatremia 09/09/2023   Dementia without behavioral disturbance, psychotic disturbance, mood disturbance, or anxiety (HCC) 11/30/2021   Urinary incontinence 11/30/2021   Gait abnormality 11/30/2021   Dementia without behavioral disturbance (HCC) 02/23/2021   Cerebrovascular accident (CVA) (HCC) 02/23/2021   Hyperglycemia due to diabetes mellitus (HCC) 09/24/2020   Acute CVA (cerebrovascular accident) (HCC) 10/08/2019   Type 2 diabetes mellitus (HCC) 10/08/2019   Hyperlipidemia 10/08/2019   BPH (benign prostatic hyperplasia) 10/08/2019   Hypertension 10/08/2019  ARF (acute renal failure) 05/08/2019   PCP:  Roanna Ezekiel NOVAK, MD Pharmacy:   [facility] [address], [place] - [address] [place] [address] Phone: [phone] Fax:  [phone]  [facility] [id number] [place], [place] - [address] [place] [address] Phone: [phone] Fax: [phone]     Social Drivers of Health (SDOH) Social History: SDOH Screenings   Food Insecurity: Unknown (08/23/2024)  Housing: Unknown (08/23/2024)  Transportation Needs: Unknown (08/23/2024)  Utilities: Patient Unable To Answer (08/23/2024)  Depression (PHQ2-9): Low Risk  (11/19/2019)  Social Connections: Unknown (08/23/2024)  Recent Concern: Social Connections - Socially Isolated (06/23/2024)  Tobacco Use: Low Risk  (08/23/2024)   SDOH Interventions:     Readmission Risk Interventions     No data to display

## 2024-08-25 NOTE — Plan of Care (Signed)
   Problem: Coping: Goal: Level of anxiety will decrease Outcome: Progressing

## 2024-08-25 NOTE — Plan of Care (Signed)
  Problem: Clinical Measurements: Goal: Respiratory complications will improve Outcome: Progressing Goal: Cardiovascular complication will be avoided Outcome: Progressing   Problem: Nutrition: Goal: Adequate nutrition will be maintained Outcome: Progressing   Problem: Elimination: Goal: Will not experience complications related to bowel motility Outcome: Progressing Goal: Will not experience complications related to urinary retention Outcome: Progressing   Problem: Skin Integrity: Goal: Risk for impaired skin integrity will decrease Outcome: Progressing

## 2024-08-25 NOTE — Progress Notes (Signed)
 [employer], is a 89 y.o. male, DOB - [birth year], FMW:[id number] Admit date - 08/23/2024    Outpatient Primary MD for the patient is Roanna Ezekiel NOVAK, MD  LOS - 2  days    Brief summary    Erik Moss is a 89 y.o. male with medical history significant of HTN, HLD, DM2, BPH, CKD stage IIIb, dementia, and recent admission in 06/2024 for severe orthostatic hypotension with  syncope for which pt was started on midodrine  p/w acute encephalopathy. As per the daughter patient was recently discharged on MOnday to home , was brought in for confusion, generalized weakness and some speech abnormalities.    acute neurological event ruled out with a negative MRI.  Patient with no focal deficits.    Assessment & Plan    Acute encephalopathy possibly from worsening dementia: MRI brain is negative for acute stroke.  Patient this am, is alert and is eating breakfast though clumsily.  He is oriented to person only, confused on his whereabouts. He is able to recognize his daughter.  No fever or signs of infection.   TSH, vit b12, and folate levels are wnl. Vit b1 level Therapy eval ordered recommending SNF.     Mild acute on Stage 3b CKD Creatinine slightly worse thatn baseline.  Gently hydrate and repeat renal parameters in am.     Hypertension Positive orthostatics , stop and hold the amlodipine .    Hyperlipidemia Resume lipitor .    Dementia Resume Namenda .    BPH  On flomax .   H/o type 2 DM A1c is 7.8%   Hyperkalemia Lokelma  ordered.  Repeat renal parameters in am.    Estimated body mass index is 22.77 kg/m as calculated from the following:   Height as of this encounter: 5' 6 (1.676 m).   Weight as of this encounter: 64 kg.  Code Status: full code.  DVT Prophylaxis:  SCDs Start: 08/23/24 0934   Level of Care: Level of care: Telemetry Family  Communication: Updated patient's family over the phone.   Disposition Plan:     Remains inpatient appropriate:  pending clinical improvement.   Procedures:  MRI brain.  CXR   Consultants:   Neurology.   Antimicrobials:   Anti-infectives (From admission, onward)    None        Medications  Scheduled Meds:  atorvastatin   40 mg Oral Daily   clopidogrel   75 mg Oral Daily   memantine   10 mg Oral BID   polyethylene glycol  17 g Oral BID   senna-docusate  2 tablet Oral BID   sodium zirconium cyclosilicate   10 g Oral BID   tamsulosin   0.4 mg Oral QHS   Continuous Infusions:  lactated ringers      PRN Meds:.acetaminophen , mouth rinse    Subjective:   Erik Moss was seen and examined today. 2 Bm last night. No new events.   Objective:   Vitals:   08/24/24 1954 08/25/24 0400 08/25/24 0807 08/25/24 1213  BP: 116/66 (!) 147/76 (!) 154/70 117/71  Pulse: 76 72 68 75  Resp: 16 16 18 16   Temp: 98 F (36.7 C) 98.4 F (36.9 C) 98 F (36.7 C) 98.6 F (37 C)  TempSrc: Oral Oral Oral Oral  SpO2: 100% 99% 100% 99%  Weight:      Height:        Intake/Output Summary (Last 24 hours) at 08/25/2024 1327 Last data filed at 08/25/2024 0400 Gross per 24 hour  Intake --  Output 1350 ml  Net -1350 ml   Filed Weights   08/23/24 0700 08/23/24 0742  Weight: 64.4 kg 64 kg     Exam General exam: Appears calm and comfortable  Respiratory system: Clear to auscultation. Respiratory effort normal. Cardiovascular system: S1 & S2 heard, RRR.  Gastrointestinal system: Abdomen is soft bs+ Central nervous system: Alert and oriented to self only. Generalized weakness. No focal deficits.  Extremities: Symmetric 5 x 5 power. Skin: No rashes,    Data Reviewed:  I have personally reviewed following labs and imaging studies   CBC Lab Results  Component Value Date   WBC 7.3 08/25/2024   RBC 3.80 (L) 08/25/2024   HGB 11.7 (L) 08/25/2024   HCT 35.6 (L) 08/25/2024   MCV 93.7  08/25/2024   MCH 30.8 08/25/2024   PLT 300 08/25/2024   MCHC 32.9 08/25/2024   RDW 14.3 08/25/2024   LYMPHSABS 3.0 08/25/2024   MONOABS 0.6 08/25/2024   EOSABS 0.1 08/25/2024   BASOSABS 0.0 08/25/2024     Last metabolic panel Lab Results  Component Value Date   NA 133 (L) 08/25/2024   K 5.2 (H) 08/25/2024   CL 99 08/25/2024   CO2 24 08/25/2024   BUN 37 (H) 08/25/2024   CREATININE 2.43 (H) 08/25/2024   GLUCOSE 226 (H) 08/25/2024   GFRNONAA 25 (L) 08/25/2024   GFRAA 40 (L) 10/10/2019   CALCIUM  9.1 08/25/2024   PHOS 2.9 09/25/2020   PROT 6.2 (L) 08/23/2024   ALBUMIN 3.4 (L) 08/23/2024   LABGLOB 2.2 05/09/2019   AGRATIO 1.6 05/09/2019   BILITOT 0.6 08/23/2024   ALKPHOS 90 08/23/2024   AST 14 (L) 08/23/2024   ALT 16 08/23/2024   ANIONGAP 10 08/25/2024    CBG (last 3)  Recent Labs    08/23/24 0700  GLUCAP 164*      Coagulation Profile: Recent Labs  Lab 08/23/24 0708  INR 1.1     Radiology Studies: No results found.      Elgie Butter M.D. [facility] 08/25/2024, 1:27 PM  Available via Epic secure chat 7am-7pm After 7 pm, please refer to night coverage provider listed on amion.

## 2024-08-25 NOTE — NC FL2 (Signed)
 [employer]  MEDICAID FL2 LEVEL OF CARE FORM     IDENTIFICATION  Patient Name: Erik Moss Birthdate: Sep 15, 1933 Sex: male Admission Date (Current Location): 08/23/2024  [facility] and [place] Number:  Producer, Television/film/video and Address:  [facility], [address] [place], [place] [address]      Provider Number: [id number]  Attending Physician Name and Address:  Cherlyn Labella, MD  Relative Name and Phone Number:       Current Level of Care: Hospital Recommended Level of Care: Skilled Nursing Facility Prior Approval Number:    Date Approved/Denied:   PASRR Number: [id number]  Discharge Plan: SNF    Current Diagnoses: Patient Active Problem List   Diagnosis Date Noted   Acute encephalopathy 08/23/2024   Generalized weakness 06/23/2024   Unresponsive episode 06/22/2024   Hypernatremia 09/09/2023   Dementia without behavioral disturbance, psychotic disturbance, mood disturbance, or anxiety (HCC) 11/30/2021   Urinary incontinence 11/30/2021   Gait abnormality 11/30/2021   Dementia without behavioral disturbance (HCC) 02/23/2021   Cerebrovascular accident (CVA) (HCC) 02/23/2021   Hyperglycemia due to diabetes mellitus (HCC) 09/24/2020   Acute CVA (cerebrovascular accident) (HCC) 10/08/2019   Type 2 diabetes mellitus (HCC) 10/08/2019   Hyperlipidemia 10/08/2019   BPH (benign prostatic hyperplasia) 10/08/2019   Hypertension 10/08/2019   ARF (acute renal failure) 05/08/2019    Orientation RESPIRATION BLADDER Height & Weight     Self, Place  Normal Incontinent Weight: 141 lb 1.5 oz (64 kg) Height:  5' 6 (167.6 cm)  BEHAVIORAL SYMPTOMS/MOOD NEUROLOGICAL BOWEL NUTRITION STATUS      Incontinent Diet (regular)  AMBULATORY STATUS COMMUNICATION OF NEEDS Skin   Extensive Assist Verbally Normal                       Personal Care Assistance Level of Assistance  Bathing, Feeding, Dressing Bathing Assistance: Maximum assistance Feeding  assistance: Limited assistance Dressing Assistance: Maximum assistance     Functional Limitations Info             SPECIAL CARE FACTORS FREQUENCY  PT (By licensed PT), OT (By licensed OT)     PT Frequency: 5x/wk OT Frequency: 5x/wk            Contractures Contractures Info: Not present    Additional Factors Info  Code Status, Allergies Code Status Info: Full Allergies Info: NKA           Current Medications (08/25/2024):  This is the current hospital active medication list Current Facility-Administered Medications  Medication Dose Route Frequency Provider Last Rate Last Admin   acetaminophen  (TYLENOL ) tablet 650 mg  650 mg Oral Q4H PRN Akula, Vijaya, MD       amLODipine  (NORVASC ) tablet 5 mg  5 mg Oral Daily Georgina Basket, MD   5 mg at 08/25/24 9096   atorvastatin  (LIPITOR ) tablet 40 mg  40 mg Oral Daily Georgina Basket, MD   40 mg at 08/25/24 9096   clopidogrel  (PLAVIX ) tablet 75 mg  75 mg Oral Daily Akula, Vijaya, MD   75 mg at 08/25/24 9096   memantine  (NAMENDA ) tablet 10 mg  10 mg Oral BID Georgina Basket, MD   10 mg at 08/25/24 9096   Oral care mouth rinse  15 mL Mouth Rinse PRN Georgina Basket, MD       polyethylene glycol (MIRALAX  / GLYCOLAX ) packet 17 g  17 g Oral BID Georgina Basket, MD   17 g at 08/24/24 2128  senna-docusate (Senokot-S) tablet 2 tablet  2 tablet Oral BID Georgina Basket, MD   2 tablet at 08/25/24 9095   sodium zirconium cyclosilicate  (LOKELMA ) packet 10 g  10 g Oral BID Akula, Vijaya, MD   10 g at 08/25/24 0944   tamsulosin  (FLOMAX ) capsule 0.4 mg  0.4 mg Oral QHS Moore, Willie, MD   0.4 mg at 08/24/24 2128     Discharge Medications: Please see discharge summary for a list of discharge medications.  Relevant Imaging Results:  Relevant Lab Results:   Additional Information SS#: [id number]  Almarie CHRISTELLA Goodie, LCSW

## 2024-08-26 ENCOUNTER — Inpatient Hospital Stay (HOSPITAL_COMMUNITY)

## 2024-08-26 DIAGNOSIS — I10 Essential (primary) hypertension: Secondary | ICD-10-CM | POA: Diagnosis not present

## 2024-08-26 DIAGNOSIS — N400 Enlarged prostate without lower urinary tract symptoms: Secondary | ICD-10-CM | POA: Diagnosis not present

## 2024-08-26 DIAGNOSIS — E785 Hyperlipidemia, unspecified: Secondary | ICD-10-CM | POA: Diagnosis not present

## 2024-08-26 DIAGNOSIS — G9340 Encephalopathy, unspecified: Secondary | ICD-10-CM | POA: Diagnosis not present

## 2024-08-26 LAB — RAPID URINE DRUG SCREEN, HOSP PERFORMED
Amphetamines: NOT DETECTED
Barbiturates: NOT DETECTED
Benzodiazepines: NOT DETECTED
Cocaine: NOT DETECTED
Opiates: NOT DETECTED
Tetrahydrocannabinol: NOT DETECTED

## 2024-08-26 LAB — SODIUM, URINE, RANDOM: Sodium, Ur: 51 mmol/L

## 2024-08-26 LAB — BASIC METABOLIC PANEL WITH GFR
Anion gap: 10 (ref 5–15)
BUN: 40 mg/dL — ABNORMAL HIGH (ref 8–23)
CO2: 22 mmol/L (ref 22–32)
Calcium: 8.2 mg/dL — ABNORMAL LOW (ref 8.9–10.3)
Chloride: 104 mmol/L (ref 98–111)
Creatinine, Ser: 2.62 mg/dL — ABNORMAL HIGH (ref 0.61–1.24)
GFR, Estimated: 23 mL/min — ABNORMAL LOW (ref >60)
Glucose, Bld: 279 mg/dL — ABNORMAL HIGH (ref 70–99)
Potassium: 4.3 mmol/L (ref 3.5–5.1)
Sodium: 136 mmol/L (ref 135–145)

## 2024-08-26 LAB — CREATININE, URINE, RANDOM: Creatinine, Urine: 41 mg/dL

## 2024-08-26 NOTE — Inpatient Diabetes Management (Signed)
 Inpatient Diabetes Program Recommendations  AACE/ADA: New Consensus Statement on Inpatient Glycemic Control (2015)  Target Ranges:  Prepandial:   less than 140 mg/dL      Peak postprandial:   less than 180 mg/dL (1-2 hours)      Critically ill patients:  140 - 180 mg/dL   Lab Results  Component Value Date   GLUCAP 164 (H) 08/23/2024   HGBA1C 7.8 (H) 08/25/2024    Review of Glycemic Control  Latest Reference Range & Units 08/23/24 07:07 08/23/24 07:08 08/25/24 03:16 08/26/24 11:35  Glucose 70 - 99 mg/dL 827 (H) 827 (H) 773 (H) 279 (H)  (H): Data is abnormally high  Diabetes history: DM2 Outpatient Diabetes medications: Ozempic weekly, Januvia 25 mg QD Current orders for Inpatient glycemic control: none  Inpatient Diabetes Program Recommendations:    Might consider:  Novolog  0-9 unit TID.    Thank you, Wyvonna Pinal, MSN, CDCES Diabetes Coordinator Inpatient Diabetes Program [phone] (team pager from 8a-5p)

## 2024-08-26 NOTE — Plan of Care (Signed)
  Problem: Clinical Measurements: Goal: Respiratory complications will improve Outcome: Adequate for Discharge   

## 2024-08-26 NOTE — Progress Notes (Addendum)
 Speech Language Pathology Treatment: Dysphagia  Patient Details Name: Erik Moss MRN: [id number] DOB: 1933-01-18 Today's Date: 08/26/2024 Time: 8882-8874 SLP Time Calculation (min) (ACUTE ONLY): 8 min  Assessment / Plan / Recommendation Clinical Impression  Pt repositioned upright and is appropriate to continue regular texture, thin liquids, pills with thin and no further follow up needed.    He coughed once after sitting up prior to po's and denied difficulty with meals. He consumed close to 3 oz water consecutively without immediate s/s aspiration ans 2 oz at end of session without difficulty. There was one subtle throat clear, several belches denying having GER or esophageal involvement. Mastication with regular texture was prompt and complete. Advised he may want to stay upright after eating for 30 min.    HPI HPI: 89 yo male with history of HTN, HLD, T2DM, BPH, CKD 3B, and dementia presenting to ED 10/31 with transient LUE weakness, persistent LLE weakness, dysarthria, and incontinence. MRI negative for acute intracranial abnormality. EEG suggestive of encephalopathy. The swallow screen was not completed due to lethargy and SLP was consulted. Functional clinical evaluation 09/10/23 after being consulted for reduced PO intake, which is felt to be a part of the natural progression of dementia.      SLP Plan  All goals met;Discharge SLP treatment due to (comment)          Recommendations  Diet recommendations: Regular;Thin liquid Liquids provided via: Cup;Straw Medication Administration: Whole meds with liquid Supervision: Patient able to self feed Compensations: Slow rate;Small sips/bites Postural Changes and/or Swallow Maneuvers: Seated upright 90 degrees                  Oral care BID   None Dysphagia, unspecified (R13.10)     All goals met;Discharge SLP treatment due to (comment)     Dustin Olam Bull  08/26/2024, 11:37 AM

## 2024-08-26 NOTE — Plan of Care (Signed)
  Problem: Clinical Measurements: Goal: Ability to maintain clinical measurements within normal limits will improve Outcome: Progressing Goal: Will remain free from infection Outcome: Progressing   Problem: Nutrition: Goal: Adequate nutrition will be maintained Outcome: Progressing   Problem: Coping: Goal: Level of anxiety will decrease Outcome: Progressing   Problem: Elimination: Goal: Will not experience complications related to urinary retention Outcome: Progressing   Problem: Pain Managment: Goal: General experience of comfort will improve and/or be controlled Outcome: Progressing   Problem: Safety: Goal: Ability to remain free from injury will improve Outcome: Progressing

## 2024-08-26 NOTE — TOC Progression Note (Signed)
 Transition of Care [facility]) - Progression Note    Patient Details  Name: Erik Moss MRN: [id number] Date of Birth: Mar 13, 1933  [facility]) CM/SW Contact  Almarie CHRISTELLA Goodie, [place] Phone Number: 08/26/2024, 10:15 AM  Clinical Narrative:   CSW contacted [facility] to ask them to review referral, awaiting response. CSW also spoke with daughter, Vina, again to answer questions. CSW to follow.    Expected Discharge Plan: Skilled Nursing Facility Barriers to Discharge: Continued Medical Work up, English As A Second Language Teacher               Expected Discharge Plan and Services     Post Acute Care Choice: Skilled Nursing Facility Living arrangements for the past 2 months: Single Family Home                                       Social Drivers of Health (SDOH) Interventions SDOH Screenings   Food Insecurity: Unknown (08/23/2024)  Housing: Unknown (08/23/2024)  Transportation Needs: Unknown (08/23/2024)  Utilities: Patient Unable To Answer (08/23/2024)  Depression (PHQ2-9): Low Risk  (11/19/2019)  Social Connections: Unknown (08/23/2024)  Recent Concern: Social Connections - Socially Isolated (06/23/2024)  Tobacco Use: Low Risk  (08/23/2024)    Readmission Risk Interventions     No data to display

## 2024-08-26 NOTE — Progress Notes (Signed)
 [employer], is a 89 y.o. male, DOB - 02-Oct-1933, FMW:[id number] Admit date - 08/23/2024    Outpatient Primary MD for the patient is Roanna Ezekiel NOVAK, MD  LOS - 3  days    Brief summary    Erik Moss is a 89 y.o. male with medical history significant of HTN, HLD, DM2, BPH, CKD stage IIIb, dementia, and recent admission in 06/2024 for severe orthostatic hypotension with  syncope for which pt was started on midodrine  p/w acute encephalopathy. As per the daughter patient was recently discharged on MOnday to home , was brought in for confusion, generalized weakness and some speech abnormalities.   Acute neurological event ruled out with a negative MRI.  Patient with no focal deficits. No focal deficits.    Assessment & Plan    Acute encephalopathy possibly from worsening dementia: MRI brain is negative for acute stroke.  Patient this am, is alert  He is oriented to person, place .  No fever or signs of infection.   TSH, vit b12, and folate levels are wnl. Vit b1 level is pending.  Therapy eval ordered recommending SNF.     Mild acute on Stage 3b CKD Creatinine slightly worse thatn baseline.  Gently hydrate and repeat renal parameters did not show any improvement.  US  RENAL ordered and pending.  Get UA and urine output is adequate.     Hypertension Bp parameters are optimal.    Hyperlipidemia Resume lipitor .    Dementia Resume Namenda .    BPH  On flomax .   H/o type 2 DM A1c is 7.8%   Hyperkalemia Lokelma  ordered.  Repeat potassium is wnl.    Estimated body mass index is 22.77 kg/m as calculated from the following:   Height as of this encounter: 5' 6 (1.676 m).   Weight as of this encounter: 64 kg.  Code Status: full code.  DVT Prophylaxis:  SCDs Start: 08/23/24 0934   Level of Care: Level of care: Telemetry Family Communication:  Updated patient's family over the phone.   Disposition Plan:     Remains inpatient appropriate:  pending clinical improvement.   Procedures:  MRI brain.  CXR   Consultants:   Neurology.   Antimicrobials:   Anti-infectives (From admission, onward)    None        Medications  Scheduled Meds:  atorvastatin   40 mg Oral Daily   clopidogrel   75 mg Oral Daily   memantine   10 mg Oral BID   polyethylene glycol  17 g Oral BID   senna-docusate  2 tablet Oral BID   tamsulosin   0.4 mg Oral QHS   Continuous Infusions:  sodium chloride  75 mL/hr at 08/26/24 0358   PRN Meds:.acetaminophen , mouth rinse    Subjective:   Erik Moss was seen and examined today. No new complaints.   Objective:   Vitals:   08/25/24 1949 08/26/24 0446 08/26/24 0835 08/26/24 1329  BP: (!) 109/56 (!) 141/71 (!) 150/68 (!) 146/66  Pulse: 70 66 68 73  Resp:  14 14 18 18   Temp: 98.8 F (37.1 C) 97.6 F (36.4 C) 98 F (36.7 C) 98.3 F (36.8 C)  TempSrc: Oral Oral Oral Oral  SpO2: 99% 99% 99% 100%  Weight:      Height:        Intake/Output Summary (Last 24 hours) at 08/26/2024 1348 Last data filed at 08/26/2024 1334 Gross per 24 hour  Intake 1307.14 ml  Output 2250 ml  Net -942.86 ml   Filed Weights   08/23/24 0700 08/23/24 0742  Weight: 64.4 kg 64 kg     Exam General exam: Appears calm and comfortable  Respiratory system: Clear to auscultation. Respiratory effort normal. Cardiovascular system: S1 & S2 heard, RRR.  Gastrointestinal system: Abdomen is nondistended, soft and nontender. Central nervous system: more alert and oriented this am, no focal deficits  Extremities: no edema.  Skin: No rashes,  Psychiatry:  Mood & affect appropriate.    Data Reviewed:  I have personally reviewed following labs and imaging studies   CBC Lab Results  Component Value Date   WBC 7.3 08/25/2024   RBC 3.80 (L) 08/25/2024   HGB 11.7 (L) 08/25/2024   HCT 35.6 (L) 08/25/2024   MCV 93.7  08/25/2024   MCH 30.8 08/25/2024   PLT 300 08/25/2024   MCHC 32.9 08/25/2024   RDW 14.3 08/25/2024   LYMPHSABS 3.0 08/25/2024   MONOABS 0.6 08/25/2024   EOSABS 0.1 08/25/2024   BASOSABS 0.0 08/25/2024     Last metabolic panel Lab Results  Component Value Date   NA 136 08/26/2024   K 4.3 08/26/2024   CL 104 08/26/2024   CO2 22 08/26/2024   BUN 40 (H) 08/26/2024   CREATININE 2.62 (H) 08/26/2024   GLUCOSE 279 (H) 08/26/2024   GFRNONAA 23 (L) 08/26/2024   GFRAA 40 (L) 10/10/2019   CALCIUM  8.2 (L) 08/26/2024   PHOS 2.9 09/25/2020   PROT 6.2 (L) 08/23/2024   ALBUMIN 3.4 (L) 08/23/2024   LABGLOB 2.2 05/09/2019   AGRATIO 1.6 05/09/2019   BILITOT 0.6 08/23/2024   ALKPHOS 90 08/23/2024   AST 14 (L) 08/23/2024   ALT 16 08/23/2024   ANIONGAP 10 08/26/2024    CBG (last 3)  No results for input(s): GLUCAP in the last 72 hours.     Coagulation Profile: Recent Labs  Lab 08/23/24 0708  INR 1.1     Radiology Studies: No results found.      Elgie Butter M.D. [facility] 08/26/2024, 1:48 PM  Available via Epic secure chat 7am-7pm After 7 pm, please refer to night coverage provider listed on amion.

## 2024-08-26 NOTE — Care Management Important Message (Signed)
 Important Message  Patient Details  Name: Erik Moss MRN: [id number] Date of Birth: [birth year]   Important Message Given:  Yes - Medicare IM     [name] 08/26/2024, 4:01 PM

## 2024-08-27 DIAGNOSIS — G9340 Encephalopathy, unspecified: Secondary | ICD-10-CM | POA: Diagnosis not present

## 2024-08-27 DIAGNOSIS — N400 Enlarged prostate without lower urinary tract symptoms: Secondary | ICD-10-CM | POA: Diagnosis not present

## 2024-08-27 DIAGNOSIS — I10 Essential (primary) hypertension: Secondary | ICD-10-CM | POA: Diagnosis not present

## 2024-08-27 DIAGNOSIS — E785 Hyperlipidemia, unspecified: Secondary | ICD-10-CM | POA: Diagnosis not present

## 2024-08-27 LAB — BASIC METABOLIC PANEL WITH GFR
Anion gap: 11 (ref 5–15)
BUN: 38 mg/dL — ABNORMAL HIGH (ref 8–23)
CO2: 19 mmol/L — ABNORMAL LOW (ref 22–32)
Calcium: 8.8 mg/dL — ABNORMAL LOW (ref 8.9–10.3)
Chloride: 106 mmol/L (ref 98–111)
Creatinine, Ser: 2.34 mg/dL — ABNORMAL HIGH (ref 0.61–1.24)
GFR, Estimated: 26 mL/min — ABNORMAL LOW (ref >60)
Glucose, Bld: 307 mg/dL — ABNORMAL HIGH (ref 70–99)
Potassium: 4.8 mmol/L (ref 3.5–5.1)
Sodium: 136 mmol/L (ref 135–145)

## 2024-08-27 LAB — VITAMIN B1: Vitamin B1 (Thiamine): 96.1 nmol/L (ref 66.5–200.0)

## 2024-08-27 NOTE — Plan of Care (Signed)
   Problem: Clinical Measurements: Goal: Will remain free from infection Outcome: Adequate for Discharge

## 2024-08-27 NOTE — Progress Notes (Signed)
 Physical Therapy Treatment Patient Details Name: Erik Moss MRN: [id number] DOB: 11-02-1932 Today's Date: 08/27/2024   History of Present Illness Patient is an 89 yo male presenting to the ED with slurred speech, confusion, and LUE weakness on 08/23/24. CTA clear, MRI negative for acute intracranial abnormality. EEG suggestive of encephalopathy. PMH: CVA, HTN, CKD stage IIIb, HLD, DM-2, dementia, BPH.    PT Comments  Pt received in recliner and agreeable to session. Pt demonstrates impaired awareness and problem solving requiring increased cues during session. Pt demonstrates improved transfers and is able to progress ambulation distance with min A +2. Pt demonstrates decreased carryover of cues between trials. Pt able to participate in dynamic balance challenges at the sink with 1 UE support. Pt continues to benefit from PT services to progress toward functional mobility goals.    If plan is discharge home, recommend the following: A lot of help with walking and/or transfers;A lot of help with bathing/dressing/bathroom;Assistance with cooking/housework;Direct supervision/assist for financial management;Direct supervision/assist for medications management;Assist for transportation;Help with stairs or ramp for entrance   Can travel by private vehicle     No  Equipment Recommendations  None recommended by PT    Recommendations for Other Services       Precautions / Restrictions Precautions Precautions: Fall Recall of Precautions/Restrictions: Impaired Restrictions Weight Bearing Restrictions Per Provider Order: No     Mobility  Bed Mobility               General bed mobility comments: OOB in recliner at beginning and end of session.    Transfers Overall transfer level: Needs assistance Equipment used: Rolling walker (2 wheels) Transfers: Sit to/from Stand Sit to Stand: Min assist           General transfer comment: STS from recliner with min A for power up and  steadying    Ambulation/Gait Ambulation/Gait assistance: Min assist, +2 safety/equipment Gait Distance (Feet): 10 Feet (+45) Assistive device: Rolling walker (2 wheels) Gait Pattern/deviations: Trunk flexed, Decreased stride length, Step-through pattern, Knee flexed in stance - left, Shuffle       General Gait Details: Pt demonstrates short, shuffled steps with min A for stability and RW management. Max cues for RW proximity and upright posture. Increased instability during turns   Comptroller Bed    Modified Rankin (Stroke Patients Only)       Balance Overall balance assessment: Needs assistance Sitting-balance support: Bilateral upper extremity supported, Feet supported Sitting balance-Leahy Scale: Fair     Standing balance support: Bilateral upper extremity supported, During functional activity, Reliant on assistive device for balance Standing balance-Leahy Scale: Poor Standing balance comment: pt CGA with cues for safety during static standing at sink, requires UE support statically and dynamically                            Communication Communication Communication: Impaired Factors Affecting Communication: Reduced clarity of speech  Cognition Arousal: Alert Behavior During Therapy: WFL for tasks assessed/performed   PT - Cognitive impairments: No family/caregiver present to determine baseline, Orientation, Awareness, Memory, Attention, Sequencing, Problem solving, Safety/Judgement                         Following commands: Impaired Following commands impaired: Only follows one step commands consistently, Follows multi-step commands inconsistently  Cueing Cueing Techniques: Verbal cues  Exercises      General Comments General comments (skin integrity, edema, etc.): VSS on RA      Pertinent Vitals/Pain Pain Assessment Pain Assessment: No/denies pain     PT Goals (current goals can now  be found in the care plan section) Acute Rehab PT Goals Patient Stated Goal: to go home PT Goal Formulation: With patient Time For Goal Achievement: 09/07/24 Progress towards PT goals: Progressing toward goals    Frequency    Min 2X/week       AM-PAC PT 6 Clicks Mobility   Outcome Measure  Help needed turning from your back to your side while in a flat bed without using bedrails?: A Lot Help needed moving from lying on your back to sitting on the side of a flat bed without using bedrails?: A Lot Help needed moving to and from a bed to a chair (including a wheelchair)?: A Lot Help needed standing up from a chair using your arms (e.g., wheelchair or bedside chair)?: A Little Help needed to walk in hospital room?: A Lot Help needed climbing 3-5 steps with a railing? : Total 6 Click Score: 12    End of Session Equipment Utilized During Treatment: Gait belt Activity Tolerance: Patient tolerated treatment well Patient left: with call bell/phone within reach;in chair;with chair alarm set Nurse Communication: Mobility status PT Visit Diagnosis: Unsteadiness on feet (R26.81);Other abnormalities of gait and mobility (R26.89);Muscle weakness (generalized) (M62.81);History of falling (Z91.81)     Time: 8979-8966 PT Time Calculation (min) (ACUTE ONLY): 13 min  Charges:    $Gait Training: 8-22 mins PT General Charges $$ ACUTE PT VISIT: 1 Visit                     Darryle George, PTA Acute Rehabilitation Services Secure Chat Preferred  [phone]    Darryle George 08/27/2024, 12:46 PM

## 2024-08-27 NOTE — Inpatient Diabetes Management (Signed)
 Inpatient Diabetes Program Recommendations  AACE/ADA: New Consensus Statement on Inpatient Glycemic Control (2015)  Target Ranges:  Prepandial:   less than 140 mg/dL      Peak postprandial:   less than 180 mg/dL (1-2 hours)      Critically ill patients:  140 - 180 mg/dL   Lab Results  Component Value Date   GLUCAP 164 (H) 08/23/2024   HGBA1C 7.8 (H) 08/25/2024    Review of Glycemic Control  Latest Reference Range & Units 08/25/24 03:16 08/26/24 11:35  Glucose 70 - 99 mg/dL 773 (H) 720 (H)    Diabetes history: DM2 Outpatient Diabetes medications: Ozempic weekly, Januvia 25 mg QD Current orders for Inpatient glycemic control: none  Inpatient Diabetes Program Recommendations:   Renal function elevated. Might consider:  Novolog  0-9 unit TID + HS    Thanks, Clotilda Bull RN, MSN, BC-ADM Inpatient Diabetes Coordinator Team Pager [phone] (8a-5p)

## 2024-08-27 NOTE — Progress Notes (Signed)
 [facility] [employer], is a 89 y.o. male, DOB - [birth year], FMW:[id number] Admit date - 08/23/2024    Outpatient Primary MD for the patient is Roanna Ezekiel NOVAK, MD  LOS - 4  days    Brief summary    Erik Moss is a 89 y.o. male with medical history significant of HTN, HLD, DM2, BPH, CKD stage IIIb, dementia, and recent admission in 06/2024 for severe orthostatic hypotension with  syncope for which pt was started on midodrine  p/w acute encephalopathy. As per the daughter patient was recently discharged on MOnday to home , was brought in for confusion, generalized weakness and some speech abnormalities.   Acute neurological event ruled out with a negative MRI.  Patient with no focal deficits. His speech is clear and he is sitting in the chair.  Therapy evaluation recommending SNF.    Assessment & Plan    Acute encephalopathy possibly from worsening dementia vs hypertensive encephalopathy vs dehydration.  MRI brain is negative for acute stroke.  Patient this am, is alert and oriented to person and place No fever or signs of infection.  TSH, vit b12, and folate levels are wnl. Vit b1 level is pending.  Therapy eval ordered recommending SNF.    Mild acute on Stage 3b CKD It appears that his baseline creatinine is around 2.2. Creatinine back to baseline today.  US  renal pending.     Hypertension Bp parameters are well controlled.    Hyperlipidemia Resume lipitor .    Dementia Resume Namenda .    BPH  On flomax .   H/o type 2 DM A1c is 7.8%   Hyperkalemia Lokelma  ordered.  Repeat potassium is wnl.    Estimated body mass index is 22.77 kg/m as calculated from the following:   Height as of this encounter: 5' 6 (1.676 m).   Weight as of this encounter: 64 kg.  Code Status: full code.  DVT Prophylaxis:  SCDs Start: 08/23/24 0934   Level of Care:  Level of care: Telemetry Family Communication: Updated patient's daughter  over the phone.   Disposition Plan:     Remains inpatient appropriate:  pending clinical improvement.   Procedures:  MRI brain.  CXR   Consultants:   Neurology.   Antimicrobials:   Anti-infectives (From admission, onward)    None        Medications  Scheduled Meds:  atorvastatin   40 mg Oral Daily   clopidogrel   75 mg Oral Daily   memantine   10 mg Oral BID   polyethylene glycol  17 g Oral BID   senna-docusate  2 tablet Oral BID   tamsulosin   0.4 mg Oral QHS   Continuous Infusions:  sodium chloride  75 mL/hr at 08/27/24 0231   PRN Meds:.acetaminophen , mouth rinse    Subjective:   Erik Moss was seen and examined today.  No acute events. He is in chair, in good spirits. Oriented to person and place.   Objective:   Vitals:   08/26/24 2340 08/27/24 0407 08/27/24 0805 08/27/24 1125  BP: ROLLEN)  151/69 139/69 (!) 149/84 114/64  Pulse: 67 65 74 84  Resp: 18 16 18 18   Temp: (!) 97.5 F (36.4 C) (!) 97.5 F (36.4 C) 98.6 F (37 C) 98.7 F (37.1 C)  TempSrc: Oral Oral    SpO2: 97% 97% 100% 99%  Weight:      Height:        Intake/Output Summary (Last 24 hours) at 08/27/2024 1140 Last data filed at 08/27/2024 0943 Gross per 24 hour  Intake 2087.83 ml  Output 3400 ml  Net -1312.17 ml   Filed Weights   08/23/24 0700 08/23/24 0742  Weight: 64.4 kg 64 kg     Exam General exam: Appears calm and comfortable  Respiratory system: Clear to auscultation. Respiratory effort normal. Cardiovascular system: S1 & S2 heard, RRR.  Gastrointestinal system: Abdomen is soft bs+ Central nervous system: Alert and oriented.  Extremities: No edema.  Skin: No rashes, Psychiatry:  Mood & affect appropriate.    Data Reviewed:  I have personally reviewed following labs and imaging studies   CBC Lab Results  Component Value Date   WBC 7.3 08/25/2024   RBC 3.80 (L) 08/25/2024   HGB 11.7 (L)  08/25/2024   HCT 35.6 (L) 08/25/2024   MCV 93.7 08/25/2024   MCH 30.8 08/25/2024   PLT 300 08/25/2024   MCHC 32.9 08/25/2024   RDW 14.3 08/25/2024   LYMPHSABS 3.0 08/25/2024   MONOABS 0.6 08/25/2024   EOSABS 0.1 08/25/2024   BASOSABS 0.0 08/25/2024     Last metabolic panel Lab Results  Component Value Date   NA 136 08/26/2024   K 4.3 08/26/2024   CL 104 08/26/2024   CO2 22 08/26/2024   BUN 40 (H) 08/26/2024   CREATININE 2.62 (H) 08/26/2024   GLUCOSE 279 (H) 08/26/2024   GFRNONAA 23 (L) 08/26/2024   GFRAA 40 (L) 10/10/2019   CALCIUM  8.2 (L) 08/26/2024   PHOS 2.9 09/25/2020   PROT 6.2 (L) 08/23/2024   ALBUMIN 3.4 (L) 08/23/2024   LABGLOB 2.2 05/09/2019   AGRATIO 1.6 05/09/2019   BILITOT 0.6 08/23/2024   ALKPHOS 90 08/23/2024   AST 14 (L) 08/23/2024   ALT 16 08/23/2024   ANIONGAP 10 08/26/2024    CBG (last 3)  No results for input(s): GLUCAP in the last 72 hours.     Coagulation Profile: Recent Labs  Lab 08/23/24 0708  INR 1.1     Radiology Studies: No results found.      Elgie Butter M.D. [facility] 08/27/2024, 11:40 AM  Available via Epic secure chat 7am-7pm After 7 pm, please refer to night coverage provider listed on amion.

## 2024-08-27 NOTE — Progress Notes (Signed)
 Per lab tech, green tube is hemolyzed and will need a recollect.

## 2024-08-27 NOTE — Progress Notes (Signed)
 Occupational Therapy Treatment Patient Details Name: Erik Moss MRN: [id number] DOB: October 18, 1933 Today's Date: 08/27/2024   History of present illness Patient is an 89 yo male presenting to the ED with slurred speech, confusion, and LUE weakness on 08/23/24. CTA clear, MRI negative for acute intracranial abnormality. EEG suggestive of encephalopathy. PMH: CVA, HTN, CKD stage IIIb, HLD, DM-2, dementia, BPH.   OT comments  Pt progressing toward established OT goals. Pt continues with decreased awareness of current physical/balance deficits but balance improving overall with pt needing min A for dynamic balance tasks this session, reaching outside BOS at sink. Pt additionally needing repeated cues for safety with STS and descent back to chair. Performed blocked practice to optimize carryover. Will continue to follow.       If plan is discharge home, recommend the following:  Two people to help with walking and/or transfers;A lot of help with bathing/dressing/bathroom;Assistance with cooking/housework;Direct supervision/assist for medications management;Direct supervision/assist for financial management;Assist for transportation;Help with stairs or ramp for entrance;Supervision due to cognitive status   Equipment Recommendations  Other (comment)    Recommendations for Other Services      Precautions / Restrictions Precautions Precautions: Fall Recall of Precautions/Restrictions: Impaired Restrictions Weight Bearing Restrictions Per Provider Order: No       Mobility Bed Mobility               General bed mobility comments: OOB in recliner chair with chair alarm on    Transfers Overall transfer level: Needs assistance Equipment used: Rolling walker (2 wheels) Transfers: Sit to/from Stand Sit to Stand: Min assist           General transfer comment: for power up and steady.     Balance Overall balance assessment: Needs assistance Sitting-balance support: Bilateral  upper extremity supported, Feet supported Sitting balance-Leahy Scale: Fair     Standing balance support: Bilateral upper extremity supported, During functional activity, Reliant on assistive device for balance Standing balance-Leahy Scale: Poor Standing balance comment: pt CGA with cues for safety during static standing at sink, requires UE support statically and dynamically                           ADL either performed or assessed with clinical judgement   ADL Overall ADL's : Needs assistance/impaired     Grooming: Contact guard assist;Minimal assistance;Standing;Oral care Grooming Details (indicate cue type and reason): cues for optimal use of environment (turns on water where it is splattering everywhere, needing cues to reduce pressure)                 Toilet Transfer: Minimal assistance;+2 for safety/equipment;Ambulation;Rolling walker (2 wheels) Toilet Transfer Details (indicate cue type and reason): chair follow for safety         Functional mobility during ADLs: Minimal assistance;+2 for safety/equipment;Rolling walker (2 wheels)      Extremity/Trunk Assessment Upper Extremity Assessment Upper Extremity Assessment: Generalized weakness   Lower Extremity Assessment Lower Extremity Assessment: Defer to PT evaluation        Vision   Vision Assessment?: No apparent visual deficits Additional Comments: increased time to locate items at sink for scanning   Perception Perception Perception: Not tested   Praxis Praxis Praxis: Not tested   Communication Communication Communication: Impaired Factors Affecting Communication: Reduced clarity of speech   Cognition Arousal: Alert Behavior During Therapy: WFL for tasks assessed/performed Cognition: History of cognitive impairments, Difficult to assess  OT - Cognition Comments: Dementia at baseline, converses and answers questions, unsure of accuracy of responses. pt needing  facilitation of errorless learning approcahes to optimize carryover                 Following commands: Impaired Following commands impaired: Only follows one step commands consistently, Follows multi-step commands inconsistently      Cueing   Cueing Techniques: Verbal cues  Exercises      Shoulder Instructions       General Comments VSS on RA    Pertinent Vitals/ Pain       Pain Assessment Pain Assessment: No/denies pain  Home Living                                          Prior Functioning/Environment              Frequency  Min 2X/week        Progress Toward Goals  OT Goals(current goals can now be found in the care plan section)  Progress towards OT goals: Progressing toward goals  Acute Rehab OT Goals Time For Goal Achievement: 09/07/24 Potential to Achieve Goals: Good ADL Goals Pt Will Perform Lower Body Bathing: with contact guard assist;sitting/lateral leans;sit to/from stand Pt Will Perform Lower Body Dressing: with contact guard assist;sitting/lateral leans;sit to/from stand Pt Will Transfer to Toilet: with min assist;ambulating;regular height toilet Pt Will Perform Toileting - Clothing Manipulation and hygiene: with min assist;sitting/lateral leans;sit to/from stand Additional ADL Goal #1: Patient will be able to demonstrate increased online awareness in standing to prevent LOB without cues to promote functional independence.  Plan      Co-evaluation                 AM-PAC OT 6 Clicks Daily Activity     Outcome Measure   Help from another person eating meals?: A Little Help from another person taking care of personal grooming?: A Little Help from another person toileting, which includes using toliet, bedpan, or urinal?: A Lot Help from another person bathing (including washing, rinsing, drying)?: A Lot Help from another person to put on and taking off regular upper body clothing?: A Little Help from another  person to put on and taking off regular lower body clothing?: A Lot 6 Click Score: 15    End of Session Equipment Utilized During Treatment: Gait belt;Rolling walker (2 wheels)  OT Visit Diagnosis: Unsteadiness on feet (R26.81);Other abnormalities of gait and mobility (R26.89);Muscle weakness (generalized) (M62.81);History of falling (Z91.81);Other symptoms and signs involving cognitive function   Activity Tolerance Patient tolerated treatment well   Patient Left in chair;with call bell/phone within reach;with chair alarm set   Nurse Communication Mobility status;Other (comment)        Time: 8988-8974 OT Time Calculation (min): 14 min  Charges: OT General Charges $OT Visit: 1 Visit OT Treatments $Self Care/Home Management : 8-22 mins  Elma JONETTA Lebron FREDERICK, OTR/L [facility] Acute Rehabilitation Office: [phone]   Elma JONETTA Lebron 08/27/2024, 11:38 AM

## 2024-08-28 DIAGNOSIS — G9340 Encephalopathy, unspecified: Secondary | ICD-10-CM | POA: Diagnosis not present

## 2024-08-28 LAB — GLUCOSE, CAPILLARY: Glucose-Capillary: 293 mg/dL — ABNORMAL HIGH (ref 70–99)

## 2024-08-28 MED ORDER — INSULIN ASPART 100 UNIT/ML IJ SOLN: 0.0000 [IU] | Freq: Three times a day (TID) | INTRAMUSCULAR | Status: DC

## 2024-08-28 MED ORDER — INSULIN ASPART 100 UNIT/ML IJ SOLN: 0.0000 [IU] | Freq: Every day | INTRAMUSCULAR | Status: DC

## 2024-08-28 NOTE — Plan of Care (Signed)

## 2024-08-28 NOTE — Plan of Care (Signed)

## 2024-08-28 NOTE — TOC Progression Note (Signed)
 Transition of Care [facility]) - Progression Note    Patient Details  Name: Ridwan Bondy Streat MRN: [id number] Date of Birth: 1933/09/01  [facility]) CM/SW Contact  Almarie CHRISTELLA Goodie, [place] Phone Number: 08/28/2024, 10:08 AM  Clinical Narrative:   CSW coordinated with PT/OT assigned for updated notes for insurance authorization, and sent request to CMA for authorization. Patient was approved at the end of the day, but [facility] would need summary by 4:30 to admit today and there isn't enough time. [facility] can accept tomorrow. CSW to follow.    Expected Discharge Plan: Skilled Nursing Facility Barriers to Discharge: Continued Medical Work up, English As A Second Language Teacher               Expected Discharge Plan and Services     Post Acute Care Choice: Skilled Nursing Facility Living arrangements for the past 2 months: Single Family Home                                       Social Drivers of Health (SDOH) Interventions SDOH Screenings   Food Insecurity: Unknown (08/23/2024)  Housing: Unknown (08/23/2024)  Transportation Needs: Unknown (08/23/2024)  Utilities: Patient Unable To Answer (08/23/2024)  Depression (PHQ2-9): Low Risk  (11/19/2019)  Social Connections: Unknown (08/23/2024)  Recent Concern: Social Connections - Socially Isolated (06/23/2024)  Tobacco Use: Low Risk  (08/23/2024)    Readmission Risk Interventions     No data to display

## 2024-08-28 NOTE — TOC Transition Note (Signed)
 Transition of Care [facility]) - Discharge Note   Patient Details  Name: Erik Moss MRN: [id number] Date of Birth: February 13, 1933  Transition of Care [facility]) CM/SW Contact:  Erik CHRISTELLA Goodie, LCSW Phone Number: 08/28/2024, 11:54 AM   Clinical Narrative:   CSW updated MD about bed availability today, patient is medically stable. CSW sent discharge information to [facility], confirmed receipt. CSW spoke with daughter, Vina, she is in agreement. Transport arranged with [facility] for next available.  Nurse to call report to [phone], Room 405A.    Final next level of care: Skilled Nursing Facility Barriers to Discharge: Barriers Resolved   Patient Goals and CMS Choice Patient states their goals for this hospitalization and ongoing recovery are:: patient unable to participate in goal setting, not fully oriented CMS Medicare.gov Compare Post Acute Care list provided to:: Patient Represenative (must comment) Choice offered to / list presented to : Adult Children [facility] ownership interest in [facility].provided to:: Adult Children    Discharge Placement              Patient chooses bed at: [facility] Patient to be transferred to facility by: [facility] Name of family member notified: Vina Patient and family notified of of transfer: 08/28/24  Discharge Plan and Services Additional resources added to the After Visit Summary for       Post Acute Care Choice: Skilled Nursing Facility                               Social Drivers of Health (SDOH) Interventions SDOH Screenings   Food Insecurity: Unknown (08/23/2024)  Housing: Unknown (08/23/2024)  Transportation Needs: Unknown (08/23/2024)  Utilities: Patient Unable To Answer (08/23/2024)  Depression (PHQ2-9): Low Risk  (11/19/2019)  Social Connections: Unknown (08/23/2024)  Recent Concern: Social Connections - Socially Isolated (06/23/2024)  Tobacco Use: Low Risk  (08/23/2024)     Readmission Risk  Interventions     No data to display

## 2024-08-28 NOTE — Discharge Summary (Signed)
 Physician Discharge Summary  Erik Moss Paulette FMW:[id number] DOB: [birth year] DOA: 08/23/2024  PCP: Roanna Ezekiel NOVAK, MD  Admit date: 08/23/2024 Discharge date: 08/28/2024 30 Day Unplanned Readmission Risk Score    Flowsheet Row ED to Hosp-Admission (Current) from 08/23/2024 in [facility]  30 Day Unplanned Readmission Risk Score (%) 20.46 Filed at 08/28/2024 0801    This score is the patient's risk of an unplanned readmission within 30 days of being discharged (0 -100%). The score is based on dignosis, age, lab data, medications, orders, and past utilization.   Low:  0-14.9   Medium: 15-21.9   High: 22-29.9   Extreme: 30 and above          Admitted From: Home Disposition: SNF  Recommendations for Outpatient Follow-up:  Follow up with PCP in 1-2 weeks Please obtain BMP/CBC in one week Please follow up with your PCP on the following pending results: Unresulted Labs (From admission, onward)    None         Home Health: None Equipment/Devices: None  Discharge Condition: Stable CODE STATUS: Full code Diet recommendation:  Diet Order             Diet regular Room service appropriate? Yes with Assist; Fluid consistency: Thin  Diet effective now                   Subjective: Seen and examined, he is fully alert and oriented, he has no complaints at all.  He is in agreement with discharging to SNF.  Brief/Interim Summary: Erik Moss is a 89 y.o. male with medical history significant of HTN, HLD, DM2, BPH, CKD stage IIIb, dementia, and recent admission in 06/2024 for severe orthostatic hypotension with  syncope for which pt was started on midodrine  as well as amlodipine  5 mg p/w acute encephalopathy and generalized weakness.  He was tested negative for stroke based on the MRI.  Blood pressure was elevated, acute encephalopathy was presumed to be secondary to either dementia or hypertensive encephalopathy versus dehydration.  Patient received IV  hydration.  Did not have any signs or symptoms of infection, remained afebrile.  Folate, B1, B12 and TSH were within normal range.  Patient's midodrine  was held as well as amlodipine .  Blood pressure remained slightly elevated.  No orthostatics in the chart.  Patient is fully alert and oriented since yesterday.  Seen by PT OT who recommended SNF which has been arranged for him and he is going to be discharged in stable condition to SNF today.  Due to elevated blood pressure, discontinue midodrine  as we have been doing care and resuming back on amlodipine  5 mg.    Mild acute on Stage 3b CKD It appears that his baseline creatinine is around 2.2. Creatinine back to baseline today.  US  renal negative for acute pathology, indicates medical renal disease.   Hyperlipidemia Resume lipitor .    Dementia Resume Namenda .   BPH  On flomax .    H/o type 2 DM A1c is 7.8%, resume home medications.  Follow-up with PCP.  Discharge plan was discussed with patient and/or family member and they verbalized understanding and agreed with it.  Discharge Diagnoses:  Principal Problem:   Acute encephalopathy Active Problems:   Type 2 diabetes mellitus (HCC)   Hyperlipidemia   BPH (benign prostatic hyperplasia)   Hypertension    Discharge Instructions   Allergies as of 08/28/2024   No Known Allergies      Medication List  STOP taking these medications    ezetimibe 10 MG tablet Commonly known as: ZETIA   midodrine  5 MG tablet Commonly known as: PROAMATINE        TAKE these medications    amLODipine  5 MG tablet Commonly known as: NORVASC  Take only if BP is over 180 systolic   atorvastatin  40 MG tablet Commonly known as: Lipitor  Take 1 tablet (40 mg total) by mouth daily.   clopidogrel  75 MG tablet Commonly known as: PLAVIX  Take 75 mg by mouth daily.   D3 PO Take 1 capsule by mouth daily.   IRON  PO Take 1 tablet by mouth every other day.   memantine  10 MG tablet Commonly  known as: NAMENDA  TAKE ONE TABLET BY MOUTH TWICE DAILY   Ozempic (0.25 or 0.5 MG/DOSE) 2 MG/3ML Sopn Generic drug: Semaglutide(0.25 or 0.5MG /DOS) Inject 0.25 mLs into the skin every 7 (seven) days.   polyethylene glycol 17 g packet Commonly known as: MIRALAX  / GLYCOLAX  Take 17 g by mouth daily.   senna 8.6 MG tablet Commonly known as: SENOKOT Take 1 tablet by mouth daily.   sitaGLIPtin 25 MG tablet Commonly known as: JANUVIA Take 25 mg by mouth daily.   sodium bicarbonate  650 MG tablet Take 650 mg by mouth 2 (two) times daily.   tamsulosin  0.4 MG Caps capsule Commonly known as: FLOMAX  Take 0.4 mg by mouth at bedtime.        Follow-up Information     Bakare, Mobolaji B, MD Follow up in 1 week(s).   Specialty: Internal Medicine Contact information: [address] [place] [address] [phone]                No Known Allergies  Consultations: None   Procedures/Studies: US  RENAL Result Date: 08/27/2024 CLINICAL DATA:  Acute kidney injury. EXAM: RENAL / URINARY TRACT ULTRASOUND COMPLETE COMPARISON:  12/31/2019. FINDINGS: Right Kidney: Renal measurements: 7.9 x 4.8 x 4.4 cm = volume: 87.7 mL. Grossly stable diffusely increased echotexture. Multiple cysts are again demonstrated including adjacent 3.8 x 2.6 x 2.6 cm and 3.3 x 3.2 x 2.8 cm anterior cysts containing thin and moderately thickened internal septations with no internal blood flow on the submitted images. The 3.8 cm cyst previously measured 2.5 cm in maximum diameter. The adjacent 3.3 cm cyst is not clearly visualized on the previous examination, measuring 1.5 cm on 1 image. Previously demonstrated 2.4 x 2.2 x 1.6 cm more posterior right renal cyst with diffuse internal echoes is included in the sagittal plane today, measuring 2.2 x 1.6 cm. No hydronephrosis. Left Kidney: Renal measurements: 9.1 x 5.3 x 3.8 cm = volume: 96.8 mL. No gross change in diffusely increased echotexture. Small  grossly simple cysts not requiring imaging follow-up. No hydronephrosis. Bladder: Enlarged prostate gland with protrusion of the median lobe into the base of the bladder. Mild bladder wall thickening. Other: Enlarged prostate gland. IMPRESSION: 1. No hydronephrosis. 2. Grossly stable diffusely increased renal echotexture bilaterally, consistent with medical renal disease. 3. One stable and 2 enlarging complicated right renal cysts. These do not require imaging follow-up unless clinically indicated. 4. Moderately enlarged prostate gland with protrusion of the median lobe into the base of the bladder. 5. Mild bladder wall thickening, likely due to chronic bladder outlet obstruction by the enlarged prostate gland. Electronically Signed   By: Elspeth Bathe M.D.   On: 08/27/2024 15:51   EEG adult Result Date: 08/23/2024 Shelton Arlin KIDD, MD     08/23/2024 11:28 AM Patient  Name: Yidel Teuscher Cervi MRN: [id number] Epilepsy Attending: Arlin MALVA Krebs Referring Physician/Provider: Remi Pippin, NP Date: 08/23/2024 Duration: 22.40 mins Patient history:  89 y.o. male with hx of prior stroke with no residual deficits, dementia, DM, HTN, HLD, who was brought in as a code stroke for slurred speech, confusion and b/l LE weakness. EEG to evaluate for seizure Level of alertness: sleep/ lethargic AEDs during EEG study: Ativan Technical aspects: This EEG study was done with scalp electrodes positioned according to the 10-20 International system of electrode placement. Electrical activity was reviewed with band pass filter of 1-70Hz , sensitivity of 7 uV/mm, display speed of 69mm/sec with a 60Hz  notched filter applied as appropriate. EEG data were recorded continuously and digitally stored.  Video monitoring was available and reviewed as appropriate. Description: Sleep was characterized by vertex waves, sleep spindles (12 to 14 Hz), maximal frontocentral region.  EEG showed continuous generalized 3 to 6 Hz theta-delta slowing.  Hyperventilation and photic stimulation were not performed.   ABNORMALITY - Continuous slow, generalized IMPRESSION: This study is suggestive of generalized cerebral dysfunction (encephalopathy). No seizures or epileptiform discharges were seen throughout the recording. Arlin MALVA Krebs   MR BRAIN WO CONTRAST Result Date: 08/23/2024 EXAM: MRI BRAIN WITHOUT CONTRAST 08/23/2024 08:47:10 AM TECHNIQUE: Multiplanar multisequence MRI of the head/brain was performed without the administration of intravenous contrast. COMPARISON: MRI head 06/23/2024. CT head from earlier today. CLINICAL HISTORY: Neuro deficit, acute, stroke suspected FINDINGS: BRAIN AND VENTRICLES: No acute infarct. No intracranial hemorrhage. No mass. No midline shift. No hydrocephalus. Mild for age patchy white matter T2/FLAIR hyperintensities, compatible with chronic microvascular ischemic disease. Cerebral atrophy. Normal flow voids. ORBITS: No acute abnormality. SINUSES AND MASTOIDS: Opacified posterior right ethmoid air cell. Otherwise, clear sinus. No mastoid effusions. BONES AND SOFT TISSUES: Normal marrow signal. No acute soft tissue abnormality. IMPRESSION: 1. No acute intracranial abnormality. Electronically signed by: Gilmore Molt MD 08/23/2024 08:58 AM EDT RP Workstation: [id number]   DG CHEST PORT 1 VIEW Result Date: 08/23/2024 CLINICAL DATA:  Acute stroke. EXAM: PORTABLE CHEST 1 VIEW COMPARISON:  None Available. FINDINGS: The heart size and mediastinal contours are within normal limits. Low lung volumes with mild bibasilar atelectasis noted. The lungs are otherwise clear. The visualized skeletal structures are unremarkable. IMPRESSION: Low lung volumes with mild bibasilar atelectasis. Electronically Signed   By: Norleen DELENA Kil M.D.   On: 08/23/2024 08:33   CT ANGIO HEAD NECK W WO CM W PERF (CODE STROKE) Result Date: 08/23/2024 EXAM: CT BRAIN PERFUSION 08/23/2024 07:21:01 AM TECHNIQUE: Cerebral perfusion analysis using computed  tomography with contrast administration, including post-processing of parametric maps with determination of cerebral blood flow, cerebral blood volume, mean transit time and time-to-maximum. Automated exposure control, iterative reconstruction, and/or weight based adjustment of the mA/kV was utilized to reduce the radiation dose to as low as reasonably achievable. COMPARISON: CT of the head dated 08/23/2024. CLINICAL HISTORY: Neuro deficit, acute, stroke suspected. FINDINGS: CT PERFUSION: The CT perfusion study is normal. EXAM QUALITY: The examination is adequate with diagnostic perfusion maps. No significant motion artifact. Appropriate arterial inflow and venous outflow curves. CORE INFARCT (CBF<30% volume): 0 mL TOTAL HYPOPERFUSION (Tmax>6s volume): 0 mL PENUMBRA: Mismatch volume: 0 mL Mismatch ratio: Not applicable Location: Not applicable VASCULATURE: There is mild calcific plaque within the aortic arch. There is mild calcific plaque within the carotid bulbs bilaterally. There is moderate calcific plaque present within the origin of the left internal carotid artery. There is a pseudoaneurysm along the posterior wall just beyond  the origin, measuring approximately 5 x 4 x 8 mm. There is mild-to-moderate calcific plaque within the proximal right internal carotid artery, with less than 20% luminal stenosis. There is extensive calcific plaque within the carotid siphons bilaterally with estimated stenosis of 50 to 60%. There is also moderate stenosis of the supraclinoid segments of the internal carotid arteries, worse on the right, approximately 60 to 70%. There is an outpouching along the posterior wall of the supraclinoid segment of the left internal carotid artery seen on image 106 of series 6, measuring approximately 1 mm, which may represent an infundibulum or aneurysm. The right posterior communicating artery is diminutive. The anterior and middle cerebral arteries are patent and normal in caliber. The  vertebral basilar system is grossly unremarkable. The right vertebral artery is dominant. Please note that the above findings were communicated to Dr. Voncile at approximately 07:32 AM 08/23/2024. IMPRESSION: 1. No evidence of ischemia by CT brain perfusion. 2. 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 3. Moderate stenosis of the supraclinoid segments of the internal carotid arteries, worse on the right, approximately 60 to 70%. 4. Extensive calcific plaque within the carotid siphons bilaterally with estimated stenosis of 50 to 60%. 5. Outpouching along the posterior wall of the supraclinoid segment of the left internal carotid artery, measuring approximately 1 mm, which may represent an infundibulum or aneurysm. Electronically signed by: Evalene Coho MD 08/23/2024 07:43 AM EDT RP Workstation: [id number]   CT HEAD CODE STROKE WO CONTRAST Result Date: 08/23/2024 EXAM: CT HEAD WITHOUT CONTRAST 08/23/2024 07:10:00 AM TECHNIQUE: CT of the head was performed without the administration of intravenous contrast. Automated exposure control, iterative reconstruction, and/or weight based adjustment of the mA/kV was utilized to reduce the radiation dose to as low as reasonably achievable. COMPARISON: CT of the head dated 07/23/2024. CLINICAL HISTORY: Neuro deficit, acute, stroke suspected. FINDINGS: BRAIN AND VENTRICLES: Age-related atrophy and mild-to-moderate cerebral white matter disease. Calcific atheromatous disease within the carotid siphons and vertebral arteries. No acute hemorrhage. No evidence of acute infarct. No hydrocephalus. No extra-axial collection. No mass effect or midline shift. ORBITS: Patient is status post bilateral lens replacement. No acute abnormality. SINUSES: No acute abnormality. SOFT TISSUES AND SKULL: No acute soft tissue abnormality. No skull fracture. ASPECTS Score: [place] Stroke Program Early  CT Score (ASPECTS) Ganglionic (caudate, IC, lentiform nucleus, insula, M1-M3): 7 Supraganglionic (M4-M6): 3 Total: 10 Findings were communicated to Dr. Voncile at 07:13 AM 08/23/2024. IMPRESSION: 1. No acute intracranial abnormality. 2. Age-related atrophy and mild-to-moderate cerebral white matter disease. 3. Calcific atheromatous disease within the carotid siphons and vertebral arteries. 4. Status post bilateral lens replacement. Electronically signed by: Evalene Coho MD 08/23/2024 07:16 AM EDT RP Workstation: [id number]     Discharge Exam: Vitals:   08/28/24 0003 08/28/24 0430  BP: (!) 183/80 (!) 166/70  Pulse: 86 66  Resp: 18 16  Temp: 98.4 F (36.9 C) 97.6 F (36.4 C)  SpO2: 100% 100%   Vitals:   08/27/24 1503 08/27/24 2013 08/28/24 0003 08/28/24 0430  BP: (!) 151/72 (!) 141/69 (!) 183/80 (!) 166/70  Pulse: 78 81 86 66  Resp: 18 18 18 16   Temp: 98.6 F (37 C) 98.6 F (37 C) 98.4 F (36.9 C) 97.6 F (36.4 C)  TempSrc:  Oral Oral Oral  SpO2: 100% 98% 100% 100%  Weight:      Height:  General: Pt is alert, awake, not in acute distress Cardiovascular: RRR, S1/S2 +, no rubs, no gallops Respiratory: CTA bilaterally, no wheezing, no rhonchi Abdominal: Soft, NT, ND, bowel sounds + Extremities: no edema, no cyanosis    The results of significant diagnostics from this hospitalization (including imaging, microbiology, ancillary and laboratory) are listed below for reference.     Microbiology: No results found for this or any previous visit (from the past 240 hours).   Labs: BNP (last 3 results) Recent Labs    10/06/23 2054  BNP 268.3*   Basic Metabolic Panel: Recent Labs  Lab 08/23/24 0707 08/23/24 0708 08/25/24 0316 08/26/24 1135 08/27/24 1156  NA 139 137 133* 136 136  K 4.2 4.3 5.2* 4.3 4.8  CL 108 104 99 104 106  CO2  --  22 24 22  19*  GLUCOSE 172* 172* 226* 279* 307*  BUN 26* 26* 37* 40* 38*  CREATININE 2.20* 2.21* 2.43* 2.62* 2.34*  CALCIUM   --   8.9 9.1 8.2* 8.8*   Liver Function Tests: Recent Labs  Lab 08/23/24 0708  AST 14*  ALT 16  ALKPHOS 90  BILITOT 0.6  PROT 6.2*  ALBUMIN 3.4*   No results for input(s): LIPASE, AMYLASE in the last 168 hours. No results for input(s): AMMONIA in the last 168 hours. CBC: Recent Labs  Lab 08/23/24 0707 08/23/24 0708 08/25/24 0316  WBC  --  5.9 7.3  NEUTROABS  --  2.5 3.6  HGB 10.2* 9.4* 11.7*  HCT 30.0* 29.6* 35.6*  MCV  --  96.1 93.7  PLT  --  269 300   Cardiac Enzymes: No results for input(s): CKTOTAL, CKMB, CKMBINDEX, TROPONINI in the last 168 hours. BNP: Invalid input(s): POCBNP CBG: Recent Labs  Lab 08/23/24 0700  GLUCAP 164*   D-Dimer No results for input(s): DDIMER in the last 72 hours. Hgb A1c No results for input(s): HGBA1C in the last 72 hours. Lipid Profile No results for input(s): CHOL, HDL, LDLCALC, TRIG, CHOLHDL, LDLDIRECT in the last 72 hours. Thyroid  function studies No results for input(s): TSH, T4TOTAL, T3FREE, THYROIDAB in the last 72 hours.  Invalid input(s): FREET3 Anemia work up No results for input(s): VITAMINB12, FOLATE, FERRITIN, TIBC, IRON , RETICCTPCT in the last 72 hours. Urinalysis    Component Value Date/Time   COLORURINE COLORLESS (A) 08/23/2024 0726   APPEARANCEUR CLEAR 08/23/2024 0726   LABSPEC 1.012 08/23/2024 0726   PHURINE 7.0 08/23/2024 0726   GLUCOSEU 50 (A) 08/23/2024 0726   HGBUR NEGATIVE 08/23/2024 0726   BILIRUBINUR NEGATIVE 08/23/2024 0726   KETONESUR NEGATIVE 08/23/2024 0726   PROTEINUR NEGATIVE 08/23/2024 0726   NITRITE NEGATIVE 08/23/2024 0726   LEUKOCYTESUR NEGATIVE 08/23/2024 0726   Sepsis Labs Recent Labs  Lab 08/23/24 0708 08/25/24 0316  WBC 5.9 7.3   Microbiology No results found for this or any previous visit (from the past 240 hours).  FURTHER DISCHARGE INSTRUCTIONS:   Get Medicines reviewed and adjusted: Please take all your medications  with you for your next visit with your Primary MD   Laboratory/radiological data: Please request your Primary MD to go over all hospital tests and procedure/radiological results at the follow up, please ask your Primary MD to get all Hospital records sent to his/her office.   In some cases, they will be blood work, cultures and biopsy results pending at the time of your discharge. Please request that your primary care M.D. goes through all the records of your hospital data and follows up on these results.  Also Note the following: If you experience worsening of your admission symptoms, develop shortness of breath, life threatening emergency, suicidal or homicidal thoughts you must seek medical attention immediately by calling 911 or calling your MD immediately  if symptoms less severe.   You must read complete instructions/literature along with all the possible adverse reactions/side effects for all the Medicines you take and that have been prescribed to you. Take any new Medicines after you have completely understood and accpet all the possible adverse reactions/side effects.    patient was instructed, not to drive, operate heavy machinery, perform activities at heights, swimming or participation in water activities or provide baby-sitting services while on Pain, Sleep and Anxiety Medications; until their outpatient Physician has advised to do so again. Also recommended to not to take more than prescribed Pain, Sleep and Anxiety Medications.  It is not advisable to combine anxiety, sleep and pain medications without talking with your primary care provider.     Wear Seat belts while driving.   Please note: You were cared for by a hospitalist during your hospital stay. Once you are discharged, your primary care physician will handle any further medical issues. Please note that NO REFILLS for any discharge medications will be authorized once you are discharged, as it is imperative that you return to  your primary care physician (or establish a relationship with a primary care physician if you do not have one) for your post hospital discharge needs so that they can reassess your need for medications and monitor your lab values  Time coordinating discharge: Over 30 minutes  SIGNED:   Fredia Skeeter, MD  [facility] 08/28/2024, 10:18 AM *Please note that this is a verbal dictation therefore any spelling or grammatical errors are due to the Dragon Medical One system interpretation. If 7PM-7AM, please contact night-coverage [email or web address]

## 2024-08-28 NOTE — Progress Notes (Signed)
 Called [facility] three times to give report but no answer, and unable to leave voicemail.

## 2024-08-28 NOTE — Progress Notes (Signed)
 Patient going to [facility], room 405A. Discharge report given to Paulina, RN.

## 2024-09-24 NOTE — Progress Notes (Unsigned)
 [name] [address] [place] [address] [phone]       OFFICE FOLLOW UP NOTE  [name] Date of Birth:  August 07, 1933 Medical Record Number:  [id number]   Reason for visit: hx of stroke, dementia, imbalance    SUBJECTIVE:   CHIEF COMPLAINT:  No chief complaint on file.   HPI:    Update 09/24/2024 JM: Patient returns for follow-up visit after prior visit almost 2 years ago.  He has had multiple hospitalizations since prior visit recently in August/September for severe orthostatic hypotension with syncope and started on midodrine , MRI no acute findings, and most recently in October for generalized weakness, dysarthria and incontinence.  MRI brain without acute findings.  Suspected acute encephalopathy presumed to be secondary to either dementia versus hypertensive encephalopathy versus dehydration.  Midodrine  held with improvement of blood pressures.  Therapies recommended SNF.          Update 12/05/2022 JM: Patient returns for 63-month follow-up accompanied by his daughter.  Stable from stroke standpoint, no new stroke/TIA symptoms.  Remains on Plavix  and atorvastatin .  Blood pressure today 130/67.  Routinely follows with PCP.  Cognition has been stable since prior visit, continues to go to [facility] daily and actively participates in social activities there. 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  MMSE today 19/30 (prior [facility] 12/30 - unable to complete top portion therefore switched to MMSE) Remains on Namenda  10mg  BID, denies side effects  Continued gait impairment/imbalance but overall stable since prior visit.  Has been participating in dancing and exercise classes at [facility] which has been helpful. Was not seen by neurosurgery as previously discussed as stable.     UPDATE 05/26/2022 JM:   He is accompanied today by his daughter, follow-up.  Overall stable since prior visit without any new stroke/TIA symptoms.   Compliant on Plavix  and atorvastatin , denies side effects.  Also reports continuing on aspirin  81 mg daily for stroke prevention purposes Blood pressure 142/77 - does not routinely monitor at home Routinely follows with PCP for stroke risk factor management with recent visit yesterday  Both patient and daughter report cognition has been stable since prior visit Remains on Namenda  10 mg twice daily, denies side effects. Per daughter, was never on Aricept previously  MOCA 12/30 (prior 18/30 11/2021)  MRI of cervical spine showed C4-5 spinal stenosis with possible nerve root compression on the left. Daughter believes his gait continues to gradually decline.  He does try to stay active and ambulates around his yard.  Does not use any assistive device, denies any recent falls.  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  Denies any hand or feet numbness/tingling.  Occasional bowel and bladder incontinence, unchanged since prior visit.  Denies any neck pain.  No further concerns at this time    MRI CERVICAL SPINE 12/11/2021 IMPRESSION: This MRI of the cervical spine without contrast shows the following: 1.   Abnormal signal within the spinal cord at C4 to C4-C5 involving the anterior and posterior horns of the spinal cord and small T2 hyperintense foci to the right and posterolaterally to the right adjacent to C5.  These are consistent with myelopathic signal changes, likely related to the spinal stenosis and anterolisthesis at C4-C5.  Additionally at this level there is moderately severe left foraminal narrowing with potential for left C5 nerve root compression. 2.   At  C3-C4, there are degenerative changes causing mild spinal stenosis and moderate bilateral foraminal narrowing but no definite nerve root compression. 3.   At C5-C6, there are degenerative changes causing mild spinal stenosis  and moderately severe left foraminal narrowing and moderate right foraminal narrowing.  There is potential for left C6 nerve root compression. 4.   At C6-C7, there are degenerative changes causing moderate spinal stenosis and moderate bilateral foraminal narrowing but no definite nerve root compression. 5.   At C7-T1, there is anterolisthesis and other degenerative changes causing mild spinal stenosis and moderate bilateral foraminal narrowing but no definite nerve root compression. 6.   Degenerative changes at C2-C3 and T1-T2 do not lead to spinal stenosis.  There is foraminal narrowing but no definite nerve root compression.  UPDATE Nov 30 2021 Dr. Onita: He lives with his daughter now, still likes to read newspaper, also exercise regularly, his memory continues to decline, MoCA examination is 18/30 today, daughter also reported that he has increased gait abnormality, occasionally bowel and bladder incontinence.   Personally reviewed MRI of brain on Aug 23 2021:1. No evidence of acute intracranial abnormality, including acute infarct.2. Similar mild-to-moderate chronic microvascular disease and remote lacunar infarcts. He is tolerating Aricept 10 mg daily Laboratory evaluations October 2022: CBC showed hemoglobin of 11.4, CMP, creatinine 2.78, negative alcohol, negative UDS    Consult visit 03/22/2021 Dr. Onita: Erik Moss is a 89 year old male, seen in request by his PCP Prentice JONELLE Batch, NP for evaluation of transient confusion with initial evaluation by Dr. Onita on 02/23/2021.   I reviewed and summarized the referring note.  Past medical history Hypertension Hyperlipidemia Diabetes Prostate hypertrophy   Consult visit 02/23/2021 Dr. Onita: He is retired [employer], he retired at age 27s, since then, he completed total of 9 masters degree, involving museum, Chinese history, creative writing English, comptroller, international relationship management,   Prior to 2020, he lives  alone, moved to his daughter during pandemic, was noted to have slow worsening memory loss,    He was admitted to hospital in December 2020, was noted to have acute onset left-sided weakness, dragging his left leg while walking   MRI of the brain on October 08, 2019 showed small region of acute infarction involving right lateral thalamus, posterior limb of internal capsule, was also noted to have poorly controlled diabetes, A1c up to 10.5,   Extensive evaluation showed no significant large vessel disease, no significant abnormality on echocardiogram,   He was started on Plavix  75 mg daily, also taking aspirin  81 mg daily, daughter began to help in managing his medications   He recovered almost back to his baseline, he wished to register for college class, but was denied, he was noted to have worsening memory loss, he still enjoying reading newspaper, walk few miles each day,   His mother lived to more than 50 years old, began to develop mild memory loss in her 21s   In January 26, 2021, woke up in the morning, he was noted by his daughter to be slow reaction, mildly slurred speech, confused, was taken to the emergency room, MRI of the brain showed no acute abnormality        ROS:   14 system review of systems performed and negative with exception of those listed in HPI  PMH:  Past Medical History:  Diagnosis Date   BPH (benign prostatic hyperplasia) 10/08/2019   Diabetes mellitus without complication (HCC)    Hyperlipidemia 10/08/2019   Hypertension  10/08/2019   Renal disease    Stroke [facility])    TIA (transient ischemic attack)    Type 2 diabetes mellitus (HCC) 10/08/2019    PSH:  Past Surgical History:  Procedure Laterality Date   BUBBLE STUDY  10/10/2019   Procedure: BUBBLE STUDY;  Surgeon: Barbaraann Darryle Ned, MD;  Location: [facility] ENDOSCOPY;  Service: Cardiology;;   TEE WITHOUT CARDIOVERSION N/A 10/10/2019   Procedure: TRANSESOPHAGEAL ECHOCARDIOGRAM (TEE);  Surgeon: Barbaraann Darryle Ned, MD;  Location: [facility] ENDOSCOPY;  Service: Cardiology;  Laterality: N/A;    Social History:  Social History   Socioeconomic History   Marital status: Divorced    Spouse name: Not on file   Number of children: 1   Years of education: collge   Highest education level: Master's degree (e.g., MA, MS, MEng, MEd, MSW, MBA)  Occupational History   Occupation: Retired  Tobacco Use   Smoking status: Never   Smokeless tobacco: Never  Substance and Sexual Activity   Alcohol use: Never   Drug use: Never   Sexual activity: Not on file  Other Topics Concern   Not on file  Social History Narrative   11/19/19 lives with dgtr, grand dgtr.   Left-handed.   Reports having nine masters' degrees.   1 cup tea each morning.   Social Drivers of Corporate Investment Banker Strain: Not on file  Food Insecurity: Unknown (08/23/2024)   Hunger Vital Sign    Worried About Running Out of Food in the Last Year: Patient unable to answer    Ran Out of Food in the Last Year: Not on file  Transportation Needs: Unknown (08/23/2024)   PRAPARE - Transportation    Lack of Transportation (Medical): Patient unable to answer    Lack of Transportation (Non-Medical): Not on file  Physical Activity: Not on file  Stress: Not on file  Social Connections: Unknown (08/23/2024)   Social Connection and Isolation Panel    Frequency of Communication with Friends and Family: Patient unable to answer    Frequency of Social Gatherings with Friends and Family: Not on file    Attends Religious Services: Not on file    Active Member of Clubs or Organizations: Not on file    Attends Banker Meetings: Not on file    Marital Status: Not on file  Recent Concern: Social Connections - Socially Isolated (06/23/2024)   Social Connection and Isolation Panel    Frequency of Communication with Friends and Family: Once a week    Frequency of Social Gatherings with Friends and Family: Never    Attends Religious  Services: Never    Database Administrator or Organizations: No    Attends Banker Meetings: Never    Marital Status: Divorced  Catering Manager Violence: Unknown (08/23/2024)   Humiliation, Afraid, Rape, and Kick questionnaire    Fear of Current or Ex-Partner: Patient unable to answer    Emotionally Abused: Not on file    Physically Abused: Not on file    Sexually Abused: Not on file    Family History:  Family History  Problem Relation Age of Onset   Other Mother        Passed from old age   Other Father        Accident    Medications:   Current Outpatient Medications on File Prior to Visit  Medication Sig Dispense Refill   amLODipine  (NORVASC ) 5 MG tablet Take only if BP is over 180 systolic  atorvastatin  (LIPITOR ) 40 MG tablet Take 1 tablet (40 mg total) by mouth daily. 90 tablet 0   Cholecalciferol (D3 PO) Take 1 capsule by mouth daily.     clopidogrel  (PLAVIX ) 75 MG tablet Take 75 mg by mouth daily.     Ferrous Sulfate (IRON  PO) Take 1 tablet by mouth every other day.     memantine  (NAMENDA ) 10 MG tablet TAKE ONE TABLET BY MOUTH TWICE DAILY 180 tablet 0   polyethylene glycol (MIRALAX  / GLYCOLAX ) 17 g packet Take 17 g by mouth daily.     Semaglutide,0.25 or 0.5MG /DOS, (OZEMPIC, 0.25 OR 0.5 MG/DOSE,) 2 MG/3ML SOPN Inject 0.25 mLs into the skin every 7 (seven) days.     senna (SENOKOT) 8.6 MG tablet Take 1 tablet by mouth daily.     sitaGLIPtin (JANUVIA) 25 MG tablet Take 25 mg by mouth daily.     sodium bicarbonate  650 MG tablet Take 650 mg by mouth 2 (two) times daily.     tamsulosin  (FLOMAX ) 0.4 MG CAPS capsule Take 0.4 mg by mouth at bedtime.     No current facility-administered medications on file prior to visit.    Allergies:  No Known Allergies    OBJECTIVE:  Physical Exam  There were no vitals filed for this visit.   There is no height or weight on file to calculate BMI. No results found.   General: well developed, well nourished,  frail very pleasant elderly African-American male, seated, in no evident distress Head: head normocephalic and atraumatic.   Neck: supple with no carotid or supraclavicular bruits Cardiovascular: regular rate and rhythm, no murmurs Musculoskeletal: no deformity Skin:  no rash/petichiae Vascular:  Normal pulses all extremities   Neurologic Exam Mental Status: Awake and fully alert.  Fluent speech and language.  Oriented to place and time. Recent and remote memory intact. Attention span, concentration and fund of knowledge appropriate. Mood and affect appropriate.  Cranial Nerves: Pupils equal, briskly reactive to light. Extraocular movements full without nystagmus. Visual fields full to confrontation.  HOH bilaterally. Facial sensation intact. Face, tongue, palate moves normally and symmetrically.  Motor: Normal bulk and tone. Normal strength in all tested extremity muscles Sensory.: intact to touch , pinprick , position and vibratory sensation.  Coordination: Rapid alternating movements normal in all extremities. Finger-to-nose and heel-to-shin performed accurately bilaterally. Gait and Station: Arises from chair without difficulty. Stance is stooped. Gait demonstrates decreased stride length and step height bilaterally with mild unsteadiness and occasional veering towards the right.  No assistive device. Tandem walk and heel toe not attempted Reflexes: 1+ and symmetric. Toes downgoing.      05/31/2022    7:37 AM 11/30/2021    7:30 AM 02/25/2021    9:00 PM  Montreal Cognitive Assessment   Visuospatial/ Executive (0/5) 0 3 1  Naming (0/3) 0 1 3  Attention: Read list of digits (0/2) 2 2 2   Attention: Read list of letters (0/1) 1 1 1   Attention: Serial 7 subtraction starting at 100 (0/3) 2 3 3   Language: Repeat phrase (0/2) 1 1 2   Language : Fluency (0/1) 1 1 1   Abstraction (0/2) 1 1 2   Delayed Recall (0/5) 0 0 0  Orientation (0/6) 4 5 6   Total 12 18 21   Adjusted Score (based on education)   18       12/05/2022   10:54 AM  MMSE - Mini Mental State Exam  Orientation to time 5  Orientation to Place 3  Registration 3  Attention/ Calculation 1  Recall 0  Language- name 2 objects 2  Language- repeat 1  Language- follow 3 step command 2  Language- read & follow direction 1  Write a sentence 1  Copy design 0  Total score 19          ASSESSMENT/PLAN: Quillan Whitter Scherman is a 89 y.o. year old male    History of stroke involving right posterior limb of internal capsule in December 2020             Vascular risk factor of aging, hypertension, hyperlipidemia, diabetes  Continue Plavix  and atorvastatin  for secondary stroke prevention measures  Continue close PCP follow-up for aggressive stroke risk factor management        Dementia             MMSE 19/30 11/2022 MoCA examination 12/30 05/2022 (prior 18 /30 11/2021)  Subjectively, both patient and daughter believes memory has been stable  Continue Namenda  10 mg twice daily- refill provided   Denies previously being on Aricept - can consider initiating in the future if indicated but will hold off as memory has been nonprogressive  Increase participation with memory exercises such as crossword puzzles, word search and sudoku.  Continue routine exercise and ensure continued healthy diet, adequate sleep and good management of risk factors             Laboratory evaluation showed no treatable etiology           Worsening gait abnormality, incontinence,             stable since prior visit  Continue to participate in exercise and dancing classes at [facility]  MRI of the cervical spine showed multilevel degenerative changes with potential nerve root compression at left C5 and left C6 - can hold off on neurosurgery evaluation at this time as stable but if any progression, would recommend evaluation at that time       Follow up in 1 year or call earlier if needed   CC:  PCP: Roanna Ezekiel NOVAK, MD      Harlene Bogaert,  AGNP-BC  [facility] [address] [place], [place] [address]  Phone [phone] Fax [phone] Note: This document was prepared with digital dictation and possible smart phrase technology. Any transcriptional errors that result from this process are unintentional.

## 2024-09-25 ENCOUNTER — Ambulatory Visit: Admitting: Adult Health

## 2024-09-25 ENCOUNTER — Encounter: Payer: Self-pay | Admitting: Adult Health

## 2024-09-25 ENCOUNTER — Telehealth: Payer: Self-pay | Admitting: *Deleted

## 2024-09-25 VITALS — BP 131/63 | HR 74 | Ht 66.0 in | Wt 139.0 lb

## 2024-09-25 DIAGNOSIS — Z9181 History of falling: Secondary | ICD-10-CM | POA: Diagnosis not present

## 2024-09-25 DIAGNOSIS — F0390 Unspecified dementia without behavioral disturbance: Secondary | ICD-10-CM | POA: Diagnosis not present

## 2024-09-25 DIAGNOSIS — R296 Repeated falls: Secondary | ICD-10-CM

## 2024-09-25 DIAGNOSIS — I639 Cerebral infarction, unspecified: Secondary | ICD-10-CM

## 2024-09-25 DIAGNOSIS — R269 Unspecified abnormalities of gait and mobility: Secondary | ICD-10-CM

## 2024-09-25 MED ORDER — MEMANTINE HCL ER 14 MG PO CP24: 14.0000 mg | ORAL_CAPSULE | Freq: Every evening | ORAL | 11 refills | Status: AC

## 2024-09-25 NOTE — Telephone Encounter (Signed)
   Faxed order to [facility] intake team at [phone]. Received fax confirmation.

## 2024-09-30 NOTE — Telephone Encounter (Signed)
 Notification received from [facility] that they are unable to process the request as they do not provide this item. Order has been faxed to [facility], confirmation received.

## 2024-10-01 NOTE — Telephone Encounter (Signed)
 [facility]) calling to make you aware we do not carry chair lift. Will need to send to another agency.

## 2024-10-02 NOTE — Telephone Encounter (Signed)
 I contacted [facility] as well, they do not carry insurance claims handler. I contacted pt daughter, advised I will mail Rx to them and they can take it to a local DME  of their choice. Daughter stated she don't believe she will need it anyway, she doesn't think she will be able to lift him into the chair to transfer.  Advised I will mail to them just in case she does decide that she may need it.  She verbalized understanding and appreciation.

## 2025-04-16 ENCOUNTER — Ambulatory Visit: Admitting: Adult Health
